# Patient Record
Sex: Female | Born: 1950 | Race: White | Hispanic: No | Marital: Married | Smoking: Former smoker
Health system: Southern US, Community
[De-identification: ages and names within clinical notes are randomized; demographics above are authoritative.]

## PROBLEM LIST (undated history)

## (undated) DIAGNOSIS — I493 Ventricular premature depolarization: Secondary | ICD-10-CM

## (undated) DIAGNOSIS — H1851 Endothelial corneal dystrophy: Secondary | ICD-10-CM

## (undated) DIAGNOSIS — H269 Unspecified cataract: Secondary | ICD-10-CM

## (undated) DIAGNOSIS — H18519 Endothelial corneal dystrophy, unspecified eye: Secondary | ICD-10-CM

## (undated) DIAGNOSIS — E785 Hyperlipidemia, unspecified: Secondary | ICD-10-CM

## (undated) DIAGNOSIS — R011 Cardiac murmur, unspecified: Secondary | ICD-10-CM

## (undated) DIAGNOSIS — T7840XA Allergy, unspecified, initial encounter: Secondary | ICD-10-CM

## (undated) DIAGNOSIS — C801 Malignant (primary) neoplasm, unspecified: Secondary | ICD-10-CM

## (undated) DIAGNOSIS — I4729 Other ventricular tachycardia: Secondary | ICD-10-CM

## (undated) DIAGNOSIS — K219 Gastro-esophageal reflux disease without esophagitis: Secondary | ICD-10-CM

## (undated) DIAGNOSIS — I472 Ventricular tachycardia: Secondary | ICD-10-CM

## (undated) DIAGNOSIS — B009 Herpesviral infection, unspecified: Secondary | ICD-10-CM

## (undated) DIAGNOSIS — I351 Nonrheumatic aortic (valve) insufficiency: Secondary | ICD-10-CM

## (undated) DIAGNOSIS — I341 Nonrheumatic mitral (valve) prolapse: Secondary | ICD-10-CM

## (undated) DIAGNOSIS — Z8659 Personal history of other mental and behavioral disorders: Secondary | ICD-10-CM

## (undated) DIAGNOSIS — K635 Polyp of colon: Secondary | ICD-10-CM

## (undated) DIAGNOSIS — M1990 Unspecified osteoarthritis, unspecified site: Secondary | ICD-10-CM

## (undated) DIAGNOSIS — K5790 Diverticulosis of intestine, part unspecified, without perforation or abscess without bleeding: Secondary | ICD-10-CM

## (undated) DIAGNOSIS — F419 Anxiety disorder, unspecified: Secondary | ICD-10-CM

## (undated) DIAGNOSIS — O24419 Gestational diabetes mellitus in pregnancy, unspecified control: Secondary | ICD-10-CM

## (undated) HISTORY — DX: Nonrheumatic mitral (valve) prolapse: I34.1

## (undated) HISTORY — DX: Other ventricular tachycardia: I47.29

## (undated) HISTORY — PX: POLYPECTOMY: SHX149

## (undated) HISTORY — PX: COLONOSCOPY: SHX174

## (undated) HISTORY — DX: Hyperlipidemia, unspecified: E78.5

## (undated) HISTORY — DX: Unspecified cataract: H26.9

## (undated) HISTORY — PX: WISDOM TOOTH EXTRACTION: SHX21

## (undated) HISTORY — DX: Polyp of colon: K63.5

## (undated) HISTORY — DX: Unspecified osteoarthritis, unspecified site: M19.90

## (undated) HISTORY — DX: Herpesviral infection, unspecified: B00.9

## (undated) HISTORY — DX: Anxiety disorder, unspecified: F41.9

## (undated) HISTORY — DX: Endothelial corneal dystrophy: H18.51

## (undated) HISTORY — DX: Endothelial corneal dystrophy, unspecified eye: H18.519

## (undated) HISTORY — DX: Cardiac murmur, unspecified: R01.1

## (undated) HISTORY — DX: Ventricular premature depolarization: I49.3

## (undated) HISTORY — DX: Malignant (primary) neoplasm, unspecified: C80.1

## (undated) HISTORY — DX: Allergy, unspecified, initial encounter: T78.40XA

## (undated) HISTORY — DX: Gastro-esophageal reflux disease without esophagitis: K21.9

## (undated) HISTORY — DX: Personal history of other mental and behavioral disorders: Z86.59

## (undated) HISTORY — DX: Nonrheumatic aortic (valve) insufficiency: I35.1

## (undated) HISTORY — PX: MANDIBLE SURGERY: SHX707

## (undated) HISTORY — PX: KNEE SURGERY: SHX244

## (undated) HISTORY — DX: Ventricular tachycardia: I47.2

## (undated) HISTORY — DX: Diverticulosis of intestine, part unspecified, without perforation or abscess without bleeding: K57.90

## (undated) HISTORY — DX: Gestational diabetes mellitus in pregnancy, unspecified control: O24.419

---

## 1997-10-28 ENCOUNTER — Other Ambulatory Visit: Admission: RE | Admit: 1997-10-28 | Discharge: 1997-10-28 | Payer: Self-pay | Admitting: Obstetrics and Gynecology

## 2001-02-14 ENCOUNTER — Other Ambulatory Visit: Admission: RE | Admit: 2001-02-14 | Discharge: 2001-02-14 | Payer: Self-pay | Admitting: *Deleted

## 2001-10-30 ENCOUNTER — Encounter: Payer: Self-pay | Admitting: Internal Medicine

## 2002-05-07 ENCOUNTER — Other Ambulatory Visit: Admission: RE | Admit: 2002-05-07 | Discharge: 2002-05-07 | Payer: Self-pay | Admitting: Internal Medicine

## 2004-05-21 ENCOUNTER — Other Ambulatory Visit: Admission: RE | Admit: 2004-05-21 | Discharge: 2004-05-21 | Payer: Self-pay | Admitting: Internal Medicine

## 2006-07-25 ENCOUNTER — Other Ambulatory Visit: Admission: RE | Admit: 2006-07-25 | Discharge: 2006-07-25 | Payer: Self-pay | Admitting: Internal Medicine

## 2007-03-06 ENCOUNTER — Ambulatory Visit: Payer: Self-pay | Admitting: Internal Medicine

## 2007-11-20 ENCOUNTER — Ambulatory Visit (HOSPITAL_COMMUNITY): Admission: RE | Admit: 2007-11-20 | Discharge: 2007-11-20 | Payer: Self-pay | Admitting: Internal Medicine

## 2008-02-28 ENCOUNTER — Ambulatory Visit: Payer: Self-pay | Admitting: Internal Medicine

## 2008-03-13 ENCOUNTER — Encounter: Payer: Self-pay | Admitting: Internal Medicine

## 2008-03-13 ENCOUNTER — Ambulatory Visit: Payer: Self-pay | Admitting: Internal Medicine

## 2008-03-14 ENCOUNTER — Encounter: Payer: Self-pay | Admitting: Internal Medicine

## 2008-08-22 ENCOUNTER — Other Ambulatory Visit: Admission: RE | Admit: 2008-08-22 | Discharge: 2008-08-22 | Payer: Self-pay | Admitting: Internal Medicine

## 2009-09-30 ENCOUNTER — Encounter: Payer: Self-pay | Admitting: Internal Medicine

## 2009-10-01 ENCOUNTER — Ambulatory Visit (HOSPITAL_COMMUNITY): Admission: RE | Admit: 2009-10-01 | Discharge: 2009-10-01 | Payer: Self-pay | Admitting: Internal Medicine

## 2009-11-14 DIAGNOSIS — Z860100 Personal history of colon polyps, unspecified: Secondary | ICD-10-CM | POA: Insufficient documentation

## 2009-11-14 DIAGNOSIS — R1011 Right upper quadrant pain: Secondary | ICD-10-CM | POA: Insufficient documentation

## 2009-11-14 DIAGNOSIS — K5730 Diverticulosis of large intestine without perforation or abscess without bleeding: Secondary | ICD-10-CM | POA: Insufficient documentation

## 2009-11-19 ENCOUNTER — Ambulatory Visit: Payer: Self-pay | Admitting: Internal Medicine

## 2009-11-21 ENCOUNTER — Ambulatory Visit (HOSPITAL_COMMUNITY): Admission: RE | Admit: 2009-11-21 | Discharge: 2009-11-21 | Payer: Self-pay | Admitting: Internal Medicine

## 2009-11-26 ENCOUNTER — Telehealth: Payer: Self-pay | Admitting: Internal Medicine

## 2010-01-04 ENCOUNTER — Emergency Department (HOSPITAL_COMMUNITY): Admission: EM | Admit: 2010-01-04 | Discharge: 2010-01-04 | Payer: Self-pay | Admitting: Emergency Medicine

## 2010-02-06 ENCOUNTER — Ambulatory Visit: Payer: Self-pay | Admitting: Cardiovascular Disease

## 2010-04-02 ENCOUNTER — Ambulatory Visit: Payer: Self-pay | Admitting: Cardiovascular Disease

## 2010-07-16 NOTE — Progress Notes (Signed)
Summary: Triage-abd.pain   Phone Note Call from Patient Call back at Home Phone [phone]   Caller: Patient Call For: Dr. Juanda Chance Reason for Call: Talk to Nurse Summary of Call: pt. said over the weekend she was constipated. now it is diarrhea with abd. pain Initial call taken by: Karna Christmas,  November 26, 2009 10:48 AM  Follow-up for Phone Call        Last OV 11-19-09.  Pt. states she began with abd. pain on Sunday. Pain is "Almost to my hip bone on the left side."  Pt. took an ASA, was concerned the pain was connected to her heart. When she pushes the area the pain is worse. This morning the pain was worse and she had urge to have a BM, the stool was watery X3. "The pain feels like it is moving down my intestine track."  Denies fever, blood, black stools.   Stools "Are not a normal color for me, they are sorta yellow."  Pt. also feels she is having heart palpitations, she has been instructed to call her PCP/Cardiologist ASAP about heart concerns.  DR.Exander Shaul PLEASE ADVISE   Follow-up by: Laureen Ochs LPN,  November 26, 2009 11:39 AM  Additional Follow-up for Phone Call Additional follow up Details #1::        Please set her up for CT scan of the abd/pelvis with oral and IV contrast attn: RLQ abd.pain. Additional Follow-up by: Hart Carwin MD,  November 26, 2009 1:13 PM    Additional Follow-up for Phone Call Additional follow up Details #2::    Above MD orders reviewed with patient. Pt. declines the CT, states, "I have diarrhea now, I think I just have a virus, just see what Dr.Alexus Galka thinks."   I have instructed pt. to stay on full liquids for 24 hours and then slowly advance her diet and use Immodium as needed for diarrhea. I will call pt., if new orders, after MD reviews.  DR.Zen Felling PLEASE ADVISE  Follow-up by: Laureen Ochs LPN,  November 26, 2009 3:04 PM  Additional Follow-up for Phone Call Additional follow up Details #3:: Details for Additional Follow-up Action Taken: It is OK for  her to hold off on CT scan if she is better. Call us as needed. Additional Follow-up by: Hart Carwin MD,  November 27, 2009 8:16 AM   Above MD orders reviewed with patient. Pt. states she began with respiratory symptoms last night, she spoke with her PCP and she will call us back as needed. Laureen Ochs LPN  November 27, 2009 8:29 AM

## 2010-07-16 NOTE — Assessment & Plan Note (Signed)
Summary: Gastroenterology  Birdie Hopes MR#:  [id number] Page #  [facility]   GASTROENTEROLOGY CONSULTATION  NAME:  Jean Smith, Jean Smith  OFFICE NO:  [id number]  DATE:  10/30/01   REQUESTING PHYSICIAN:  Dr. Marisue Brooklyn  PROBLEM:   1.   Family history of colon cancer. 2.   Acute diarrheal illness.  HISTORY OF PRESENT ILLNESS:  The patient is a pleasant, generally healthy white female who initially was referred for screening due to family history of colon cancer in her mother.  The patient says that her mother was diagnosed in her early 62s.  The patient had not been having any changes in her bowel habits, problems with diarrhea or constipation, melena or hematochezia.  Over the past 12 days the patient has been acutely ill with diarrhea and lower abdominal cramping.  She says at her worst, she was having up to 12 bowel movements per day and is currently having 2 to 3 bowel movements per day.  She has not noticed any melena or hematochezia.  No fever or chills and again, no nausea or vomiting.  She says she is worrying about some sort of organism, as two of her children have also been sick and they had all eaten a chicken salad from a restaurant on a couple of occasions.  She says her son is currently home from school today, sick with diarrhea and severe cramping.  She has been taking Imodium on a p.r.n. basis and feels that she has actually been better over the past few days.  She is keeping down solid food.  She has not had any significant weight loss with this illness and says that her appetite is okay.  She has city water at home.  No foreign travel and had last taken an antibiotic in March, a Z-Pak for bronchitis.  CURRENT MEDICATIONS:  Wellbutrin 150 q.d. and topical Flagyl for rosacea.  PAST MEDICAL HISTORY:  C-section x 2, right knee surgery and a right jaw repair for TMJ.    SOCIAL HISTORY:  The patient is a Psychologist, sport and exercise.  She is married and has three children.  She is a nonsmoker.   Drinks alcohol rarely.  FAMILY HISTORY:  As outlined above.  Pertinent for colon cancer in the patient's mother, diagnosed in her early 40s.  Maternal grandmother also died from cancer with liver mets, they are not sure that she may have had colon cancer as her primary.  Father with history of heart disease.  REVIEW OF SYSTEMS:  Reviewed and negative other than described above.  PHYSICAL EXAMINATION:  GENERAL:  A well-developed, healthy-appearing white female in no acute distress.  VITAL SIGNS:  Blood pressure 110/80.  Weight is 138 pounds.  CARDIOVASCULAR:  Regular rate and rhythm with S1 and S2.  No murmur, gallop or rub.  PULMONARY:  Clear to A and P.  ABDOMEN:  Soft.  Bowel sounds are active.  She has mild, rather diffuse tenderness.  There is no mass or hepatosplenomegaly.  RECTAL:  Exam is not done today.    IMPRESSION:   73.   60 year old white female with a family history of colon cancer. 2.   Acute diarrheal illness x 12 days.  Suspect infectious, viral versus bacterial.  Appears to be resolving.  PLAN:   1.   Schedule colonoscopy in 6 to 8 weeks at the patient's convenience, once she is over her acute illness. 2.   Check stool for C & S, O & P, Giardia antigen and C.  difficile. 3.   Low-residue diet in the short term. 4.   Imodium p.r.n. 5.   Cipro 250 b.i.d. and Flagyl 250 t.i.d. empirically for 10 days.     Sammuel Cooper, P.A.-C. Hedwig Morton. Juanda Chance, M.D.   [id number] cc:  Dr. Marisue Brooklyn D:  10/30/01; T:  ; Job [phone]

## 2010-07-16 NOTE — Assessment & Plan Note (Signed)
Summary: RUQ Pain--ch.    History of Present Illness Visit Type: consult  Primary GI MD: Lina Sar MD Primary Provider: Marisue Brooklyn, DO  Requesting Provider: Marisue Brooklyn, DO  Chief Complaint: RLQ pain. Pt states that the pain has gone away and denies any GI complaints   History of Present Illness:   This is 60 year old white female with intermittent right lower quadrant abdominal discomfort. Her last episode lasted about 2 weeks and was worse when she was walking and was relieved by laying down. She denies any bulging or swelling in the right lower quadrant. There was no radiation of the pain to the leg. The pain started with back pain. She denies any change in her bowel habits. There is a family history of colon cancer in her mother and grandmother. Her last colonoscopy in September 2009 showed a hyperplastic polyp. She is due for a recall colonoscopy in September 2014.   GI Review of Systems    Reports abdominal pain.     Location of  Abdominal pain: RLQ.    Denies acid reflux, belching, bloating, chest pain, dysphagia with liquids, dysphagia with solids, heartburn, loss of appetite, nausea, vomiting, vomiting blood, weight loss, and  weight gain.        Denies anal fissure, black tarry stools, change in bowel habit, constipation, diarrhea, diverticulosis, fecal incontinence, heme positive stool, hemorrhoids, irritable bowel syndrome, jaundice, light color stool, liver problems, rectal bleeding, and  rectal pain.    Current Medications (verified): 1)  Krill Oil 1000 Mg Caps (Krill Oil) .... One Capsule By Mouth Once Daily 2)  Vitamin E 600 Unit  Caps (Vitamin E) .... One Tablet By Mouth Once Daily 3)  Calcium-Magnesium 500-250 Mg Tabs (Calcium-Magnesium) .... One Tablet By Mouth Once Daily 4)  Vitamin C 500 Mg  Tabs (Ascorbic Acid) .... One Tablet By Mouth Once Daily 5)  Flax Seed Oil 1000 Mg Caps (Flaxseed (Linseed)) .... One Capsule Once Daily  Allergies (verified): No Known  Drug Allergies  Past History:  Past Medical History: Reviewed history from 11/14/2009 and no changes required. Current Problems:  ABDOMINAL PAIN RIGHT UPPER QUADRANT (ICD-789.01) COLONIC POLYPS, HYPERPLASTIC, HX OF (ICD-V12.72) DIVERTICULOSIS, COLON (ICD-562.10) Family Hx of COLON CANCER (ICD-153.9)    Past Surgical History: Reviewed history from 11/14/2009 and no changes required. Right Jaw Surgery Right Knee Surgery C-Section x 3  Family History: Reviewed history from 11/14/2009 and no changes required. Family History of Colon Cancer: Mother, Grandmother Family History of Colon Polyps: Sister Family History of Heart Disease: Father  Social History: Unemployed Married Alcohol Use - yes-rare Illicit Drug Use - no Patient is a former smoker: quit 30 yrs ago  Daily Caffeine Use: Black Tea and Herbal Tea Daily  Smoking Status:  quit  Review of Systems       The patient complains of allergy/sinus.  The patient denies anemia, anxiety-new, arthritis/joint pain, back pain, blood in urine, breast changes/lumps, change in vision, confusion, cough, coughing up blood, depression-new, fainting, fatigue, fever, headaches-new, hearing problems, heart murmur, heart rhythm changes, itching, menstrual pain, muscle pains/cramps, night sweats, nosebleeds, pregnancy symptoms, shortness of breath, skin rash, sleeping problems, sore throat, swelling of feet/legs, swollen lymph glands, thirst - excessive , urination - excessive , urination changes/pain, urine leakage, vision changes, and voice change.         Pertinent positive and negative review of systems were noted in the above HPI. All other ROS was otherwise negative.   Vital Signs:  Patient profile:  60 year old female Height:      64 inches Weight:      154 pounds BMI:     26.53 BSA:     1.75 Pulse rate:   68 / minute Pulse rhythm:   regular BP sitting:   120 / 74  (left arm) Cuff size:   regular  Vitals Entered By: Ok Anis  CMA (November 19, 2009 10:17 AM)  Physical Exam  General:  Well developed, well nourished, no acute distress. Eyes:  PERRLA, no icterus. Mouth:  No deformity or lesions, dentition normal. Neck:  Supple; no masses or thyromegaly. Lungs:  Clear throughout to auscultation. Heart:  Regular rate and rhythm; no murmurs, rubs,  or bruits. Abdomen:  soft abdomen without mild tenderness at the right pelvic area and right lower quadrant. On standing, there was no evidence of a hernia. There was no inguinal adenopathy. There was no palpable mass or fullness in right lower quadrant. Straight leg raising was negative. Extremities:  No clubbing, cyanosis, edema or deformities noted. Skin:  Intact without significant lesions or rashes. Psych:  Alert and cooperative. Normal mood and affect.   Impression & Recommendations:  Problem # 1:  ABDOMINAL PAIN RIGHT UPPER QUADRANT (ICD-789.01) actually, her pain was in  right lower quadrant and might have been related to either functional problems or due to musculoskeletal origin.. It was relieved by laying down which would raise the question of a hernia; either femoral or inguinal hernia. Another possibility, would be of kidney stones but her urinalysis was normal. The pain has now gone away. Gynecologic problems would be a possibility as well. She is postmenopausal. We will proceed with a pelvic ultrasound with vaginal probe, paying close attention to the right adnexa. She is up-to-date on her colonoscopy and she is Hemoccult negative. Another possibility would be to obtain a CT scan of the pelvis with attention to the right lower quadrant to look for any anatomic abnormalities.if her pain recurrs. Orders: GI Misc Procedure/ Radiology Order (GI Misc )  Problem # 2:  COLONIC POLYPS, HYPERPLASTIC, HX OF (ICD-V12.72) Patient is due for a recall colonoscopy in September 2014.  Patient Instructions: 1)  pelvic and intravaginal ultrasound with attention to right lower  quadrant. 2)  Continue probiotics and high-fiber diet. 3)  Call us when the pain recurs and we will proceed with CT scan of the pelvis to look for hernia. 4)  Recall colonoscopy September 2014. 5)  Copy sent to : Dr Marisue Brooklyn 6)  The medication list was reviewed and reconciled.  All changed / newly prescribed medications were explained.  A complete medication list was provided to the patient / caregiver.

## 2010-08-19 ENCOUNTER — Ambulatory Visit (INDEPENDENT_AMBULATORY_CARE_PROVIDER_SITE_OTHER): Payer: BC Managed Care – PPO | Admitting: Nurse Practitioner

## 2010-08-19 DIAGNOSIS — R002 Palpitations: Secondary | ICD-10-CM

## 2010-08-19 DIAGNOSIS — I4949 Other premature depolarization: Secondary | ICD-10-CM

## 2010-08-29 LAB — CBC
Hemoglobin: 13.4 g/dL (ref 12.0–15.0)
MCHC: 34.3 g/dL (ref 30.0–36.0)
MCV: 88.2 fL (ref 78.0–100.0)
RBC: 4.44 MIL/uL (ref 3.87–5.11)
RDW: 14.4 % (ref 11.5–15.5)
WBC: 7.2 10*3/uL (ref 4.0–10.5)

## 2010-08-29 LAB — DIFFERENTIAL
Basophils Absolute: 0 10*3/uL (ref 0.0–0.1)
Eosinophils Absolute: 0.1 10*3/uL (ref 0.0–0.7)
Eosinophils Relative: 1 % (ref 0–5)
Lymphocytes Relative: 29 % (ref 12–46)
Neutro Abs: 4.6 10*3/uL (ref 1.7–7.7)

## 2010-08-29 LAB — BASIC METABOLIC PANEL
Calcium: 9.7 mg/dL (ref 8.4–10.5)
GFR calc Af Amer: >60 mL/min (ref >60)
GFR calc non Af Amer: >60 mL/min (ref >60)
Glucose, Bld: 84 mg/dL (ref 70–99)

## 2010-08-29 LAB — CK TOTAL AND CKMB (NOT AT ARMC)
CK, MB: 2.7 ng/mL (ref 0.3–4.0)
Relative Index: 1.9 (ref 0.0–2.5)

## 2010-08-29 LAB — TROPONIN I: Troponin I: 0.03 ng/mL (ref 0.00–0.06)

## 2010-09-14 ENCOUNTER — Encounter: Payer: Self-pay | Admitting: Cardiovascular Disease

## 2010-09-14 ENCOUNTER — Ambulatory Visit (INDEPENDENT_AMBULATORY_CARE_PROVIDER_SITE_OTHER): Payer: BC Managed Care – PPO | Admitting: Cardiovascular Disease

## 2010-09-14 VITALS — BP 120/80 | HR 88 | Wt 148.0 lb

## 2010-09-14 DIAGNOSIS — R002 Palpitations: Secondary | ICD-10-CM | POA: Insufficient documentation

## 2010-09-14 NOTE — Assessment & Plan Note (Signed)
Jean Smith seems to be doing fairly well. Her palpitations are most likely due to stress. She also has mitral valve prolapse. I've encouraged her to continue with her same medications. I'll see her again in 6 months.

## 2010-09-14 NOTE — Progress Notes (Signed)
History of Present Illness:  Jean Smith is a middle-age female with a history of mitral valve prolapse and palpitations. She has done fairly well. She has been under lots of stress recently. She has An autistic son who Is causing lots of this stress. He recently bought a house and paid cash.  She's been worried about all the aspects that come with Home ownership  Current Outpatient Prescriptions  Medication Sig Dispense Refill  . ALPRAZolam (XANAX) 1 MG tablet Take 1 mg by mouth 3 (three) times daily as needed.        . calcium carbonate 200 MG capsule Take 250 mg by mouth 2 (two) times daily with a meal.        . KRILL OIL PO Take by mouth.        . loratadine (CLARITIN) 10 MG tablet Take 10 mg by mouth as needed.        . Multiple Vitamin (MULTIVITAMIN) capsule Take 1 capsule by mouth daily.        . Cholecalciferol ([employer]S VIT D 5000 HIGH-POTENCY PO) Take by mouth 2 (two) times daily.        . magnesium 30 MG tablet Take 30 mg by mouth as needed.        . propranolol (INDERAL) 10 MG tablet Take 10 mg by mouth 3 (three) times daily.          Allergies  Allergen Reactions  . Crestor (Rosuvastatin Calcium)     Muscle ache  . Lipitor (Atorvastatin Calcium)     Muscle ache    Past Medical History  Diagnosis Date  . Chest pain   . Anxiety   . Mitral valve prolapse   . Hyperlipidemia   . PVC's (premature ventricular contractions)     History reviewed. No pertinent past surgical history.  History  Smoking status  . Former Smoker  . Quit date: 06/15/1983  Smokeless tobacco  . Not on file    History  Alcohol Use  . Yes    History reviewed. No pertinent family history.  Reviw of Systems:   The patient denies any heat or cold intolerance.  No weight gain or weight loss.  The patient denies headaches or blurry vision.  There is no cough or sputum production.  The patient denies dizziness.  There is no hematuria or hematochezia.  The patient denies any muscle aches or arthritis.  The  patient denies any rash.  The patient denies frequent falling or instability.  There is no history of depression or anxiety.  All other systems were reviewed and are negative.  Physical Exam: BP 120/80  Pulse 88  Wt 148 lb (67.132 kg) The patient is alert and oriented x 3.  The mood and affect are normal.  The skin is warm and dry.  Color is normal.  The HEENT exam reveals that the sclera are nonicteric.  The mucous membranes are moist.  The carotids are 2+ without bruits.  There is no thyromegaly.  There is no JVD.  The lungs are clear.  The chest wall is non tender.  The heart exam reveals a regular rate with a normal S1 and S2.  She has a soft midsystolic click.  The PMI is not displaced.   Abdominal exam reveals good bowel sounds.  There is no guarding or rebound.  There is no hepatosplenomegaly or tenderness.  There are no masses.  Exam of the legs reveal no clubbing, cyanosis, or edema.  The legs are without rashes.  The distal pulses are intact.  Cranial nerves II - XII are intact.  Motor and sensory functions are intact.  The gait is normal.  Assessment / Plan:

## 2011-04-12 ENCOUNTER — Encounter: Payer: Self-pay | Admitting: Cardiovascular Disease

## 2011-07-21 LAB — HM MAMMOGRAPHY: HM Mammogram: NEGATIVE

## 2012-02-18 LAB — BASIC METABOLIC PANEL
BUN: 12 mg/dL (ref 4–21)
Creatinine: 0.8 mg/dL (ref 0.5–1.1)
Glucose: 98 mg/dL
Potassium: 4.9 mmol/L (ref 3.4–5.3)
Sodium: 143 mmol/L (ref 137–147)

## 2012-02-18 LAB — LIPID PANEL: LDL Cholesterol: 189 mg/dL

## 2012-02-18 LAB — HEPATIC FUNCTION PANEL
ALT: 12 U/L (ref 7–35)
AST: 16 U/L (ref 13–35)
Bilirubin, Total: 0.5 mg/dL

## 2012-02-21 ENCOUNTER — Encounter: Payer: Self-pay | Admitting: Cardiovascular Disease

## 2012-03-20 ENCOUNTER — Telehealth: Payer: Self-pay | Admitting: Cardiovascular Disease

## 2012-03-20 ENCOUNTER — Encounter: Payer: Self-pay | Admitting: Cardiovascular Disease

## 2012-03-20 NOTE — Telephone Encounter (Signed)
Pt c/o Sunday having dizziness after a 4 mile walk, she felt she was probably dehydrated. Today she has chest heaviness " like just before a cold" she has some phlegm she is bringing up too. She is concerned because of the dizziness but states she was to drink V-8 daily with a banana daily  But said she has not been keeping up with that. Made app tomorrow and advised she get a bp/p, go to urgent care if worsens, pt agreed to plan.

## 2012-03-20 NOTE — Telephone Encounter (Signed)
New Problem:    Patient called in because she feels like her heart rate has sped up and she feels dizzy.  Patient walked on Sunday and felt sick because she didn't have enough water.  Please call back.

## 2012-03-21 ENCOUNTER — Ambulatory Visit: Payer: BC Managed Care – PPO | Admitting: Cardiovascular Disease

## 2012-03-30 ENCOUNTER — Encounter: Payer: Self-pay | Admitting: Cardiovascular Disease

## 2012-04-25 ENCOUNTER — Ambulatory Visit: Payer: BC Managed Care – PPO | Admitting: Cardiovascular Disease

## 2012-05-17 ENCOUNTER — Ambulatory Visit: Payer: BC Managed Care – PPO | Admitting: Cardiovascular Disease

## 2012-07-05 ENCOUNTER — Ambulatory Visit: Payer: BC Managed Care – PPO | Admitting: Cardiovascular Disease

## 2012-08-07 ENCOUNTER — Ambulatory Visit: Payer: BC Managed Care – PPO | Admitting: Cardiovascular Disease

## 2012-08-15 ENCOUNTER — Encounter: Payer: Self-pay | Admitting: Cardiovascular Disease

## 2012-10-16 LAB — TSH: TSH: 2.3 u[IU]/mL (ref 0.41–5.90)

## 2012-11-22 LAB — TSH: TSH: 2.07 u[IU]/mL (ref 0.41–5.90)

## 2012-11-23 ENCOUNTER — Ambulatory Visit (INDEPENDENT_AMBULATORY_CARE_PROVIDER_SITE_OTHER): Payer: BC Managed Care – PPO | Admitting: Emergency Medicine

## 2012-11-23 VITALS — BP 149/90 | HR 85 | Temp 97.9°F | Resp 16 | Ht 63.5 in | Wt 151.0 lb

## 2012-11-23 DIAGNOSIS — R350 Frequency of micturition: Secondary | ICD-10-CM

## 2012-11-23 DIAGNOSIS — R42 Dizziness and giddiness: Secondary | ICD-10-CM

## 2012-11-23 DIAGNOSIS — IMO0001 Reserved for inherently not codable concepts without codable children: Secondary | ICD-10-CM

## 2012-11-23 DIAGNOSIS — I4949 Other premature depolarization: Secondary | ICD-10-CM

## 2012-11-23 DIAGNOSIS — I493 Ventricular premature depolarization: Secondary | ICD-10-CM

## 2012-11-23 LAB — POCT URINALYSIS DIPSTICK
Bilirubin, UA: NEGATIVE
Glucose, UA: NEGATIVE
Protein, UA: NEGATIVE
Spec Grav, UA: <=1.005
pH, UA: 6

## 2012-11-23 LAB — POCT UA - MICROSCOPIC ONLY
Casts, Ur, LPF, POC: NEGATIVE
Crystals, Ur, HPF, POC: NEGATIVE
Yeast, UA: NEGATIVE

## 2012-11-23 MED ORDER — METOPROLOL SUCCINATE ER 50 MG PO TB24: 50.0000 mg | ORAL_TABLET | Freq: Every day | ORAL | Status: DC

## 2012-11-23 NOTE — Progress Notes (Signed)
[facility] [address] [place] [address] [phone]  Date:  11/23/2012   Name:  Jean Smith   DOB:  [birth year]   MRN:  [id number]  PCP:  No PCP Per Patient    Chief Complaint: Dizziness, Urinary Frequency and Generalized Body Aches   History of Present Illness:  Jean Smith is a 62 y.o. very pleasant female patient who presents with the following:  Has been evaluated for irregular heart beat by FMD and cardiologist with nuclear medicine stress tests twice.  She has a several week history of intermittent sensation of irregular heart beats and dizziness. She has been given inderal 10 to take prn. Arrhythmia.  She had a physical a month ago and was concerned that she may have diabetes as she has frequency and urgency.  She saw her FMD yesterday and a urine was done but she won't have a result until Monday.  She has no chest pain, nausea, vomiting, no cough or coryza.  No fever or chills.  No improvement with over the counter medications or other home remedies. Denies other complaint or health concern today.    Patient Active Problem List   Diagnosis Date Noted  . Palpitations 09/14/2010  . DIVERTICULOSIS, COLON 11/14/2009  . ABDOMINAL PAIN RIGHT UPPER QUADRANT 11/14/2009  . COLONIC POLYPS, HYPERPLASTIC, HX OF 11/14/2009    Past Medical History  Diagnosis Date  . Chest pain   . Anxiety   . Mitral valve prolapse   . Hyperlipidemia   . PVC's (premature ventricular contractions)     History reviewed. No pertinent past surgical history.  History  Substance Use Topics  . Smoking status: Former Smoker    Quit date: 06/15/1983  . Smokeless tobacco: Not on file  . Alcohol Use: Yes    History reviewed. No pertinent family history.  Allergies  Allergen Reactions  . Crestor (Rosuvastatin Calcium)     Muscle ache  . Lipitor (Atorvastatin Calcium)     Muscle ache    Medication list has been reviewed and updated.  Current Outpatient  Prescriptions on File Prior to Visit  Medication Sig Dispense Refill  . ALPRAZolam (XANAX) 1 MG tablet Take 1 mg by mouth 3 (three) times daily as needed.        . Cholecalciferol (CVS VIT D 5000 HIGH-POTENCY PO) Take by mouth 2 (two) times daily.        Marland Kitchen KRILL OIL PO Take by mouth.        . loratadine (CLARITIN) 10 MG tablet Take 10 mg by mouth as needed.        . Multiple Vitamin (MULTIVITAMIN) capsule Take 1 capsule by mouth daily.        . propranolol (INDERAL) 10 MG tablet Take 10 mg by mouth 3 (three) times daily.        . calcium carbonate 200 MG capsule Take 250 mg by mouth 2 (two) times daily with a meal.        . magnesium 30 MG tablet Take 30 mg by mouth as needed.         No current facility-administered medications on file prior to visit.    Review of Systems:  As per HPI, otherwise negative.    Physical Examination: Filed Vitals:   11/23/12 1844  BP: 149/90  Pulse: 85  Temp: 97.9 F (36.6 C)  Resp: 16   Filed Vitals:   11/23/12 1844  Height: 5' 3.5" (1.613 m)  Weight:  151 lb (68.493 kg)   Body mass index is 26.33 kg/(m^2). Ideal Body Weight: Weight in (lb) to have BMI = 25: 143.1  GEN: WDWN, NAD, Non-toxic, A & O x 3 HEENT: Atraumatic, Normocephalic. Neck supple. No masses, No LAD. Ears and Nose: No external deformity. CV: regular rhythm with intermittent palpitations, No M/G/R. No JVD. No thrill. No extra heart sounds. PULM: CTA B, no wheezes, crackles, rhonchi. No retractions. No resp. distress. No accessory muscle use. ABD: S, NT, ND, +BS. No rebound. No HSM. EXTR: No c/c/e NEURO Normal gait.  PSYCH: Normally interactive. Conversant. Not depressed or anxious appearing.  Calm demeanor.    Assessment and Plan: PVC's  Anxiety toprol xl 50   Signed,  Phillips Odor, MD   Results for orders placed in visit on 11/23/12  POCT URINALYSIS DIPSTICK      Result Value Range   Color, UA yellow     Clarity, UA clear     Glucose, UA neg      Bilirubin, UA neg     Ketones, UA neg     Spec Grav, UA <=1.005     Blood, UA neg     pH, UA 6.0     Protein, UA neg     Urobilinogen, UA 0.2     Nitrite, UA neg     Leukocytes, UA Trace    POCT UA - MICROSCOPIC ONLY      Result Value Range   WBC, Ur, HPF, POC 2-3     RBC, urine, microscopic 0-1     Bacteria, U Microscopic trace     Mucus, UA neg     Epithelial cells, urine per micros 1-2     Crystals, Ur, HPF, POC neg     Casts, Ur, LPF, POC neg     Yeast, UA neg

## 2012-11-23 NOTE — Patient Instructions (Addendum)
Premature Ventricular Contraction Premature ventricular contraction (PVC) is an irregularity of the heart rhythm involving extra or skipped heartbeats. In some cases, they may occur without obvious cause or heart disease. Other times, they can be caused by an electrolyte change in the blood. These need to be corrected. They can also be seen when there is not enough oxygen going to the heart. A common cause of this is plaque or cholesterol buildup. This buildup decreases the blood supply to the heart. In addition, extra beats may be caused or aggravated by:  Excessive smoking.  Alcohol consumption.  Caffeine.  Certain medications  Some street drugs. SYMPTOMS   The sensation of feeling your heart skipping a beat (palpitations).  In many cases, the person may have no symptoms. SIGNS AND TESTS   A physical examination may show an occasional irregularity, but if the PVC beats do not happen often, they may not be found on physical exam.  Blood pressure is usually normal.  Other tests that may find extra beats of the heart are:  An EKG (electrocardiogram)  A Holter monitor which can monitor your heart over longer periods of time  An Angiogram (study of the heart arteries). TREATMENT  Usually extra heartbeats do not need treatment. The condition is treated only if symptoms are severe or if extra beats are very frequent or are causing problems. An underlying cause, if discovered, may also require treatment.  Treatment may also be needed if there may be a risk for other more serious cardiac arrhythmias.  PREVENTION   Moderation in caffeine, alcohol, and tobacco use may reduce the risk of ectopic heartbeats in some people.  Exercise often helps people who lead a sedentary (inactive) lifestyle. PROGNOSIS  PVC heartbeats are generally harmless and do not need treatment.  RISKS AND COMPLICATIONS   Ventricular tachycardia (occasionally).  There usually are no complications.  Other  arrhythmias (occasionally). SEEK IMMEDIATE MEDICAL CARE IF:   You feel palpitations that are frequent or continual.  You develop chest pain or other problems such as shortness of breath, sweating, or nausea and vomiting.  You become light-headed or faint (pass out).  You get worse or do not improve with treatment. Document Released: 01/16/2004 Document Revised: 08/23/2011 Document Reviewed: 07/28/2007 [employer] Patient Information 2014 [employer].  

## 2012-11-24 ENCOUNTER — Telehealth: Payer: Self-pay | Admitting: Cardiovascular Disease

## 2012-11-24 NOTE — Telephone Encounter (Signed)
Left message to call back  

## 2012-11-24 NOTE — Telephone Encounter (Signed)
Spoke with patient she stated she thought the dizziness and PVC's she is having was coming from the Fluconazole that was given to her. Patient went to Urgent Care yesterday and per patient was told this should not be causing s/s.  Was given Rx for Metoprolol but has not started. Did advise patient should start as advised by MD. Patient has appointment scheduled with Dr Elease Hashimoto for 6/30, offered appointment for Monday am with PA Tereso Newcomer, unable to make that appointment. Next available appointment was given to patient. She wanted to know if Dr Elease Hashimoto had ever heard of this being a side effect or this medication possibly causing these symptoms. Will forward to [facility] and Dr Elease Hashimoto

## 2012-11-24 NOTE — Telephone Encounter (Signed)
New problem    Pt thinks she's having a reaction to fluconazole and wants to know if she should continue taking it

## 2012-11-25 LAB — URINE CULTURE

## 2012-11-27 ENCOUNTER — Telehealth: Payer: Self-pay

## 2012-11-27 NOTE — Telephone Encounter (Signed)
msg left, diflucan side effect include HA, dizziness, heart burn and abd pain as most common SE. Told her to call with further questions.

## 2012-11-27 NOTE — Telephone Encounter (Signed)
PT STATES SHE HAD A CULTURE DONE AND WOULD LIKE TO KNOW THE RESULTS. PLEASE CALL [phone]

## 2012-11-28 NOTE — Telephone Encounter (Signed)
Please review

## 2012-11-28 NOTE — Telephone Encounter (Signed)
Urine culture did not reveal infection

## 2012-11-28 NOTE — Telephone Encounter (Signed)
Pt notified. Still feels bad but has an appt with cardio tomorrow. Will f/u with [place] if needed.

## 2012-11-29 ENCOUNTER — Encounter: Payer: Self-pay | Admitting: Nurse Practitioner

## 2012-11-29 ENCOUNTER — Encounter: Payer: Self-pay | Admitting: *Deleted

## 2012-11-29 ENCOUNTER — Encounter (INDEPENDENT_AMBULATORY_CARE_PROVIDER_SITE_OTHER): Payer: BC Managed Care – PPO

## 2012-11-29 ENCOUNTER — Ambulatory Visit (INDEPENDENT_AMBULATORY_CARE_PROVIDER_SITE_OTHER): Payer: BC Managed Care – PPO | Admitting: Nurse Practitioner

## 2012-11-29 VITALS — BP 122/85 | HR 76 | Ht 64.0 in | Wt 150.0 lb

## 2012-11-29 DIAGNOSIS — R079 Chest pain, unspecified: Secondary | ICD-10-CM

## 2012-11-29 DIAGNOSIS — I493 Ventricular premature depolarization: Secondary | ICD-10-CM

## 2012-11-29 DIAGNOSIS — I4949 Other premature depolarization: Secondary | ICD-10-CM

## 2012-11-29 DIAGNOSIS — I341 Nonrheumatic mitral (valve) prolapse: Secondary | ICD-10-CM

## 2012-11-29 DIAGNOSIS — R42 Dizziness and giddiness: Secondary | ICD-10-CM

## 2012-11-29 DIAGNOSIS — I059 Rheumatic mitral valve disease, unspecified: Secondary | ICD-10-CM

## 2012-11-29 NOTE — Patient Instructions (Addendum)
We are going to place a 24 hour Holter monitor to watch your rhythm  We will set up a stress echo.   For now, stay on your current medicines and use the Inderal as needed.  Call the [facility] office at [phone] if you have any questions, problems or concerns.

## 2012-11-29 NOTE — Progress Notes (Signed)
Patient ID: Jean Smith, female   DOB: 15-Feb-1951, 62 y.o.   MRN: [id number] [facility] 24 Hour Holter monitor applied to patient.

## 2012-11-29 NOTE — Progress Notes (Signed)
Jean Smith Date of Birth: [birth year] Medical Record #[id number]  History of Present Illness: Jean Smith is seen back today for a work in visit. Seen for Dr. Elease Hashimoto. Not been seen since April of 2012. She has MVP and palpitations. Lots of reported stress from a son who is autistic. Normal stress echo from 2009 with normal LV function and MVP noted.   Called last week with dizziness and PVC's. Had been given Diflucan. Thought this was the cause of her symptoms. PVC's on EKG noted. Given metoprolol as well.   Diflucan can cause nausea, HA, abdominal pain, diarrhea, dyspepsia, dizziness and serious reactions include QT prolongation and torsades.   Comes in today. She is here alone. Says she has been "sick" and lightheaded for the last month or so. Had a normal physical in May. Normal labs. Had a skin problem and given prescription for Diflucan that she did not take. Was walking last week and felt "so bad" but not able to really define. Some intermittent chest pressure. Decreased exercise tolerance reported. Felt the PVC's more. Was thinking she had diabetes or a UTI. Went back to her primary provider - saw the NP. She tells me that the NP was upset with her for not taking the diflucan even though her skin problem had improved. She then took one dose. The following day was at the movies and had worsening PVC's. Had to leave. Went to Urgent Care. Negative evaluation. No UTI. Not diabetic. Tries to restrict her caffeine but likes chocolate and does drink tea. Had alcohol last night and this also makes it worse. She admits to lots of stress. Did not take the Metoprolol. She is worried about her copper and Mg levels.   Current Outpatient Prescriptions on File Prior to Visit  Medication Sig Dispense Refill  . ALPRAZolam (XANAX) 1 MG tablet Take 1 mg by mouth 3 (three) times daily as needed.        . calcium carbonate 200 MG capsule Take 250 mg by mouth 2 (two) times daily with a meal.        . KRILL  OIL PO Take by mouth.        . loratadine (CLARITIN) 10 MG tablet Take 10 mg by mouth as needed.        . magnesium 30 MG tablet Take 30 mg by mouth as needed.        . Multiple Vitamin (MULTIVITAMIN) capsule Take 1 capsule by mouth daily.        . propranolol (INDERAL) 10 MG tablet Take 10 mg by mouth as needed.        No current facility-administered medications on file prior to visit.    Allergies  Allergen Reactions  . Crestor (Rosuvastatin Calcium)     Muscle ache  . Lipitor (Atorvastatin Calcium)     Muscle ache    Past Medical History  Diagnosis Date  . Chest pain   . Anxiety   . Mitral valve prolapse   . Hyperlipidemia   . PVC's (premature ventricular contractions)     No past surgical history on file.  History  Smoking status  . Former Smoker  . Quit date: 06/15/1983  Smokeless tobacco  . Not on file    History  Alcohol Use  . Yes    No family history on file.  Review of Systems: The review of systems is per the HPI.  All other systems were reviewed and are negative.  Physical Exam: BP 122/85  Pulse 76  Ht 5\' 4"  (1.626 m)  Wt 150 lb (68.04 kg)  BMI 25.73 kg/m2 Patient is very pleasant and in no acute distress. Seems a little anxious to me. Skin is warm and dry. Color is normal.  HEENT is unremarkable. Normocephalic/atraumatic. PERRL. Sclera are nonicteric. Neck is supple. No masses. No JVD. Lungs are clear. Cardiac exam shows a regular rate and rhythm.No real murmur noted.  Abdomen is soft. Extremities are without edema. Gait and ROM are intact. No gross neurologic deficits noted.  LABORATORY DATA: EKG reviewed from Urgent Care and show sinus rhythm with PVCs. QT is normal.   Lab Results  Component Value Date   WBC 7.2 01/04/2010   HGB 13.4 01/04/2010   HCT 39.2 01/04/2010   PLT 194 01/04/2010   GLUCOSE 84 01/04/2010   NA 138 01/04/2010   K 3.7 01/04/2010   CL 105 01/04/2010   CREATININE 0.68 01/04/2010   BUN 9 01/04/2010   CO2 25 01/04/2010      Assessment / Plan: 1. Dizziness/PVC's/chest pressure - will arrange for 24 hour Holter. Her spells are happening daily. Will arrange for stress echo as well.   2. MVP - normal stress echo from 2009 - now with more symptoms - will update. Further disposition to follow. For now, I have left her on her current regimen with no changes. She is not wanting to take the Metoprolol.   Patient is agreeable to this plan and will call if any problems develop in the interim.   Rosalio Macadamia, RN, ANP-C [facility] [address] [place], [place]  [address]

## 2012-12-01 ENCOUNTER — Encounter (HOSPITAL_COMMUNITY): Payer: Self-pay

## 2012-12-01 ENCOUNTER — Emergency Department (HOSPITAL_COMMUNITY): Payer: BC Managed Care – PPO

## 2012-12-01 ENCOUNTER — Emergency Department (HOSPITAL_COMMUNITY)
Admission: EM | Admit: 2012-12-01 | Discharge: 2012-12-02 | Disposition: A | Discharge status: Home / Self Care | Payer: BC Managed Care – PPO | Attending: Emergency Medicine | Admitting: Emergency Medicine

## 2012-12-01 ENCOUNTER — Telehealth: Payer: Self-pay | Admitting: Cardiovascular Disease

## 2012-12-01 DIAGNOSIS — R079 Chest pain, unspecified: Secondary | ICD-10-CM

## 2012-12-01 DIAGNOSIS — Z87891 Personal history of nicotine dependence: Secondary | ICD-10-CM | POA: Insufficient documentation

## 2012-12-01 DIAGNOSIS — R55 Syncope and collapse: Secondary | ICD-10-CM | POA: Insufficient documentation

## 2012-12-01 DIAGNOSIS — I4949 Other premature depolarization: Secondary | ICD-10-CM | POA: Insufficient documentation

## 2012-12-01 DIAGNOSIS — I059 Rheumatic mitral valve disease, unspecified: Secondary | ICD-10-CM | POA: Insufficient documentation

## 2012-12-01 DIAGNOSIS — Z79899 Other long term (current) drug therapy: Secondary | ICD-10-CM | POA: Insufficient documentation

## 2012-12-01 DIAGNOSIS — I493 Ventricular premature depolarization: Secondary | ICD-10-CM

## 2012-12-01 DIAGNOSIS — F411 Generalized anxiety disorder: Secondary | ICD-10-CM | POA: Insufficient documentation

## 2012-12-01 DIAGNOSIS — E785 Hyperlipidemia, unspecified: Secondary | ICD-10-CM | POA: Insufficient documentation

## 2012-12-01 LAB — BASIC METABOLIC PANEL
Calcium: 9.7 mg/dL (ref 8.4–10.5)
GFR calc Af Amer: >90 mL/min (ref >90)
GFR calc non Af Amer: 89 mL/min — ABNORMAL LOW (ref >90)
Sodium: 140 mEq/L (ref 135–145)

## 2012-12-01 LAB — CBC WITH DIFFERENTIAL/PLATELET
Basophils Absolute: 0 10*3/uL (ref 0.0–0.1)
Basophils Relative: 0 % (ref 0–1)
Eosinophils Absolute: 0.1 10*3/uL (ref 0.0–0.7)
Eosinophils Relative: 2 % (ref 0–5)
MCH: 29.9 pg (ref 26.0–34.0)
MCV: 85.6 fL (ref 78.0–100.0)
Platelets: 222 10*3/uL (ref 150–400)
RDW: 13.5 % (ref 11.5–15.5)

## 2012-12-01 LAB — POCT I-STAT TROPONIN I: Troponin i, poc: 0.01 ng/mL (ref 0.00–0.08)

## 2012-12-01 NOTE — Telephone Encounter (Signed)
Will see her at next ov.  Increase intake of fluids

## 2012-12-01 NOTE — ED Provider Notes (Signed)
History     CSN: [id number]  Arrival date & time 12/01/12  2143   First MD Initiated Contact with Patient 12/01/12 2154      Chief Complaint  Patient presents with  . Chest Pain    (Consider location/radiation/quality/duration/timing/severity/associated sxs/prior treatment) Patient is a 62 y.o. female presenting with palpitations. The history is provided by the patient.  Palpitations Palpitations quality: states she feels extra beats with pauses.  Onset quality:  Gradual Timing:  Intermittent Progression:  Worsening Chronicity:  Recurrent Relieved by:  Nothing Worsened by:  Nothing tried Ineffective treatments:  None tried Associated symptoms: chest pressure (vaguley described chest pressure worse with eating. ) and nausea   Associated symptoms: no chest pain, no cough, no dizziness, no lower extremity edema, no numbness, no shortness of breath and no vomiting   Associated symptoms comment:  Feels faint but denies dizziness.    Past Medical History  Diagnosis Date  . Chest pain   . Anxiety   . Mitral valve prolapse   . Hyperlipidemia   . PVC's (premature ventricular contractions)     History reviewed. No pertinent past surgical history.  No family history on file.  History  Substance Use Topics  . Smoking status: Former Smoker    Quit date: 06/15/1983  . Smokeless tobacco: Not on file  . Alcohol Use: Yes    OB History   Grav Para Term Preterm Abortions TAB SAB Ect Mult Living                  Review of Systems  Constitutional: Negative for fever and chills.  HENT: Negative for congestion and rhinorrhea.   Eyes: Negative for pain, redness and visual disturbance.  Respiratory: Negative for cough, chest tightness and shortness of breath.   Cardiovascular: Positive for palpitations. Negative for chest pain.  Gastrointestinal: Positive for nausea. Negative for vomiting, abdominal pain and diarrhea.  Genitourinary: Negative for dysuria and difficulty  urinating.  Neurological: Positive for light-headedness. Negative for dizziness, weakness and numbness.  All other systems reviewed and are negative.    Allergies  Crestor and Lipitor  Home Medications   Current Outpatient Rx  Name  Route  Sig  Dispense  Refill  . ALPRAZolam (XANAX) 1 MG tablet   Oral   Take 1 mg by mouth 3 (three) times daily as needed.           . calcium carbonate 200 MG capsule   Oral   Take 250 mg by mouth 2 (two) times daily with a meal.           . KRILL OIL PO   Oral   Take by mouth.           . loratadine (CLARITIN) 10 MG tablet   Oral   Take 10 mg by mouth as needed.           . magnesium 30 MG tablet   Oral   Take 30 mg by mouth as needed.           . Multiple Vitamin (MULTIVITAMIN) capsule   Oral   Take 1 capsule by mouth daily.           . propranolol (INDERAL) 10 MG tablet   Oral   Take 10 mg by mouth as needed.            BP 144/79  Pulse 81  Temp(Src) 98.2 F (36.8 C) (Oral)  Resp 16  SpO2 97%  Physical Exam  Nursing note and vitals reviewed. Constitutional: She is oriented to person, place, and time. She appears well-developed and well-nourished. No distress.  HENT:  Head: Normocephalic and atraumatic.  Mouth/Throat: Oropharynx is clear and moist.  Eyes: EOM are normal. Pupils are equal, round, and reactive to light.  Neck: Normal range of motion. Neck supple.  Cardiovascular: Normal rate, regular rhythm and normal heart sounds.  Exam reveals no friction rub.   No murmur heard. Occasional extra beats heard.   Pulmonary/Chest: Effort normal and breath sounds normal. No respiratory distress. She has no wheezes. She has no rales.  Abdominal: Soft. There is no tenderness. There is no rebound and no guarding.  Musculoskeletal: Normal range of motion. She exhibits no edema and no tenderness.  Lymphadenopathy:    She has no cervical adenopathy.  Neurological: She is alert and oriented to person, place, and  time.  Skin: Skin is warm and dry. No rash noted.  Psychiatric: She has a normal mood and affect. Her behavior is normal.    ED Course  Procedures (including critical care time)  Labs Reviewed  BASIC METABOLIC PANEL - Abnormal; Notable for the following:    Glucose, Bld 104 (*)    GFR calc non Af Amer 89 (*)    All other components within normal limits  CBC WITH DIFFERENTIAL  URINALYSIS, ROUTINE W REFLEX MICROSCOPIC  POCT I-STAT TROPONIN I   Dg Chest 2 View  12/01/2012   *RADIOLOGY REPORT*  Clinical Data: Chest pain.  CHEST - 2 VIEW  Comparison: 01/04/2010.  Findings: The cardiac silhouette, mediastinal and hilar contours are normal and stable.  The lungs are clear acute process.  There are chronic bronchitic type interstitial changes.  No pleural effusion.  The bony thorax is intact.  IMPRESSION: No acute cardiopulmonary findings.   Original Report Authenticated By: Rudie Meyer, M.D.    Date: 12/01/2012  Rate: 75  Rhythm: normal sinus rhythm  QRS Axis: normal  Intervals: normal  ST/T Wave abnormalities: normal  Conduction Disutrbances:none  Narrative Interpretation:   Old EKG Reviewed: unchanged    1. Faint   2. Chest pain   3. PVC (premature ventricular contraction)       MDM  15:44 PM  62 year old female with a history of mitral valve prolapse and PVCs presenting with one month of "feeling faint" this gradually worsened. She states over the past 2 days that she is still feeling poorly including feeling more faint that is worse with bending over and intermittent chest pressure with nausea. She was concerned that the nausea might be a heart attack. She has seen her cardiologist 2 days ago. This time she was sent home with a Holter monitor given known history of PVCs. She is scheduled for stress test in 5 days. Intermittent PVCs are noted on the monitor. Vital signs are stable. She appears well. We'll check labs including electrolytes to rule out disturbance, troponin and  chest x-ray. EKG is unremarkable.  11:45 PM labs show no significant abnormality. Overall does not sound to be c/w ACS. She has f/u scheduled in 5 days for stress test and has holter monitor at home. Pt understanding of plan and dc'd home in stable condition.      Caren Hazy, MD 12/01/12 [phone]

## 2012-12-01 NOTE — Telephone Encounter (Signed)
Pt called into report blood pressure of 103/67.  Advised pt blood pressure is ok.

## 2012-12-01 NOTE — Telephone Encounter (Signed)
New problem   Pt has taken her b/p now and it's low-should she take propanolol

## 2012-12-01 NOTE — ED Notes (Signed)
Per EMS, pt. C/o feeling "odd" today. Seen by PCP for PVCs and has a stress test on Wednesday with cardiologist. Pt. Very anxious. Pt. Given 1 nitro and 4mg  zofran.

## 2012-12-01 NOTE — Telephone Encounter (Signed)
New Prob   Pt states she is really light headed and feel her BP may be low. Would like to speak to nurse.

## 2012-12-01 NOTE — ED Provider Notes (Signed)
I saw and evaluated the patient, reviewed the resident's note and I agree with the findings and plan.  Pt with atypical nausea without anginal type sx, workup here neg, pt has cards f/u scheduled  Toy Baker, MD 12/01/12 2340

## 2012-12-01 NOTE — Telephone Encounter (Signed)
Pt called  To report the same symptoms she has been having,  light headed, dizziness . Pt   thinks that her BP is low, pt is having light headedness when leaning over also  bending over when she is brushing her teeth. Pt was seen by Norma Fredrickson PA this past Wednesday 6/18. Pt's BP was 122/85 heart rate 76 beats/minute. Pt does not have BP machine at home. Pt was made aware the NP order the heart  monitor and ultrasound ,because of the symptoms she is having.  Pt is aware that if symptoms get worse during the weekend, she is to go to the ER.

## 2012-12-03 NOTE — ED Provider Notes (Signed)
I saw and evaluated the patient, reviewed the resident's note and I agree with the findings and plan.  Jaelyne Deeg T Artemis Loyal, MD 12/03/12 0632 

## 2012-12-04 ENCOUNTER — Telehealth: Payer: Self-pay | Admitting: *Deleted

## 2012-12-04 ENCOUNTER — Ambulatory Visit: Payer: BC Managed Care – PPO | Admitting: Nurse Practitioner

## 2012-12-04 DIAGNOSIS — I472 Ventricular tachycardia: Secondary | ICD-10-CM

## 2012-12-04 MED ORDER — METOPROLOL SUCCINATE ER 25 MG PO TB24: 25.0000 mg | ORAL_TABLET | Freq: Every day | ORAL | Status: DC

## 2012-12-04 NOTE — Telephone Encounter (Signed)
Pt was seen in ED, pvc's seen. Troponin normal. Pt c/o flu like feelings. ECARDIO results given, NSVT Pt to start toprol xl 25 mg and needs to take it with V-8 juice. Told to hold 1 day for stress echo. Dr Elease Hashimoto wants to add on an echo / pt informed.

## 2012-12-05 ENCOUNTER — Ambulatory Visit (HOSPITAL_COMMUNITY): Payer: BC Managed Care – PPO | Attending: Cardiology | Admitting: Radiology

## 2012-12-05 DIAGNOSIS — R079 Chest pain, unspecified: Secondary | ICD-10-CM | POA: Insufficient documentation

## 2012-12-05 DIAGNOSIS — I059 Rheumatic mitral valve disease, unspecified: Secondary | ICD-10-CM | POA: Insufficient documentation

## 2012-12-05 DIAGNOSIS — E785 Hyperlipidemia, unspecified: Secondary | ICD-10-CM | POA: Insufficient documentation

## 2012-12-05 DIAGNOSIS — I471 Supraventricular tachycardia, unspecified: Secondary | ICD-10-CM | POA: Insufficient documentation

## 2012-12-05 DIAGNOSIS — Z87891 Personal history of nicotine dependence: Secondary | ICD-10-CM | POA: Insufficient documentation

## 2012-12-05 DIAGNOSIS — I472 Ventricular tachycardia: Secondary | ICD-10-CM

## 2012-12-05 DIAGNOSIS — R072 Precordial pain: Secondary | ICD-10-CM

## 2012-12-05 NOTE — Progress Notes (Signed)
Echocardiogram performed.  

## 2012-12-06 ENCOUNTER — Ambulatory Visit (HOSPITAL_COMMUNITY): Payer: BC Managed Care – PPO | Attending: Nurse Practitioner | Admitting: Radiology

## 2012-12-06 ENCOUNTER — Ambulatory Visit (INDEPENDENT_AMBULATORY_CARE_PROVIDER_SITE_OTHER): Payer: BC Managed Care – PPO

## 2012-12-06 DIAGNOSIS — R5381 Other malaise: Secondary | ICD-10-CM | POA: Insufficient documentation

## 2012-12-06 DIAGNOSIS — R110 Nausea: Secondary | ICD-10-CM | POA: Insufficient documentation

## 2012-12-06 DIAGNOSIS — R5383 Other fatigue: Secondary | ICD-10-CM | POA: Insufficient documentation

## 2012-12-06 DIAGNOSIS — R002 Palpitations: Secondary | ICD-10-CM | POA: Insufficient documentation

## 2012-12-06 DIAGNOSIS — Z8673 Personal history of transient ischemic attack (TIA), and cerebral infarction without residual deficits: Secondary | ICD-10-CM | POA: Insufficient documentation

## 2012-12-06 DIAGNOSIS — E785 Hyperlipidemia, unspecified: Secondary | ICD-10-CM | POA: Insufficient documentation

## 2012-12-06 DIAGNOSIS — I493 Ventricular premature depolarization: Secondary | ICD-10-CM

## 2012-12-06 DIAGNOSIS — I341 Nonrheumatic mitral (valve) prolapse: Secondary | ICD-10-CM

## 2012-12-06 DIAGNOSIS — R072 Precordial pain: Secondary | ICD-10-CM

## 2012-12-06 DIAGNOSIS — R079 Chest pain, unspecified: Secondary | ICD-10-CM

## 2012-12-06 DIAGNOSIS — R42 Dizziness and giddiness: Secondary | ICD-10-CM | POA: Insufficient documentation

## 2012-12-06 DIAGNOSIS — R0989 Other specified symptoms and signs involving the circulatory and respiratory systems: Secondary | ICD-10-CM

## 2012-12-06 NOTE — Progress Notes (Signed)
Stress Echocardiogram performed.  

## 2012-12-08 ENCOUNTER — Encounter: Payer: Self-pay | Admitting: Cardiovascular Disease

## 2012-12-11 ENCOUNTER — Ambulatory Visit (INDEPENDENT_AMBULATORY_CARE_PROVIDER_SITE_OTHER): Payer: BC Managed Care – PPO | Admitting: Cardiovascular Disease

## 2012-12-11 ENCOUNTER — Encounter: Payer: Self-pay | Admitting: Cardiovascular Disease

## 2012-12-11 VITALS — BP 128/80 | HR 90 | Ht 64.0 in | Wt 151.4 lb

## 2012-12-11 DIAGNOSIS — R002 Palpitations: Secondary | ICD-10-CM

## 2012-12-11 DIAGNOSIS — R9439 Abnormal result of other cardiovascular function study: Secondary | ICD-10-CM | POA: Insufficient documentation

## 2012-12-11 MED ORDER — PROPRANOLOL HCL 10 MG PO TABS: 10.0000 mg | ORAL_TABLET | Freq: Four times a day (QID) | ORAL | Status: DC | PRN

## 2012-12-11 NOTE — Patient Instructions (Addendum)
Your physician has requested that you have en exercise stress myoview. Please follow instruction sheet, as given.  Your physician recommends that you schedule a follow-up appointment in: 2 months    Your physician has recommended you make the following change in your medication:  Stop toprol xl Take your propranolol 10 mg up to 4 times a day for palpitations

## 2012-12-11 NOTE — Assessment & Plan Note (Signed)
Jean Smith feel ok if /when she takes the Inderal.  She does not have the generalized fatigue with that.  We will have her take the Inderal 10 mg QID .  She needs to sleep better.  i suspect that she has lots of adrenaline surges.    She does not want to / will not take the long acting BB like metoprolol.    Will continue to follow along.

## 2012-12-11 NOTE — Progress Notes (Signed)
Jean Smith Date of Birth: 15-Mar-1951 Medical Record #[id number]  History of Present Illness: Jean Smith is seen back today for a work in visit with Lawson Fiscal several months ago . S . Not been seen since April of 2012. She has MVP and palpitations. Lots of reported stress from a son who is autistic. Normal stress echo from 2009 with normal LV function and MVP noted.   Called last week with dizziness and PVC's. Had been given Diflucan. Thought this was the cause of her symptoms. PVC's on EKG noted. Given metoprolol as well.   Diflucan can cause nausea, HA, abdominal pain, diarrhea, dyspepsia, dizziness and serious reactions include QT prolongation and torsades.   Comes in today. She is here alone. Says she has been "sick" and lightheaded for the last month or so. Had a normal physical in May. Normal labs. Had a skin problem and given prescription for Diflucan that she did not take. Was walking last week and felt "so bad" but not able to really define. Some intermittent chest pressure. Decreased exercise tolerance reported. Felt the PVC's more. Was thinking she had diabetes or a UTI. Went back to her primary provider - saw the NP. She tells me that the NP was upset with her for not taking the diflucan even though her skin problem had improved. She then took one dose. The following day was at the movies and had worsening PVC's. Had to leave. Went to Urgent Care. Negative evaluation. No UTI. Not diabetic. Tries to restrict her caffeine but likes chocolate and does drink tea. Had alcohol last night and this also makes it worse. She admits to lots of stress. Did not take the Metoprolol. She is worried about her copper and Mg levels.   December 11, 2012:   Jean Smith presented with a dizzy episode while walking.  She felt better later that day.  She has continued to have some dizziness since that time - 4 weeks total.    She has been having increasing PVCs and had 1 episode of near syncope while watching a  moview.    She saw Lawson Fiscal and had a 24 hr Holter monitor.  That day was a very stressful day and she had lots of palpitations.  She had several days of feeling poorly.   The 24 HR monitor showed, PVCs and several runs of NSVT and she was started on Metoprolol.  She felt very poorly on the metoprolol.    She had a stress echo after her last visit with Lawson Fiscal - the stress echo was read out as mild - moderate anterior septal wall motion abnormality - concerning for ischemia.  I  think the stress echo looks WNL.    Her BP has been low.    Current Outpatient Prescriptions on File Prior to Visit  Medication Sig Dispense Refill  . albuterol (PROVENTIL HFA;VENTOLIN HFA) 108 (90 BASE) MCG/ACT inhaler Inhale 2 puffs into the lungs every 6 (six) hours as needed for wheezing.      Marland Kitchen ALPRAZolam (XANAX) 1 MG tablet Take 0.5 mg by mouth as needed for anxiety.       Marland Kitchen ibuprofen (ADVIL,MOTRIN) 200 MG tablet Take 200 mg by mouth every 6 (six) hours as needed for pain.      Boris Lown Oil 500 MG CAPS Take 500 mg by mouth daily.      Marland Kitchen loratadine (CLARITIN) 10 MG tablet Take 10 mg by mouth daily as needed for allergies.       Marland Kitchen  Multiple Vitamin (MULTIVITAMIN) capsule Take 1 capsule by mouth daily.        . propranolol (INDERAL) 10 MG tablet Take 10 mg by mouth daily.       . metoprolol succinate (TOPROL XL) 25 MG 24 hr tablet Take 1 tablet (25 mg total) by mouth daily.  30 tablet  5   No current facility-administered medications on file prior to visit.    Allergies  Allergen Reactions  . Crestor (Rosuvastatin Calcium)     Muscle ache  . Latex Swelling  . Lipitor (Atorvastatin Calcium)     Muscle ache    Past Medical History  Diagnosis Date  . Chest pain   . Anxiety   . Mitral valve prolapse   . Hyperlipidemia   . PVC's (premature ventricular contractions)     No past surgical history on file.  History  Smoking status  . Former Smoker  . Quit date: 06/15/1983  Smokeless tobacco  . Not on file     History  Alcohol Use  . Yes    No family history on file.  Review of Systems: The review of systems is per the HPI.  All other systems were reviewed and are negative.  Physical Exam: BP 128/80  Pulse 90  Ht 5\' 4"  (1.626 m)  Wt 151 lb 6.4 oz (68.675 kg)  BMI 25.98 kg/m2  SpO2 97% Patient is very pleasant and in no acute distress. Seems a little anxious to me. Skin is warm and dry. Color is normal.  HEENT is unremarkable. Normocephalic/atraumatic. PERRL. Sclera are nonicteric. Neck is supple. No masses. No JVD. Lungs are clear. Cardiac exam shows a regular rate and rhythm.No real murmur noted.  Abdomen is soft. Extremities are without edema. Gait and ROM are intact. No gross neurologic deficits noted.  LABORATORY DATA: EKG reviewed from Urgent Care and show sinus rhythm with PVCs. QT is normal.   Lab Results  Component Value Date   WBC 7.2 12/01/2012   HGB 13.1 12/01/2012   HCT 37.5 12/01/2012   PLT 222 12/01/2012   GLUCOSE 104* 12/01/2012   NA 140 12/01/2012   K 3.6 12/01/2012   CL 102 12/01/2012   CREATININE 0.79 12/01/2012   BUN 16 12/01/2012   CO2 29 12/01/2012

## 2012-12-11 NOTE — Assessment & Plan Note (Signed)
First echocardiogram was borderline positive. It was read out as having an anterior septal defect. I have reviewed the images and agree that there is perhaps very mild wall motion abnormality but I suspect it could be within normal limits.  She continues to have episodes of chest pressure on occasion. She also has lots of palpitations and has been found to have some episodes of non-sustained ventricular tachycardia.  I think her best option would be to repeat a stress test as a stress Myoview study. She is fairly adamant about not wanting to have a cardiac catheterization at this point.     I think a fair amount of her symptoms are due to stress but I cannot rule out CAD given the above findings.

## 2012-12-14 ENCOUNTER — Telehealth: Payer: Self-pay | Admitting: Cardiovascular Disease

## 2012-12-14 LAB — BASIC METABOLIC PANEL
BUN: 14 mg/dL (ref 4–21)
Creatinine: 0.8 mg/dL (ref 0.5–1.1)
Potassium: 4.4 mmol/L (ref 3.4–5.3)
Sodium: 136 mmol/L — AB (ref 137–147)

## 2012-12-14 LAB — TSH: TSH: 1.97 u[IU]/mL (ref 0.41–5.90)

## 2012-12-14 NOTE — Telephone Encounter (Signed)
Per pt t/c - states she just saw Dr Elease Hashimoto on Monday who ordered a nuc study.  She is scheduled and plans on doing it.  She reports taking her medications as instructed but still feeling bad - that its a "weird feeling" and she is pretty much just "sitting in a chair"  She reports she thinks something is wrong with her "adrenaline glands or endocrine system"  She reports waking this am with her heart pounding at 6 am  For which she took her Inderal and xanax - this has helped.  She denies this being a panic attach "I've had panic attacks before and this doesn't feel like that."  She feels that her feeling bad seems to be related to her meals and that possibly her BS is off.  Advised I will forward information to Dr Elease Hashimoto for his review and any recommendation.  Also advised her to call her PCP for further follow up of her "BS/adrenaline glands or endocrine system" concerns.  Pt is in agreement and thanked me for my time

## 2012-12-14 NOTE — Telephone Encounter (Signed)
Will forward to Dr. Nahser. °

## 2012-12-14 NOTE — Telephone Encounter (Signed)
New Prob    Pt would like to speak to nurse regarding some symptoms she is having. Please call.

## 2012-12-14 NOTE — Telephone Encounter (Signed)
Agree with the note from [place], RN.  She may continue to take Inderal PRN.  Will see what the myoview shows.

## 2012-12-14 NOTE — Telephone Encounter (Signed)
Spoke with pt who states she feels better since she went to see her PCP today.  PCP did some blood work and increased her Vitamin D.  She will call back further problems before her stress testing

## 2012-12-18 ENCOUNTER — Telehealth: Payer: Self-pay | Admitting: Cardiovascular Disease

## 2012-12-18 NOTE — Telephone Encounter (Signed)
New Prob     Pt has some questions regarding PROPRANOLOL. Please call.

## 2012-12-18 NOTE — Telephone Encounter (Signed)
Pt was told to take propranolol every 30 minutes up to 4 doses, pt agreed to plan.

## 2012-12-19 NOTE — Telephone Encounter (Signed)
New Problem  Pt wants to ask a question regarding her test that she is going to have as well as the propranolol.

## 2012-12-19 NOTE — Telephone Encounter (Signed)
Called patient back. She is concerned that if she skips her dose of Inderal she will have a bad episode of tachycardia prior to stress test. Advised her to hold AM dose and if she has a bad spell she can take the inderal but her test might have to be changed to a non walking test if they can't get her heart rate up. Will let Irean Hong RN know in nuc med.

## 2012-12-21 ENCOUNTER — Ambulatory Visit (HOSPITAL_COMMUNITY): Payer: BC Managed Care – PPO | Attending: Cardiovascular Disease | Admitting: Radiology

## 2012-12-21 VITALS — BP 108/73 | HR 73 | Ht 64.0 in | Wt 150.0 lb

## 2012-12-21 DIAGNOSIS — R079 Chest pain, unspecified: Secondary | ICD-10-CM

## 2012-12-21 DIAGNOSIS — R55 Syncope and collapse: Secondary | ICD-10-CM

## 2012-12-21 DIAGNOSIS — R42 Dizziness and giddiness: Secondary | ICD-10-CM | POA: Insufficient documentation

## 2012-12-21 DIAGNOSIS — R0609 Other forms of dyspnea: Secondary | ICD-10-CM | POA: Insufficient documentation

## 2012-12-21 DIAGNOSIS — R9439 Abnormal result of other cardiovascular function study: Secondary | ICD-10-CM

## 2012-12-21 DIAGNOSIS — I4949 Other premature depolarization: Secondary | ICD-10-CM

## 2012-12-21 DIAGNOSIS — R002 Palpitations: Secondary | ICD-10-CM

## 2012-12-21 DIAGNOSIS — R5381 Other malaise: Secondary | ICD-10-CM | POA: Insufficient documentation

## 2012-12-21 DIAGNOSIS — R5383 Other fatigue: Secondary | ICD-10-CM | POA: Insufficient documentation

## 2012-12-21 DIAGNOSIS — R0989 Other specified symptoms and signs involving the circulatory and respiratory systems: Secondary | ICD-10-CM | POA: Insufficient documentation

## 2012-12-21 MED ORDER — TECHNETIUM TC 99M SESTAMIBI GENERIC - CARDIOLITE
33.0000 | Freq: Once | INTRAVENOUS | Status: AC | PRN
  Administered 2012-12-21: 33 via INTRAVENOUS

## 2012-12-21 MED ORDER — TECHNETIUM TC 99M SESTAMIBI GENERIC - CARDIOLITE
11.0000 | Freq: Once | INTRAVENOUS | Status: AC | PRN
  Administered 2012-12-21: 11 via INTRAVENOUS

## 2012-12-21 NOTE — Progress Notes (Signed)
[facility] SITE 3 NUCLEAR MED [address] [place], [place] [address] [phone]    Cardiology Nuclear Med Study  Jean Smith is a 62 y.o. female     MRN : [id number]     DOB: [birth year]  Procedure Date: 12/21/2012  Nuclear Med Background Indication for Stress Test:  Evaluation for Ischemia  And Abnormal Stress Echo History:  '08 QIO:NGEXBM, EF=65%; 12/06/12 Stess Echo:? mild to modrate anterior septal wall motion abnormality, concerning for ischemia; h/o MVP; 6/14 Holter showed PVC's and NSVT Cardiac Risk Factors: History of Smoking and Lipids  Symptoms:  Chest Pressure.  (last episode of chest discomfort was about 2-days ago), Dizziness, DOE, Fatigue, Light-Headedness, Palpitations and Rapid HR   Nuclear Pre-Procedure Caffeine/Decaff Intake:  8:30am NPO After: 8:30am   Lungs:  Clear. O2 Sat: 97% on room air. IV 0.9% NS with Angio Cath:  20g  IV Site: R Antecubital  IV Started by:  Stanton Kidney, EMT-P  Chest Size (in):  34 Cup Size: C  Height: 5\' 4"  (1.626 m)  Weight:  150 lb (68.04 kg)  BMI:  Body mass index is 25.73 kg/(m^2). Tech Comments:  Propranolol held x 24- hours, per patient.    Nuclear Med Study 1 or 2 day study: 1 day  Stress Test Type:  Stress  Reading MD: Kristeen Miss, MD  Order Authorizing Provider:  Kristeen Miss, MD  Resting Radionuclide: Technetium 46m Sestamibi  Resting Radionuclide Dose: 11.0 mCi   Stress Radionuclide:  Technetium 39m Sestamibi  Stress Radionuclide Dose: 33.0 mCi           Stress Protocol Rest HR: 73 Stress HR: 160  Rest BP: 108/73 Stress BP: 181/81  Exercise Time (min): 4:45 METS: 6.7   Predicted Max HR: 160 bpm % Max HR: 100 bpm Rate Pressure Product: 84132   Dose of Adenosine (mg):  n/a Dose of Lexiscan: n/a mg  Dose of Atropine (mg): n/a Dose of Dobutamine: n/a mcg/kg/min (at max HR)  Stress Test Technologist: Smiley Houseman, CMA-N  Nuclear Technologist:  Doyne Keel, CNMT     Rest Procedure:  Myocardial  perfusion imaging was performed at rest 45 minutes following the intravenous administration of Technetium 66m Sestamibi.  Rest ECG: NSR with frequent PVCs  Stress Procedure:  The patient exercised on the treadmill utilizing the Bruce Protocol for 4:45 minutes. The patient stopped due to fatigue and denied any chest pain.  Technetium 46m Sestamibi was injected at peak exercise and myocardial perfusion imaging was performed after a brief delay.  Stress ECG: No significant change from baseline ECG  QPS Raw Data Images:  Normal; no motion artifact; normal heart/lung ratio. Stress Images:  Normal homogeneous uptake in all areas of the myocardium. Rest Images:  Normal homogeneous uptake in all areas of the myocardium. Subtraction (SDS):  No evidence of ischemia. Transient Ischemic Dilatation (Normal <1.22):  n/a Lung/Heart Ratio (Normal <0.45):  0.28  Quantitative Gated Spect Images QGS EDV:  n/a QGS ESV:  n/a  Impression Exercise Capacity:  Fair exercise capacity. BP Response:  Normal blood pressure response. Clinical Symptoms:  mild bilateral arm heaviness ECG Impression:  No significant ST segment change suggestive of ischemia. Comparison with Prior Nuclear Study: No images to compare  Overall Impression:  Normal stress nuclear study.  No evidence of ischemia.   LV Ejection Fraction: Study not gated.  LV Wall Motion:  Study not gated due to frequent PVCs    [name]., MD, [facility] 12/21/2012, 4:16  PM Office - [phone] Pager [phone]

## 2013-01-01 DIAGNOSIS — F329 Major depressive disorder, single episode, unspecified: Secondary | ICD-10-CM | POA: Insufficient documentation

## 2013-01-01 DIAGNOSIS — F32A Depression, unspecified: Secondary | ICD-10-CM | POA: Insufficient documentation

## 2013-01-11 ENCOUNTER — Encounter: Payer: Self-pay | Admitting: Internal Medicine

## 2013-01-12 ENCOUNTER — Telehealth: Payer: Self-pay | Admitting: Cardiovascular Disease

## 2013-01-12 DIAGNOSIS — R002 Palpitations: Secondary | ICD-10-CM

## 2013-01-12 DIAGNOSIS — R42 Dizziness and giddiness: Secondary | ICD-10-CM

## 2013-01-12 DIAGNOSIS — R55 Syncope and collapse: Secondary | ICD-10-CM

## 2013-01-12 NOTE — Telephone Encounter (Signed)
I spoke with the pt and made her aware of recommendations from [facility].  Yesterday the pt took her first Zoloft.  The pt developed a rapid pulse with frequent PVCs and she did take PRN propranolol. The pt feels like her rapid pulse was caused by Zoloft.  The pt has been monitoring her BP and pulse and at 1:20 am 103/65, pulse 80, pt took propranolol.  The pt also complains of dizziness.  This has been ongoing. The pt is also concerned with decreased BP and increased pulse.  The pt thinks she could have POTS.  I spoke with the pt about increasing fluid and salt intake.  The pt said she has been "drinking V8 juice like a fish" and thought this would help her symptoms.  I made the pt aware that she needs to limit her consumption of V8 juice due to it being high in potassium. At this time the pt will continue to use PRN propanolol for palpitations. I will forward this note to Dr Elease Hashimoto to let him know the pt called the office.

## 2013-01-12 NOTE — Telephone Encounter (Signed)
I will forward this message to Kennon Rounds Pharm-D to review the pt's medication question about interaction between zoloft and propranolol.  The pt would also like to know if she can take diflucan with her other medications.  After Kennon Rounds responds to this message the triage nurse will contact the patient.

## 2013-01-12 NOTE — Telephone Encounter (Signed)
Zoloft and propranolol should be fine.  Fluconazole may increase effect of propranolol so would just need to make sure HR does not drop too low.

## 2013-01-12 NOTE — Telephone Encounter (Signed)
New Problem   Patient wants to know if the anti depressant (Zoloft)  is ok to take with the propranolol. Patient states her heart was racing all night.  Wants to know if it is ok to take fluconazole for yeast as well.

## 2013-01-16 ENCOUNTER — Encounter: Payer: Self-pay | Admitting: *Deleted

## 2013-01-16 NOTE — Telephone Encounter (Signed)
PT C/O continued heart pounding and dizziness.  She states she feels she is on a constant adrenalin rush.  When she first wakes she feels fine, once up for 5 minutes she feels bad/ dizziness and heart pounding. She takes the propranolol through the day/ 4 doses, declines metoprolol. bp 106/60, 102/56, 112/60, 114/58 pulses 56-81 Pt expressed concern over magnesium/ calcium level/ POTS/ QT if long or normal/ stress- will be seeing psychologist. Sherron Monday with Lawson Fiscal gerhardt np and advised ref with Dr Graciela Husbands to assess for POTS and speak with Dr Elease Hashimoto on his return next week She is to go to ED or call here with further need.

## 2013-01-16 NOTE — Telephone Encounter (Signed)
New Prob  Pt states she is not feeling well and would like to speak with you. She did not say what was going on with her.

## 2013-01-19 ENCOUNTER — Encounter: Payer: Self-pay | Admitting: *Deleted

## 2013-01-20 NOTE — Telephone Encounter (Signed)
I agree with apt with Dr. Graciela Husbands for possible.  POTS.  No new suggestions.

## 2013-01-22 NOTE — Telephone Encounter (Signed)
New Problem   pt has a very low pulse ( BP is normal)  and wants to make sure that she is fine. Normal routine except for last night she took benadryl. Pt want to know how low is too low

## 2013-01-22 NOTE — Telephone Encounter (Signed)
C/o dizziness off and on, her pulse was 45 x 3 diff times this morning. Current bp 85/60 87/45 and pulse now 79. Echo normal lv function, Per Dr Elease Hashimoto 30 day event monitor/ increase h2o, when bp low eat canned soup, get up slowly, no propranolol unless systolic above 110.Marland Kitchen  Pt verbalized understanding, she wants monitor placed here in office.

## 2013-01-23 LAB — CBC AND DIFFERENTIAL
HCT: 39 % (ref 36–46)
Hemoglobin: 13.6 g/dL (ref 12.0–16.0)
Platelets: 239 10*3/uL (ref 150–399)

## 2013-01-23 LAB — LIPID PANEL
Cholesterol: 257 mg/dL — AB (ref 0–200)
HDL: 69 mg/dL (ref 35–70)
LDL Cholesterol: 157 mg/dL
Triglycerides: 157 mg/dL (ref 40–160)

## 2013-01-23 LAB — BASIC METABOLIC PANEL: BUN: 11 mg/dL (ref 4–21)

## 2013-01-23 LAB — HEPATIC FUNCTION PANEL
Alkaline Phosphatase: 52 U/L (ref 25–125)
Bilirubin, Direct: 0.1 mg/dL (ref 0.01–0.4)

## 2013-01-24 ENCOUNTER — Encounter (INDEPENDENT_AMBULATORY_CARE_PROVIDER_SITE_OTHER): Payer: BC Managed Care – PPO

## 2013-01-24 ENCOUNTER — Encounter: Payer: Self-pay | Admitting: *Deleted

## 2013-01-24 DIAGNOSIS — R002 Palpitations: Secondary | ICD-10-CM

## 2013-01-24 DIAGNOSIS — R42 Dizziness and giddiness: Secondary | ICD-10-CM

## 2013-01-24 DIAGNOSIS — R55 Syncope and collapse: Secondary | ICD-10-CM

## 2013-01-24 NOTE — Progress Notes (Signed)
Patient ID: Jean Smith, female   DOB: 1950/06/19, 62 y.o.   MRN: [id number] [facility] 30 day cardiac event monitor applied to patient.

## 2013-01-25 ENCOUNTER — Telehealth: Payer: Self-pay | Admitting: Cardiology

## 2013-01-25 NOTE — Telephone Encounter (Signed)
Called by e-cardio regarding 30 day event monitor. At 800 pm, had 11 beats wide tachycardia. Faxed results, appears to consistent with ventricular arrhythmia given prolonged slurred upslope (as opposed to aberrancy).  Reviewed chart- noted to have NSVT on holter. had stress echo which was suggestive of potential ischemia, EF normal, LVH present . In preference of noninvasive testing instead of a LHC, an exercise stress MPI was performed which was negative for ischemia, freq PVCs.  Encouraged that she has had a significant work-up thus far which reduces the sense of urgency. Spoke with patient who states she felt the palpitations around 8:00 pm when she was loading the dishwasher. She admits that she is rather an anxious person and my calling seem to provoke some of this anxiety. Discussed with pt emergent indications to come to ER including syncope, pre-syncope, chest pain, or persistent/repetitive palpitations similar to at the time of dishwasher. Pt understands the ER is always an option but given that a significant workup has been done thus far which has been unrevealing, it would be reasonable to follow up in the AM.  Will cc Dr. Elease Hashimoto.

## 2013-01-26 ENCOUNTER — Encounter (HOSPITAL_COMMUNITY): Payer: Self-pay | Admitting: *Deleted

## 2013-01-26 ENCOUNTER — Emergency Department (HOSPITAL_COMMUNITY)
Admission: EM | Admit: 2013-01-26 | Discharge: 2013-01-26 | Disposition: A | Discharge status: Home / Self Care | Payer: BC Managed Care – PPO | Attending: Emergency Medicine | Admitting: Emergency Medicine

## 2013-01-26 DIAGNOSIS — Z8679 Personal history of other diseases of the circulatory system: Secondary | ICD-10-CM | POA: Insufficient documentation

## 2013-01-26 DIAGNOSIS — Z91040 Latex allergy status: Secondary | ICD-10-CM | POA: Insufficient documentation

## 2013-01-26 DIAGNOSIS — R42 Dizziness and giddiness: Secondary | ICD-10-CM

## 2013-01-26 DIAGNOSIS — Z87891 Personal history of nicotine dependence: Secondary | ICD-10-CM | POA: Insufficient documentation

## 2013-01-26 DIAGNOSIS — R5383 Other fatigue: Secondary | ICD-10-CM | POA: Insufficient documentation

## 2013-01-26 DIAGNOSIS — R5381 Other malaise: Secondary | ICD-10-CM | POA: Insufficient documentation

## 2013-01-26 DIAGNOSIS — R002 Palpitations: Secondary | ICD-10-CM

## 2013-01-26 DIAGNOSIS — Z862 Personal history of diseases of the blood and blood-forming organs and certain disorders involving the immune mechanism: Secondary | ICD-10-CM | POA: Insufficient documentation

## 2013-01-26 DIAGNOSIS — Z860100 Personal history of colon polyps, unspecified: Secondary | ICD-10-CM | POA: Insufficient documentation

## 2013-01-26 DIAGNOSIS — R6889 Other general symptoms and signs: Secondary | ICD-10-CM | POA: Insufficient documentation

## 2013-01-26 DIAGNOSIS — Z79899 Other long term (current) drug therapy: Secondary | ICD-10-CM | POA: Insufficient documentation

## 2013-01-26 DIAGNOSIS — R0789 Other chest pain: Secondary | ICD-10-CM | POA: Insufficient documentation

## 2013-01-26 DIAGNOSIS — Z8719 Personal history of other diseases of the digestive system: Secondary | ICD-10-CM | POA: Insufficient documentation

## 2013-01-26 DIAGNOSIS — R41840 Attention and concentration deficit: Secondary | ICD-10-CM | POA: Insufficient documentation

## 2013-01-26 DIAGNOSIS — Z8639 Personal history of other endocrine, nutritional and metabolic disease: Secondary | ICD-10-CM | POA: Insufficient documentation

## 2013-01-26 DIAGNOSIS — F411 Generalized anxiety disorder: Secondary | ICD-10-CM | POA: Insufficient documentation

## 2013-01-26 LAB — CBC
HCT: 38.3 % (ref 36.0–46.0)
MCHC: 33.9 g/dL (ref 30.0–36.0)
RDW: 13.5 % (ref 11.5–15.5)

## 2013-01-26 LAB — BASIC METABOLIC PANEL
BUN: 15 mg/dL (ref 6–23)
GFR calc Af Amer: >90 mL/min (ref >90)
GFR calc non Af Amer: >90 mL/min (ref >90)
Potassium: 4.1 mEq/L (ref 3.5–5.1)
Sodium: 137 mEq/L (ref 135–145)

## 2013-01-26 NOTE — ED Notes (Signed)
The pt is c/o pressure in her chest after she started having a fast heart beat.  She is wearing a holter monitor and she talked to a doctor and he told her to take a lopressor.  She became upset and ecide to come in

## 2013-01-26 NOTE — Telephone Encounter (Signed)
Chart reviewed by Dr Graciela Husbands. He recommended lopressor 25 mg bid/ pt declined. She continued to ask if she should be on Vit D and a multivit, she is apprehensive with her visits from ED, PCP and other recommendations from others. She verbalized she was offered an app with dr Graciela Husbands today but couldn't get here soon enough. She will wait till next ov with Dr Elease Hashimoto and will wait to see if Graciela Husbands can see her another day sooner.

## 2013-01-26 NOTE — ED Provider Notes (Signed)
CSN: [id number]     Arrival date & time 01/26/13  0019 History     First MD Initiated Contact with Patient 01/26/13 [phone]     Chief Complaint  Patient presents with  . Tachycardia   (Consider location/radiation/quality/duration/timing/severity/associated sxs/prior Treatment) HPI 62 yo female presents to the ER with complaint of palpitations, dizziness, chest pressure.  Symptoms have been ongoing for several months.  Pt has had thorough workup through her pcm and cardiologist, currently wearing 30 day holter monitor.  Pt has h/o anxiety.  She reports she was prescribed lexapro but has not filled it yet for concern for interaction with her propranolol.  Pt was contacted by on call cardiologist tonight due to arrhythmia noted on her monitor.  This caused her to "stress out" and her BP to elevate.  She could not sleep, and finally decided to come into the ER after taking xanax and propranolol.  Pt reports she began to feel better while waiting to be seen.  Past Medical History  Diagnosis Date  . Chest pain   . Anxiety   . Mitral valve prolapse   . Hyperlipidemia   . PVC's (premature ventricular contractions)   . Hyperplastic colon polyp   . Diverticulosis    Past Surgical History  Procedure Laterality Date  . Mandible surgery    . Knee surgery Right   . Cesarean section      x 3   Family History  Problem Relation Age of Onset  . Colon cancer Mother   . Colon cancer Maternal Grandmother   . Colon polyps Sister   . Heart disease Father    History  Substance Use Topics  . Smoking status: Former Smoker    Quit date: 06/15/1983  . Smokeless tobacco: Not on file  . Alcohol Use: Yes     Comment: rare   OB History   Grav Para Term Preterm Abortions TAB SAB Ect Mult Living                 Review of Systems  Constitutional: Positive for fatigue.  Cardiovascular: Positive for palpitations.  Endocrine: Positive for cold intolerance.  Neurological: Positive for dizziness and  weakness.  Psychiatric/Behavioral: Positive for confusion and decreased concentration. The patient is nervous/anxious.     Allergies  Crestor; Latex; and Lipitor  Home Medications   Current Outpatient Rx  Name  Route  Sig  Dispense  Refill  . albuterol (PROVENTIL HFA;VENTOLIN HFA) 108 (90 BASE) MCG/ACT inhaler   Inhalation   Inhale 2 puffs into the lungs every 6 (six) hours as needed for wheezing.         Marland Kitchen ALPRAZolam (XANAX) 1 MG tablet   Oral   Take 0.5 mg by mouth as needed for anxiety.          . Calcium Citrate-Vitamin D (CALCIUM CITRATE + PO)   Oral   Take by mouth daily.         Marland Kitchen ibuprofen (ADVIL,MOTRIN) 200 MG tablet   Oral   Take 200 mg by mouth every 6 (six) hours as needed for pain.         Boris Lown Oil 500 MG CAPS   Oral   Take 500 mg by mouth daily.         Marland Kitchen loratadine (CLARITIN) 10 MG tablet   Oral   Take 10 mg by mouth daily as needed for allergies.          . Multiple Vitamin (MULTIVITAMIN) capsule  Oral   Take 1 capsule by mouth daily.           . propranolol (INDERAL) 10 MG tablet   Oral   Take 1 tablet (10 mg total) by mouth 4 (four) times daily as needed.   120 tablet   1    BP 147/82  Pulse 72  Temp(Src) 97.9 F (36.6 C) (Oral)  Resp 14  SpO2 100% Physical Exam  Nursing note and vitals reviewed. Constitutional: She is oriented to person, place, and time. She appears well-developed and well-nourished.  HENT:  Head: Normocephalic and atraumatic.  Nose: Nose normal.  Mouth/Throat: Oropharynx is clear and moist.  Eyes: Conjunctivae and EOM are normal. Pupils are equal, round, and reactive to light.  Neck: Normal range of motion. Neck supple. No JVD present. No tracheal deviation present. No thyromegaly present.  Cardiovascular: Normal rate, regular rhythm, normal heart sounds and intact distal pulses.  Exam reveals no gallop and no friction rub.   No murmur heard. Pulmonary/Chest: Effort normal and breath sounds normal.  No stridor. No respiratory distress. She has no wheezes. She has no rales. She exhibits no tenderness.  Abdominal: Soft. Bowel sounds are normal. She exhibits no distension and no mass. There is no tenderness. There is no rebound and no guarding.  Musculoskeletal: Normal range of motion. She exhibits no edema and no tenderness.  Lymphadenopathy:    She has no cervical adenopathy.  Neurological: She is alert and oriented to person, place, and time. She exhibits normal muscle tone. Coordination normal.  Skin: Skin is warm and dry. No rash noted. No erythema. No pallor.  Psychiatric: She has a normal mood and affect. Her behavior is normal. Judgment and thought content normal.    ED Course   Procedures (including critical care time)  Labs Reviewed  BASIC METABOLIC PANEL - Abnormal; Notable for the following:    Glucose, Bld 120 (*)    All other components within normal limits  CBC  POCT I-STAT TROPONIN I    Date: 01/26/2013  Rate: 85  Rhythm: sinus arrhythmia and premature ventricular contractions (PVC)  QRS Axis: normal  Intervals: normal  ST/T Wave abnormalities: normal  Conduction Disutrbances:none  Narrative Interpretation:   Old EKG Reviewed: changes noted    No results found. 1. Palpitations   2. Dizziness     MDM  62 yo female with palpitations.  She has had eval through cardiology, currently with 30 day monitor.  I have encouraged the patient to continue to f/u with her PCM and cardiology as well as filling her lexapro to help with her coping mechanisms while she is being worked up.  Olivia Mackie, MD 01/26/13 [phone]

## 2013-01-26 NOTE — Telephone Encounter (Signed)
New prob  Pt states she was seen in the ER last night.  Her BP was high and she was having some events happen on her device.

## 2013-01-26 NOTE — ED Notes (Signed)
NURSE FIRST ROUNDS : NURSE EXPLAINED DELAY / WAIT TIME AND PROCESS TO PT. DENIES CHEST PAIN OR PALPITATIONS AT THIS TIME , RESPIRATIONS UNLABORED .

## 2013-01-29 ENCOUNTER — Institutional Professional Consult (permissible substitution): Payer: BC Managed Care – PPO | Admitting: Internal Medicine

## 2013-01-29 NOTE — Telephone Encounter (Signed)
Pt cancelled app with Dr Graciela Husbands but rescheduled. She felt dizzy and didn't want to have to clean up and go for the app. She is having a 24 hr urine through her pcp, checking for a carcinoid tumor which can cause palpitations. She is currently waiting to hear back from that. She will call with further needs.

## 2013-01-29 NOTE — Telephone Encounter (Signed)
Pt cancelled app for today with Dr Graciela Husbands.

## 2013-01-29 NOTE — Telephone Encounter (Signed)
App today with dr Graciela Husbands.

## 2013-01-29 NOTE — Telephone Encounter (Signed)
She has an appointment with Dr. Graciela Husbands at some point.  We have recommended cardiac cath but she did not want to have a cath.

## 2013-01-31 ENCOUNTER — Ambulatory Visit (INDEPENDENT_AMBULATORY_CARE_PROVIDER_SITE_OTHER): Payer: BC Managed Care – PPO | Admitting: Internal Medicine

## 2013-01-31 ENCOUNTER — Encounter: Payer: Self-pay | Admitting: Internal Medicine

## 2013-01-31 VITALS — BP 119/80 | HR 86 | Ht 64.0 in | Wt 153.8 lb

## 2013-01-31 DIAGNOSIS — R002 Palpitations: Secondary | ICD-10-CM

## 2013-01-31 DIAGNOSIS — I472 Ventricular tachycardia: Secondary | ICD-10-CM

## 2013-01-31 DIAGNOSIS — I4949 Other premature depolarization: Secondary | ICD-10-CM

## 2013-01-31 DIAGNOSIS — I493 Ventricular premature depolarization: Secondary | ICD-10-CM

## 2013-01-31 DIAGNOSIS — I499 Cardiac arrhythmia, unspecified: Secondary | ICD-10-CM

## 2013-01-31 NOTE — Progress Notes (Signed)
Little dizzy 

## 2013-01-31 NOTE — Progress Notes (Signed)
Little unsteady.

## 2013-01-31 NOTE — Assessment & Plan Note (Signed)
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 Is not clear to me nor is a clear to the patient that there is a distinct association between her ventricular ectopy and the dizziness and fatigue which has bothered her for the last 2 months. It is however a reasonable hypothesis that this is the case especially given the frequency of ventricular ectopy that is noted on the event recorder. A Holter monitor might be a better way to quantitate her PVC burden.  Telling her that the PVCs and nonsustained ventricular tachycardia occurring in the context of a structurally normal heart was not associated with a poor prognosis was for her very reassuring. At that point she decided that she would just see how she did as opposed to taking more medication.  The Inderal has been complicated by hypotension and would likely not be a good long-term option. In the event that therapy is chosen for ventricular ectopy I would opt for flecainide. She would need post initiation treadmill testing. She is to followup with Dr. Jamse Mead in a couple of weeks and we'll see her again as needed  Notably there is no evidence from her ECG of arrhythmogenic RV cardiomyopathy which is part of the differential diagnosis of ventricular outflow tract tachycardia

## 2013-01-31 NOTE — Patient Instructions (Addendum)
Your physician recommends that you continue on your current medications as directed. Please refer to the Current Medication list given to you today.  Keep appointment with Dr. Elease Hashimoto on 02/15/2013 @ 10:45am.

## 2013-01-31 NOTE — Progress Notes (Signed)
ELECTROPHYSIOLOGY CONSULT NOTE  Patient ID: Jean Smith, MRN: [id number], DOB/AGE: 1951-06-14 62 y.o. Admit date: (Not on file) Date of Consult: 01/31/2013  Primary Physician: No PCP Per Patient Primary Cardiologist:    Chief Complaint: dizziness   HPI Jean Smith is a 62 y.o. female seen for freq PVC  Her history is quite difficult to ascertain. She comes in with 2 months of not feeling well with more dizziness light headiness and weakness. She has a long-standing history of PVCs. It is not clear in her mind that there is a change in the frequency of her PVCs that correlates with the aforementioned symptoms.I filled out and put it in her    She has atypical chest pain with borderline stress echo and underwent myoview which was normal     Past Medical History  Diagnosis Date  . Chest pain   . Anxiety   . Mitral valve prolapse   . Hyperlipidemia   . PVC's (premature ventricular contractions)   . Hyperplastic colon polyp   . Diverticulosis       Surgical History:  Past Surgical History  Procedure Laterality Date  . Mandible surgery    . Knee surgery Right   . Cesarean section      x 3     Home Meds: Prior to Admission medications   Medication Sig Start Date End Date Taking? Authorizing Provider  ALPRAZolam Prudy Feeler) 1 MG tablet Take 1 mg by mouth as needed for anxiety. .25 mg as needed.   Yes Historical Provider, MD  loratadine (CLARITIN) 10 MG tablet Take 10 mg by mouth daily as needed for allergies.    Yes Historical Provider, MD  propranolol (INDERAL) 10 MG tablet Take 1 tablet (10 mg total) by mouth 4 (four) times daily as needed. 12/11/12  Yes Vesta Mixer, MD   Allergies:  Allergies  Allergen Reactions  . Crestor [Rosuvastatin Calcium]     Muscle ache  . Latex Swelling  . Lipitor [Atorvastatin Calcium]     Muscle ache    History   Social History  . Marital Status: Married    Spouse Name: N/A    Number of Children: N/A  . Years of  Education: N/A   Occupational History  . Not on file.   Social History Main Topics  . Smoking status: Former Smoker    Quit date: 06/15/1983  . Smokeless tobacco: Not on file  . Alcohol Use: Yes     Comment: rare  . Drug Use: No  . Sexual Activity: Not on file   Other Topics Concern  . Not on file   Social History Narrative  . No narrative on file     Family History  Problem Relation Age of Onset  . Colon cancer Mother   . Colon cancer Maternal Grandmother   . Colon polyps Sister   . Heart disease Father      ROS:  Please see the history of present illness.     All other systems reviewed and negative.    Physical Exam   Blood pressure 119/80, pulse 86, height 5\' 4"  (1.626 m), weight 153 lb 12.8 oz (69.763 kg). General: Well developed, well nourished female in no acute distress. Head: Normocephalic, atraumatic, sclera non-icteric, no xanthomas, nares are without discharge. EENT: normal Lymph Nodes:  none Back: without scoliosis/kyphosis  no CVA tendersness Neck: Negative for carotid bruits. JVD not elevated. Lungs: Clear bilaterally to auscultation without wheezes, rales, or rhonchi. Breathing is unlabored.  Heart: RRR with S1 S2. No murmur , rubs, or gallops appreciated. Abdomen: Soft, non-tender, non-distended with normoactive bowel sounds. No hepatomegaly. No rebound/guarding. No obvious abdominal masses. Msk:  Strength and tone appear normal for age. Extremities: No clubbing or cyanosis. No  edema.  Distal pedal pulses are 2+ and equal bilaterally. Skin: Warm and Dry Neuro: Alert and oriented X 3. CN III-XII intact Grossly normal sensory and motor function . Psych:  Responds to questions appropriately with a normal affect.      Labs: Cardiac Enzymes No results found for this basename: CKTOTAL, CKMB, TROPONINI,  in the last 72 hours CBC Lab Results  Component Value Date   WBC 6.7 01/26/2013   HGB 13.0 01/26/2013   HCT 38.3 01/26/2013   MCV 86.8 01/26/2013    PLT 215 01/26/2013   PROTIME: No results found for this basename: LABPROT, INR,  in the last 72 hours Chemistry  Recent Labs Lab 01/26/13 0040  NA 137  K 4.1  CL 100  CO2 27  BUN 15  CREATININE 0.66  CALCIUM 9.8  GLUCOSE 120*   Lipids No results found for this basename: CHOL, HDL, LDLCALC, TRIG   BNP No results found for this basename: probnp   Miscellaneous No results found for this basename: DDIMER    Radiology/Studies:  No results found.  EKG:   Sinus rhythm at 72 Intervals 15/08/39 T waves are upright in lead V2 and V3  Event recorder   Freq PVC with LBBB morphology VT NS   Assessment and Plan:   Jean Smith

## 2013-01-31 NOTE — Progress Notes (Signed)
Legs feel heavy

## 2013-02-07 ENCOUNTER — Telehealth: Payer: Self-pay | Admitting: Cardiovascular Disease

## 2013-02-07 NOTE — Telephone Encounter (Signed)
New Problem  Pt states she feels very bad//has been pushing the button on the event monitor to see if that helps// from the chin down feels numb, upset stomach headache.

## 2013-02-07 NOTE — Telephone Encounter (Signed)
**Note De-Identified Jean Smith Obfuscation** Pt is advised that the Event monitor she is wearing is only monitoring her heart and that pushing button for other symptoms will not show on reports. Per pt request Shelly and I looked at her event monitor reports thus far and noted occasional PVC's. Pt is advised to take Propanolol for her PVC's and to contact her PCP for her other symptoms, she verbalized understanding.

## 2013-02-07 NOTE — Telephone Encounter (Signed)
Pt has f/u 02/15/13.

## 2013-02-08 ENCOUNTER — Telehealth: Payer: Self-pay | Admitting: Cardiovascular Disease

## 2013-02-09 ENCOUNTER — Ambulatory Visit (INDEPENDENT_AMBULATORY_CARE_PROVIDER_SITE_OTHER): Payer: BC Managed Care – PPO | Admitting: Internal Medicine

## 2013-02-09 ENCOUNTER — Other Ambulatory Visit (INDEPENDENT_AMBULATORY_CARE_PROVIDER_SITE_OTHER): Payer: BC Managed Care – PPO

## 2013-02-09 ENCOUNTER — Encounter: Payer: Self-pay | Admitting: Internal Medicine

## 2013-02-09 VITALS — BP 130/82 | HR 79 | Temp 97.2°F | Ht 63.5 in | Wt 151.0 lb

## 2013-02-09 DIAGNOSIS — R109 Unspecified abdominal pain: Secondary | ICD-10-CM

## 2013-02-09 DIAGNOSIS — R209 Unspecified disturbances of skin sensation: Secondary | ICD-10-CM

## 2013-02-09 DIAGNOSIS — R5383 Other fatigue: Secondary | ICD-10-CM

## 2013-02-09 DIAGNOSIS — I4949 Other premature depolarization: Secondary | ICD-10-CM

## 2013-02-09 DIAGNOSIS — R200 Anesthesia of skin: Secondary | ICD-10-CM

## 2013-02-09 DIAGNOSIS — I493 Ventricular premature depolarization: Secondary | ICD-10-CM

## 2013-02-09 DIAGNOSIS — F411 Generalized anxiety disorder: Secondary | ICD-10-CM

## 2013-02-09 DIAGNOSIS — R5381 Other malaise: Secondary | ICD-10-CM

## 2013-02-09 LAB — SEDIMENTATION RATE: Sed Rate: 23 mm/hr — ABNORMAL HIGH (ref 0–22)

## 2013-02-09 LAB — HEPATIC FUNCTION PANEL
Bilirubin, Direct: 0 mg/dL (ref 0.0–0.3)
Total Bilirubin: 0.6 mg/dL (ref 0.3–1.2)

## 2013-02-09 LAB — BASIC METABOLIC PANEL
BUN: 14 mg/dL (ref 6–23)
Chloride: 101 mEq/L (ref 96–112)
Glucose, Bld: 128 mg/dL — ABNORMAL HIGH (ref 70–99)
Potassium: 3.9 mEq/L (ref 3.5–5.1)

## 2013-02-09 LAB — CBC WITH DIFFERENTIAL/PLATELET
Eosinophils Relative: 2 % (ref 0.0–5.0)
HCT: 40.5 % (ref 36.0–46.0)
Lymphs Abs: 1.4 10*3/uL (ref 0.7–4.0)
MCV: 86.6 fl (ref 78.0–100.0)
Monocytes Absolute: 0.2 10*3/uL (ref 0.1–1.0)
Platelets: 235 10*3/uL (ref 150.0–400.0)
RDW: 13.7 % (ref 11.5–14.6)
WBC: 5.3 10*3/uL (ref 4.5–10.5)

## 2013-02-09 LAB — VITAMIN B12: Vitamin B-12: 397 pg/mL (ref 211–911)

## 2013-02-09 NOTE — Patient Instructions (Signed)
It was good to see you today. We have reviewed your prior records including labs and tests today we will send to your prior provider(s) for "release of records" as discussed today. Test(s) ordered today. Your results will be released to MyChart (or called to you) after review, usually within 72hours after test completion. If any changes need to be made, you will be notified at that same time. Medications reviewed and updated, no changes recommended at this time. Continue working with your other specialists and counselor Please schedule followup in 6 weeks, call sooner if problems.

## 2013-02-09 NOTE — Progress Notes (Signed)
Subjective:    Patient ID: Jean Smith, female    DOB: 1951-05-13, 62 y.o.   MRN: [id number]  HPI  New patient to me -scheduled to "establish" with me as new PCP in 06/2013 but worked in today as "acute" for overwhelming fatigue, ?viral infection  Patient complains of overwhelming fatigue. Onset symptoms was sudden, approx 3 months ago (early June 2014) - Reports symptoms associated with dizziness, presyncope, numbness of distal extremities and hypersomnia Felts symptoms initially related to Diflucan as rx'd for skin rash 10/2012 by former PCP  Seen in UC for same - dx with PVCs -  follow up with cards and ER - reports Echo and stress tests normal Has been tried on various beta-blocker meds which exacerbate symptoms  Now increasing anxiety because of lack of dx for symptoms    Also reports concern for ?DM - pending appointment with endo 02/2013 for eval of same potential Complicated by stress of caring for adult autistic son and "needy" husband   Past Medical History  Diagnosis Date  . Anxiety   . Mitral valve prolapse   . Hyperlipidemia   . PVC's (premature ventricular contractions)   . Hyperplastic colon polyp   . Diverticulosis    Family History  Problem Relation Age of Onset  . Colon cancer Mother   . Colon cancer Maternal Grandmother   . Colon polyps Sister   . Heart disease Father    History  Substance Use Topics  . Smoking status: Former Smoker    Quit date: 06/15/1983  . Smokeless tobacco: Not on file  . Alcohol Use: Yes     Comment: rare     Review of Systems Constitutional: Negative for fever or weight change.  Respiratory: Negative for cough and shortness of breath.   Cardiovascular: Negative for chest pain or palpitations.  Gastrointestinal: Negative for abdominal pain, no bowel changes.  Musculoskeletal: Negative for gait problem or joint swelling.  Skin: Negative for rash.  Neurological: Negative for dizziness or headache.  No other specific  complaints in a complete review of systems (except as listed in HPI above).     Objective:   Physical Exam BP 130/82  Pulse 79  Temp(Src) 97.2 F (36.2 C) (Oral)  Ht 5' 3.5" (1.613 m)  Wt 151 lb (68.493 kg)  BMI 26.33 kg/m2  SpO2 98% Wt Readings from Last 3 Encounters:  02/09/13 151 lb (68.493 kg)  01/31/13 153 lb 12.8 oz (69.763 kg)  12/21/12 150 lb (68.04 kg)   Constitutional: She appears fatigued, but well-developed and well-nourished. No distress.  HENT: Head: Normocephalic and atraumatic. Ears: B TMs ok, no erythema or effusion; Nose: Nose normal. Mouth/Throat: Oropharynx is clear and moist. No oropharyngeal exudate.  Eyes: Conjunctivae and EOM are normal. Pupils are equal, round, and reactive to light. No scleral icterus.  Neck: Normal range of motion. Neck supple. No JVD or LAD present. No thyromegaly present.  Cardiovascular: Normal rate, irregular rhythm and normal heart sounds.  No murmur heard. No BLE edema. Pulmonary/Chest: Effort normal and breath sounds normal. No respiratory distress. She has no wheezes.  Abdominal: Soft. Bowel sounds are normal. She exhibits no distension. There is no tenderness. no masses Musculoskeletal: Normal range of motion, no joint effusions. No gross deformities Neurological: She is alert and oriented to person, place, and time. No cranial nerve deficit. Coordination, balance, strength, speech and gait are grossly normal.  Skin: Skin is warm and dry. No rash noted. No erythema.  Psychiatric: She has a  flat mood and affect, but also anxious. Her behavior is perseverating, but judgment and thought content appear normal.   Lab Results  Component Value Date   WBC 6.7 01/26/2013   HGB 13.0 01/26/2013   HCT 38.3 01/26/2013   PLT 215 01/26/2013   GLUCOSE 120* 01/26/2013   NA 137 01/26/2013   K 4.1 01/26/2013   CL 100 01/26/2013   CREATININE 0.66 01/26/2013   BUN 15 01/26/2013   CO2 27 01/26/2013       Assessment & Plan:   Fatigue - nonspecific  symptoms/exam - ongoing >32mo associated with dizziness, numbness, occ R flank pain Also PVCs but intolerance of various beta-blocker because of side effects  Clear component of anxiety on exam  Will check screening labs and send for ROI  Also see problem list. Medications and labs reviewed today.  Time spent with pt today 45 minutes, greater than 50% time spent counseling patient on fatigue, dizzy and numbness symptoms, PVCs, anxiety and medication review. Also discussed need for review of prior records with former PCP and specialists

## 2013-02-11 ENCOUNTER — Telehealth: Payer: Self-pay | Admitting: Physician Assistant

## 2013-02-11 NOTE — Telephone Encounter (Signed)
Pt called answer service after hours. She has been wearing heart monitor for PVCs. Had an episode of dizziness today similar to what she's been feeling - see prior phone note. We have not been contacted by [facility] for any acute arrhythmia today. She reports fever, body aches, headache, chest congestion, "like I have pneumonia." No CP, breathing OK. BP is running 80s-90s systolic (most recent 94/53) and pulse in the 60's. She is not sure what she should do. She cannot tell if she is having PVCs right now so asked if she should continue to take propranolol. I advised she hold off on further propranolol given decreased BP. I told her she should consider urgent care if she needs to be evaluated for pneumonia/infection as there is not much I can offer over the phone. It doesn't sound like her main complaints are related to her heart. Recent labwork by PCP looked ok. She plans to call on-call PCP to discuss further eval. Ronie Spies PA-C

## 2013-02-12 ENCOUNTER — Telehealth: Payer: Self-pay | Admitting: Nurse Practitioner

## 2013-02-12 NOTE — Telephone Encounter (Signed)
I received a call from [facility] this afternoon 2/2 nsvt noted on pts monitor.  Pt reported dizziness and flushing surrounding event and afterwards.  F/U ECG per [facility] showed sinus tach with freq pvc's.  I called pt and she is currently feeling well.  She took a propranolol and has since felt better, though she does have a chronic/persistent baseline level of dizziness.  She has been taking propranolol on a prn basis because her BP tends to run under 100 mmHg.  She has f/u with Dr. Elease Hashimoto on 9/4.  We did briefly discuss that Dr. Graciela Husbands wondered if she might be better served with Flecainide if she cont to experience PVC's/NSVT.  She will discuss this on Thursday.

## 2013-02-13 ENCOUNTER — Telehealth: Payer: Self-pay | Admitting: *Deleted

## 2013-02-13 NOTE — Telephone Encounter (Signed)
[facility] Triage Call Report Triage Record Num: [id number] Operator: Lesli Albee Patient Name: Jean Smith Call Date & Time: 02/11/2013 2:07:13PM Patient Phone: [phone] PCP: Patient Gender: Female PCP Fax : Patient DOB: 1951/03/31 Practice Name: [facility] Reason for Call: Caller: Suzzanne/Patient; PCP: Rene Paci (Adults only); CB#: [phone]; Call regarding Body Ache; Pt is calling with body aches with congestion and a h/a since Wed 02/07/2013. She saw Dr. Bayard Hugger on Friday 02/09/2013 for sx and to establish a new pt. Pt has a holter monitor on. She had to call her cardiologist this am for low BP/they advised UC or to call her PCP after discussing her sx with her. Pt states that she has had fever for multple days but her temp is 98.6 (oral) so she technically does not have a fever. Pt is fatigued (this has been ongoing x 3 months) and was evaluated on Friday. She has body aches and pains and feels as if her chest is heavy. RN advised pt is she was worse to go ahead to UC. Pt will comply Protocol(s) Used: Flu-Like Symptoms Recommended Outcome per Protocol: Call Provider within 8 Hours Reason for Outcome: Evaluated by provider AND symptoms worsening when following recommended treatment plan for 48 hours Care Advice: ~ 08/

## 2013-02-15 ENCOUNTER — Ambulatory Visit (INDEPENDENT_AMBULATORY_CARE_PROVIDER_SITE_OTHER): Payer: BC Managed Care – PPO | Admitting: Cardiovascular Disease

## 2013-02-15 ENCOUNTER — Encounter: Payer: Self-pay | Admitting: Cardiovascular Disease

## 2013-02-15 VITALS — BP 114/77 | HR 76 | Ht 63.5 in | Wt 152.0 lb

## 2013-02-15 DIAGNOSIS — R002 Palpitations: Secondary | ICD-10-CM

## 2013-02-15 NOTE — Patient Instructions (Addendum)
Your physician wants you to follow-up in: 6 months  You will receive a reminder letter in the mail two months in advance. If you don't receive a letter, please call our office to schedule the follow-up appointment.  Your physician recommends that you continue on your current medications as directed. Please refer to the Current Medication list given to you today.  

## 2013-02-15 NOTE — Progress Notes (Signed)
Rogelio Seen Date of Birth: 22-Dec-1950 Medical Record #[id number]  History of Present Illness: Ms. Laird is seen back today for a work in visit with Cecille Rubin several months ago . S . Not been seen since April of 2012. She has MVP and palpitations. Lots of reported stress from a son who is autistic. Normal stress echo from 2009 with normal LV function and MVP noted.   Called last week with dizziness and PVC's. Had been given Diflucan. Thought this was the cause of her symptoms. PVC's on EKG noted. Given metoprolol as well.   Diflucan can cause nausea, HA, abdominal pain, diarrhea, dyspepsia, dizziness and serious reactions include QT prolongation and torsades.   Comes in today. She is here alone. Says she has been "sick" and lightheaded for the last month or so. Had a normal physical in May. Normal labs. Had a skin problem and given prescription for Diflucan that she did not take. Was walking last week and felt "so bad" but not able to really define. Some intermittent chest pressure. Decreased exercise tolerance reported. Felt the PVC's more. Was thinking she had diabetes or a UTI. Went back to her primary provider - saw the NP. She tells me that the NP was upset with her for not taking the diflucan even though her skin problem had improved. She then took one dose. The following day was at the movies and had worsening PVC's. Had to leave. Went to Urgent Care. Negative evaluation. No UTI. Not diabetic. Tries to restrict her caffeine but likes chocolate and does drink tea. Had alcohol last night and this also makes it worse. She admits to lots of stress. Did not take the Metoprolol. She is worried about her copper and Mg levels.   December 11, 2012:   Tanaiya presented with a dizzy episode while walking.  She felt better later that day. She has continued to have some dizziness since that time - 4 weeks total.    She has been having increasing PVCs and had 1 episode of near syncope while watching a  moview.    She saw Cecille Rubin and had a 24 hr Holter monitor.  That day was a very stressful day and she had lots of palpitations.  She had several days of feeling poorly.   The 24 HR monitor showed, PVCs and several runs of NSVT and she was started on Metoprolol.  She felt very poorly on the metoprolol.    She had a stress echo after her last visit with Cecille Rubin - the stress echo was read out as mild - moderate anterior septal wall motion abnormality - concerning for ischemia.  I  think the stress echo looks WNL.   Her BP has been low.    Sept. 4, 2014:  Since the last saw Arrie she has continued to have lots of premature ventricular contractions. She's had episodes of nonsustained ventricular tachycardia. A stress echo revealed a question of an anterior wall defect but a subsequent followup stress Myoview study revealed no evidence of ischemia.  She has been seen in consultation by Dr. Caryl Comes.   He reassured her that the presence of PVCs and nonsustained ventricular tachycardia in the setting of a normal left ventricle was a very low risk situation. He can sit or starting flecainide for her premature ventricular contractions.  He ruled out POTS.    For the past week she has had a low grade fever and thought she had a virus.    She has had  a 24 hour urine for catecholamines which was negtive.   She collected over 4 liters of urine during that time and had to use mason jars.  The 24 hour collection was not suitable and will need to be done again.   She has had some facial flushing and red rash at time.   Her grandmother   She has not been out of the house much.  The propranolol makes her feel poorly (heavy chest , constipation).   She related 1 episode when she took a propranolol and then [place] [employer].  She has had fatigue and had to stop and rest frequently.    She has had lots of muscle aches, has taken some motrin.     Current Outpatient Prescriptions on File Prior to Visit  Medication  Sig Dispense Refill  . ALPRAZolam (XANAX) 1 MG tablet Take 1 mg by mouth as needed for anxiety. .25 mg as needed.      . loratadine (CLARITIN) 10 MG tablet Take 10 mg by mouth daily as needed for allergies.       Marland Kitchen propranolol (INDERAL) 10 MG tablet Take 1 tablet (10 mg total) by mouth 4 (four) times daily as needed.  120 tablet  1   No current facility-administered medications on file prior to visit.    Allergies  Allergen Reactions  . Crestor [Rosuvastatin Calcium]     Muscle ache  . Latex Swelling  . Lipitor [Atorvastatin Calcium]     Muscle ache  . Vitamin D Analogs     Skin Rash    Past Medical History  Diagnosis Date  . Anxiety   . Mitral valve prolapse   . Hyperlipidemia   . PVC's (premature ventricular contractions)   . Hyperplastic colon polyp   . Diverticulosis     Past Surgical History  Procedure Laterality Date  . Mandible surgery    . Knee surgery Right   . Cesarean section      x 3    History  Smoking status  . Former Smoker  . Quit date: 06/15/1983  Smokeless tobacco  . Not on file    History  Alcohol Use  . Yes    Comment: rare    Family History  Problem Relation Age of Onset  . Colon cancer Mother   . Colon cancer Maternal Grandmother   . Colon polyps Sister   . Heart disease Father     Review of Systems: The review of systems is per the HPI.  All other systems were reviewed and are negative.  Physical Exam: BP 114/77  Pulse 76  Ht 5' 3.5" (1.613 m)  Wt 152 lb (68.947 kg)  BMI 26.5 kg/m2 Patient is very pleasant and in no acute distress. Seems a little anxious to me. Skin is warm and dry. Color is normal.  HEENT is unremarkable. Normocephalic/atraumatic. PERRL. Sclera are nonicteric. Neck is supple. No masses. No JVD. Lungs are clear. Cardiac exam shows a regular rate and rhythm.No real murmur noted.  Abdomen is soft. Extremities are without edema. Gait and ROM are intact. No gross neurologic deficits noted.  LABORATORY DATA: EKG  reviewed from Urgent Care and show sinus rhythm with PVCs. QT is normal.   Lab Results  Component Value Date   WBC 5.3 02/09/2013   HGB 14.0 02/09/2013   HCT 40.5 02/09/2013   PLT 235.0 02/09/2013   GLUCOSE 128* 02/09/2013   ALT 19 02/09/2013   AST 21 02/09/2013  NA 138 02/09/2013   K 3.9 02/09/2013   CL 101 02/09/2013   CREATININE 0.7 02/09/2013   BUN 14 02/09/2013   CO2 30 02/09/2013   TSH 1.58 02/09/2013

## 2013-02-15 NOTE — Assessment & Plan Note (Addendum)
She continues to have palpitations.  She has been reassured that she has a normal LV and that these PVCs and NSVT are benign.  She has multiple complaints. She has had  increased varicose veins in her legs.  . She has frequent episodes of hypotension. He has frequent fatigue. Her bowel movements are not  regular. She has occasional episodes of fever and flushing.  She's not sure that the propranolol is helping much. I've instructed her to not take the propranolol unless she thinks that it is helping.  Dr. Felicity Coyer has arranged for her to have a 24-hour urine for catecholamines and serotonin. I think that the likelihood of her having pheochromocytoma is very low since her blood pressure is not elevated. I do think that she should be evaluated for the possibility of carcinoid syndrome.   i will see  her again in 6 months for followup office visit.

## 2013-02-19 ENCOUNTER — Telehealth: Payer: Self-pay | Admitting: *Deleted

## 2013-02-19 ENCOUNTER — Institutional Professional Consult (permissible substitution): Payer: BC Managed Care – PPO | Admitting: Internal Medicine

## 2013-02-19 DIAGNOSIS — R232 Flushing: Secondary | ICD-10-CM

## 2013-02-19 DIAGNOSIS — R000 Tachycardia, unspecified: Secondary | ICD-10-CM

## 2013-02-19 NOTE — Telephone Encounter (Signed)
Pt called requesting a 24 hour Carcinoid Test as discussed at last OV. Please advise

## 2013-02-19 NOTE — Telephone Encounter (Signed)
Spoke with pt, advised of MDs message 

## 2013-02-19 NOTE — Telephone Encounter (Signed)
Order entered will need to pick up urinary collection supplies from our lab Please avoid bananas, chocolate and caffeine for 48 hours before test and during the 24 hours of test collection

## 2013-02-23 ENCOUNTER — Other Ambulatory Visit: Payer: BC Managed Care – PPO

## 2013-02-23 DIAGNOSIS — R232 Flushing: Secondary | ICD-10-CM

## 2013-02-23 DIAGNOSIS — R000 Tachycardia, unspecified: Secondary | ICD-10-CM

## 2013-02-27 ENCOUNTER — Encounter: Payer: Self-pay | Admitting: General Practice

## 2013-03-09 ENCOUNTER — Ambulatory Visit (INDEPENDENT_AMBULATORY_CARE_PROVIDER_SITE_OTHER): Payer: BC Managed Care – PPO | Admitting: Internal Medicine

## 2013-03-09 ENCOUNTER — Encounter: Payer: Self-pay | Admitting: Internal Medicine

## 2013-03-09 VITALS — BP 104/78 | HR 81 | Temp 98.1°F | Resp 12 | Ht 64.0 in | Wt 155.0 lb

## 2013-03-09 DIAGNOSIS — R5381 Other malaise: Secondary | ICD-10-CM

## 2013-03-09 NOTE — Patient Instructions (Addendum)
Please return at 8 am on Monday for labs. You do not need to fast. I will send you the results through MyChart. We will schedule a new appointment if labs are abnormal. Please start Magnesium 400 mg daily. Schedule an appt with ObGyn. Keep the appt with Dr Juanda Chance. Check sugars when you feel lightheaded. You might need to have the blood vessels in your neck evaluated.

## 2013-03-09 NOTE — Progress Notes (Signed)
Patient ID: Jean Smith, female   DOB: 1951-02-13, 62 y.o.   MRN: [id number]   HPI  Jean Smith is a 62 y.o.-year-old female, self-referred, for evaluation for dizziness, palpitations, fatigue.  Dr Felicity Coyer = new PCP.   Patient mentions felt well up until Spring of this year except GAD. She c/o getting dizziness that started during her daily walks in 11/2012 >> saw Dr Elease Hashimoto >> NSVT on Holter monitor >> started beta blocker (Propranolol q 6 h) >> nausea, SOB, numbness, dysphagia >> had to go to the ED >> thought to be 2/2 anxiety (is on benzodiazepines) >> event monitor in 01/2013 >> ED again: PVCs (despite Xanax and Propranolol). She decided to see endocrinology to make sure there is no endocrinologic disease that might ulcer symptoms.  Also c/o: - weight gain: Had gained 10 lbs by Spring 2014, then gained more  - flushing >> 5HIAA urinary - normal, cathecolamines reportedly normal - gets red in the face and on neck - ? If they are Hot flushes as she never had them when she actually went through menopause 5 years ago - increased pulse, decreased BP - now only on Propranolol prn - anxiety continues  - Xanax - dizziness continues - especially with raising hands and washing hair, for e.g. Or when exercising with elastic bands at [employer] - continues to have palpitations - gets lightheaded before meals and ~1.5 hours post meals, but mostly after Propranolol - her spider veins on legs have increased - feels bloated and occasionally RLQ pain (no ObGyn now; will have appt with Dr Juanda Chance in a week - last colonoscopy 1 year ago) - she feels unwell, cannot describe Smith, thinks she may be depressed, too.   I reviewed the patient's hemoglobin A1c: Lab Results  Component Value Date   HGBA1C 5.9 01/23/2013   I reviewed pt's thyroid tests:  Lab Results  Component Value Date   TSH 1.58 02/09/2013   TSH 1.97 12/14/2012   TSH 2.07 11/22/2012   TSH 2.30 10/16/2012   FREET4 0.92 02/09/2013   She has a + FH of thyroid disorders in sister.  Also reviewed this workup for carcinoid: - 5-HIAA in 24-hour urine 2.4 (<=6)  CBC and CMP are normal, too, except a slightly higher sugar. An ESR was very mildly elevated at 23 (upper limit of normal 22).  I reviewed the cardiology notes: the patient has been ruled out for POTS, and she had normal 2-D echo and stress test. She did have a 24 HR monitor that showed, PVCs and several runs of NSVT. She also has a history of MVP. Per patient, the cardiology investigation is finished.  She has a family history of hypertension in father and grandmother and also thyroid problems in sister and mother. Diabetes mellitus was found in her grandmother, uncle, aunt.  ROS: + please see history of present illness and also: Constitutional: fatigue, weight gain, fever/chills, feeling cold, hot flashes, poor sleep, + excessive urination Eyes: + blurry vision, no xerophthalmia ENT: no sore throat, + nodules palpated in throat, + dysphagia/no odynophagia, no hoarseness; + tinnitus Cardiovascular: +CP/+SOB/+ palpitations/leg swelling Respiratory: + cough, shortness of breath, wheezing Gastrointestinal: no N/V/D/C, + heartburn Musculoskeletal: + both muscle/joint aches Skin: + varicose veins, itching, hair growth Neurological: no tremors/numbness/tingling/dizziness Psychiatric: + both depression/anxiety  Past Medical History  Diagnosis Date  . Anxiety   . Mitral valve prolapse   . Hyperlipidemia   . PVC's (premature ventricular contractions)   . Hyperplastic colon  polyp   . Diverticulosis    Past Surgical History  Procedure Laterality Date  . Mandible surgery    . Knee surgery Right   . Cesarean section      x 3   History   Social History  . Marital Status: Married    Spouse Name: N/A    Number of Children: 3   Occupational History  . homemaker   Social History Main Topics  . Smoking status: Former Smoker    Quit date: 06/15/1983  .  Smokeless tobacco: Not on file  . Alcohol Use: Yes     Comment: rare  . Drug Use: No   Social History Narrative   Regular exercise: was in the Spring; unable to right now    Caffeine use: none   Current Outpatient Prescriptions on File Prior to Visit  Medication Sig Dispense Refill  . ALPRAZolam (XANAX) 1 MG tablet Take 1 mg by mouth as needed for anxiety. .25 mg as needed.      . loratadine (CLARITIN) 10 MG tablet Take 10 mg by mouth daily as needed for allergies.       Marland Kitchen propranolol (INDERAL) 10 MG tablet Take 1 tablet (10 mg total) by mouth 4 (four) times daily as needed.  120 tablet  1   No current facility-administered medications on file prior to visit.   Allergies  Allergen Reactions  . Crestor [Rosuvastatin Calcium]     Muscle ache  . Latex Swelling  . Lipitor [Atorvastatin Calcium]     Muscle ache  . Vitamin D Analogs     Skin Rash   Family History  Problem Relation Age of Onset  . Colon cancer Mother   . Colon cancer Maternal Grandmother   . Colon polyps Sister   . Heart disease Father    PE: BP 104/78  Pulse 81  Temp(Src) 98.1 F (36.7 C) (Oral)  Resp 12  Ht 5\' 4"  (1.626 m)  Wt 155 lb (70.308 kg)  BMI 26.59 kg/m2  SpO2 98% Wt Readings from Last 3 Encounters:  03/09/13 155 lb (70.308 kg)  02/15/13 152 lb (68.947 kg)  02/09/13 151 lb (68.493 kg)   Constitutional: overweight, in NAD Eyes: PERRLA, EOMI, no exophthalmos ENT: moist mucous membranes, no thyromegaly, no cervical lymphadenopathy Cardiovascular: irreg. Reg. (PACs) R, No MRG Respiratory: CTA B Gastrointestinal: abdomen soft, NT, ND, BS+ Musculoskeletal: no deformities, strength intact in all 4 Skin: moist, warm, no rashes Neurological: no tremor with outstretched hands, DTR normal in all 4  ASSESSMENT: 1. Dizziness/fatigue/palpitations  PLAN:  1. Patient with dizziness, fatigue and palpitations, and other vague sxs. I have low suspicion for endocrine disorders: - no hyperthyroidism per  review of recent labs - no diabetes, but might have reactive hypoglycemia  - given meter and advised to check sugars when feels light-headed. She has prediabetes, per hemoglobin A1c of 5.9x2. - will check a cortisol level at 8 am, although low suspicion for adrenal insufficiency - sxs not indicative of pheochromocytoma, urinary CA were negative reportedly - need records - no carcinoid - per urinary 5 HIAA - since she has vit D def and dizziness/fatigue, will check a celiac panel to screen for celiac disease - depression is high probability - difficult situation at home: husband alcoholic, son with Asperger, lives at home. She is very stressed by her everyday life and has seen counselors in the past. She tells me that she feels that her chronic stress is finally manifesting itself physically. She is  very affected by the fact that she cannot walk with her friend anymore, as this was her only stress coping mechanism in the past. She is not on an antidepressant, takes Xanax for anxiety. - menopausal sxs unlikely to be so dramatic - but advised her to see ObGyn for an exam and suggestions for or against low dose HRT - would investigate for vertebro-basilar insufficiency as she has dizziness and palpitations especially when raises her hands or bends neck backwards  - advised her to start Magnesium since cardiac investigation is over and continues to have palpitations - patient understands and agrees with the plan - will only schedule a new appt if labs are abnormal  Component     Latest Ref Rng 03/12/2013  Gliadin IgG     <20 U/mL 9.2  Gliadin IgA     <20 U/mL 5.8  Tissue Transglutaminase Ab, IgA     <20 U/mL 2.3  Reticulin Ab, IgA     NEGATIVE NEGATIVE   Component     Latest Ref Rng 03/12/2013  Cortisol, Plasma     (4.3-22.4) 22.0   Labs normal. Will let pt know.

## 2013-03-12 ENCOUNTER — Other Ambulatory Visit (INDEPENDENT_AMBULATORY_CARE_PROVIDER_SITE_OTHER): Payer: BC Managed Care – PPO

## 2013-03-12 DIAGNOSIS — R5381 Other malaise: Secondary | ICD-10-CM

## 2013-03-12 DIAGNOSIS — R5383 Other fatigue: Secondary | ICD-10-CM

## 2013-03-13 ENCOUNTER — Ambulatory Visit (INDEPENDENT_AMBULATORY_CARE_PROVIDER_SITE_OTHER): Payer: BC Managed Care – PPO | Admitting: Internal Medicine

## 2013-03-13 ENCOUNTER — Ambulatory Visit: Payer: BC Managed Care – PPO | Admitting: Internal Medicine

## 2013-03-13 ENCOUNTER — Encounter: Payer: Self-pay | Admitting: Internal Medicine

## 2013-03-13 VITALS — BP 100/80 | HR 72 | Ht 63.5 in | Wt 155.4 lb

## 2013-03-13 DIAGNOSIS — R1031 Right lower quadrant pain: Secondary | ICD-10-CM

## 2013-03-13 LAB — GLIADIN ANTIBODIES, SERUM
Gliadin IgA: 5.8 U/mL (ref <20)
Gliadin IgG: 9.2 U/mL (ref <20)

## 2013-03-13 LAB — TISSUE TRANSGLUTAMINASE, IGA: Tissue Transglutaminase Ab, IgA: 2.3 U/mL (ref <20)

## 2013-03-13 LAB — RETICULIN ANTIBODIES, IGA W TITER

## 2013-03-13 NOTE — Patient Instructions (Addendum)
You have been scheduled for a colonoscopy on 05/01/13 @ 2:00 pm.  You are scheduled for a previsit on 04/12/13 @ 2:00 pm (sorry for the inconvenience! Our printers are currently down. Thank you so much for your understanding!)  CC: Dr Melburn Popper, Dr Elvera Lennox, Dr Felicity Coyer

## 2013-03-13 NOTE — Progress Notes (Signed)
Jean Smith 10/11/1950 MRN [id number]  History of Present Illness:  This is a 62 year old white female who is here to discuss a recall colonoscopy. Her last colonoscopy exam was in September 2009. She has a positive family history of colon cancer in her mother and her maternal grandmother  cancer at age 7. Patient has a history of a hyperplastic polyp on her last colonoscopy. She reports having there worst summer this year , she blames it on the beta blockers which were given for palpitations, and which cause her to have a lot of side effects. She denies any rectal bleeding.  She has had at least 2 episodes of right lower quadrant abdominal discomfort. She is currently on magnesium tablets 200 mg 2 tablets daily. She has been checked for carcinoid and for pheochromocytoma. She also has a celiac panel pending from Dr.Gherghe. She has been cleared by Dr Melburn Popper, her cardiologist.    Past Medical History  Diagnosis Date  . Anxiety   . Mitral valve prolapse   . Hyperlipidemia   . PVC's (premature ventricular contractions)   . Hyperplastic colon polyp   . Diverticulosis    Past Surgical History  Procedure Laterality Date  . Mandible surgery    . Knee surgery Right   . Cesarean section      x 3    reports that she quit smoking about 29 years ago. She has never used smokeless tobacco. She reports that  drinks alcohol. She reports that she does not use illicit drugs. family history includes Colon cancer in her maternal grandmother and mother; Colon polyps in her sister; Heart disease in her father. Allergies  Allergen Reactions  . Crestor [Rosuvastatin Calcium]     Muscle ache  . Latex Swelling  . Lipitor [Atorvastatin Calcium]     Muscle ache  . Vitamin D Analogs     Skin Rash        Review of Systems:Denies heartburn dysphagia nausea vomiting. Positive for weight gain of 13 pounds  The remainder of the 10 point ROS is negative except as outlined in H&P   Physical  Exam: General appearance  Well developed, in no distress. Eyes- non icteric. HEENT nontraumatic, normocephalic. Mouth no lesions, tongue papillated, no cheilosis. Neck supple without adenopathy, thyroid not enlarged, no carotid bruits, no JVD. Lungs Clear to auscultation bilaterally. Cor normal S1, normal S2, regular rhythm, no murmur,  quiet precordium. Abdomen: Protuberant soft abdomen with decreased muscle tone. Normal active bowel sounds. Mild tenderness in right and left lower quadrant. No CVA tenderness.  Rectal:Normal perianal area. No external hemorrhoids. Normal rectal sphincter tone. Small amount of Hemoccult negative stool.  Extremities no pedal edema. Skin no lesions. Neurological alert and oriented x 3. Psychological normal mood and affect.  Assessment and Plan:  Problem #3 62 year old white female with a positive family history of colon cancer in her mother and grandmother. She is due for a recall colonoscopy, having a history of a hyperplastic polyp in 2009. She is otherwise asymptomatic. We will proceed with a colonoscopy with propofol sedation using moviprep. We have discussed the prep and the sedation. She will schedule the exam.   03/13/2013 Lina Sar

## 2013-03-14 ENCOUNTER — Ambulatory Visit: Payer: BC Managed Care – PPO | Admitting: Internal Medicine

## 2013-03-15 ENCOUNTER — Encounter: Payer: Self-pay | Admitting: Internal Medicine

## 2013-03-23 ENCOUNTER — Encounter: Payer: Self-pay | Admitting: Internal Medicine

## 2013-03-23 ENCOUNTER — Ambulatory Visit (INDEPENDENT_AMBULATORY_CARE_PROVIDER_SITE_OTHER): Payer: BC Managed Care – PPO | Admitting: Internal Medicine

## 2013-03-23 VITALS — BP 120/78 | HR 96 | Temp 98.8°F | Wt 154.0 lb

## 2013-03-23 DIAGNOSIS — R42 Dizziness and giddiness: Secondary | ICD-10-CM

## 2013-03-23 DIAGNOSIS — Z124 Encounter for screening for malignant neoplasm of cervix: Secondary | ICD-10-CM

## 2013-03-23 DIAGNOSIS — F411 Generalized anxiety disorder: Secondary | ICD-10-CM

## 2013-03-23 MED ORDER — ESCITALOPRAM OXALATE 5 MG PO TABS: 5.0000 mg | ORAL_TABLET | Freq: Every day | ORAL | Status: DC

## 2013-03-23 NOTE — Assessment & Plan Note (Signed)
Chronic symptoms, in counseling for same with Jean Smith Uses small doses of Xanax as needed for panic attack symptoms Has at home prescriptions for sertraline and Lexapro but has never began either Long discussion of manifestations of symptoms of anxiety and panic attacks and encouraged patient to begin Lexapro as previously recommended Patient agrees and will begin low-dose Lexapro at this time Followup in 6-12 weeks on symptoms, patient call sooner if worse or problems with medications

## 2013-03-23 NOTE — Patient Instructions (Signed)
It was good to see you today. We have reviewed your prior records including labs and tests today we'll make referral for Carotid artery evaluation. Our office will contact you regarding appointment(s) once made. Medications reviewed and updated Start lexapro daily a discussed for anxiety symptoms -  Please schedule followup in 3 months, call sooner if problems.

## 2013-03-23 NOTE — Progress Notes (Signed)
  Subjective:    Patient ID: Jean Smith, female    DOB: Nov 02, 1950, 62 y.o.   MRN: [id number]  HPI  Patient is here for followup on her multiple symptoms: fatigue, dizziness, lightheadedness, numbness of hands and feet Onset sudden, early June 2014 - Patient has been evaluated by cardiology, endocrinology and GI in the interval Reports symptoms have improved with discontinuation of beta blocker and initiation of magnesium supplement symptoms complicated by stress of caring for adult autistic son and "needy" husband   Past Medical History  Diagnosis Date  . Anxiety   . Mitral valve prolapse   . Hyperlipidemia   . PVC's (premature ventricular contractions)   . Hyperplastic colon polyp   . Diverticulosis      Review of Systems  Constitutional: Positive for fatigue and unexpected weight change. Negative for fever.  Respiratory: Negative for cough and shortness of breath.   Cardiovascular: Positive for palpitations (improved with Magnesium supplements). Negative for chest pain and leg swelling.  Neurological: Positive for dizziness and light-headedness. Negative for headaches.  Psychiatric/Behavioral: Positive for sleep disturbance and dysphoric mood. The patient is nervous/anxious.         Objective:   Physical Exam BP 120/78  Pulse 96  Temp(Src) 98.8 F (37.1 C) (Oral)  Wt 154 lb (69.854 kg)  BMI 26.85 kg/m2  SpO2 96% Wt Readings from Last 3 Encounters:  03/23/13 154 lb (69.854 kg)  03/13/13 155 lb 6.4 oz (70.489 kg)  03/09/13 155 lb (70.308 kg)   Constitutional: She appears fatigued, but well-developed and well-nourished. No distress.  Neck: Normal range of motion. Neck supple. No JVD or LAD present. No thyromegaly present.  Cardiovascular: Normal rate, irregular rhythm and normal heart sounds.  No murmur heard. No BLE edema. Pulmonary/Chest: Effort normal and breath sounds normal. No respiratory distress. She has no wheezes.  Psychiatric: She has a flat mood and  affect, but also anxious. Her behavior is perseverating, but judgment and thought content appear normal.   Lab Results  Component Value Date   WBC 5.3 02/09/2013   HGB 14.0 02/09/2013   HCT 40.5 02/09/2013   PLT 235.0 02/09/2013   GLUCOSE 128* 02/09/2013   CHOL 257* 01/23/2013   TRIG 157 01/23/2013   HDL 69 01/23/2013   LDLCALC 157 01/23/2013   ALT 19 02/09/2013   AST 21 02/09/2013   NA 138 02/09/2013   K 3.9 02/09/2013   CL 101 02/09/2013   CREATININE 0.7 02/09/2013   BUN 14 02/09/2013   CO2 30 02/09/2013   TSH 1.58 02/09/2013   HGBA1C 5.9 01/23/2013       Assessment & Plan:   see problem list. Medications and labs reviewed today.

## 2013-03-23 NOTE — Assessment & Plan Note (Signed)
Multifactorial, improved with discontinuation of beta blocker No evidence for hyperthyroidism, cortisol disorder, carcinoid or cardiac abnormality Suspect component of anxiety contributing to symptoms, see next Also check carotid Dopplers to evaluate for possible subclavian steal or VBI given exacerbation of symptoms with overhead hands/arm activity

## 2013-03-26 ENCOUNTER — Encounter: Payer: Self-pay | Admitting: Internal Medicine

## 2013-04-02 ENCOUNTER — Encounter (HOSPITAL_COMMUNITY): Payer: BC Managed Care – PPO

## 2013-04-02 ENCOUNTER — Ambulatory Visit (HOSPITAL_COMMUNITY): Payer: BC Managed Care – PPO | Attending: Cardiology

## 2013-04-02 DIAGNOSIS — E785 Hyperlipidemia, unspecified: Secondary | ICD-10-CM | POA: Insufficient documentation

## 2013-04-02 DIAGNOSIS — R42 Dizziness and giddiness: Secondary | ICD-10-CM | POA: Insufficient documentation

## 2013-04-04 ENCOUNTER — Encounter: Payer: Self-pay | Admitting: Internal Medicine

## 2013-04-04 ENCOUNTER — Other Ambulatory Visit: Payer: Self-pay | Admitting: *Deleted

## 2013-04-04 MED ORDER — ESCITALOPRAM OXALATE 5 MG PO TABS: 5.0000 mg | ORAL_TABLET | Freq: Every day | ORAL | Status: DC

## 2013-04-04 NOTE — Telephone Encounter (Signed)
Sent email stating pharmacy states they didn't received lexapro. Resending to [facility]../lmb

## 2013-04-11 ENCOUNTER — Ambulatory Visit (AMBULATORY_SURGERY_CENTER): Payer: Self-pay

## 2013-04-11 ENCOUNTER — Encounter: Payer: Self-pay | Admitting: Internal Medicine

## 2013-04-11 VITALS — Ht 64.0 in | Wt 156.0 lb

## 2013-04-11 DIAGNOSIS — Z860100 Personal history of colon polyps, unspecified: Secondary | ICD-10-CM

## 2013-04-11 MED ORDER — MOVIPREP 100 G PO SOLR: 1.0000 | Freq: Once | ORAL | Status: DC

## 2013-04-13 ENCOUNTER — Telehealth: Payer: Self-pay | Admitting: Internal Medicine

## 2013-04-13 DIAGNOSIS — Z124 Encounter for screening for malignant neoplasm of cervix: Secondary | ICD-10-CM

## 2013-04-13 NOTE — Telephone Encounter (Signed)
Called pt no answer LMOM with md response.../lmb 

## 2013-04-13 NOTE — Telephone Encounter (Signed)
Pt request referral for OBGYN. Pt request female OBGYN doctor please.

## 2013-04-13 NOTE — Telephone Encounter (Signed)
Ok done

## 2013-04-19 ENCOUNTER — Other Ambulatory Visit: Payer: Self-pay

## 2013-04-30 LAB — HM PAP SMEAR

## 2013-05-01 ENCOUNTER — Encounter: Payer: BC Managed Care – PPO | Admitting: Internal Medicine

## 2013-05-28 ENCOUNTER — Other Ambulatory Visit: Payer: Self-pay | Admitting: Emergency Medicine

## 2013-05-31 ENCOUNTER — Other Ambulatory Visit: Payer: Self-pay | Admitting: Internal Medicine

## 2013-06-01 NOTE — Telephone Encounter (Signed)
Faxed script [facility] to [facility].../lmb 

## 2013-06-18 ENCOUNTER — Telehealth: Payer: Self-pay | Admitting: Internal Medicine

## 2013-06-18 NOTE — Telephone Encounter (Signed)
Patient states that her heart has been abnormal.  She states that she feels the skipping of her heart.  She states that it has happened on and off for two years.   She did not take her beta blocker  nor magnesium.   She did take an xanax, and her heart is irregular.  No SOB, but chest pressure is pressure since July.   Stress test was okay per her cardiologist.   Patient wants to know if she can take her beta blocker with the prep.  Patient has had two other colonoscopies before the heart issues.  She's ready to take her moviprep.  Patient states that if she gets really stressed that her heart does this.  I spoke with Dr. Olevia Perches, and she said to tell the patient to take her beta blocker as ordered.   She also stated that the moviprep was fine to take as ordered.   Patient informed.   Also patient was told to call us if anything unusual happened.  She agreed that she would call us with any problems, and would take her beta blocker.

## 2013-06-18 NOTE — Telephone Encounter (Signed)
Returned patient's call and there was no answer.  I left message on her voicemail for her to call me at [phone].

## 2013-06-19 ENCOUNTER — Ambulatory Visit (AMBULATORY_SURGERY_CENTER): Payer: BC Managed Care – PPO | Admitting: Internal Medicine

## 2013-06-19 ENCOUNTER — Encounter: Payer: Self-pay | Admitting: Internal Medicine

## 2013-06-19 VITALS — BP 124/78 | HR 81 | Temp 97.3°F | Resp 24 | Ht 64.0 in | Wt 156.0 lb

## 2013-06-19 DIAGNOSIS — Z86010 Personal history of colonic polyps: Secondary | ICD-10-CM

## 2013-06-19 DIAGNOSIS — D126 Benign neoplasm of colon, unspecified: Secondary | ICD-10-CM

## 2013-06-19 DIAGNOSIS — Z800 Family history of malignant neoplasm of digestive organs: Secondary | ICD-10-CM

## 2013-06-19 DIAGNOSIS — Z1211 Encounter for screening for malignant neoplasm of colon: Secondary | ICD-10-CM

## 2013-06-19 MED ORDER — SODIUM CHLORIDE 0.9 % IV SOLN: 500.0000 mL | INTRAVENOUS | Status: DC

## 2013-06-19 NOTE — Progress Notes (Signed)
A/ox3 pleased with MAC, report to Wendy RN 

## 2013-06-19 NOTE — Progress Notes (Signed)
Called to room to assist during endoscopic procedure.  Patient ID and intended procedure confirmed with present staff. Received instructions for my participation in the procedure from the performing physician.  

## 2013-06-19 NOTE — Patient Instructions (Signed)
YOU HAD AN ENDOSCOPIC PROCEDURE TODAY AT THE [facility]: Refer to the procedure report that was given to you for any specific questions about what was found during the examination.  If the procedure report does not answer your questions, please call your gastroenterologist to clarify.  If you requested that your care partner not be given the details of your procedure findings, then the procedure report has been included in a sealed envelope for you to review at your convenience later.  YOU SHOULD EXPECT: Some feelings of bloating in the abdomen. Passage of more gas than usual.  Walking can help get rid of the air that was put into your GI tract during the procedure and reduce the bloating. If you had a lower endoscopy (such as a colonoscopy or flexible sigmoidoscopy) you may notice spotting of blood in your stool or on the toilet paper. If you underwent a bowel prep for your procedure, then you may not have a normal bowel movement for a few days.  DIET: Your first meal following the procedure should be a light meal and then it is ok to progress to your normal diet.  A half-sandwich or bowl of soup is an example of a good first meal.  Heavy or fried foods are harder to digest and may make you feel nauseous or bloated.  Likewise meals heavy in dairy and vegetables can cause extra gas to form and this can also increase the bloating.  Drink plenty of fluids but you should avoid alcoholic beverages for 24 hours.  ACTIVITY: Your care partner should take you home directly after the procedure.  You should plan to take it easy, moving slowly for the rest of the day.  You can resume normal activity the day after the procedure however you should NOT DRIVE or use heavy machinery for 24 hours (because of the sedation medicines used during the test).    SYMPTOMS TO REPORT IMMEDIATELY: A gastroenterologist can be reached at any hour.  During normal business hours, 8:30 AM to 5:00 PM Monday through Friday,  call [phone].  After hours and on weekends, please call the GI answering service at [phone] who will take a message and have the physician on call contact you.   Following lower endoscopy (colonoscopy or flexible sigmoidoscopy):  Excessive amounts of blood in the stool  Significant tenderness or worsening of abdominal pains  Swelling of the abdomen that is new, acute  Fever of 100F or higher  FOLLOW UP: If any biopsies were taken you will be contacted by phone or by letter within the next 1-3 weeks.  Call your gastroenterologist if you have not heard about the biopsies in 3 weeks.  Our staff will call the home number listed on your records the next business day following your procedure to check on you and address any questions or concerns that you may have at that time regarding the information given to you following your procedure. This is a courtesy call and so if there is no answer at the home number and we have not heard from you through the emergency physician on call, we will assume that you have returned to your regular daily activities without incident.  SIGNATURES/CONFIDENTIALITY: You and/or your care partner have signed paperwork which will be entered into your electronic medical record.  These signatures attest to the fact that that the information above on your After Visit Summary has been reviewed and is understood.  Full responsibility of the confidentiality of this   discharge information lies with you and/or your care-partner.  Recommendations Await pathology results High fiber diet Recall colonoscopy pending pathology report  

## 2013-06-19 NOTE — Op Note (Signed)
[facility]  [address] [place], [id number]   COLONOSCOPY PROCEDURE REPORT  PATIENT: Jean Smith, Jean Smith  MR#: [id number] BIRTHDATE: 08-Mar-1951 , 62  yrs. old GENDER: Female ENDOSCOPIST: Lafayette Dragon, MD REFERRED RS:WNIOEVO Leschber, M.D.Dr Cruzita Lederer. Dr Cathie Olden PROCEDURE DATE:  06/19/2013 PROCEDURE:   Colonoscopy with snare polypectomy First Screening Colonoscopy - Avg.  risk and is 50 yrs.  old or older - No.  Prior Negative Screening - Now for repeat screening. N/A  History of Adenoma - Now for follow-up colonoscopy & has been > or = to 3 yrs.  N/A  Polyps Removed Today? Yes. ASA CLASS:   Class II INDICATIONS:Patient's immediate family history of colon cancer and mother and maternal grandmother with colon cancer at 56.  Last colonoscopy September 2009 showed hyperplastic polyp. MEDICATIONS: MAC sedation, administered by CRNA and Propofol (Diprivan) 280 mg IV  DESCRIPTION OF PROCEDURE:   After the risks benefits and alternatives of the procedure were thoroughly explained, informed consent was obtained.  A digital rectal exam revealed no abnormalities of the rectum.   The LB [id number]  endoscope was introduced through the anus and advanced to the cecum, which was identified by both the appendix and ileocecal valve. No adverse events experienced.   The quality of the prep was excellent, using MoviPrep  The instrument was then slowly withdrawn as the colon was fully examined.      COLON FINDINGS: A polypoid shaped sessile polyp measuring 15 mm in size was found at the cecum.  A polypectomy was performed with a cold snare and with cold forceps.  The resection was complete and the polyp tissue was completely retrieved.   Mild diverticulosis was noted in the sigmoid colon.  Retroflexed views revealed no abnormalities. The time to cecum=4 minutes 39 seconds.  Withdrawal time=8 minutes 56 seconds.  The scope was withdrawn and the procedure  completed. COMPLICATIONS: There were no complications.  ENDOSCOPIC IMPRESSION: 1.   Sessile polyp measuring 15 mm in size was found at the cecum; polypectomy was performed with a cold snare and with cold forceps 2.   Mild diverticulosis was noted in the sigmoid colon  RECOMMENDATIONS: 1.  Await pathology results 2.  high-fiber diet Recall colonoscopy pending path report   eSigned:  Lafayette Dragon, MD 06/19/2013 2:57 PM   cc:   PATIENT NAME:  Jean Smith, Jean Smith MR#: [id number]

## 2013-06-20 ENCOUNTER — Telehealth: Payer: Self-pay | Admitting: *Deleted

## 2013-06-20 NOTE — Telephone Encounter (Signed)
  Follow up Call-  Call back number 06/19/2013  Post procedure Call Back phone  # [phone]  Permission to leave phone message Yes     Patient questions:  Do you have a fever, pain , or abdominal swelling? no Pain Score  0 *  Have you tolerated food without any problems? yes  Have you been able to return to your normal activities? yes  Do you have any questions about your discharge instructions: Diet   no Medications  no Follow up visit  no  Do you have questions or concerns about your Care? no  Actions: * If pain score is 4 or above: No action needed, pain <4.

## 2013-06-25 ENCOUNTER — Encounter: Payer: Self-pay | Admitting: Internal Medicine

## 2013-06-25 ENCOUNTER — Telehealth: Payer: Self-pay | Admitting: Internal Medicine

## 2013-06-25 NOTE — Telephone Encounter (Signed)
Spoke with patient and told her Dr. Olevia Perches has not reviewed results yet. She will get a letter with results when reviewed.

## 2013-06-27 ENCOUNTER — Encounter: Payer: Self-pay | Admitting: *Deleted

## 2013-06-29 ENCOUNTER — Ambulatory Visit (INDEPENDENT_AMBULATORY_CARE_PROVIDER_SITE_OTHER): Payer: BC Managed Care – PPO | Admitting: Internal Medicine

## 2013-06-29 ENCOUNTER — Encounter: Payer: Self-pay | Admitting: Internal Medicine

## 2013-06-29 VITALS — BP 112/78 | HR 85 | Temp 97.8°F | Ht 64.0 in | Wt 157.0 lb

## 2013-06-29 DIAGNOSIS — R002 Palpitations: Secondary | ICD-10-CM

## 2013-06-29 DIAGNOSIS — R21 Rash and other nonspecific skin eruption: Secondary | ICD-10-CM

## 2013-06-29 DIAGNOSIS — Z23 Encounter for immunization: Secondary | ICD-10-CM

## 2013-06-29 DIAGNOSIS — F411 Generalized anxiety disorder: Secondary | ICD-10-CM

## 2013-06-29 NOTE — Progress Notes (Signed)
Pre-visit discussion using our clinic review tool. No additional management support is needed unless otherwise documented below in the visit note.  

## 2013-06-29 NOTE — Progress Notes (Signed)
  Subjective:    Patient ID: Jean Smith, female    DOB: August 18, 1950, 63 y.o.   MRN: [id number]  HPI  Patient is here to "establish" with PCP - seen in 2014 as "acute" for various symptoms which have largely improved:  symptoms included fatigue, dizziness, lightheadedness, numbness of hands and feet Onset sudden, early June 2014 - Patient has been evaluated by cardiology, endocrinology, GI, and other PCP for same freq PVCs on event monitor prompting ER and EP eval for NSVT, but no recommended for antiarrhythmic tx given intol of beta-blocker Reports symptoms have improved with discontinuation of beta blocker and initiation of magnesium supplement symptoms complicated by stress of caring for adult autistic son and "needy" husband   Past Medical History  Diagnosis Date  . Anxiety   . Mitral valve prolapse   . Hyperlipidemia   . PVC's (premature ventricular contractions)     intol of BB, sxc palpitations  . Hyperplastic colon polyp   . Diverticulosis   . 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 Abnormal stress echo 12/11/2012  . ABDOMINAL PAIN RIGHT UPPER QUADRANT 11/14/2009    Qualifier: Diagnosis of  By: Nelson-Smith CMA (AAMA), Dottie       Review of Systems  Constitutional: Positive for fatigue and unexpected weight change. Negative for fever.  Respiratory: Negative for cough and shortness of breath.   Cardiovascular: Positive for palpitations (improved with Magnesium supplements). Negative for chest pain and leg swelling.  Neurological: Positive for dizziness and light-headedness. Negative for headaches.  Psychiatric/Behavioral: Positive for sleep disturbance and dysphoric mood. The patient is nervous/anxious.        Objective:   Physical Exam BP 112/78  Pulse 85  Temp(Src) 97.8 F (36.6 C) (Oral)  Ht 5\' 4"  (1.626 m)  Wt 157 lb (71.215 kg)  BMI 26.94 kg/m2  SpO2 98% Wt Readings from Last 3 Encounters:  06/29/13 157 lb (71.215 kg)  06/19/13 156 lb (70.761 kg)   04/11/13 156 lb (70.761 kg)   Constitutional: She appears fatigued, but well-developed and well-nourished. No distress.  Neck: Normal range of motion. Neck supple. No JVD or LAD present. No thyromegaly present.  Cardiovascular: Normal rate, irregular rhythm and normal heart sounds.  No murmur heard. No BLE edema. Pulmonary/Chest: Effort normal and breath sounds normal. No respiratory distress. She has no wheezes.  Psychiatric: She has a flat mood and affect, but also anxious. Her behavior is perseverating, but judgment and thought content appear normal.   Lab Results  Component Value Date   WBC 5.3 02/09/2013   HGB 14.0 02/09/2013   HCT 40.5 02/09/2013   PLT 235.0 02/09/2013   GLUCOSE 128* 02/09/2013   CHOL 257* 01/23/2013   TRIG 157 01/23/2013   HDL 69 01/23/2013   LDLCALC 157 01/23/2013   ALT 19 02/09/2013   AST 21 02/09/2013   NA 138 02/09/2013   K 3.9 02/09/2013   CL 101 02/09/2013   CREATININE 0.7 02/09/2013   BUN 14 02/09/2013   CO2 30 02/09/2013   TSH 1.58 02/09/2013   HGBA1C 5.9 01/23/2013       Assessment & Plan:   see problem list. Medications and labs reviewed today.  Time spent with pt today 25 minutes, greater than 50% time spent counseling patient on palpitations, anxiety and medication review. Also review of prior records

## 2013-06-29 NOTE — Assessment & Plan Note (Signed)
symptomatic 11/2012, extensive cardiology eval for same summer 2014 reviewed including EP input freq PVCs, but intol of beta-blocker due to side effects ?need for further testing or consideration of flecainide for NSVT as per EP 01/2013 - advised pt to follow up with cards/EP as recommended, though symptoms have largely improved

## 2013-06-29 NOTE — Patient Instructions (Signed)
It was good to see you today.  We have reviewed your prior records including labs and tests today  Medications reviewed and updated -no changes recommended   we'll make referral to dermatology. Our office will contact you regarding appointment(s) once made.  Please schedule followup in 6 months, call sooner if problems.

## 2013-06-29 NOTE — Assessment & Plan Note (Signed)
Chronic symptoms, in counseling for same with Jean Smith Uses small doses of Xanax as needed for panic attack symptoms Has at home prescriptions for sertraline and Lexapro but has never began either Long discussion of manifestations of symptoms of anxiety and panic attacks and encouraged follow up with cards as needed

## 2013-07-04 LAB — HM MAMMOGRAPHY

## 2013-07-09 ENCOUNTER — Encounter: Payer: Self-pay | Admitting: Internal Medicine

## 2013-08-08 ENCOUNTER — Other Ambulatory Visit: Payer: Self-pay | Admitting: Dermatology

## 2013-08-27 ENCOUNTER — Encounter: Payer: Self-pay | Admitting: Physician Assistant

## 2013-08-27 ENCOUNTER — Ambulatory Visit (INDEPENDENT_AMBULATORY_CARE_PROVIDER_SITE_OTHER): Payer: BC Managed Care – PPO | Admitting: Physician Assistant

## 2013-08-27 VITALS — BP 112/72 | HR 91 | Temp 98.3°F

## 2013-08-27 DIAGNOSIS — A499 Bacterial infection, unspecified: Secondary | ICD-10-CM

## 2013-08-27 DIAGNOSIS — J329 Chronic sinusitis, unspecified: Secondary | ICD-10-CM

## 2013-08-27 DIAGNOSIS — B9689 Other specified bacterial agents as the cause of diseases classified elsewhere: Secondary | ICD-10-CM

## 2013-08-27 MED ORDER — AMOXICILLIN 500 MG PO CAPS: 500.0000 mg | ORAL_CAPSULE | Freq: Three times a day (TID) | ORAL | Status: DC

## 2013-08-27 NOTE — Progress Notes (Signed)
    Patient ID: Jean Smith is a 63 y.o. female DOB: April 02, 1951 MRN: [id number]  Subjective:    HPI  complains of head cold symptoms, ?sinusitus Onset >1 week ago, initially improved then relapsing and worse symptoms  First associated with rhinorrhea, sneezing, sore throat, mild headache and low grade fever Now sinus pressure and mild-mod nasal congestion, yellow-green discharge, PND No relief with OTC meds has used Benadryl, sudafed and ibuprofen. Concerned will be traveling for the next five days to remote area for Yoga and meditation and does not want to be disruptive. Precipitated by sick contacts and weather change  Past Medical History  Diagnosis Date  . Anxiety   . Mitral valve prolapse   . Hyperlipidemia   . PVC's (premature ventricular contractions)     intol of BB, sxc palpitations  . Hyperplastic colon polyp   . Diverticulosis   . RVOT ventricular tachycardia/PVCs     EP eval 01/2013 for freq PVCs  . Fuchs' endothelial dystrophy     follows with optho regularly     Review of Systems Constitutional: No change in appetite, no unexpected weight change Pulmonary: No wheezing or hemoptysis Cardiovascular: No chest pain or palpitations Abdomen: No N/V Musculoskeletal: No myalgia    Objective:   Physical Exam BP 112/72  Pulse 91  Temp(Src) 98.3 F (36.8 C) (Oral)  SpO2 96% GEN: mildly ill appearing and audible head/chest congestion HENT: NCAT, mild sinus tenderness bilaterally, nares with thick discharge and turbinate swelling, oropharynx mild erythema and PND, no exudate Eyes: Vision grossly intact, no conjunctivitis Lungs: Clear to auscultation without rhonchi or wheeze, no increased work of breathing Cardiovascular: Regular rate and rhythm, no bilateral edema  Lab Results  Component Value Date   WBC 5.3 02/09/2013   HGB 14.0 02/09/2013   HCT 40.5 02/09/2013   PLT 235.0 02/09/2013   GLUCOSE 128* 02/09/2013   CHOL 257* 01/23/2013   TRIG 157 01/23/2013   HDL 69 01/23/2013   LDLCALC 157 01/23/2013   ALT 19 02/09/2013   AST 21 02/09/2013   NA 138 02/09/2013   K 3.9 02/09/2013   CL 101 02/09/2013   CREATININE 0.7 02/09/2013   BUN 14 02/09/2013   CO2 30 02/09/2013   TSH 1.58 02/09/2013   HGBA1C 5.9 01/23/2013      Assessment & Plan:  Viral URI > progression to acute sinusitis Cough, postnasal drip related to above   Empiric antibiotics prescribed due to symptom duration greater than 7 days and progression despite OTC symptomatic care Intermittent cough - can treat with throat lozenges or hard candy to keep back of throat moist.  Symptomatic care with Tylenol or Advil, hydration and rest -  Saline irrigation and salt gargle advised as needed

## 2013-08-27 NOTE — Patient Instructions (Signed)
It was great to meet you today Jean Smith!   Sinusitis Sinusitis is redness, soreness, and puffiness (inflammation) of the air pockets in the bones of your face (sinuses). The redness, soreness, and puffiness can cause air and mucus to get trapped in your sinuses. This can allow germs to grow and cause an infection.  HOME CARE   Drink enough fluids to keep your pee (urine) clear or pale yellow.  Use a humidifier in your home.  Run a hot shower to create steam in the bathroom. Sit in the bathroom with the door closed. Breathe in the steam 3 4 times a day.  Put a warm, moist washcloth on your face 3 4 times a day, or as told by your doctor.  Use salt water sprays (saline sprays) to wet the thick fluid in your nose. This can help the sinuses drain.  Only take medicine as told by your doctor. GET HELP RIGHT AWAY IF:   Your pain gets worse.  You have very bad headaches.  You are sick to your stomach (nauseous).  You throw up (vomit).  You are very sleepy (drowsy) all the time.  Your face is puffy (swollen).  Your vision changes.  You have a stiff neck.  You have trouble breathing. MAKE SURE YOU:   Understand these instructions.  Will watch your condition.  Will get help right away if you are not doing well or get worse. Document Released: 11/17/2007 Document Revised: 02/23/2012 Document Reviewed: 01/04/2012 [facility] [name].

## 2013-08-27 NOTE — Progress Notes (Signed)
Pre visit review using our clinic review tool, if applicable. No additional management support is needed unless otherwise documented below in the visit note. 

## 2013-09-14 ENCOUNTER — Ambulatory Visit (INDEPENDENT_AMBULATORY_CARE_PROVIDER_SITE_OTHER): Payer: BC Managed Care – PPO | Admitting: Cardiovascular Disease

## 2013-09-14 ENCOUNTER — Encounter: Payer: Self-pay | Admitting: Cardiovascular Disease

## 2013-09-14 VITALS — BP 126/78 | HR 84 | Ht 64.0 in | Wt 156.8 lb

## 2013-09-14 DIAGNOSIS — I472 Ventricular tachycardia: Secondary | ICD-10-CM

## 2013-09-14 DIAGNOSIS — R002 Palpitations: Secondary | ICD-10-CM

## 2013-09-14 DIAGNOSIS — I4729 Other ventricular tachycardia: Secondary | ICD-10-CM

## 2013-09-14 NOTE — Progress Notes (Signed)
Jean Smith Seen Date of Birth: 22-Dec-1950 Medical Record #[id number]  History of Present Illness: Ms. Laird is seen back today for a work in visit with Cecille Rubin several months ago . S . Not been seen since April of 2012. She has MVP and palpitations. Lots of reported stress from a son who is autistic. Normal stress echo from 2009 with normal LV function and MVP noted.   Called last week with dizziness and PVC's. Had been given Diflucan. Thought this was the cause of her symptoms. PVC's on EKG noted. Given metoprolol as well.   Diflucan can cause nausea, HA, abdominal pain, diarrhea, dyspepsia, dizziness and serious reactions include QT prolongation and torsades.   Comes in today. She is here alone. Says she has been "sick" and lightheaded for the last month or so. Had a normal physical in May. Normal labs. Had a skin problem and given prescription for Diflucan that she did not take. Was walking last week and felt "so bad" but not able to really define. Some intermittent chest pressure. Decreased exercise tolerance reported. Felt the PVC's more. Was thinking she had diabetes or a UTI. Went back to her primary provider - saw the NP. She tells me that the NP was upset with her for not taking the diflucan even though her skin problem had improved. She then took one dose. The following day was at the movies and had worsening PVC's. Had to leave. Went to Urgent Care. Negative evaluation. No UTI. Not diabetic. Tries to restrict her caffeine but likes chocolate and does drink tea. Had alcohol last night and this also makes it worse. She admits to lots of stress. Did not take the Metoprolol. She is worried about her copper and Mg levels.   December 11, 2012:   Tanaiya presented with a dizzy episode while walking.  She felt better later that day. She has continued to have some dizziness since that time - 4 weeks total.    She has been having increasing PVCs and had 1 episode of near syncope while watching a  moview.    She saw Cecille Rubin and had a 24 hr Holter monitor.  That day was a very stressful day and she had lots of palpitations.  She had several days of feeling poorly.   The 24 HR monitor showed, PVCs and several runs of NSVT and she was started on Metoprolol.  She felt very poorly on the metoprolol.    She had a stress echo after her last visit with Cecille Rubin - the stress echo was read out as mild - moderate anterior septal wall motion abnormality - concerning for ischemia.  I  think the stress echo looks WNL.   Her BP has been low.    Sept. 4, 2014:  Since the last saw Arrie she has continued to have lots of premature ventricular contractions. She's had episodes of nonsustained ventricular tachycardia. A stress echo revealed a question of an anterior wall defect but a subsequent followup stress Myoview study revealed no evidence of ischemia.  She has been seen in consultation by Dr. Caryl Comes.   He reassured her that the presence of PVCs and nonsustained ventricular tachycardia in the setting of a normal left ventricle was a very low risk situation. He can sit or starting flecainide for her premature ventricular contractions.  He ruled out POTS.    For the past week she has had a low grade fever and thought she had a virus.    She has had  a 24 hour urine for catecholamines which was negtive.   She collected over 4 liters of urine during that time and had to use mason jars.  The 24 hour collection was not suitable and will need to be done again.   She has had some facial flushing and red rash at time.      She has not been out of the house much.  The propranolol makes her feel poorly (heavy chest , constipation).   She related 1 episode when she took a propranolol and then went out the the rock / [employer].  She has had fatigue and had to stop and rest frequently.    She has had lots of muscle aches, has taken some motrin.     September 14, 2013:  Talli is doing well.  She happy that her daughter got into the  graduate program at [employer].   She seems to be stable form a cardiac standpoint.  She notices that she has some palps if she is tired or is hungry   Current Outpatient Prescriptions on File Prior to Visit  Medication Sig Dispense Refill  . loratadine (CLARITIN) 10 MG tablet Take 10 mg by mouth daily as needed for allergies.        No current facility-administered medications on file prior to visit.    Allergies  Allergen Reactions  . Crestor [Rosuvastatin Calcium]     Muscle ache  . Latex Swelling  . Lipitor [Atorvastatin Calcium]     Muscle ache  . Other     Grass, local trees, cats and dogs  . Pollen Extract   . Vitamin D Analogs     Skin Rash    Past Medical History  Diagnosis Date  . Anxiety   . Mitral valve prolapse   . Hyperlipidemia   . PVC's (premature ventricular contractions)     intol of BB, sxc palpitations  . Hyperplastic colon polyp   . Diverticulosis   . RVOT ventricular tachycardia/PVCs     EP eval 01/2013 for freq PVCs  . Fuchs' endothelial dystrophy     follows with optho regularly     Past Surgical History  Procedure Laterality Date  . Mandible surgery    . Knee surgery Right   . Cesarean section      x 3    History  Smoking status  . Former Smoker  . Quit date: 06/15/1983  Smokeless tobacco  . Never Used    History  Alcohol Use  . Yes    Comment: rare    Family History  Problem Relation Age of Onset  . Colon cancer Mother   . Colon cancer Maternal Grandmother   . Colon polyps Sister   . Heart disease Father     Review of Systems: The review of systems is per the HPI.  All other systems were reviewed and are negative.  Physical Exam: BP 126/78  Pulse 84  Ht 5\' 4"  (1.626 m)  Wt 156 lb 12.8 oz (71.124 kg)  BMI 26.90 kg/m2 Patient is very pleasant and in no acute distress.   Skin is warm and dry. Color is normal.  HEENT is unremarkable. Normocephalic/atraumatic. PERRL. Sclera are nonicteric. Neck is supple. No masses. No JVD.  Lungs are clear. Cardiac exam shows a regular rate and rhythm.No real murmur noted.  Abdomen is soft. Extremities are without edema. Gait and ROM are intact. No gross neurologic deficits noted.  LABORATORY DATA: EKG reviewed from Urgent Care and show  sinus rhythm with PVCs. QT is normal.   ECG: 10/11/2013: Normal sinus rhythm at 84. EKG is normal

## 2013-09-14 NOTE — Assessment & Plan Note (Signed)
Jean Smith seems to be doing well. Will continue with her same medications that she has palpitations when she is anxious for it she is very hungry. I'll see her again in one year for followup visit.  She has history of some nonsustained ventricular tachycardia. She's not had any further palpitations. She had a stress test that had some mild abnormalities and we suggested cardiac catheterization. She declined to have a cardiac catheterization. At this point she remained stable and has not had any cardiac issues so at this point I will not pursue cardiac cath in I don't think that it was needed after all.  I'll see her again in one year.

## 2013-09-14 NOTE — Patient Instructions (Signed)
Your physician wants you to follow-up in: 1 YEAR.  You will receive a reminder letter in the mail two months in advance. If you don't receive a letter, please call our office to schedule the follow-up appointment.  Your physician recommends that you continue on your current medications as directed. Please refer to the Current Medication list given to you today.  

## 2013-10-03 NOTE — Telephone Encounter (Signed)
error 

## 2013-10-30 ENCOUNTER — Ambulatory Visit: Payer: BC Managed Care – PPO | Admitting: Physician Assistant

## 2013-10-30 ENCOUNTER — Other Ambulatory Visit (INDEPENDENT_AMBULATORY_CARE_PROVIDER_SITE_OTHER): Payer: BC Managed Care – PPO

## 2013-10-30 ENCOUNTER — Ambulatory Visit (INDEPENDENT_AMBULATORY_CARE_PROVIDER_SITE_OTHER)
Admission: RE | Admit: 2013-10-30 | Discharge: 2013-10-30 | Disposition: A | Discharge status: Home / Self Care | Payer: BC Managed Care – PPO | Source: Ambulatory Visit | Attending: Family Medicine | Admitting: Family Medicine

## 2013-10-30 ENCOUNTER — Ambulatory Visit (INDEPENDENT_AMBULATORY_CARE_PROVIDER_SITE_OTHER): Payer: BC Managed Care – PPO | Admitting: Family Medicine

## 2013-10-30 ENCOUNTER — Encounter: Payer: Self-pay | Admitting: Family Medicine

## 2013-10-30 VITALS — BP 112/82 | HR 96 | Ht 64.0 in | Wt 158.0 lb

## 2013-10-30 DIAGNOSIS — M25561 Pain in right knee: Secondary | ICD-10-CM

## 2013-10-30 DIAGNOSIS — M1710 Unilateral primary osteoarthritis, unspecified knee: Secondary | ICD-10-CM | POA: Insufficient documentation

## 2013-10-30 DIAGNOSIS — S76119A Strain of unspecified quadriceps muscle, fascia and tendon, initial encounter: Secondary | ICD-10-CM

## 2013-10-30 DIAGNOSIS — S838X9A Sprain of other specified parts of unspecified knee, initial encounter: Secondary | ICD-10-CM

## 2013-10-30 DIAGNOSIS — M25569 Pain in unspecified knee: Secondary | ICD-10-CM

## 2013-10-30 DIAGNOSIS — S86819A Strain of other muscle(s) and tendon(s) at lower leg level, unspecified leg, initial encounter: Secondary | ICD-10-CM

## 2013-10-30 NOTE — Assessment & Plan Note (Signed)
X-rays are ordered today and reviewed by me. X-ray show that patient does have mild osteoarthritic changes that could be contributing to patient's pain. We'll see if we can resolve patient's quadricep injury and then see a she responds to conservative therapy. This will followup in 3 weeks for further evaluation.

## 2013-10-30 NOTE — Progress Notes (Signed)
[name] [place] [place], [place] [address] Phone: [phone] Subjective:    I'm seeing this patient by the request  of:  Gwendolyn Grant, MD   CC: Right knee pain  [id number] Jean Smith is a 63 y.o. female coming in with complaint of right knee pain. Patient states that it is better in 2 weeks and she visited her grandchildren and was carrying her grandchild on a regular basis. Patient states that is circular more discomfort which is going up or down stairs. Patient states that any time she tries to bend her knee she starts having some difficulty. Patient states is from time to time it feels unstable as well. Patient has had knee pain previously intermittently but now it seems to be significantly worsen describes it as more of a dull aching sensation. Patient states initially it did have some swelling and states that he does feel that there is still some left. Patient denies any radiation or any numbness and is still able to do activities of daily living. Denies any nighttime awakening.     Past medical history, social, surgical and family history all reviewed in electronic medical record.   Review of Systems: No headache, visual changes, nausea, vomiting, diarrhea, constipation, dizziness, abdominal pain, skin rash, fevers, chills, night sweats, weight loss, swollen lymph nodes, body aches, joint swelling, muscle aches, chest pain, shortness of breath, mood changes.   Objective Blood pressure 112/82, pulse 96, height 5\' 4"  (1.626 m), weight 158 lb (71.668 kg), SpO2 97.00%.  General: No apparent distress alert and oriented x3 mood and affect normal, dressed appropriately.  HEENT: Pupils equal, extraocular movements intact  Respiratory: Patient's speak in full sentences and does not appear short of breath  Cardiovascular: No lower extremity edema, non tender, no erythema  Skin: Warm dry intact with no signs of infection or rash on extremities  or on axial skeleton.  Abdomen: Soft nontender  Neuro: Cranial nerves II through XII are intact, neurovascularly intact in all extremities with 2+ DTRs and 2+ pulses.  Lymph: No lymphadenopathy of posterior or anterior cervical chain or axillae bilaterally.  Gait normal with good balance and coordination.  MSK:  Non tender with full range of motion and good stability and symmetric strength and tone of shoulders, elbows, wrist, hip, and ankles bilaterally.  Knee: Right Normal to inspection with no erythema or effusion or obvious bony abnormalities. Mild fullness noted of the vastus lateral as compared to the contralateral side Palpation reveals medial and lateral joint line tenderness as well as tenderness over the lateral condyle region. ROM full in flexion and extension and lower leg rotation. Ligaments with solid consistent endpoints including ACL, PCL, LCL, MCL.  Mcmurray's, Apley's, and Thessalonian tests. Non painful patellar compression. Patellar glide moderate crepitus. Patellar and quadriceps teequivicalndons unremarkable. Hamstring and quadriceps strength shows mild weakness compared to contralateral side Contralateral knee does have some mild tenderness to the medial and lateral joint line.  MSK US performed of: Right knee This study was ordered, performed, and interpreted by Charlann Boxer D.O.  Knee: All structures visualized. Anteromedial, anterolateral, posteromedial, and posterolateral menisci unremarkable without tearing, fraying, effusion, or displacement. Patient does have some degenerative changes of the meniscus as well as narrowing of the medial joint lines bilaterally. Patellar Tendon unremarkable on long and transverse views without effusion. No abnormality of prepatellar bursa. LCL and MCL unremarkable on long and transverse views. No abnormality of origin of medial or lateral head of the  gastrocnemius. Patient's vastus lateralis to does have what appears to be a  resolving hematoma and likely tear right at the muscular tendon junction.  IMPRESSION: Mild osteoarthritis with quadricep injury .      Impression and Recommendations:     This case required medical decision making of moderate complexity.

## 2013-10-30 NOTE — Patient Instructions (Signed)
Very nice to meet you xrays downstairs Wear brace with activity  Ice 20 minutes 2 times a day Take tylenol 650 mg three times a day is the best evidence based medicine we have for arthritis.  Vitamin D 2000 IU daily if you can tolerate Fish oil 2 grams daily.  Tumeric 500mg  twice daily.  Capsaicin topically up to four times a day may also help with pain. Cortisone injections are an option if these interventions do not seem to make a difference or need more relief.  If cortisone injections do not help, there are different types of shots that may help but they take longer to take effect.  We can discuss this at follow up.  It's important that you continue to stay active. Controlling your weight is important. Every pound you lose is 4 Pounds to your knee.   Shoe inserts with good arch support may be helpful.  Spenco orthotics at [employer] could help.  Good shoes.  Bronwen Betters, [employer] and new balance.  Water aerobics and cycling with low resistance are the best two types of exercise for arthritis. Come back and see me in 2 weeks.  If in pain will do injection.

## 2013-10-30 NOTE — Assessment & Plan Note (Signed)
Patient does have some mild quadriceps difficulty. I do think that patient has an injury in is likely what giving her most of her pain. Do think underlying osteoarthritis can be giving her trouble as well. Shoes due to brace today to try to give her some support. I also discussed icing and over-the-counter medications he to be beneficial. Patient like to avoid any type of prescription medication. Patient is to try these interventions and come back again in 3 weeks for further evaluation.

## 2013-11-12 ENCOUNTER — Encounter: Payer: Self-pay | Admitting: Family Medicine

## 2013-11-12 ENCOUNTER — Ambulatory Visit (INDEPENDENT_AMBULATORY_CARE_PROVIDER_SITE_OTHER): Payer: BC Managed Care – PPO | Admitting: Family Medicine

## 2013-11-12 ENCOUNTER — Other Ambulatory Visit (INDEPENDENT_AMBULATORY_CARE_PROVIDER_SITE_OTHER): Payer: BC Managed Care – PPO

## 2013-11-12 VITALS — BP 120/72 | HR 90 | Ht 64.0 in | Wt 160.0 lb

## 2013-11-12 DIAGNOSIS — S838X9A Sprain of other specified parts of unspecified knee, initial encounter: Secondary | ICD-10-CM

## 2013-11-12 DIAGNOSIS — M1710 Unilateral primary osteoarthritis, unspecified knee: Secondary | ICD-10-CM

## 2013-11-12 DIAGNOSIS — S76119A Strain of unspecified quadriceps muscle, fascia and tendon, initial encounter: Secondary | ICD-10-CM

## 2013-11-12 DIAGNOSIS — S86819A Strain of other muscle(s) and tendon(s) at lower leg level, unspecified leg, initial encounter: Secondary | ICD-10-CM

## 2013-11-12 NOTE — Progress Notes (Signed)
[facility] [address] [place], [place] [address] Phone: [phone] Subjective:     CC: Right knee pain follow up  [id number] Jean Smith is a 63 y.o. female coming in with complaint of right knee pain. Patient was seen previously and did have a quadricep muscle strain. Patient was given a brace, icing protocol, we discussed anti-inflammatories. Patient states that she is doing much better. Patient still states that she has some mild discomfort on the lateral aspect if she stands for multiple hours on end. Otherwise patient states that she is able to do all her activities of daily living unable to sleep comfortably. Patient is very happy with the results so far. Patient is a states that the swelling that she had previously has gone down tremendously.    Past medical history, social, surgical and family history all reviewed in electronic medical record.   Review of Systems: No headache, visual changes, nausea, vomiting, diarrhea, constipation, dizziness, abdominal pain, skin rash, fevers, chills, night sweats, weight loss, swollen lymph nodes, body aches, joint swelling, muscle aches, chest pain, shortness of breath, mood changes.   Objective Blood pressure 120/72, pulse 90, height 5\' 4"  (1.626 m), weight 160 lb (72.576 kg), SpO2 95.00%.  General: No apparent distress alert and oriented x3 mood and affect normal, dressed appropriately.  HEENT: Pupils equal, extraocular movements intact  Respiratory: Patient's speak in full sentences and does not appear short of breath  Cardiovascular: No lower extremity edema, non tender, no erythema  Skin: Warm dry intact with no signs of infection or rash on extremities or on axial skeleton.  Abdomen: Soft nontender  Neuro: Cranial nerves II through XII are intact, neurovascularly intact in all extremities with 2+ DTRs and 2+ pulses.  Lymph: No lymphadenopathy of posterior or anterior cervical chain or axillae  bilaterally.  Gait normal with good balance and coordination.  MSK:  Non tender with full range of motion and good stability and symmetric strength and tone of shoulders, elbows, wrist, hip, and ankles bilaterally.  Knee: Right Normal to inspection with no erythema or effusion or obvious bony abnormalities. Mild fullness noted of the vastus lateral as compared to the contralateral side Palpation reveals medial and lateral joint line tenderness as well as tenderness over the lateral condyle region. ROM full in flexion and extension and lower leg rotation. Ligaments with solid consistent endpoints including ACL, PCL, LCL, MCL.  Mcmurray's, Apley's, and Thessalonian tests. Non painful patellar compression. Patellar glide mild to moderate crepitus. Patellar and quadriceps teequivicalndons unremarkable. Hamstring and quadriceps strength shows mild weakness compared to contralateral side Contralateral knee does have some mild tenderness to the medial and lateral joint line.  MSK US performed of: Right knee This study was ordered, performed, and interpreted by Charlann Boxer D.O.  Knee: All structures visualized. Anteromedial, anterolateral, posteromedial, and posterolateral menisci unremarkable without tearing, fraying, effusion, or displacement. Patient does have some degenerative changes of the meniscus as well as narrowing of the medial joint lines bilaterally. Patellar Tendon unremarkable on long and transverse views without effusion. No abnormality of prepatellar bursa. LCL and MCL unremarkable on long and transverse views. No abnormality of origin of medial or lateral head of the gastrocnemius. Patient's vastus lateralis to does have what appears to be a resolving hematoma and likely tear right at the muscular tendon junction.  IMPRESSION: Mild osteoarthritis with quadricep injury improving.  .    Impression and Recommendations:     This case required medical decision  making of moderate  complexity.

## 2013-11-12 NOTE — Patient Instructions (Signed)
Good to see you Ice still after activity and before bed Wear brace for your long walk.  Try the tape daily.  Continue the exercises 3 times a week See me if you get worse.

## 2013-11-12 NOTE — Assessment & Plan Note (Signed)
As above.

## 2013-11-12 NOTE — Assessment & Plan Note (Signed)
Patient is improving overall. It does seem to be getting better. 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 We discussed about the possibility of an intra-articular injection but overall she is doing somewhat better. Patient will continue the exercises as well as the bracing. Patient has any worsening pain she will come back and we'll consider an intra-articular injection.  Spent greater than 25 minutes with patient face-to-face and had greater than 50% of counseling including as described above in assessment and plan.

## 2013-11-21 ENCOUNTER — Other Ambulatory Visit: Payer: Self-pay | Admitting: Internal Medicine

## 2013-11-21 NOTE — Telephone Encounter (Signed)
Ok - note need for screening with [facility] prior to picking up/sending refill

## 2013-11-22 NOTE — Telephone Encounter (Signed)
Called pt no answer LMOM before rx can be given pt must sign control substance contract/ UDS. Left contract & rx up front for pick-up...Jean Smith

## 2013-12-10 ENCOUNTER — Ambulatory Visit (INDEPENDENT_AMBULATORY_CARE_PROVIDER_SITE_OTHER): Payer: BC Managed Care – PPO | Admitting: Licensed Clinical Social Worker

## 2013-12-10 DIAGNOSIS — F331 Major depressive disorder, recurrent, moderate: Secondary | ICD-10-CM

## 2013-12-12 ENCOUNTER — Encounter: Payer: Self-pay | Admitting: Internal Medicine

## 2013-12-19 ENCOUNTER — Ambulatory Visit: Payer: BC Managed Care – PPO | Admitting: Licensed Clinical Social Worker

## 2014-05-22 IMAGING — CR DG KNEE STANDING AP BILAT
1 series · 1 of 1 positions shown · non-contrast
Comparison: None.

CLINICAL DATA: Right knee pain and swelling.

EXAM:
BILATERAL KNEES STANDING - 1 VIEW

[view not recorded]
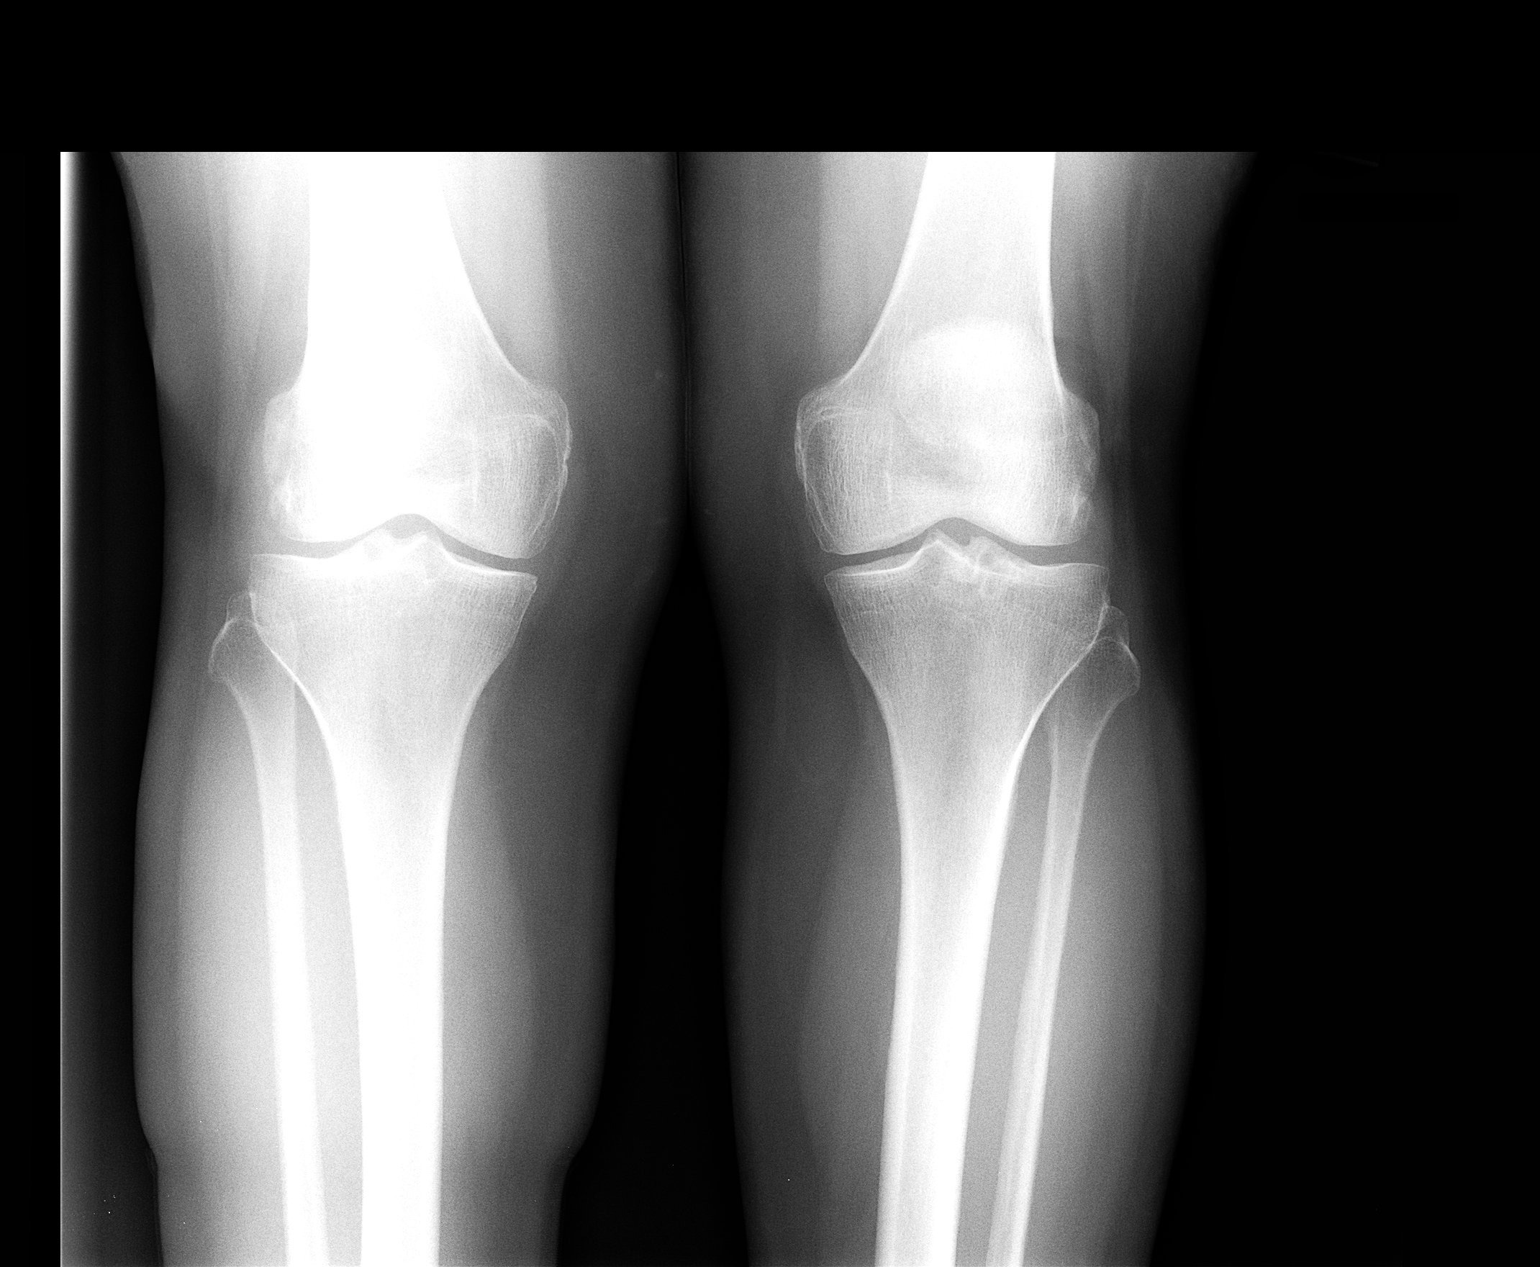

[1 of 1 positions shown; findings below may reference images not displayed]

FINDINGS: There is no fracture or dislocation. There is slight medial joint
space narrowing on the right as compared to the left. No marginal
osteophytes on either side.
IMPRESSION: Slight narrowing of the medial compartment of the right knee
consistent with early degenerative arthritic changes.

## 2014-07-16 ENCOUNTER — Encounter: Payer: Self-pay | Admitting: Internal Medicine

## 2014-07-16 ENCOUNTER — Ambulatory Visit (INDEPENDENT_AMBULATORY_CARE_PROVIDER_SITE_OTHER): Payer: BLUE CROSS/BLUE SHIELD | Admitting: Internal Medicine

## 2014-07-16 VITALS — BP 136/78 | HR 94 | Temp 98.1°F | Resp 12 | Ht 64.0 in | Wt 164.1 lb

## 2014-07-16 DIAGNOSIS — F411 Generalized anxiety disorder: Secondary | ICD-10-CM

## 2014-07-16 NOTE — Progress Notes (Signed)
Pre visit review using our clinic review tool, if applicable. No additional management support is needed unless otherwise documented below in the visit note. 

## 2014-07-16 NOTE — Patient Instructions (Signed)
Autism society of [place]: http://autismsociety-[place].org/index.php/get-help/direct-care/[place]-autism-society[place]of[place]-office  This is a website that lists the resources for autism and asperger's in [facility].  I would be happy to see Shea Stakes if he is willing to come in.   Come back to see Dr. Asa Lente as previously scheduled.   Stress and Stress Management Stress is a normal reaction to life events. It is what you feel when life demands more than you are used to or more than you can handle. Some stress can be useful. For example, the stress reaction can help you catch the last bus of the day, study for a test, or meet a deadline at work. But stress that occurs too often or for too long can cause problems. It can affect your emotional health and interfere with relationships and normal daily activities. Too much stress can weaken your immune system and increase your risk for physical illness. If you already have a medical problem, stress can make it worse. CAUSES  All sorts of life events may cause stress. An event that causes stress for one person may not be stressful for another person. Major life events commonly cause stress. These may be positive or negative. Examples include losing your job, moving into a new home, getting married, having a baby, or losing a loved one. Less obvious life events may also cause stress, especially if they occur day after day or in combination. Examples include working long hours, driving in traffic, caring for children, being in debt, or being in a difficult relationship. SIGNS AND SYMPTOMS Stress may cause emotional symptoms including, the following:  Anxiety. This is feeling worried, afraid, on edge, overwhelmed, or out of control.  Anger. This is feeling irritated or impatient.  Depression. This is feeling sad, down, helpless, or guilty.  Difficulty focusing, remembering, or making decisions. Stress may cause physical symptoms,  including the following:   Aches and pains. These may affect your head, neck, back, stomach, or other areas of your body.  Tight muscles or clenched jaw.  Low energy or trouble sleeping. Stress may cause unhealthy behaviors, including the following:   Eating to feel better (overeating) or skipping meals.  Sleeping too little, too much, or both.  Working too much or putting off tasks (procrastination).  Smoking, drinking alcohol, or using drugs to feel better. DIAGNOSIS  Stress is diagnosed through an assessment by your health care provider. Your health care provider will ask questions about your symptoms and any stressful life events.Your health care provider will also ask about your medical history and may order blood tests or other tests. Certain medical conditions and medicine can cause physical symptoms similar to stress. Mental illness can cause emotional symptoms and unhealthy behaviors similar to stress. Your health care provider may refer you to a mental health professional for further evaluation.  TREATMENT  Stress management is the recommended treatment for stress.The goals of stress management are reducing stressful life events and coping with stress in healthy ways.  Techniques for reducing stressful life events include the following:  Stress identification. Self-monitor for stress and identify what causes stress for you. These skills may help you to avoid some stressful events.  Time management. Set your priorities, keep a calendar of events, and learn to say "no." These tools can help you avoid making too many commitments. Techniques for coping with stress include the following:  Rethinking the problem. Try to think realistically about stressful events rather than ignoring them or overreacting. Try to find the positives in a stressful  situation rather than focusing on the negatives.  Exercise. Physical exercise can release both physical and emotional tension. The key is  to find a form of exercise you enjoy and do it regularly.  Relaxation techniques. These relax the body and mind. Examples include yoga, meditation, tai chi, biofeedback, deep breathing, progressive muscle relaxation, listening to music, being out in nature, journaling, and other hobbies. Again, the key is to find one or more that you enjoy and can do regularly.  Healthy lifestyle. Eat a balanced diet, get plenty of sleep, and do not smoke. Avoid using alcohol or drugs to relax.  Strong support network. Spend time with family, friends, or other people you enjoy being around.Express your feelings and talk things over with someone you trust. Counseling or talktherapy with a mental health professional may be helpful if you are having difficulty managing stress on your own. Medicine is typically not recommended for the treatment of stress.Talk to your health care provider if you think you need medicine for symptoms of stress. HOME CARE INSTRUCTIONS  Keep all follow-up visits as directed by your health care provider.  Take all medicines as directed by your health care provider. SEEK MEDICAL CARE IF:  Your symptoms get worse or you start having new symptoms.  You feel overwhelmed by your problems and can no longer manage them on your own. SEEK IMMEDIATE MEDICAL CARE IF:  You feel like hurting yourself or someone else. Document Released: 11/24/2000 Document Revised: 10/15/2013 Document Reviewed: 01/23/2013 [facility] Patient Information 2015 [place], [place]. This information is not intended to replace advice given to you by your health care provider. Make sure you discuss any questions you have with your health care provider.

## 2014-07-18 NOTE — Assessment & Plan Note (Signed)
Continues to see her therapist but increased stress due to issues with her son and his drinking habits. Counseled the patient about stress reduction and relaxation techniques with the patient.

## 2014-07-18 NOTE — Progress Notes (Signed)
   Subjective:    Patient ID: Jean Smith, female    DOB: 05/07/1951, 64 y.o.   MRN: [id number]  HPI The patient is a 64 YO female who is coming in for anxiety. She has been having a worsening state of anxiety which is severe for the last 3-4 weeks. Her son is the main source of her anxiety as he is high functioning autistic and did not come to a visit with a doctor. She is concerned about his drinking and habits and really wants him to get help. She has been using her xanax prn but this is only partially helpful. She has a therapist. She does have some social support but has a lot of stress right now. Denies SI/HI.   Review of Systems  Constitutional: Negative.   HENT: Negative.   Eyes: Positive for photophobia.  Respiratory: Negative.   Cardiovascular: Negative.   Gastrointestinal: Negative.   Genitourinary: Negative.   Musculoskeletal: Negative.   Skin: Negative.   Neurological: Negative.   Psychiatric/Behavioral: Positive for sleep disturbance and decreased concentration. Negative for suicidal ideas and self-injury. The patient is nervous/anxious. The patient is not hyperactive.       Objective:   Physical Exam  Constitutional: She is oriented to person, place, and time. She appears well-developed and well-nourished.  HENT:  Head: Normocephalic and atraumatic.  Eyes: EOM are normal.  Neck: Normal range of motion.  Cardiovascular: Normal rate and regular rhythm.   Pulmonary/Chest: Effort normal and breath sounds normal.  Abdominal: Soft. Bowel sounds are normal.  Musculoskeletal: Normal range of motion.  Neurological: She is alert and oriented to person, place, and time.  Skin: Skin is warm and dry.  Psychiatric: Her behavior is normal.   Filed Vitals:   07/16/14 1612  BP: 136/78  Pulse: 94  Temp: 98.1 F (36.7 C)  TempSrc: Oral  Resp: 12  Height: 5\' 4"  (1.626 m)  Weight: 164 lb 1.3 oz (74.426 kg)  SpO2: 98%      Assessment & Plan:   Spent 25 minutes on this  encounter, greater than 50% was face to face time with the patient.

## 2014-08-27 ENCOUNTER — Encounter: Payer: Self-pay | Admitting: Internal Medicine

## 2014-08-27 ENCOUNTER — Ambulatory Visit (INDEPENDENT_AMBULATORY_CARE_PROVIDER_SITE_OTHER): Payer: BLUE CROSS/BLUE SHIELD | Admitting: Internal Medicine

## 2014-08-27 ENCOUNTER — Other Ambulatory Visit (INDEPENDENT_AMBULATORY_CARE_PROVIDER_SITE_OTHER): Payer: BLUE CROSS/BLUE SHIELD

## 2014-08-27 VITALS — BP 136/84 | HR 72 | Temp 98.2°F | Resp 16 | Ht 64.0 in | Wt 163.0 lb

## 2014-08-27 DIAGNOSIS — Z0000 Encounter for general adult medical examination without abnormal findings: Secondary | ICD-10-CM

## 2014-08-27 DIAGNOSIS — Z23 Encounter for immunization: Secondary | ICD-10-CM

## 2014-08-27 DIAGNOSIS — I868 Varicose veins of other specified sites: Secondary | ICD-10-CM

## 2014-08-27 DIAGNOSIS — I8390 Asymptomatic varicose veins of unspecified lower extremity: Secondary | ICD-10-CM | POA: Insufficient documentation

## 2014-08-27 LAB — BASIC METABOLIC PANEL
BUN: 14 mg/dL (ref 6–23)
CHLORIDE: 101 meq/L (ref 96–112)
CO2: 33 mEq/L — ABNORMAL HIGH (ref 19–32)
Calcium: 9.7 mg/dL (ref 8.4–10.5)
Creatinine, Ser: 0.81 mg/dL (ref 0.40–1.20)
GFR: 76.19 mL/min (ref >60.00)
Glucose, Bld: 99 mg/dL (ref 70–99)
POTASSIUM: 4 meq/L (ref 3.5–5.1)
SODIUM: 138 meq/L (ref 135–145)

## 2014-08-27 LAB — URINALYSIS, ROUTINE W REFLEX MICROSCOPIC
Bilirubin Urine: NEGATIVE
Hgb urine dipstick: NEGATIVE
Ketones, ur: NEGATIVE
Leukocytes, UA: NEGATIVE
Nitrite: NEGATIVE
RBC / HPF: NONE SEEN (ref 0–?)
Specific Gravity, Urine: 1.01 (ref 1.000–1.030)
TOTAL PROTEIN, URINE-UPE24: NEGATIVE
URINE GLUCOSE: NEGATIVE
Urobilinogen, UA: 0.2 (ref 0.0–1.0)
WBC, UA: NONE SEEN (ref 0–?)
pH: 7.5 (ref 5.0–8.0)

## 2014-08-27 LAB — HEPATIC FUNCTION PANEL
ALT: 12 U/L (ref 0–35)
AST: 17 U/L (ref 0–37)
Albumin: 4.6 g/dL (ref 3.5–5.2)
Alkaline Phosphatase: 57 U/L (ref 39–117)
BILIRUBIN DIRECT: 0.1 mg/dL (ref 0.0–0.3)
Total Bilirubin: 0.5 mg/dL (ref 0.2–1.2)
Total Protein: 7.6 g/dL (ref 6.0–8.3)

## 2014-08-27 LAB — LIPID PANEL
CHOL/HDL RATIO: 4
CHOLESTEROL: 292 mg/dL — AB (ref 0–200)
HDL: 77.9 mg/dL (ref >39.00)
LDL Cholesterol: 201 mg/dL — ABNORMAL HIGH (ref 0–99)
NonHDL: 214.1
Triglycerides: 68 mg/dL (ref 0.0–149.0)
VLDL: 13.6 mg/dL (ref 0.0–40.0)

## 2014-08-27 LAB — CBC WITH DIFFERENTIAL/PLATELET
Basophils Absolute: 0 10*3/uL (ref 0.0–0.1)
Basophils Relative: 0.4 % (ref 0.0–3.0)
EOS PCT: 0.8 % (ref 0.0–5.0)
Eosinophils Absolute: 0 10*3/uL (ref 0.0–0.7)
HCT: 42.5 % (ref 36.0–46.0)
Hemoglobin: 14.4 g/dL (ref 12.0–15.0)
Lymphocytes Relative: 27.4 % (ref 12.0–46.0)
Lymphs Abs: 1.5 10*3/uL (ref 0.7–4.0)
MCHC: 34 g/dL (ref 30.0–36.0)
MCV: 85.3 fl (ref 78.0–100.0)
Monocytes Absolute: 0.4 10*3/uL (ref 0.1–1.0)
Monocytes Relative: 7 % (ref 3.0–12.0)
NEUTROS PCT: 64.4 % (ref 43.0–77.0)
Neutro Abs: 3.4 10*3/uL (ref 1.4–7.7)
PLATELETS: 253 10*3/uL (ref 150.0–400.0)
RBC: 4.99 Mil/uL (ref 3.87–5.11)
RDW: 14.1 % (ref 11.5–15.5)
WBC: 5.3 10*3/uL (ref 4.0–10.5)

## 2014-08-27 LAB — TSH: TSH: 1.83 u[IU]/mL (ref 0.35–4.50)

## 2014-08-27 LAB — VITAMIN D 25 HYDROXY (VIT D DEFICIENCY, FRACTURES): VITD: 24.53 ng/mL — AB (ref 30.00–100.00)

## 2014-08-27 LAB — HEPATITIS C ANTIBODY: HCV AB: NEGATIVE

## 2014-08-27 MED ORDER — ALPRAZOLAM 0.25 MG PO TABS: 0.2500 mg | ORAL_TABLET | Freq: Three times a day (TID) | ORAL | Status: DC

## 2014-08-27 NOTE — Patient Instructions (Signed)
It was good to see you today.  We have reviewed your prior records including labs and tests today  Health Maintenance reviewed - Tdap (tetanus diphtheria and pertussis) immunizations updated today. Check with your insurance about Zostavax (shingles vaccine) coverage All other recommended immunizations and age-appropriate screenings are up-to-date.  Test(s) ordered today. Your results will be released to [place] (or called to you) after review, usually within 72hours after test completion. If any changes need to be made, you will be notified at that same time.  we'll make referral to vascular specialist for evaluation and treatment of your spider veins as requested . Our office will contact you regarding appointment(s) once made.  Medications reviewed and updated, no changes recommended at this time. Refill on medication(s) as discussed today.  Please schedule followup in 12 months for annual exam and labs, call sooner if problems.

## 2014-08-27 NOTE — Progress Notes (Signed)
Pre visit review using our clinic review tool, if applicable. No additional management support is needed unless otherwise documented below in the visit note. 

## 2014-08-27 NOTE — Assessment & Plan Note (Signed)
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

## 2014-08-27 NOTE — Progress Notes (Signed)
Subjective:    Patient ID: Jean Smith, female    DOB: [birth year], 64 y.o.   MRN: [id number]  HPI  patient is here today for annual physical. Patient feels well and has no complaints.  Also reviewed chronic conditions, interval events and current concerns  Past Medical History  Diagnosis Date  . Anxiety   . Mitral valve prolapse   . Hyperlipidemia   . PVC's (premature ventricular contractions)     intol of BB, sxc palpitations  . Hyperplastic colon polyp   . Diverticulosis   . RVOT ventricular tachycardia/PVCs     EP eval 01/2013 for freq PVCs  . Fuchs' endothelial dystrophy     follows with optho regularly    Family History  Problem Relation Age of Onset  . Colon cancer Mother   . Colon cancer Maternal Grandmother   . Colon polyps Sister   . Heart disease Father   . Alcohol abuse     History  Substance Use Topics  . Smoking status: Former Smoker    Quit date: 06/15/1983  . Smokeless tobacco: Never Used  . Alcohol Use: Yes     Comment: rare    Review of Systems  Constitutional: Negative for fatigue and unexpected weight change.  Respiratory: Negative for cough, shortness of breath and wheezing.   Cardiovascular: Negative for chest pain, palpitations and leg swelling.  Gastrointestinal: Positive for abdominal distention (feels swollen since 05/2014). Negative for nausea, abdominal pain and diarrhea.  Neurological: Negative for dizziness, weakness, light-headedness and headaches.  Psychiatric/Behavioral: Positive for sleep disturbance, dysphoric mood and decreased concentration. Negative for suicidal ideas and self-injury. The patient is not nervous/anxious.   All other systems reviewed and are negative.      Objective:    Physical Exam  Constitutional: She is oriented to person, place, and time. She appears well-developed and well-nourished. No distress.  HENT:  Head: Normocephalic and atraumatic.  Right Ear: External ear normal.  Left Ear: External ear  normal.  Nose: Nose normal.  Mouth/Throat: Oropharynx is clear and moist. No oropharyngeal exudate.  Eyes: EOM are normal. Pupils are equal, round, and reactive to light. Right eye exhibits no discharge. Left eye exhibits no discharge. No scleral icterus.  Neck: Normal range of motion. Neck supple. No JVD present. No tracheal deviation present. No thyromegaly present.  Cardiovascular: Normal rate, regular rhythm, normal heart sounds and intact distal pulses.  Exam reveals no friction rub.   No murmur heard. Pulmonary/Chest: Effort normal and breath sounds normal. No respiratory distress. She has no wheezes. She has no rales. She exhibits no tenderness.  Abdominal: Soft. Bowel sounds are normal. She exhibits no distension and no mass. There is no tenderness. There is no rebound and no guarding.  Genitourinary:  Defer to gyn  Musculoskeletal: Normal range of motion.  No gross deformities  Lymphadenopathy:    She has no cervical adenopathy.  Neurological: She is alert and oriented to person, place, and time. She has normal reflexes. No cranial nerve deficit.  Skin: Skin is warm and dry. No rash noted. She is not diaphoretic. No erythema.  Fine spider varicose veins BLE  Psychiatric: She has a normal mood and affect. Her behavior is normal. Judgment and thought content normal.  Nursing note and vitals reviewed.   BP 136/84 mmHg  Pulse 72  Temp(Src) 98.2 F (36.8 C) (Oral)  Resp 16  Ht 5\' 4"  (1.626 m)  Wt 163 lb (73.936 kg)  BMI 27.97 kg/m2  SpO2 98%  Wt Readings from Last 3 Encounters:  08/27/14 163 lb (73.936 kg)  07/16/14 164 lb 1.3 oz (74.426 kg)  11/12/13 160 lb (72.576 kg)     Lab Results  Component Value Date   WBC 5.3 02/09/2013   HGB 14.0 02/09/2013   HCT 40.5 02/09/2013   PLT 235.0 02/09/2013   GLUCOSE 128* 02/09/2013   CHOL 257* 01/23/2013   TRIG 157 01/23/2013   HDL 69 01/23/2013   LDLCALC 157 01/23/2013   ALT 19 02/09/2013   AST 21 02/09/2013   NA 138  02/09/2013   K 3.9 02/09/2013   CL 101 02/09/2013   CREATININE 0.7 02/09/2013   BUN 14 02/09/2013   CO2 30 02/09/2013   TSH 1.58 02/09/2013   HGBA1C 5.9 01/23/2013    Dg Knee Bilateral Standing Ap  10/30/2013   CLINICAL DATA:  Right knee pain and swelling.  EXAM: BILATERAL KNEES STANDING - 1 VIEW  COMPARISON:  None.  FINDINGS: There is no fracture or dislocation. There is slight medial joint space narrowing on the right as compared to the left. No marginal osteophytes on either side.  IMPRESSION: Slight narrowing of the medial compartment of the right knee consistent with early degenerative arthritic changes.   Electronically Signed   By: Rozetta Nunnery M.D.   On: 10/30/2013 15:20   [place] Extrem Low Right Ltd  10/31/2013   MSK US performed of: Right knee  This study was ordered, performed, and interpreted by Charlann Boxer D.O.  Knee:  All structures visualized.  Anteromedial, anterolateral, posteromedial, and posterolateral menisci  unremarkable without tearing, fraying, effusion, or displacement. Patient  does have some degenerative changes of the meniscus as well as narrowing  of the medial joint lines bilaterally.  Patellar Tendon unremarkable on long and transverse views without  effusion.  No abnormality of prepatellar bursa.  LCL and MCL unremarkable on long and transverse views.  No abnormality of origin of medial or lateral head of the gastrocnemius.  Patient's vastus lateralis to does have what appears to be a resolving  hematoma and likely tear right at the muscular tendon junction.   10/31/2013   Mild osteoarthritis with quadricep injury       Assessment & Plan:   CPX/z00.00 - Patient has been counseled on age-appropriate routine health concerns for screening and prevention. These are reviewed and up-to-date. Immunizations are up-to-date or declined. Labs ordered and reviewed.  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

## 2014-08-28 LAB — HIV ANTIBODY (ROUTINE TESTING W REFLEX): HIV: NONREACTIVE

## 2014-09-17 ENCOUNTER — Ambulatory Visit: Payer: BLUE CROSS/BLUE SHIELD | Admitting: *Deleted

## 2014-09-23 ENCOUNTER — Encounter: Payer: Self-pay | Admitting: *Deleted

## 2014-09-24 ENCOUNTER — Ambulatory Visit: Payer: BLUE CROSS/BLUE SHIELD | Admitting: *Deleted

## 2014-10-18 ENCOUNTER — Encounter: Payer: Self-pay | Admitting: Cardiovascular Disease

## 2014-10-18 ENCOUNTER — Ambulatory Visit (INDEPENDENT_AMBULATORY_CARE_PROVIDER_SITE_OTHER): Payer: BLUE CROSS/BLUE SHIELD | Admitting: Cardiovascular Disease

## 2014-10-18 ENCOUNTER — Telehealth: Payer: Self-pay | Admitting: Cardiovascular Disease

## 2014-10-18 VITALS — BP 146/92 | HR 100 | Ht 64.0 in | Wt 159.4 lb

## 2014-10-18 DIAGNOSIS — I493 Ventricular premature depolarization: Secondary | ICD-10-CM | POA: Diagnosis not present

## 2014-10-18 DIAGNOSIS — R002 Palpitations: Secondary | ICD-10-CM

## 2014-10-18 MED ORDER — PROPRANOLOL HCL 10 MG PO TABS: 10.0000 mg | ORAL_TABLET | Freq: Four times a day (QID) | ORAL | Status: DC | PRN

## 2014-10-18 NOTE — Patient Instructions (Signed)
Medication Instructions:  Your physician recommends that you continue on your current medications as directed. Please refer to the Current Medication list given to you today.   Labwork: None  Testing/Procedures: None  Follow-Up: Your physician wants you to follow-up in: 1 year with Dr. Nahser.  You will receive a reminder letter in the mail two months in advance. If you don't receive a letter, please call our office to schedule the follow-up appointment.      

## 2014-10-18 NOTE — Progress Notes (Signed)
Cardiology Office Note   Date:  10/18/2014   ID:  Jean Smith, DOB 06-20-1950, MRN [id number]  PCP:  Olga Millers, MD  Cardiologist:   Thayer Headings, MD   Chief Complaint  Patient presents with  . Palpitations   Jean Smith is seen back today for a work in visit with Cecille Rubin several months ago . S . Not been seen since April of 2012. She has MVP and palpitations. Lots of reported stress from a son who is autistic. Normal stress echo from 2009 with normal LV function and MVP noted.   Called last week with dizziness and PVC's. Had been given Diflucan. Thought this was the cause of her symptoms. PVC's on EKG noted. Given metoprolol as well.   Diflucan can cause nausea, HA, abdominal pain, diarrhea, dyspepsia, dizziness and serious reactions include QT prolongation and torsades.   Comes in today. She is here alone. Says she has been "sick" and lightheaded for the last month or so. Had a normal physical in May. Normal labs. Had a skin problem and given prescription for Diflucan that she did not take. Was walking last week and felt "so bad" but not able to really define. Some intermittent chest pressure. Decreased exercise tolerance reported. Felt the PVC's more. Was thinking she had diabetes or a UTI. Went back to her primary provider - saw the NP. She tells me that the NP was upset with her for not taking the diflucan even though her skin problem had improved. She then took one dose. The following day was at the movies and had worsening PVC's. Had to leave. Went to Urgent Care. Negative evaluation. No UTI. Not diabetic. Tries to restrict her caffeine but likes chocolate and does drink tea. Had alcohol last night and this also makes it worse. She admits to lots of stress. Did not take the Metoprolol. She is worried about her copper and Mg levels.   December 11, 2012:   Jean Smith presented with a dizzy episode while walking. She felt better later that day. She has continued to have some  dizziness since that time - 4 weeks total.   She has been having increasing PVCs and had 1 episode of near syncope while watching a moview. She saw Cecille Rubin and had a 24 hr Holter monitor. That day was a very stressful day and she had lots of palpitations. She had several days of feeling poorly. The 24 HR monitor showed, PVCs and several runs of NSVT and she was started on Metoprolol. She felt very poorly on the metoprolol.   She had a stress echo after her last visit with Cecille Rubin - the stress echo was read out as mild - moderate anterior septal wall motion abnormality - concerning for ischemia. I think the stress echo looks WNL.  Her BP has been low.   Sept. 4, 2014:  Since the last saw Jean Smith she has continued to have lots of premature ventricular contractions. She's had episodes of nonsustained ventricular tachycardia. A stress echo revealed a question of an anterior wall defect but a subsequent followup stress Myoview study revealed no evidence of ischemia.  She has been seen in consultation by Dr. Caryl Comes. He reassured her that the presence of PVCs and nonsustained ventricular tachycardia in the setting of a normal left ventricle was a very low risk situation. He can sit or starting flecainide for her premature ventricular contractions. He ruled out POTS.   For the past week she has had a low grade  fever and thought she had a virus. She has had a 24 hour urine for catecholamines which was negtive. She collected over 4 liters of urine during that time and had to use mason jars. The 24 hour collection was not suitable and will need to be done again. She has had some facial flushing and red rash at time.   She has not been out of the house much. The propranolol makes her feel poorly (heavy chest , constipation). She related 1 episode when she took a propranolol and then went out the the rock / [employer]. She has had fatigue and had to stop and rest frequently. She has had  lots of muscle aches, has taken some motrin.   September 14, 2013:  Jean Smith is doing well. She happy that her daughter got into the graduate program at [employer]. She seems to be stable form a cardiac standpoint. She notices that she has some palps if she is tired or is hungry   Oct 18, 2014:  Jean Smith is a 64 y.o. female who presents for  Palpitations .  She has been sick.  Had some dental work complicated by a headache.   Has had a viral decongestants, benadrly.  Has been drinking sweat tea all afternoon and eats lots of chocolate and chocolate pudding at night. Has had more PVCs.  She occasionally will take the propranolol and states this but this does make her feel better but she does not like to take medicine show she avoids taking it for the most part.   Past Medical History  Diagnosis Date  . Anxiety   . Mitral valve prolapse   . Hyperlipidemia   . PVC's (premature ventricular contractions)     intol of BB, sxc palpitations  . Hyperplastic colon polyp   . Diverticulosis   . RVOT ventricular tachycardia/PVCs     EP eval 01/2013 for freq PVCs  . Fuchs' endothelial dystrophy     follows with optho regularly     Past Surgical History  Procedure Laterality Date  . Mandible surgery    . Knee surgery Right   . Cesarean section      x 3     Current Outpatient Prescriptions  Medication Sig Dispense Refill  . ALPRAZolam (XANAX) 0.25 MG tablet Take 1 tablet (0.25 mg total) by mouth 3 (three) times daily. 90 tablet 5  . loratadine (CLARITIN) 10 MG tablet Take 10 mg by mouth daily as needed for allergies.     . Magnesium Oxide 200 MG TABS Take 1-2 tablets by mouth daily.    . propranolol (INDERAL) 10 MG tablet Take 10 mg by mouth as needed.     No current facility-administered medications for this visit.    Allergies:   Atorvastatin; Crestor; Latex; Lipitor; Other; Pollen extract; and Vitamin d analogs    Social History:  The patient  reports that she quit smoking about 31  years ago. She has never used smokeless tobacco. She reports that she drinks alcohol. She reports that she does not use illicit drugs.   Family History:  The patient's family history includes Alcohol abuse in an other family member; Colon cancer in her maternal grandmother and mother; Colon polyps in her sister; Heart disease in her father.    ROS:  Please see the history of present illness.    Review of Systems: Constitutional:  denies fever, chills, diaphoresis, appetite change and fatigue.  HEENT: denies photophobia, eye pain, redness, hearing loss, ear pain,  congestion, sore throat, rhinorrhea, sneezing, neck pain, neck stiffness and tinnitus.  Respiratory: denies SOB, DOE, cough, chest tightness, and wheezing.  Cardiovascular: denies chest pain, palpitations and leg swelling.  Gastrointestinal: denies nausea, vomiting, abdominal pain, diarrhea, constipation, blood in stool.  Genitourinary: denies dysuria, urgency, frequency, hematuria, flank pain and difficulty urinating.  Musculoskeletal: denies  myalgias, back pain, joint swelling, arthralgias and gait problem.   Skin: denies pallor, rash and wound.  Neurological: denies dizziness, seizures, syncope, weakness, light-headedness, numbness and headaches.   Hematological: denies adenopathy, easy bruising, personal or family bleeding history.  Psychiatric/ Behavioral: denies suicidal ideation, mood changes, confusion, nervousness, sleep disturbance and agitation.       All other systems are reviewed and negative.    PHYSICAL EXAM: VS:  BP 146/92 mmHg  Pulse 100  Ht 5\' 4"  (1.626 m)  Wt 159 lb 6.4 oz (72.303 kg)  BMI 27.35 kg/m2 , BMI Body mass index is 27.35 kg/(m^2). GEN: Well nourished, well developed, in no acute distress HEENT: normal Neck: no JVD, carotid bruits, or masses Cardiac: RRR;  Occasional PVC.  no murmurs, rubs, or gallops,no edema  Respiratory:  clear to auscultation bilaterally, normal work of breathing GI:  soft, nontender, nondistended, + BS MS: no deformity or atrophy Skin: warm and dry, no rash Neuro:  Strength and sensation are intact Psych: normal   EKG:  EKG is ordered today. The ekg ordered today demonstrates  NSR at 100.  Occasional PVCs , NS ST abn.    Recent Labs: 08/27/2014: ALT 12; BUN 14; Creatinine 0.81; Hemoglobin 14.4; Platelets 253.0; Potassium 4.0; Sodium 138; TSH 1.83    Lipid Panel    Component Value Date/Time   CHOL 292* 08/27/2014 1130   TRIG 68.0 08/27/2014 1130   HDL 77.90 08/27/2014 1130   CHOLHDL 4 08/27/2014 1130   VLDL 13.6 08/27/2014 1130   LDLCALC 201* 08/27/2014 1130      Wt Readings from Last 3 Encounters:  10/18/14 159 lb 6.4 oz (72.303 kg)  08/27/14 163 lb (73.936 kg)  07/16/14 164 lb 1.3 oz (74.426 kg)      Other studies Reviewed: Additional studies/ records that were reviewed today include: . Review of the above records demonstrates:    ASSESSMENT AND PLAN:  1.  Premature ventricular contractions: Jean Smith is having lots of premature ventricular contractions. She's drinking caffeinated tea all afternoon. She also eats lots of chocolate and chocolate pudding at night. I suspect that her PVCs are related to her caffeine intake. I've cautioned her about cutting on her caffeine. I've encouraged her to use propranolol as needed. Advised her to exercise on a regular basis.   Current medicines are reviewed at length with the patient today.  The patient does not have concerns regarding medicines.  The following changes have been made:  no change  Labs/ tests ordered today include:  No orders of the defined types were placed in this encounter.     Disposition:   FU with me in 1 year.      Nahser, Wonda Cheng, MD  10/18/2014 4:52 PM    [facility] [place], [place], [place]  [address] Phone: [phone]; Fax: [phone]   [facility]  [address] [place], [place]  [address] [phone]    Fax [phone]

## 2014-10-18 NOTE — Telephone Encounter (Signed)
New Message  Pt called to make appt w/ Nahser or APP; Pt c/o of drastic heart rate changes and possible PVC's. Pt was made appt w/ next available APP- 6/2 @ 11:45 w/ Dayna. Pt requested to speak w/ Rn to see if this appt was okay and what to do going forward. Please call back and discuss.

## 2014-10-18 NOTE — Telephone Encounter (Signed)
Patient reports BP 117/70 today and has been within normal limits recently.

## 2014-10-18 NOTE — Telephone Encounter (Signed)
Spoke with patient who states over the past few weeks she has had very irregular pulse; states BP monitor has been measuring in the 50's bpm and has been showing symbol for irregular.  Patient states she can increase rate to 70's with coughing but it returns to the 50's.  Patient c/o worsening palpitations and chest discomfort.  States she does not take propranolol very frequently; takes 1/2 tab occasionally.  I was able to schedule patient to come in this afternoon to see Dr. Acie Fredrickson.  Patient verbalized understanding and agreement.

## 2014-10-24 ENCOUNTER — Telehealth: Payer: Self-pay | Admitting: Internal Medicine

## 2014-10-24 NOTE — Telephone Encounter (Signed)
Left message for patient to call me

## 2014-10-24 NOTE — Telephone Encounter (Signed)
Patient called stating that her dog was DX with giardia. She is requesting to have lab order put in for screening giardia. Please give the patient a call to advise.

## 2014-10-24 NOTE — Telephone Encounter (Signed)
Is she having any symptoms? If not then no need for testing.

## 2014-10-25 NOTE — Telephone Encounter (Signed)
Would not recommend any testing at this time. Giardia in human causes watery diarrhea so if she is not having that then she does not need giardia testing. If she wants to schedule a visit to discuss that's fine.

## 2014-10-25 NOTE — Telephone Encounter (Signed)
Patient called back. Please call her on her home phone. If she does not answer please call on her cell.

## 2014-10-25 NOTE — Telephone Encounter (Signed)
Patient says she has a lot of gas and when she eats she has to go straight to the bathroom. It is not always diarrhea. She has some cramping and bloating. She said she has to go to the bathroom as soon as she wakes up and throughout the day. She says she has had GI distress for about a month, but its not constant diarrhea. She says her stomach does not feel normal though. The dog was just diagnosed recently, but does not have diarrhea. The patient says she has been having these symptoms for about a month ago. She said she thought it was her beta blockers, so she stopped taking them. Do you want to put in orders for any testing? Please advise, thanks.

## 2014-10-25 NOTE — Telephone Encounter (Signed)
Left message for patient to call me back. 

## 2014-10-25 NOTE — Telephone Encounter (Signed)
Note below. Her cell is [phone]

## 2014-11-14 ENCOUNTER — Ambulatory Visit: Payer: BLUE CROSS/BLUE SHIELD | Admitting: Physician Assistant

## 2015-03-28 ENCOUNTER — Encounter: Payer: Self-pay | Admitting: Internal Medicine

## 2015-04-15 ENCOUNTER — Ambulatory Visit (INDEPENDENT_AMBULATORY_CARE_PROVIDER_SITE_OTHER)
Admission: RE | Admit: 2015-04-15 | Discharge: 2015-04-15 | Disposition: A | Discharge status: Home / Self Care | Payer: BLUE CROSS/BLUE SHIELD | Source: Ambulatory Visit | Attending: Internal Medicine | Admitting: Internal Medicine

## 2015-04-15 ENCOUNTER — Ambulatory Visit (INDEPENDENT_AMBULATORY_CARE_PROVIDER_SITE_OTHER): Payer: BLUE CROSS/BLUE SHIELD | Admitting: Internal Medicine

## 2015-04-15 ENCOUNTER — Encounter: Payer: Self-pay | Admitting: Internal Medicine

## 2015-04-15 ENCOUNTER — Other Ambulatory Visit (INDEPENDENT_AMBULATORY_CARE_PROVIDER_SITE_OTHER): Payer: BLUE CROSS/BLUE SHIELD

## 2015-04-15 VITALS — BP 110/78 | HR 108 | Temp 98.4°F | Resp 16 | Ht 64.0 in | Wt 147.6 lb

## 2015-04-15 DIAGNOSIS — R10814 Left lower quadrant abdominal tenderness: Secondary | ICD-10-CM

## 2015-04-15 DIAGNOSIS — K5732 Diverticulitis of large intestine without perforation or abscess without bleeding: Secondary | ICD-10-CM | POA: Diagnosis not present

## 2015-04-15 DIAGNOSIS — R1032 Left lower quadrant pain: Secondary | ICD-10-CM | POA: Insufficient documentation

## 2015-04-15 LAB — COMPREHENSIVE METABOLIC PANEL
ALBUMIN: 4.2 g/dL (ref 3.5–5.2)
ALT: 12 U/L (ref 0–35)
AST: 16 U/L (ref 0–37)
Alkaline Phosphatase: 55 U/L (ref 39–117)
BUN: 10 mg/dL (ref 6–23)
CHLORIDE: 101 meq/L (ref 96–112)
CO2: 32 mEq/L (ref 19–32)
CREATININE: 0.79 mg/dL (ref 0.40–1.20)
Calcium: 9.8 mg/dL (ref 8.4–10.5)
GFR: 78.26 mL/min (ref >60.00)
GLUCOSE: 105 mg/dL — AB (ref 70–99)
Potassium: 4.7 mEq/L (ref 3.5–5.1)
Sodium: 140 mEq/L (ref 135–145)
TOTAL PROTEIN: 7.7 g/dL (ref 6.0–8.3)
Total Bilirubin: 0.8 mg/dL (ref 0.2–1.2)

## 2015-04-15 LAB — CBC WITH DIFFERENTIAL/PLATELET
BASOS PCT: 0.4 % (ref 0.0–3.0)
Basophils Absolute: 0 10*3/uL (ref 0.0–0.1)
EOS ABS: 0.1 10*3/uL (ref 0.0–0.7)
Eosinophils Relative: 1 % (ref 0.0–5.0)
HCT: 41.8 % (ref 36.0–46.0)
Hemoglobin: 14 g/dL (ref 12.0–15.0)
Lymphocytes Relative: 18.9 % (ref 12.0–46.0)
Lymphs Abs: 1.9 10*3/uL (ref 0.7–4.0)
MCHC: 33.4 g/dL (ref 30.0–36.0)
MCV: 86.4 fl (ref 78.0–100.0)
MONO ABS: 0.6 10*3/uL (ref 0.1–1.0)
Monocytes Relative: 6.6 % (ref 3.0–12.0)
NEUTROS PCT: 73.1 % (ref 43.0–77.0)
Neutro Abs: 7.2 10*3/uL (ref 1.4–7.7)
Platelets: 257 10*3/uL (ref 150.0–400.0)
RBC: 4.84 Mil/uL (ref 3.87–5.11)
RDW: 14.2 % (ref 11.5–15.5)
WBC: 9.8 10*3/uL (ref 4.0–10.5)

## 2015-04-15 LAB — TSH: TSH: 1.05 u[IU]/mL (ref 0.35–4.50)

## 2015-04-15 LAB — LIPASE: LIPASE: 15 U/L (ref 11.0–59.0)

## 2015-04-15 LAB — C-REACTIVE PROTEIN: CRP: 8.7 mg/dL (ref 0.5–20.0)

## 2015-04-15 LAB — AMYLASE: AMYLASE: 34 U/L (ref 27–131)

## 2015-04-15 MED ORDER — IOHEXOL 300 MG/ML  SOLN
100.0000 mL | Freq: Once | INTRAMUSCULAR | Status: AC | PRN
  Administered 2015-04-15: 100 mL via INTRAVENOUS

## 2015-04-15 MED ORDER — AMOXICILLIN-POT CLAVULANATE 875-125 MG PO TABS: 1.0000 | ORAL_TABLET | Freq: Two times a day (BID) | ORAL | Status: DC

## 2015-04-15 NOTE — Progress Notes (Signed)
Subjective:  Patient ID: Jean Smith, female    DOB: Feb 18, 1951  Age: 64 y.o. MRN: [id number]  CC: Abdominal Pain  New to me  HPI Jean Smith presents for left lower quadrant abdominal pain. About 2-1/2 weeks ago she felt like she had a stomach flu with nausea, diarrhea and stomach cramps. Most of those symptoms have resolved but now for the last few days she has had crampy pain in her left lower quadrant of her abdomen. Sometimes the pain radiates to the right side. She also reports low-grade fever and chills. She is not seeing any blood in her stool.  Outpatient Prescriptions Prior to Visit  Medication Sig Dispense Refill  . ALPRAZolam (XANAX) 0.25 MG tablet Take 1 tablet (0.25 mg total) by mouth 3 (three) times daily. 90 tablet 5  . loratadine (CLARITIN) 10 MG tablet Take 10 mg by mouth daily as needed for allergies.     . Magnesium Oxide 200 MG TABS Take 1-2 tablets by mouth daily.    . propranolol (INDERAL) 10 MG tablet Take 1 tablet (10 mg total) by mouth 4 (four) times daily as needed. (Patient not taking: Reported on 04/15/2015) 30 tablet 6   No facility-administered medications prior to visit.    ROS Review of Systems  Constitutional: Positive for fever and chills. Negative for diaphoresis, activity change, appetite change, fatigue and unexpected weight change.  HENT: Negative.   Eyes: Negative.   Respiratory: Negative.  Negative for cough, choking and shortness of breath.   Cardiovascular: Negative.  Negative for palpitations and leg swelling.  Gastrointestinal: Positive for abdominal pain. Negative for nausea, vomiting, diarrhea, constipation and blood in stool.  Endocrine: Negative.   Genitourinary: Negative.  Negative for dysuria, frequency, hematuria, enuresis, difficulty urinating and genital sores.  Musculoskeletal: Negative.  Negative for myalgias, back pain, joint swelling and arthralgias.  Skin: Negative.   Allergic/Immunologic: Negative.   Neurological:  Negative.   Hematological: Negative.  Negative for adenopathy. Does not bruise/bleed easily.  Psychiatric/Behavioral: Negative.     Objective:  BP 110/78 mmHg  Pulse 108  Temp(Src) 98.4 F (36.9 C) (Oral)  Resp 16  Ht 5\' 4"  (1.626 m)  Wt 147 lb 9.6 oz (66.951 kg)  BMI 25.32 kg/m2  SpO2 98%  BP Readings from Last 3 Encounters:  04/15/15 110/78  10/18/14 146/92  08/27/14 136/84    Wt Readings from Last 3 Encounters:  04/15/15 147 lb 9.6 oz (66.951 kg)  10/18/14 159 lb 6.4 oz (72.303 kg)  08/27/14 163 lb (73.936 kg)    Physical Exam  Constitutional: She is oriented to person, place, and time.  Non-toxic appearance. She does not have a sickly appearance. She does not appear ill. No distress.  HENT:  Mouth/Throat: Oropharynx is clear and moist. No oropharyngeal exudate.  Eyes: Conjunctivae are normal. Right eye exhibits no discharge. Left eye exhibits no discharge. No scleral icterus.  Neck: Normal range of motion. Neck supple. No JVD present. No tracheal deviation present. No thyromegaly present.  Cardiovascular: Normal rate, regular rhythm, normal heart sounds and intact distal pulses.  Exam reveals no gallop and no friction rub.   No murmur heard. Pulmonary/Chest: Effort normal and breath sounds normal. No stridor. No respiratory distress. She has no wheezes. She has no rales. She exhibits no tenderness.  Abdominal: Soft. Normal appearance. She exhibits no shifting dullness, no distension, no ascites and no mass. Bowel sounds are increased. There is no hepatosplenomegaly. There is tenderness in the left lower quadrant.  There is guarding. There is no rigidity, no rebound, no CVA tenderness, no tenderness at McBurney's point and negative Murphy's sign.  Musculoskeletal: Normal range of motion. She exhibits no edema or tenderness.  Lymphadenopathy:    She has no cervical adenopathy.  Neurological: She is oriented to person, place, and time.  Skin: Skin is warm and dry. No rash  noted. She is not diaphoretic. No erythema. No pallor.    Lab Results  Component Value Date   WBC 9.8 04/15/2015   HGB 14.0 04/15/2015   HCT 41.8 04/15/2015   PLT 257.0 04/15/2015   GLUCOSE 105* 04/15/2015   CHOL 292* 08/27/2014   TRIG 68.0 08/27/2014   HDL 77.90 08/27/2014   LDLCALC 201* 08/27/2014   ALT 12 04/15/2015   AST 16 04/15/2015   NA 140 04/15/2015   K 4.7 04/15/2015   CL 101 04/15/2015   CREATININE 0.79 04/15/2015   BUN 10 04/15/2015   CO2 32 04/15/2015   TSH 1.05 04/15/2015   HGBA1C 5.9 01/23/2013    Dg Knee Bilateral Standing Ap  10/30/2013  CLINICAL DATA:  Right knee pain and swelling. EXAM: BILATERAL KNEES STANDING - 1 VIEW COMPARISON:  None. FINDINGS: There is no fracture or dislocation. There is slight medial joint space narrowing on the right as compared to the left. No marginal osteophytes on either side. IMPRESSION: Slight narrowing of the medial compartment of the right knee consistent with early degenerative arthritic changes. Electronically Signed   By: Rozetta Nunnery M.D.   On: 10/30/2013 15:20   [place] Extrem Low Right Ltd  10/31/2013  [facility] US performed of: Right knee This study was ordered, performed, and interpreted by Charlann Boxer D.O. Knee: All structures visualized. Anteromedial, anterolateral, posteromedial, and posterolateral menisci unremarkable without tearing, fraying, effusion, or displacement. Patient does have some degenerative changes of the meniscus as well as narrowing of the medial joint lines bilaterally. Patellar Tendon unremarkable on long and transverse views without effusion. No abnormality of prepatellar bursa. LCL and MCL unremarkable on long and transverse views. No abnormality of origin of medial or lateral head of the gastrocnemius. Patient's vastus lateralis to does have what appears to be a resolving hematoma and likely tear right at the muscular tendon junction.   10/31/2013  Mild osteoarthritis with quadricep injury    Assessment &  Plan:   Kristyl was seen today for abdominal pain.  Diagnoses and all orders for this visit:  Abdominal tenderness, LLQ (left lower quadrant)- her labs are unremarkable, the examination is suspicious for diverticulitis so a CT scan was ordered. It is positive for a small area of diverticulitis so will start Augmentin to treat the infection. -     Lipase; Future -     CBC with Differential/Platelet; Future -     Comprehensive metabolic panel; Future -     Amylase; Future -     TSH; Future -     C-reactive protein; Future -     Cancel: CT Abdomen Pelvis Wo Contrast; Future -     CT Abdomen Pelvis W Contrast; Future  Diverticulitis of colon without hemorrhage- will treat the infection with augmentin -     amoxicillin-clavulanate (AUGMENTIN) 875-125 MG tablet; Take 1 tablet by mouth 2 (two) times daily.   I am having Ms. Crigger start on amoxicillin-clavulanate. I am also having her maintain her loratadine, Magnesium Oxide, ALPRAZolam, propranolol, and tretinoin.  Meds ordered this encounter  Medications  . tretinoin (RETIN-A) 0.025 % cream  Sig: APPLY PEA SIZED AMOUNT TO FACE AT BEDTIME AS DIRECTED    Refill:  2  . amoxicillin-clavulanate (AUGMENTIN) 875-125 MG tablet    Sig: Take 1 tablet by mouth 2 (two) times daily.    Dispense:  20 tablet    Refill:  0     Follow-up: Return in about 2 weeks (around 04/29/2015).  Scarlette Calico, MD

## 2015-04-15 NOTE — Progress Notes (Signed)
Pre visit review using our clinic review tool, if applicable. No additional management support is needed unless otherwise documented below in the visit note. 

## 2015-04-15 NOTE — Patient Instructions (Signed)

## 2015-07-18 ENCOUNTER — Other Ambulatory Visit: Payer: Self-pay | Admitting: Internal Medicine

## 2015-07-18 NOTE — Telephone Encounter (Signed)
Faxed to pharmacy

## 2015-09-01 ENCOUNTER — Encounter: Payer: BLUE CROSS/BLUE SHIELD | Admitting: Internal Medicine

## 2015-09-01 ENCOUNTER — Encounter: Payer: Self-pay | Admitting: Internal Medicine

## 2015-09-01 ENCOUNTER — Other Ambulatory Visit (INDEPENDENT_AMBULATORY_CARE_PROVIDER_SITE_OTHER): Payer: BLUE CROSS/BLUE SHIELD

## 2015-09-01 ENCOUNTER — Ambulatory Visit (INDEPENDENT_AMBULATORY_CARE_PROVIDER_SITE_OTHER): Payer: BLUE CROSS/BLUE SHIELD | Admitting: Internal Medicine

## 2015-09-01 VITALS — BP 118/70 | HR 109 | Temp 98.0°F | Ht 64.0 in | Wt 144.5 lb

## 2015-09-01 DIAGNOSIS — Z0000 Encounter for general adult medical examination without abnormal findings: Secondary | ICD-10-CM

## 2015-09-01 DIAGNOSIS — R10814 Left lower quadrant abdominal tenderness: Secondary | ICD-10-CM | POA: Diagnosis not present

## 2015-09-01 DIAGNOSIS — E785 Hyperlipidemia, unspecified: Secondary | ICD-10-CM | POA: Insufficient documentation

## 2015-09-01 DIAGNOSIS — F411 Generalized anxiety disorder: Secondary | ICD-10-CM

## 2015-09-01 DIAGNOSIS — E1169 Type 2 diabetes mellitus with other specified complication: Secondary | ICD-10-CM | POA: Insufficient documentation

## 2015-09-01 LAB — COMPREHENSIVE METABOLIC PANEL
ALBUMIN: 4.5 g/dL (ref 3.5–5.2)
ALT: 14 U/L (ref 0–35)
AST: 18 U/L (ref 0–37)
Alkaline Phosphatase: 49 U/L (ref 39–117)
BUN: 16 mg/dL (ref 6–23)
CALCIUM: 9.7 mg/dL (ref 8.4–10.5)
CHLORIDE: 100 meq/L (ref 96–112)
CO2: 29 meq/L (ref 19–32)
CREATININE: 0.84 mg/dL (ref 0.40–1.20)
GFR: 72.82 mL/min (ref >60.00)
Glucose, Bld: 112 mg/dL — ABNORMAL HIGH (ref 70–99)
POTASSIUM: 4.6 meq/L (ref 3.5–5.1)
Sodium: 137 mEq/L (ref 135–145)
Total Bilirubin: 0.6 mg/dL (ref 0.2–1.2)
Total Protein: 7.5 g/dL (ref 6.0–8.3)

## 2015-09-01 LAB — LIPID PANEL
CHOL/HDL RATIO: 3
CHOLESTEROL: 286 mg/dL — AB (ref 0–200)
HDL: 84 mg/dL (ref >39.00)
LDL CALC: 188 mg/dL — AB (ref 0–99)
NonHDL: 201.72
TRIGLYCERIDES: 67 mg/dL (ref 0.0–149.0)
VLDL: 13.4 mg/dL (ref 0.0–40.0)

## 2015-09-01 LAB — HEMOGLOBIN A1C: HEMOGLOBIN A1C: 6.3 % (ref 4.6–6.5)

## 2015-09-01 NOTE — Assessment & Plan Note (Signed)
Sounds to be some IBS from recent stress, talked to her about adding fiber to her diet and working on exercise to decrease stress.

## 2015-09-01 NOTE — Assessment & Plan Note (Signed)
BP normal, screening up to date with colon and mammogram and pap smear. Declines flu shot today and declines shingles as she is stressed right now. Checking labs today. Adjust as needed. Counseled on health and prevention.

## 2015-09-01 NOTE — Assessment & Plan Note (Signed)
Using xanax prn and not daily.

## 2015-09-01 NOTE — Progress Notes (Signed)
   Subjective:    Patient ID: Jean Smith, female    DOB: [birth year], 65 y.o.   MRN: [id number]  HPI The patient is a 64 YO female coming in for wellness. Lots of extra stress in her life right now with her autistic son (struggling with alcoholism right now and recent relapse). Not exercising and having some irritable bowel right now.   PMH, [facility], social history reviewed and updated.   Review of Systems  Constitutional: Negative.   HENT: Negative.   Eyes: Negative.   Respiratory: Negative for cough, chest tightness, shortness of breath and wheezing.   Cardiovascular: Negative for chest pain, palpitations and leg swelling.  Gastrointestinal: Positive for diarrhea. Negative for nausea, vomiting, abdominal pain, blood in stool, abdominal distention and anal bleeding.  Genitourinary: Negative.   Musculoskeletal: Negative.   Skin: Negative.   Neurological: Negative.   Psychiatric/Behavioral: Positive for sleep disturbance and decreased concentration. Negative for suicidal ideas and self-injury. The patient is nervous/anxious. The patient is not hyperactive.       Objective:   Physical Exam  Constitutional: She is oriented to person, place, and time. She appears well-developed and well-nourished.  Frazzled appearing  HENT:  Head: Normocephalic and atraumatic.  Eyes: EOM are normal.  Neck: Normal range of motion.  Cardiovascular: Regular rhythm.   Slightly fast rate  Pulmonary/Chest: Effort normal and breath sounds normal. No respiratory distress. She has no wheezes. She has no rales.  Abdominal: Soft. Bowel sounds are normal. She exhibits no distension. There is no tenderness. There is no rebound.  Musculoskeletal: Normal range of motion.  Neurological: She is alert and oriented to person, place, and time.  Skin: Skin is warm and dry.  Psychiatric:  Distracted during our visit and talking more about her son than herself   Filed Vitals:   09/01/15 1016  BP: 118/70  Pulse: 109    Temp: 98 F (36.7 C)  TempSrc: Oral  Height: 5\' 4"  (1.626 m)  Weight: 144 lb 8 oz (65.545 kg)  SpO2: 97%      Assessment & Plan:

## 2015-09-01 NOTE — Patient Instructions (Signed)
Start adding more fiber into your diet to help with the stomach. The other thing that should help is to start being more active and exercising 3 times per week if you can.   This helps to burn off stress and regulate your gut.   Health Maintenance, Female Adopting a healthy lifestyle and getting preventive care can go a long way to promote health and wellness. Talk with your health care provider about what schedule of regular examinations is right for you. This is a good chance for you to check in with your provider about disease prevention and staying healthy. In between checkups, there are plenty of things you can do on your own. Experts have done a lot of research about which lifestyle changes and preventive measures are most likely to keep you healthy. Ask your health care provider for more information. WEIGHT AND DIET  Eat a healthy diet  Be sure to include plenty of vegetables, fruits, low-fat dairy products, and lean protein.  Do not eat a lot of foods high in solid fats, added sugars, or salt.  Get regular exercise. This is one of the most important things you can do for your health.  Most adults should exercise for at least 150 minutes each week. The exercise should increase your heart rate and make you sweat (moderate-intensity exercise).  Most adults should also do strengthening exercises at least twice a week. This is in addition to the moderate-intensity exercise.  Maintain a healthy weight  Body mass index (BMI) is a measurement that can be used to identify possible weight problems. It estimates body fat based on height and weight. Your health care provider can help determine your BMI and help you achieve or maintain a healthy weight.  For females 30 years of age and older:   A BMI below 18.5 is considered underweight.  A BMI of 18.5 to 24.9 is normal.  A BMI of 25 to 29.9 is considered overweight.  A BMI of 30 and above is considered obese.  Watch levels of  cholesterol and blood lipids  You should start having your blood tested for lipids and cholesterol at 63 years of age, then have this test every 5 years.  You may need to have your cholesterol levels checked more often if:  Your lipid or cholesterol levels are high.  You are older than 63 years of age.  You are at high risk for heart disease.  CANCER SCREENING   Lung Cancer  Lung cancer screening is recommended for adults 29-25 years old who are at high risk for lung cancer because of a history of smoking.  A yearly low-dose CT scan of the lungs is recommended for people who:  Currently smoke.  Have quit within the past 15 years.  Have at least a 30-pack-year history of smoking. A pack year is smoking an average of one pack of cigarettes a day for 1 year.  Yearly screening should continue until it has been 15 years since you quit.  Yearly screening should stop if you develop a health problem that would prevent you from having lung cancer treatment.  Breast Cancer  Practice breast self-awareness. This means understanding how your breasts normally appear and feel.  It also means doing regular breast self-exams. Let your health care provider know about any changes, no matter how small.  If you are in your 20s or 30s, you should have a clinical breast exam (CBE) by a health care provider every 1-3 years as part of  a regular health exam.  If you are 4 or older, have a CBE every year. Also consider having a breast X-ray (mammogram) every year.  If you have a family history of breast cancer, talk to your health care provider about genetic screening.  If you are at high risk for breast cancer, talk to your health care provider about having an MRI and a mammogram every year.  Breast cancer gene (BRCA) assessment is recommended for women who have family members with BRCA-related cancers. BRCA-related cancers include:  Breast.  Ovarian.  Tubal.  Peritoneal  cancers.  Results of the assessment will determine the need for genetic counseling and BRCA1 and BRCA2 testing. Cervical Cancer Your health care provider may recommend that you be screened regularly for cancer of the pelvic organs (ovaries, uterus, and vagina). This screening involves a pelvic examination, including checking for microscopic changes to the surface of your cervix (Pap test). You may be encouraged to have this screening done every 3 years, beginning at age 13.  For women ages 51-65, health care providers may recommend pelvic exams and Pap testing every 3 years, or they may recommend the Pap and pelvic exam, combined with testing for human papilloma virus (HPV), every 5 years. Some types of HPV increase your risk of cervical cancer. Testing for HPV may also be done on women of any age with unclear Pap test results.  Other health care providers may not recommend any screening for nonpregnant women who are considered low risk for pelvic cancer and who do not have symptoms. Ask your health care provider if a screening pelvic exam is right for you.  If you have had past treatment for cervical cancer or a condition that could lead to cancer, you need Pap tests and screening for cancer for at least 20 years after your treatment. If Pap tests have been discontinued, your risk factors (such as having a new sexual partner) need to be reassessed to determine if screening should resume. Some women have medical problems that increase the chance of getting cervical cancer. In these cases, your health care provider may recommend more frequent screening and Pap tests. Colorectal Cancer  This type of cancer can be detected and often prevented.  Routine colorectal cancer screening usually begins at 63 years of age and continues through 63 years of age.  Your health care provider may recommend screening at an earlier age if you have risk factors for colon cancer.  Your health care provider may also  recommend using home test kits to check for hidden blood in the stool.  A small camera at the end of a tube can be used to examine your colon directly (sigmoidoscopy or colonoscopy). This is done to check for the earliest forms of colorectal cancer.  Routine screening usually begins at age 61.  Direct examination of the colon should be repeated every 5-10 years through 63 years of age. However, you may need to be screened more often if early forms of precancerous polyps or small growths are found. Skin Cancer  Check your skin from head to toe regularly.  Tell your health care provider about any new moles or changes in moles, especially if there is a change in a mole's shape or color.  Also tell your health care provider if you have a mole that is larger than the size of a pencil eraser.  Always use sunscreen. Apply sunscreen liberally and repeatedly throughout the day.  Protect yourself by wearing long sleeves, pants, a wide-brimmed  hat, and sunglasses whenever you are outside. HEART DISEASE, DIABETES, AND HIGH BLOOD PRESSURE   High blood pressure causes heart disease and increases the risk of stroke. High blood pressure is more likely to develop in:  People who have blood pressure in the high end of the normal range (130-139/85-89 mm Hg).  People who are overweight or obese.  People who are African American.  If you are 42-48 years of age, have your blood pressure checked every 3-5 years. If you are 103 years of age or older, have your blood pressure checked every year. You should have your blood pressure measured twice--once when you are at a hospital or clinic, and once when you are not at a hospital or clinic. Record the average of the two measurements. To check your blood pressure when you are not at a hospital or clinic, you can use:  An automated blood pressure machine at a pharmacy.  A home blood pressure monitor.  If you are between 39 years and 54 years old, ask your health  care provider if you should take aspirin to prevent strokes.  Have regular diabetes screenings. This involves taking a blood sample to check your fasting blood sugar level.  If you are at a normal weight and have a low risk for diabetes, have this test once every three years after 63 years of age.  If you are overweight and have a high risk for diabetes, consider being tested at a younger age or more often. PREVENTING INFECTION  Hepatitis B  If you have a higher risk for hepatitis B, you should be screened for this virus. You are considered at high risk for hepatitis B if:  You were born in a country where hepatitis B is common. Ask your health care provider which countries are considered high risk.  Your parents were born in a high-risk country, and you have not been immunized against hepatitis B (hepatitis B vaccine).  You have HIV or AIDS.  You use needles to inject street drugs.  You live with someone who has hepatitis B.  You have had sex with someone who has hepatitis B.  You get hemodialysis treatment.  You take certain medicines for conditions, including cancer, organ transplantation, and autoimmune conditions. Hepatitis C  Blood testing is recommended for:  Everyone born from [birth year] through 1963.  Anyone with known risk factors for hepatitis C. Sexually transmitted infections (STIs)  You should be screened for sexually transmitted infections (STIs) including gonorrhea and chlamydia if:  You are sexually active and are younger than 63 years of age.  You are older than 64 years of age and your health care provider tells you that you are at risk for this type of infection.  Your sexual activity has changed since you were last screened and you are at an increased risk for chlamydia or gonorrhea. Ask your health care provider if you are at risk.  If you do not have HIV, but are at risk, it may be recommended that you take a prescription medicine daily to prevent HIV  infection. This is called pre-exposure prophylaxis (PrEP). You are considered at risk if:  You are sexually active and do not regularly use condoms or know the HIV status of your partner(s).  You take drugs by injection.  You are sexually active with a partner who has HIV. Talk with your health care provider about whether you are at high risk of being infected with HIV. If you choose to begin PrEP, you  should first be tested for HIV. You should then be tested every 3 months for as long as you are taking PrEP.  PREGNANCY   If you are premenopausal and you may become pregnant, ask your health care provider about preconception counseling.  If you may become pregnant, take 400 to 800 micrograms (mcg) of folic acid every day.  If you want to prevent pregnancy, talk to your health care provider about birth control (contraception). OSTEOPOROSIS AND MENOPAUSE   Osteoporosis is a disease in which the bones lose minerals and strength with aging. This can result in serious bone fractures. Your risk for osteoporosis can be identified using a bone density scan.  If you are 51 years of age or older, or if you are at risk for osteoporosis and fractures, ask your health care provider if you should be screened.  Ask your health care provider whether you should take a calcium or vitamin D supplement to lower your risk for osteoporosis.  Menopause may have certain physical symptoms and risks.  Hormone replacement therapy may reduce some of these symptoms and risks. Talk to your health care provider about whether hormone replacement therapy is right for you.  HOME CARE INSTRUCTIONS   Schedule regular health, dental, and eye exams.  Stay current with your immunizations.   Do not use any tobacco products including cigarettes, chewing tobacco, or electronic cigarettes.  If you are pregnant, do not drink alcohol.  If you are breastfeeding, limit how much and how often you drink alcohol.  Limit  alcohol intake to no more than 1 drink per day for nonpregnant women. One drink equals 12 ounces of beer, 5 ounces of wine, or 1 ounces of hard liquor.  Do not use street drugs.  Do not share needles.  Ask your health care provider for help if you need support or information about quitting drugs.  Tell your health care provider if you often feel depressed.  Tell your health care provider if you have ever been abused or do not feel safe at home.   This information is not intended to replace advice given to you by your health care provider. Make sure you discuss any questions you have with your health care provider.   Document Released: 12/14/2010 Document Revised: 06/21/2014 Document Reviewed: 05/02/2013 Elsevier Interactive Patient Education [employer].

## 2015-09-01 NOTE — Assessment & Plan Note (Signed)
No diet changes or exercise since last visit. Previous LDL high and not on medication right now. Discussed with her and she would like to recheck and work on exercise if needed.

## 2015-09-18 DIAGNOSIS — F411 Generalized anxiety disorder: Secondary | ICD-10-CM | POA: Diagnosis not present

## 2015-09-30 DIAGNOSIS — F411 Generalized anxiety disorder: Secondary | ICD-10-CM | POA: Diagnosis not present

## 2015-10-01 DIAGNOSIS — F411 Generalized anxiety disorder: Secondary | ICD-10-CM | POA: Diagnosis not present

## 2015-10-08 DIAGNOSIS — F411 Generalized anxiety disorder: Secondary | ICD-10-CM | POA: Diagnosis not present

## 2015-10-14 DIAGNOSIS — F411 Generalized anxiety disorder: Secondary | ICD-10-CM | POA: Diagnosis not present

## 2015-10-17 DIAGNOSIS — F411 Generalized anxiety disorder: Secondary | ICD-10-CM | POA: Diagnosis not present

## 2015-10-20 DIAGNOSIS — F411 Generalized anxiety disorder: Secondary | ICD-10-CM | POA: Diagnosis not present

## 2015-10-24 DIAGNOSIS — F411 Generalized anxiety disorder: Secondary | ICD-10-CM | POA: Diagnosis not present

## 2015-10-28 DIAGNOSIS — F411 Generalized anxiety disorder: Secondary | ICD-10-CM | POA: Diagnosis not present

## 2015-11-13 DIAGNOSIS — F411 Generalized anxiety disorder: Secondary | ICD-10-CM | POA: Diagnosis not present

## 2015-11-17 DIAGNOSIS — F411 Generalized anxiety disorder: Secondary | ICD-10-CM | POA: Diagnosis not present

## 2015-11-25 ENCOUNTER — Other Ambulatory Visit: Payer: Self-pay | Admitting: Internal Medicine

## 2015-11-25 NOTE — Telephone Encounter (Signed)
Faxed to pharmacy

## 2015-12-09 DIAGNOSIS — F411 Generalized anxiety disorder: Secondary | ICD-10-CM | POA: Diagnosis not present

## 2015-12-12 DIAGNOSIS — F411 Generalized anxiety disorder: Secondary | ICD-10-CM | POA: Diagnosis not present

## 2015-12-19 DIAGNOSIS — F411 Generalized anxiety disorder: Secondary | ICD-10-CM | POA: Diagnosis not present

## 2015-12-22 DIAGNOSIS — F411 Generalized anxiety disorder: Secondary | ICD-10-CM | POA: Diagnosis not present

## 2015-12-30 DIAGNOSIS — F411 Generalized anxiety disorder: Secondary | ICD-10-CM | POA: Diagnosis not present

## 2016-01-02 DIAGNOSIS — F411 Generalized anxiety disorder: Secondary | ICD-10-CM | POA: Diagnosis not present

## 2016-01-09 DIAGNOSIS — F411 Generalized anxiety disorder: Secondary | ICD-10-CM | POA: Diagnosis not present

## 2016-01-23 DIAGNOSIS — F411 Generalized anxiety disorder: Secondary | ICD-10-CM | POA: Diagnosis not present

## 2016-01-29 DIAGNOSIS — F411 Generalized anxiety disorder: Secondary | ICD-10-CM | POA: Diagnosis not present

## 2016-02-05 DIAGNOSIS — F411 Generalized anxiety disorder: Secondary | ICD-10-CM | POA: Diagnosis not present

## 2016-02-20 DIAGNOSIS — F411 Generalized anxiety disorder: Secondary | ICD-10-CM | POA: Diagnosis not present

## 2016-02-23 DIAGNOSIS — F411 Generalized anxiety disorder: Secondary | ICD-10-CM | POA: Diagnosis not present

## 2016-03-01 DIAGNOSIS — F411 Generalized anxiety disorder: Secondary | ICD-10-CM | POA: Diagnosis not present

## 2016-03-02 DIAGNOSIS — F411 Generalized anxiety disorder: Secondary | ICD-10-CM | POA: Diagnosis not present

## 2016-03-04 ENCOUNTER — Ambulatory Visit (INDEPENDENT_AMBULATORY_CARE_PROVIDER_SITE_OTHER)
Admission: RE | Admit: 2016-03-04 | Discharge: 2016-03-04 | Disposition: A | Discharge status: Home / Self Care | Payer: BLUE CROSS/BLUE SHIELD | Source: Ambulatory Visit | Attending: Internal Medicine | Admitting: Internal Medicine

## 2016-03-04 ENCOUNTER — Ambulatory Visit (INDEPENDENT_AMBULATORY_CARE_PROVIDER_SITE_OTHER): Payer: BLUE CROSS/BLUE SHIELD | Admitting: Internal Medicine

## 2016-03-04 VITALS — BP 142/80 | HR 111 | Temp 98.3°F | Resp 16 | Wt 145.0 lb

## 2016-03-04 DIAGNOSIS — R05 Cough: Secondary | ICD-10-CM | POA: Diagnosis not present

## 2016-03-04 DIAGNOSIS — R0989 Other specified symptoms and signs involving the circulatory and respiratory systems: Secondary | ICD-10-CM | POA: Diagnosis not present

## 2016-03-04 DIAGNOSIS — R059 Cough, unspecified: Secondary | ICD-10-CM

## 2016-03-04 NOTE — Progress Notes (Signed)
   Subjective:    Patient ID: Jean Smith, female    DOB: 09-30-1950, 65 y.o.   MRN: [id number]  HPI The patient is a 65 YO female coming in for cough for about 5-6 weeks. She is having more stress in her life right now. Denies nasal congestion or drainage. No SOB or limitation to activity although the stress has caused her to stop some activities. She denies overt GERD. Still not smoking (since 1985).   Review of Systems  Constitutional: Negative.   HENT: Negative.   Eyes: Negative.   Respiratory: Positive for cough. Negative for chest tightness, shortness of breath and wheezing.   Cardiovascular: Negative for chest pain, palpitations and leg swelling.  Gastrointestinal: Negative for abdominal distention, abdominal pain, anal bleeding, blood in stool, diarrhea, nausea and vomiting.  Genitourinary: Negative.   Musculoskeletal: Negative.   Skin: Negative.   Neurological: Negative.       Objective:   Physical Exam  Constitutional: She is oriented to person, place, and time. She appears well-developed and well-nourished.  HENT:  Head: Normocephalic and atraumatic.  Eyes: EOM are normal.  Neck: Normal range of motion.  Cardiovascular: Regular rhythm.   Slightly fast rate  Pulmonary/Chest: Effort normal and breath sounds normal. No respiratory distress. She has no wheezes. She has no rales. She exhibits no tenderness.  Abdominal: Soft. Bowel sounds are normal. She exhibits no distension. There is no tenderness. There is no rebound.  Neurological: She is alert and oriented to person, place, and time.  Skin: Skin is warm and dry.   Vitals:   03/04/16 1618  BP: (!) 142/80  Pulse: (!) 111  Resp: 16  Temp: 98.3 F (36.8 C)  TempSrc: Oral  SpO2: 97%  Weight: 145 lb (65.8 kg)      Assessment & Plan:

## 2016-03-04 NOTE — Patient Instructions (Signed)
We are checking the chest x-ray today and will call you back with the results.   If we do not find anything it could be coming from heart burn. We would like you to try taking prilosec over the counter for 2-3 weeks to see if this helps the cough.

## 2016-03-04 NOTE — Progress Notes (Signed)
Pre visit review using our clinic review tool, if applicable. No additional management support is needed unless otherwise documented below in the visit note. 

## 2016-03-05 ENCOUNTER — Telehealth: Payer: Self-pay | Admitting: Internal Medicine

## 2016-03-05 DIAGNOSIS — L570 Actinic keratosis: Secondary | ICD-10-CM | POA: Diagnosis not present

## 2016-03-05 DIAGNOSIS — L738 Other specified follicular disorders: Secondary | ICD-10-CM | POA: Diagnosis not present

## 2016-03-05 DIAGNOSIS — R059 Cough, unspecified: Secondary | ICD-10-CM | POA: Insufficient documentation

## 2016-03-05 DIAGNOSIS — R05 Cough: Secondary | ICD-10-CM | POA: Insufficient documentation

## 2016-03-05 DIAGNOSIS — L720 Epidermal cyst: Secondary | ICD-10-CM | POA: Diagnosis not present

## 2016-03-05 DIAGNOSIS — D2372 Other benign neoplasm of skin of left lower limb, including hip: Secondary | ICD-10-CM | POA: Diagnosis not present

## 2016-03-05 DIAGNOSIS — L821 Other seborrheic keratosis: Secondary | ICD-10-CM | POA: Diagnosis not present

## 2016-03-05 NOTE — Assessment & Plan Note (Signed)
Checking CXR, treat as appropriate. If no findings will treat for GERD as possible cause.

## 2016-03-05 NOTE — Telephone Encounter (Signed)
Pt has some questions about the response that was sent to her on my chart about the results of the xray.  She stated that she smoked 35 years ago and for a very short time   Best number [phone]

## 2016-03-05 NOTE — Telephone Encounter (Signed)
Called and spoke to patient and cleared up her concerns about her cxr.

## 2016-03-16 DIAGNOSIS — F411 Generalized anxiety disorder: Secondary | ICD-10-CM | POA: Diagnosis not present

## 2016-04-15 DIAGNOSIS — F4323 Adjustment disorder with mixed anxiety and depressed mood: Secondary | ICD-10-CM | POA: Diagnosis not present

## 2016-04-27 DIAGNOSIS — F4323 Adjustment disorder with mixed anxiety and depressed mood: Secondary | ICD-10-CM | POA: Diagnosis not present

## 2016-04-29 DIAGNOSIS — F4323 Adjustment disorder with mixed anxiety and depressed mood: Secondary | ICD-10-CM | POA: Diagnosis not present

## 2016-05-03 DIAGNOSIS — F4323 Adjustment disorder with mixed anxiety and depressed mood: Secondary | ICD-10-CM | POA: Diagnosis not present

## 2016-05-13 ENCOUNTER — Encounter: Payer: Self-pay | Admitting: Gastroenterology

## 2016-05-13 DIAGNOSIS — F4323 Adjustment disorder with mixed anxiety and depressed mood: Secondary | ICD-10-CM | POA: Diagnosis not present

## 2016-05-17 DIAGNOSIS — F4323 Adjustment disorder with mixed anxiety and depressed mood: Secondary | ICD-10-CM | POA: Diagnosis not present

## 2016-05-24 DIAGNOSIS — F4323 Adjustment disorder with mixed anxiety and depressed mood: Secondary | ICD-10-CM | POA: Diagnosis not present

## 2016-05-27 DIAGNOSIS — F4323 Adjustment disorder with mixed anxiety and depressed mood: Secondary | ICD-10-CM | POA: Diagnosis not present

## 2016-05-31 DIAGNOSIS — F4323 Adjustment disorder with mixed anxiety and depressed mood: Secondary | ICD-10-CM | POA: Diagnosis not present

## 2016-06-03 DIAGNOSIS — F4323 Adjustment disorder with mixed anxiety and depressed mood: Secondary | ICD-10-CM | POA: Diagnosis not present

## 2016-06-11 DIAGNOSIS — F4323 Adjustment disorder with mixed anxiety and depressed mood: Secondary | ICD-10-CM | POA: Diagnosis not present

## 2016-06-15 DIAGNOSIS — F4323 Adjustment disorder with mixed anxiety and depressed mood: Secondary | ICD-10-CM | POA: Diagnosis not present

## 2016-06-22 DIAGNOSIS — F4323 Adjustment disorder with mixed anxiety and depressed mood: Secondary | ICD-10-CM | POA: Diagnosis not present

## 2016-07-02 DIAGNOSIS — F4323 Adjustment disorder with mixed anxiety and depressed mood: Secondary | ICD-10-CM | POA: Diagnosis not present

## 2016-07-08 DIAGNOSIS — F4323 Adjustment disorder with mixed anxiety and depressed mood: Secondary | ICD-10-CM | POA: Diagnosis not present

## 2016-07-19 DIAGNOSIS — F4323 Adjustment disorder with mixed anxiety and depressed mood: Secondary | ICD-10-CM | POA: Diagnosis not present

## 2016-07-26 DIAGNOSIS — F4323 Adjustment disorder with mixed anxiety and depressed mood: Secondary | ICD-10-CM | POA: Diagnosis not present

## 2016-08-02 DIAGNOSIS — F4323 Adjustment disorder with mixed anxiety and depressed mood: Secondary | ICD-10-CM | POA: Diagnosis not present

## 2016-08-26 DIAGNOSIS — F4323 Adjustment disorder with mixed anxiety and depressed mood: Secondary | ICD-10-CM | POA: Diagnosis not present

## 2016-09-02 DIAGNOSIS — F4323 Adjustment disorder with mixed anxiety and depressed mood: Secondary | ICD-10-CM | POA: Diagnosis not present

## 2016-09-08 ENCOUNTER — Other Ambulatory Visit: Payer: Self-pay | Admitting: Internal Medicine

## 2016-09-09 DIAGNOSIS — F4323 Adjustment disorder with mixed anxiety and depressed mood: Secondary | ICD-10-CM | POA: Diagnosis not present

## 2016-09-21 DIAGNOSIS — F4323 Adjustment disorder with mixed anxiety and depressed mood: Secondary | ICD-10-CM | POA: Diagnosis not present

## 2016-09-23 DIAGNOSIS — F4323 Adjustment disorder with mixed anxiety and depressed mood: Secondary | ICD-10-CM | POA: Diagnosis not present

## 2016-09-24 IMAGING — DX DG CHEST 2V
2 series · 2 of 2 positions shown · non-contrast
Comparison: 12/01/2012.

CLINICAL DATA: Chest congestion.

EXAM:
CHEST  2 VIEW

[chest pa]
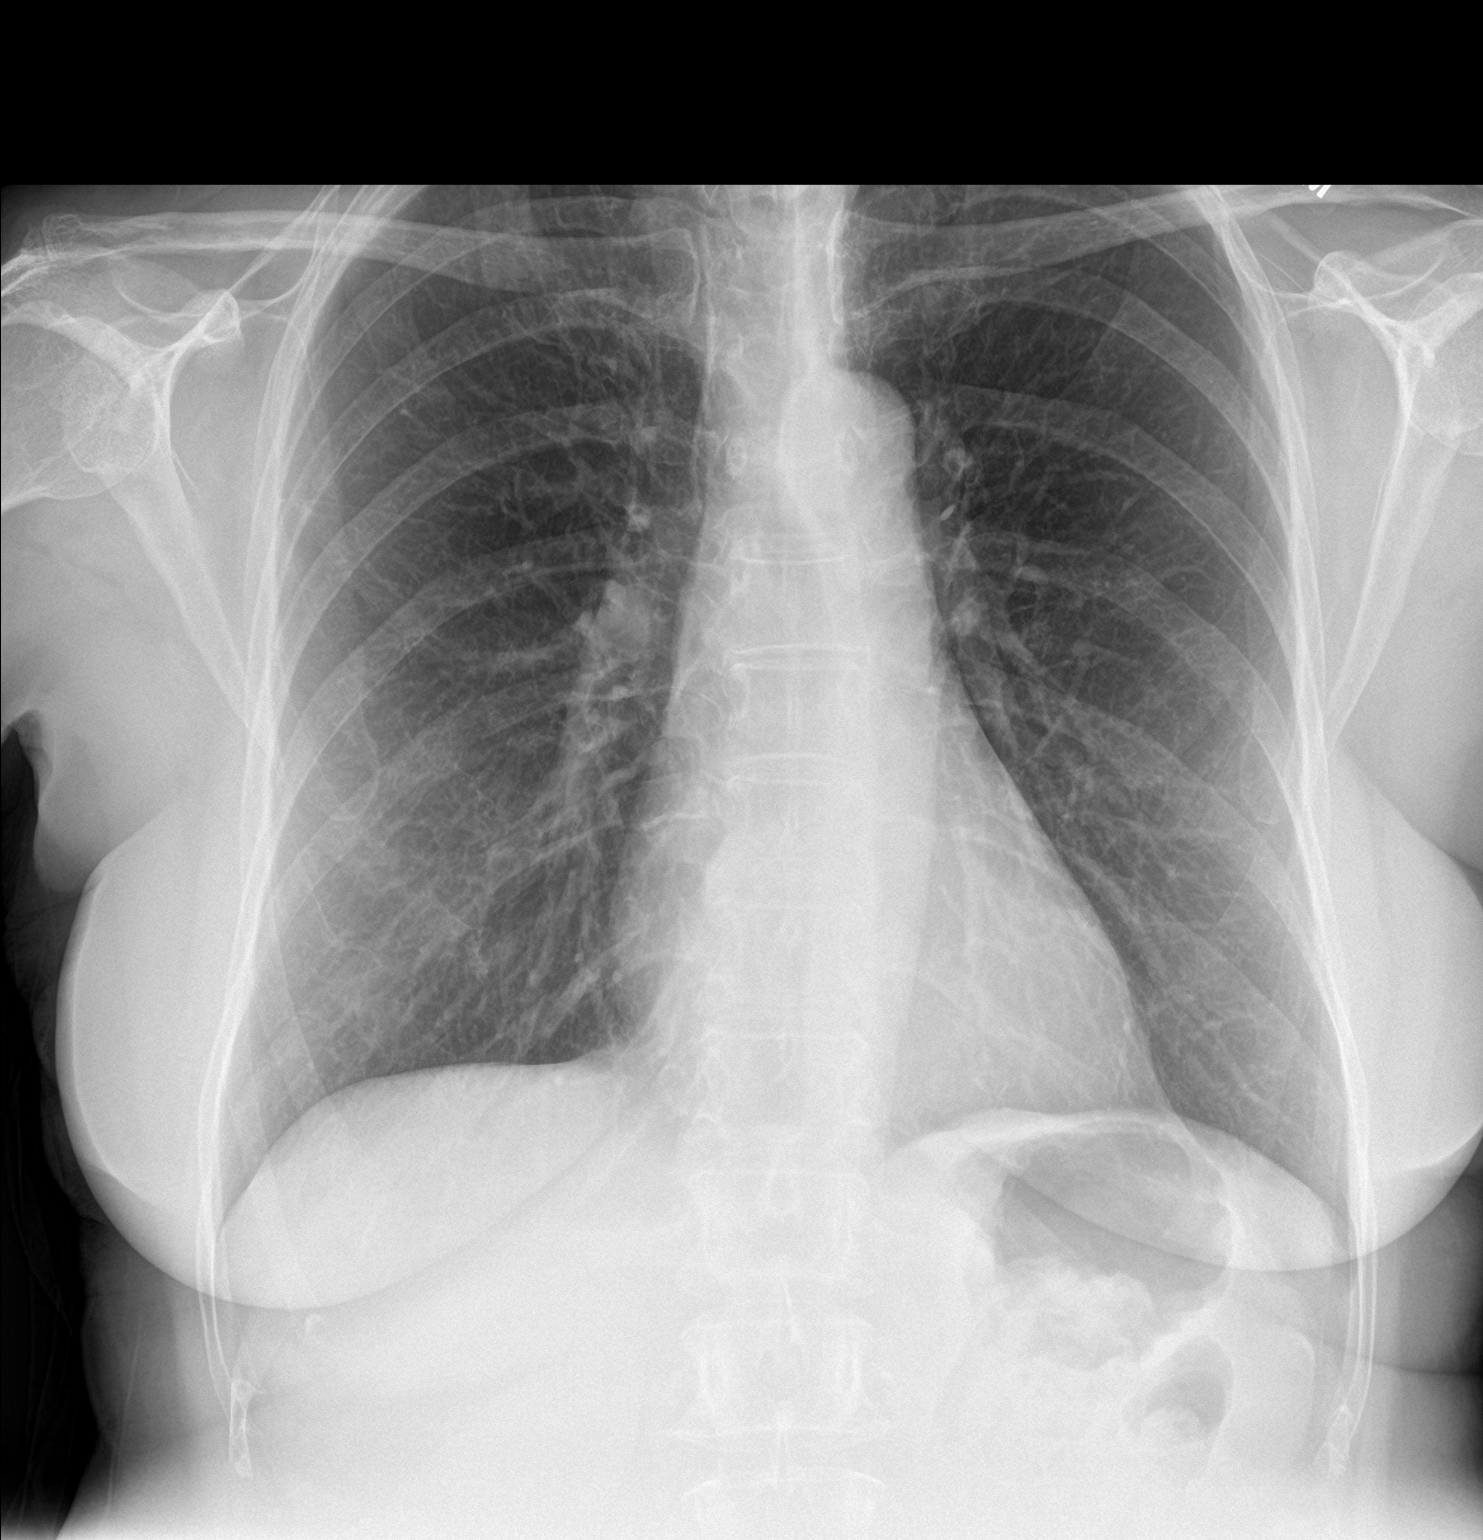

[chest lat]
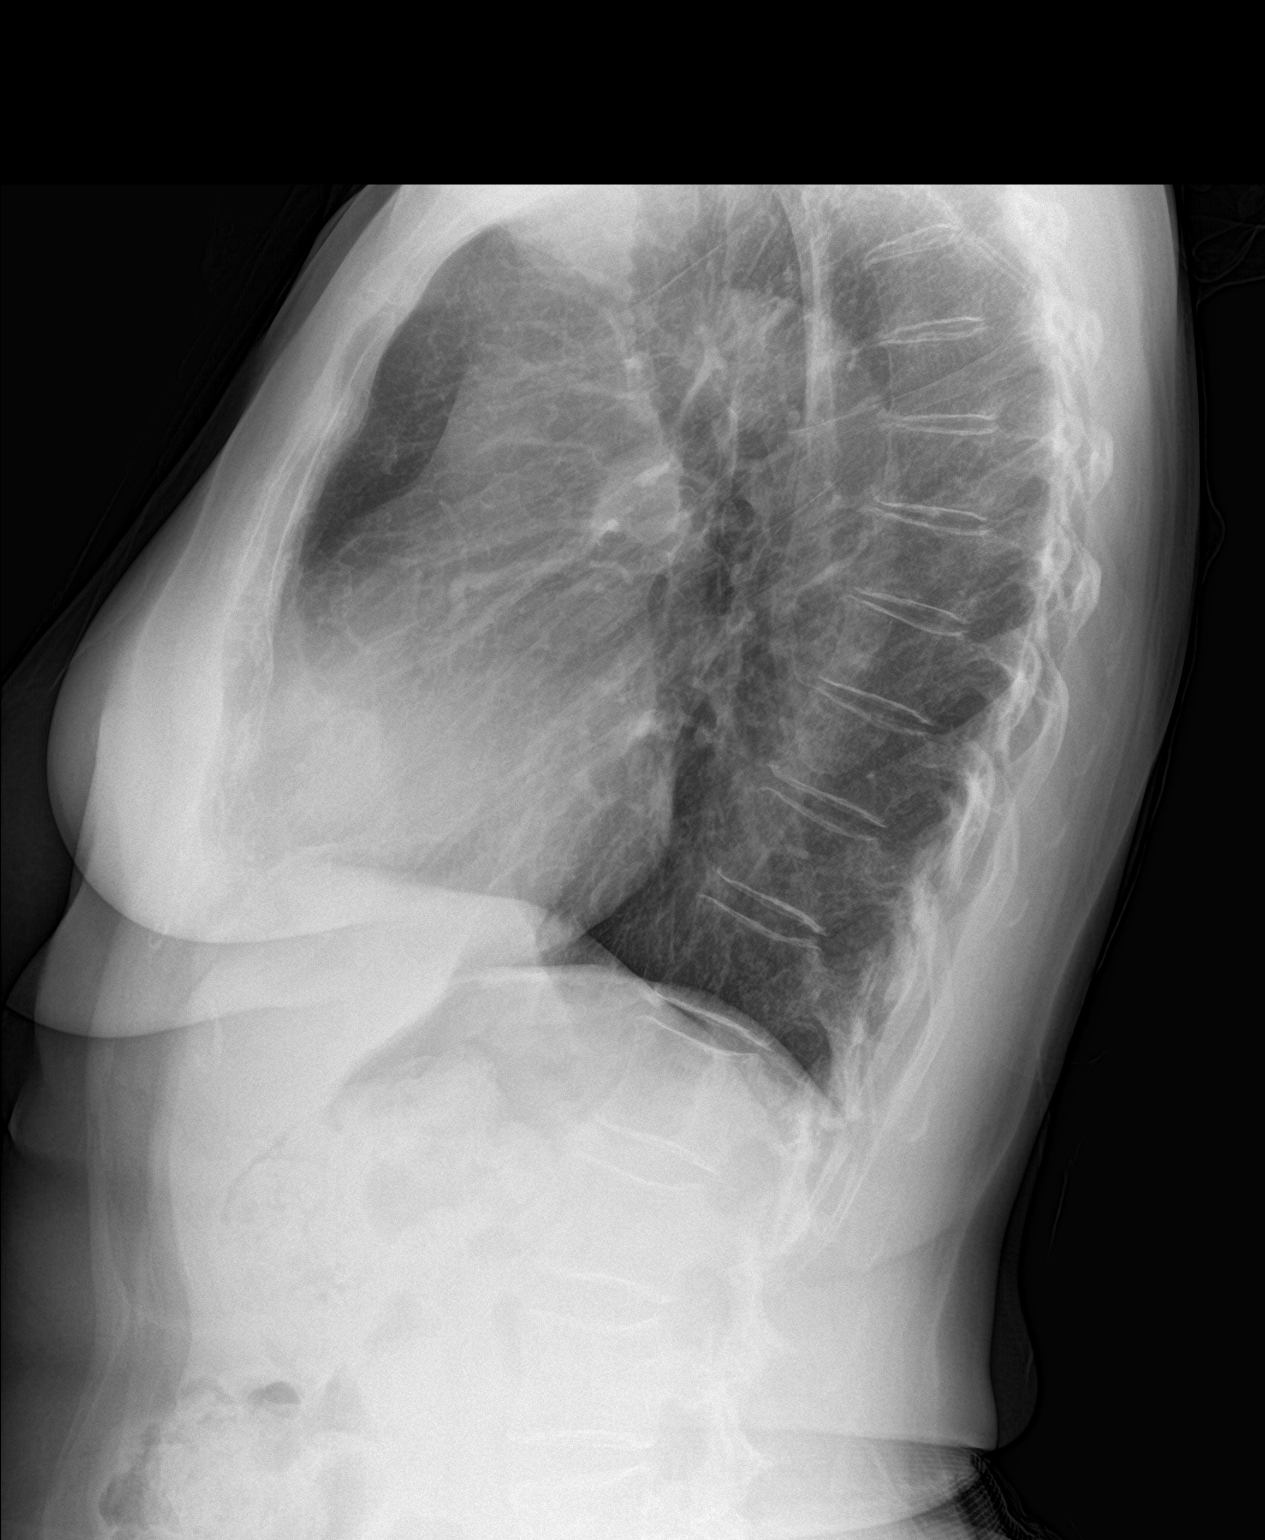

[2 of 2 positions shown; findings below may reference images not displayed]

FINDINGS: Mediastinum and hilar structures normal. Cardiomegaly with normal
pulmonary vascularity. Biapical pleural-parenchymal thickening noted
consistent with scarring.Lungs are otherwise clear. No pleural
effusion or pneumothorax.
IMPRESSION: Cardiomegaly. No pulmonary venous congestion. No focal pulmonary
infiltrate .

## 2016-09-27 DIAGNOSIS — F4323 Adjustment disorder with mixed anxiety and depressed mood: Secondary | ICD-10-CM | POA: Diagnosis not present

## 2016-09-30 DIAGNOSIS — F4323 Adjustment disorder with mixed anxiety and depressed mood: Secondary | ICD-10-CM | POA: Diagnosis not present

## 2016-10-04 DIAGNOSIS — F4323 Adjustment disorder with mixed anxiety and depressed mood: Secondary | ICD-10-CM | POA: Diagnosis not present

## 2016-10-07 DIAGNOSIS — F4323 Adjustment disorder with mixed anxiety and depressed mood: Secondary | ICD-10-CM | POA: Diagnosis not present

## 2016-10-11 DIAGNOSIS — F4323 Adjustment disorder with mixed anxiety and depressed mood: Secondary | ICD-10-CM | POA: Diagnosis not present

## 2016-10-14 DIAGNOSIS — F4323 Adjustment disorder with mixed anxiety and depressed mood: Secondary | ICD-10-CM | POA: Diagnosis not present

## 2016-10-18 DIAGNOSIS — F4323 Adjustment disorder with mixed anxiety and depressed mood: Secondary | ICD-10-CM | POA: Diagnosis not present

## 2016-10-21 DIAGNOSIS — F4323 Adjustment disorder with mixed anxiety and depressed mood: Secondary | ICD-10-CM | POA: Diagnosis not present

## 2016-10-28 DIAGNOSIS — F4323 Adjustment disorder with mixed anxiety and depressed mood: Secondary | ICD-10-CM | POA: Diagnosis not present

## 2016-11-18 DIAGNOSIS — F4323 Adjustment disorder with mixed anxiety and depressed mood: Secondary | ICD-10-CM | POA: Diagnosis not present

## 2016-11-23 DIAGNOSIS — F4323 Adjustment disorder with mixed anxiety and depressed mood: Secondary | ICD-10-CM | POA: Diagnosis not present

## 2016-11-26 DIAGNOSIS — F4323 Adjustment disorder with mixed anxiety and depressed mood: Secondary | ICD-10-CM | POA: Diagnosis not present

## 2016-12-02 DIAGNOSIS — F4323 Adjustment disorder with mixed anxiety and depressed mood: Secondary | ICD-10-CM | POA: Diagnosis not present

## 2016-12-06 DIAGNOSIS — F4323 Adjustment disorder with mixed anxiety and depressed mood: Secondary | ICD-10-CM | POA: Diagnosis not present

## 2016-12-10 DIAGNOSIS — F4323 Adjustment disorder with mixed anxiety and depressed mood: Secondary | ICD-10-CM | POA: Diagnosis not present

## 2016-12-20 DIAGNOSIS — F4323 Adjustment disorder with mixed anxiety and depressed mood: Secondary | ICD-10-CM | POA: Diagnosis not present

## 2016-12-28 DIAGNOSIS — F4323 Adjustment disorder with mixed anxiety and depressed mood: Secondary | ICD-10-CM | POA: Diagnosis not present

## 2017-01-03 DIAGNOSIS — F4323 Adjustment disorder with mixed anxiety and depressed mood: Secondary | ICD-10-CM | POA: Diagnosis not present

## 2017-01-07 DIAGNOSIS — F4323 Adjustment disorder with mixed anxiety and depressed mood: Secondary | ICD-10-CM | POA: Diagnosis not present

## 2017-01-26 DIAGNOSIS — F4323 Adjustment disorder with mixed anxiety and depressed mood: Secondary | ICD-10-CM | POA: Diagnosis not present

## 2017-01-31 DIAGNOSIS — F423 Hoarding disorder: Secondary | ICD-10-CM | POA: Diagnosis not present

## 2017-02-01 DIAGNOSIS — Z1231 Encounter for screening mammogram for malignant neoplasm of breast: Secondary | ICD-10-CM | POA: Diagnosis not present

## 2017-02-01 LAB — HM MAMMOGRAPHY

## 2017-02-02 ENCOUNTER — Encounter: Payer: Self-pay | Admitting: Internal Medicine

## 2017-02-02 NOTE — Progress Notes (Signed)
Abstracted and sent to scan  

## 2017-02-03 DIAGNOSIS — F4323 Adjustment disorder with mixed anxiety and depressed mood: Secondary | ICD-10-CM | POA: Diagnosis not present

## 2017-02-07 DIAGNOSIS — F4323 Adjustment disorder with mixed anxiety and depressed mood: Secondary | ICD-10-CM | POA: Diagnosis not present

## 2017-02-10 ENCOUNTER — Ambulatory Visit (INDEPENDENT_AMBULATORY_CARE_PROVIDER_SITE_OTHER): Payer: BLUE CROSS/BLUE SHIELD | Admitting: Nurse Practitioner

## 2017-02-10 ENCOUNTER — Encounter: Payer: Self-pay | Admitting: Nurse Practitioner

## 2017-02-10 ENCOUNTER — Other Ambulatory Visit (INDEPENDENT_AMBULATORY_CARE_PROVIDER_SITE_OTHER): Payer: BLUE CROSS/BLUE SHIELD

## 2017-02-10 VITALS — BP 128/82 | HR 85 | Temp 98.8°F | Ht 64.0 in | Wt 161.0 lb

## 2017-02-10 DIAGNOSIS — R21 Rash and other nonspecific skin eruption: Secondary | ICD-10-CM | POA: Diagnosis not present

## 2017-02-10 DIAGNOSIS — R002 Palpitations: Secondary | ICD-10-CM

## 2017-02-10 DIAGNOSIS — R0789 Other chest pain: Secondary | ICD-10-CM

## 2017-02-10 LAB — CBC
HEMATOCRIT: 43.2 % (ref 36.0–46.0)
HEMOGLOBIN: 14.4 g/dL (ref 12.0–15.0)
MCHC: 33.4 g/dL (ref 30.0–36.0)
MCV: 88.1 fl (ref 78.0–100.0)
Platelets: 218 10*3/uL (ref 150.0–400.0)
RBC: 4.9 Mil/uL (ref 3.87–5.11)
RDW: 13.7 % (ref 11.5–15.5)
WBC: 4.5 10*3/uL (ref 4.0–10.5)

## 2017-02-10 LAB — BASIC METABOLIC PANEL
BUN: 14 mg/dL (ref 6–23)
CHLORIDE: 103 meq/L (ref 96–112)
CO2: 31 mEq/L (ref 19–32)
Calcium: 9.9 mg/dL (ref 8.4–10.5)
Creatinine, Ser: 0.84 mg/dL (ref 0.40–1.20)
GFR: 72.48 mL/min (ref >60.00)
GLUCOSE: 106 mg/dL — AB (ref 70–99)
POTASSIUM: 4.2 meq/L (ref 3.5–5.1)
Sodium: 140 mEq/L (ref 135–145)

## 2017-02-10 LAB — TSH: TSH: 1.45 u[IU]/mL (ref 0.35–4.50)

## 2017-02-10 LAB — MAGNESIUM: MAGNESIUM: 2.2 mg/dL (ref 1.5–2.5)

## 2017-02-10 MED ORDER — HYDROCORTISONE 0.5 % EX OINT: 1.0000 "application " | TOPICAL_OINTMENT | Freq: Two times a day (BID) | CUTANEOUS | 0 refills | Status: DC

## 2017-02-10 NOTE — Progress Notes (Signed)
Subjective:  Patient ID: Jean Smith, female    DOB: 07-14-1950  Age: 66 y.o. MRN: [id number]  CC: Rash (legs) and Nasal Congestion   Rash  This is a new problem. The current episode started in the past 7 days. The problem is unchanged. The affected locations include the left lower leg, left upper leg, right lower leg and right upper leg. The rash is characterized by itchiness and redness. It is unknown if there was an exposure to a precipitant. Pertinent negatives include no congestion, fatigue, fever or shortness of breath. Past treatments include nothing. Her past medical history is significant for allergies. There is no history of asthma, eczema or varicella.   Chest tightness and palpitations: Chronic, waxing and waning, Intermittent  Had similar experience 2015, 2016, and 2017. Evaluated by cardiology, no abnormal finding.  occassional Cough with some clear sputum. No fever, no weight loss.  Outpatient Medications Prior to Visit  Medication Sig Dispense Refill  . ALPRAZolam (XANAX) 0.25 MG tablet TAKE 1 TABLET BY MOUTH 3 TIMES A DAY 90 tablet 2  . Calcium Aspartate 75 MG TABS Take by mouth.    . Cholecalciferol (VITAMIN D) 2000 units CAPS Take by mouth.    . loratadine (CLARITIN) 10 MG tablet Take 10 mg by mouth daily as needed for allergies.     . Magnesium Bisglycinate Dihyd POWD by Does not apply route.     No facility-administered medications prior to visit.     ROS See HPI  Objective:  BP 128/82 (BP Location: Left Arm, Patient Position: Sitting, Cuff Size: Normal)   Pulse 85   Temp 98.8 F (37.1 C) (Oral)   Ht 5\' 4"  (1.626 m)   Wt 161 lb (73 kg)   SpO2 98%   BMI 27.64 kg/m   BP Readings from Last 3 Encounters:  02/10/17 128/82  03/04/16 (!) 142/80  09/01/15 118/70    Wt Readings from Last 3 Encounters:  02/10/17 161 lb (73 kg)  03/04/16 145 lb (65.8 kg)  09/01/15 144 lb 8 oz (65.5 kg)    Physical Exam  Constitutional: She is oriented to person,  place, and time. No distress.  Neck: Normal range of motion. Neck supple. No JVD present.  Cardiovascular: Normal rate, regular rhythm and normal heart sounds.   Pulmonary/Chest: Effort normal and breath sounds normal. She has no wheezes. She has no rales.  Musculoskeletal: She exhibits no edema.  Neurological: She is alert and oriented to person, place, and time.  Skin: Rash noted.  Vitals reviewed.   Lab Results  Component Value Date   WBC 4.5 02/10/2017   HGB 14.4 02/10/2017   HCT 43.2 02/10/2017   PLT 218.0 02/10/2017   GLUCOSE 106 (H) 02/10/2017   CHOL 286 (H) 09/01/2015   TRIG 67.0 09/01/2015   HDL 84.00 09/01/2015   LDLCALC 188 (H) 09/01/2015   ALT 14 09/01/2015   AST 18 09/01/2015   NA 140 02/10/2017   K 4.2 02/10/2017   CL 103 02/10/2017   CREATININE 0.84 02/10/2017   BUN 14 02/10/2017   CO2 31 02/10/2017   TSH 1.45 02/10/2017   HGBA1C 6.3 09/01/2015    Dg Chest 2 View  Result Date: 03/05/2016 CLINICAL DATA:  Chest congestion. EXAM: CHEST  2 VIEW COMPARISON:  12/01/2012. FINDINGS: Mediastinum and hilar structures normal. Cardiomegaly with normal pulmonary vascularity. Biapical pleural-parenchymal thickening noted consistent with scarring.Lungs are otherwise clear. No pleural effusion or pneumothorax. IMPRESSION: Cardiomegaly. No pulmonary venous congestion. No focal  pulmonary infiltrate . Electronically Signed   By: Marcello Moores  Register   On: 03/05/2016 07:25    Assessment & Plan:   Jerney was seen today for rash and nasal congestion.  Diagnoses and all orders for this visit:  Diffuse papular rash -     hydrocortisone ointment 0.5 %; Apply 1 application topically 2 (two) times daily.  Chest tightness or pressure -     CBC; Future -     TSH; Future -     Basic metabolic panel; Future -     Magnesium; Future  Intermittent palpitations -     CBC; Future -     TSH; Future -     Basic metabolic panel; Future -     Magnesium; Future   I am having Jean Smith  start on hydrocortisone ointment. I am also having her maintain her loratadine, Magnesium Bisglycinate Dihyd, Vitamin D, Calcium Aspartate, and ALPRAZolam.  Meds ordered this encounter  Medications  . hydrocortisone ointment 0.5 %    Sig: Apply 1 application topically 2 (two) times daily.    Dispense:  30 g    Refill:  0    Order Specific Question:   Supervising Provider    Answer:   Cassandria Anger [[id number]]    Follow-up: Return if symptoms worsen or fail to improve.  Wilfred Lacy, NP

## 2017-02-10 NOTE — Patient Instructions (Addendum)
Contact cardiology for appt ASAP.  Stable labs results. F/up with cardiology as discussed.  Rash is related to contact dermatitis. Use cortisone cream as prescribed.

## 2017-02-23 ENCOUNTER — Encounter: Payer: BLUE CROSS/BLUE SHIELD | Admitting: Internal Medicine

## 2017-02-25 ENCOUNTER — Ambulatory Visit (INDEPENDENT_AMBULATORY_CARE_PROVIDER_SITE_OTHER): Payer: BLUE CROSS/BLUE SHIELD | Admitting: Internal Medicine

## 2017-02-25 ENCOUNTER — Encounter: Payer: Self-pay | Admitting: Internal Medicine

## 2017-02-25 VITALS — BP 130/80 | HR 98 | Temp 97.7°F | Ht 64.0 in | Wt 164.0 lb

## 2017-02-25 DIAGNOSIS — F411 Generalized anxiety disorder: Secondary | ICD-10-CM | POA: Diagnosis not present

## 2017-02-25 DIAGNOSIS — Z23 Encounter for immunization: Secondary | ICD-10-CM | POA: Diagnosis not present

## 2017-02-25 DIAGNOSIS — Z0000 Encounter for general adult medical examination without abnormal findings: Secondary | ICD-10-CM

## 2017-02-25 DIAGNOSIS — E785 Hyperlipidemia, unspecified: Secondary | ICD-10-CM

## 2017-02-25 NOTE — Assessment & Plan Note (Signed)
Labs reviewed with patient from prior visit. She declines labs today although lipid and HgA1c needs to be done. Colonoscopy not done and with stress she will try to schedule. Also mammogram due and she will try to get that done. Counseled on sun safety and mole surveillance. Given screening recommendations.

## 2017-02-25 NOTE — Progress Notes (Signed)
   Subjective:    Patient ID: Jean Smith, female    DOB: 07/05/1950, 66 y.o.   MRN: [id number]  HPI The patient is a 66 YO female coming in for wellness. No new concerns. Still worried about her son (alcoholic and autistic causing her a lot of stress).   PMH, [facility], social history reviewed and updated.   Review of Systems  Constitutional: Positive for unexpected weight change. Negative for activity change, appetite change, diaphoresis and fatigue.  HENT: Negative.   Eyes: Negative.   Respiratory: Negative for cough, chest tightness and shortness of breath.   Cardiovascular: Negative for chest pain, palpitations and leg swelling.  Gastrointestinal: Negative for abdominal distention, abdominal pain, constipation, diarrhea, nausea and vomiting.  Musculoskeletal: Negative.   Skin: Negative.   Neurological: Negative.   Psychiatric/Behavioral: Positive for decreased concentration, dysphoric mood and sleep disturbance. Negative for self-injury and suicidal ideas. The patient is nervous/anxious.       Objective:   Physical Exam  Constitutional: She is oriented to person, place, and time. She appears well-developed and well-nourished.  Overweight  HENT:  Head: Normocephalic and atraumatic.  Eyes: EOM are normal.  Neck: Normal range of motion.  Cardiovascular: Normal rate and regular rhythm.   Pulmonary/Chest: Effort normal and breath sounds normal. No respiratory distress. She has no wheezes. She has no rales.  Abdominal: Soft. Bowel sounds are normal. She exhibits no distension. There is no tenderness. There is no rebound.  Musculoskeletal: She exhibits no edema.  Neurological: She is alert and oriented to person, place, and time. Coordination normal.  Skin: Skin is warm and dry.  Psychiatric: She has a normal mood and affect.   Vitals:   02/25/17 0910  BP: 130/80  Pulse: 98  Temp: 97.7 F (36.5 C)  TempSrc: Oral  SpO2: 99%  Weight: 164 lb (74.4 kg)  Height: 5\' 4"  (1.626 m)        Assessment & Plan:  Flu shot given at visit

## 2017-02-25 NOTE — Patient Instructions (Signed)
We have given you the flu shot today, work on losing about 10 pounds and starting back to exercising.   Health Maintenance, Female Adopting a healthy lifestyle and getting preventive care can go a long way to promote health and wellness. Talk with your health care provider about what schedule of regular examinations is right for you. This is a good chance for you to check in with your provider about disease prevention and staying healthy. In between checkups, there are plenty of things you can do on your own. Experts have done a lot of research about which lifestyle changes and preventive measures are most likely to keep you healthy. Ask your health care provider for more information. Weight and diet Eat a healthy diet  Be sure to include plenty of vegetables, fruits, low-fat dairy products, and lean protein.  Do not eat a lot of foods high in solid fats, added sugars, or salt.  Get regular exercise. This is one of the most important things you can do for your health. ? Most adults should exercise for at least 150 minutes each week. The exercise should increase your heart rate and make you sweat (moderate-intensity exercise). ? Most adults should also do strengthening exercises at least twice a week. This is in addition to the moderate-intensity exercise.  Maintain a healthy weight  Body mass index (BMI) is a measurement that can be used to identify possible weight problems. It estimates body fat based on height and weight. Your health care provider can help determine your BMI and help you achieve or maintain a healthy weight.  For females 67 years of age and older: ? A BMI below 18.5 is considered underweight. ? A BMI of 18.5 to 24.9 is normal. ? A BMI of 25 to 29.9 is considered overweight. ? A BMI of 30 and above is considered obese.  Watch levels of cholesterol and blood lipids  You should start having your blood tested for lipids and cholesterol at 64 years of age, then have this  test every 5 years.  You may need to have your cholesterol levels checked more often if: ? Your lipid or cholesterol levels are high. ? You are older than 64 years of age. ? You are at high risk for heart disease.  Cancer screening Lung Cancer  Lung cancer screening is recommended for adults 33-22 years old who are at high risk for lung cancer because of a history of smoking.  A yearly low-dose CT scan of the lungs is recommended for people who: ? Currently smoke. ? Have quit within the past 15 years. ? Have at least a 30-pack-year history of smoking. A pack year is smoking an average of one pack of cigarettes a day for 1 year.  Yearly screening should continue until it has been 15 years since you quit.  Yearly screening should stop if you develop a health problem that would prevent you from having lung cancer treatment.  Breast Cancer  Practice breast self-awareness. This means understanding how your breasts normally appear and feel.  It also means doing regular breast self-exams. Let your health care provider know about any changes, no matter how small.  If you are in your 20s or 30s, you should have a clinical breast exam (CBE) by a health care provider every 1-3 years as part of a regular health exam.  If you are 64 or older, have a CBE every year. Also consider having a breast X-ray (mammogram) every year.  If you have a  family history of breast cancer, talk to your health care provider about genetic screening.  If you are at high risk for breast cancer, talk to your health care provider about having an MRI and a mammogram every year.  Breast cancer gene (BRCA) assessment is recommended for women who have family members with BRCA-related cancers. BRCA-related cancers include: ? Breast. ? Ovarian. ? Tubal. ? Peritoneal cancers.  Results of the assessment will determine the need for genetic counseling and BRCA1 and BRCA2 testing.  Cervical Cancer Your health care  provider may recommend that you be screened regularly for cancer of the pelvic organs (ovaries, uterus, and vagina). This screening involves a pelvic examination, including checking for microscopic changes to the surface of your cervix (Pap test). You may be encouraged to have this screening done every 3 years, beginning at age 36.  For women ages 27-65, health care providers may recommend pelvic exams and Pap testing every 3 years, or they may recommend the Pap and pelvic exam, combined with testing for human papilloma virus (HPV), every 5 years. Some types of HPV increase your risk of cervical cancer. Testing for HPV may also be done on women of any age with unclear Pap test results.  Other health care providers may not recommend any screening for nonpregnant women who are considered low risk for pelvic cancer and who do not have symptoms. Ask your health care provider if a screening pelvic exam is right for you.  If you have had past treatment for cervical cancer or a condition that could lead to cancer, you need Pap tests and screening for cancer for at least 20 years after your treatment. If Pap tests have been discontinued, your risk factors (such as having a new sexual partner) need to be reassessed to determine if screening should resume. Some women have medical problems that increase the chance of getting cervical cancer. In these cases, your health care provider may recommend more frequent screening and Pap tests.  Colorectal Cancer  This type of cancer can be detected and often prevented.  Routine colorectal cancer screening usually begins at 64 years of age and continues through 64 years of age.  Your health care provider may recommend screening at an earlier age if you have risk factors for colon cancer.  Your health care provider may also recommend using home test kits to check for hidden blood in the stool.  A small camera at the end of a tube can be used to examine your colon  directly (sigmoidoscopy or colonoscopy). This is done to check for the earliest forms of colorectal cancer.  Routine screening usually begins at age 47.  Direct examination of the colon should be repeated every 5-10 years through 64 years of age. However, you may need to be screened more often if early forms of precancerous polyps or small growths are found.  Skin Cancer  Check your skin from head to toe regularly.  Tell your health care provider about any new moles or changes in moles, especially if there is a change in a mole's shape or color.  Also tell your health care provider if you have a mole that is larger than the size of a pencil eraser.  Always use sunscreen. Apply sunscreen liberally and repeatedly throughout the day.  Protect yourself by wearing long sleeves, pants, a wide-brimmed hat, and sunglasses whenever you are outside.  Heart disease, diabetes, and high blood pressure  High blood pressure causes heart disease and increases the risk of  stroke. High blood pressure is more likely to develop in: ? People who have blood pressure in the high end of the normal range (130-139/85-89 mm Hg). ? People who are overweight or obese. ? People who are African American.  If you are 34-76 years of age, have your blood pressure checked every 3-5 years. If you are 28 years of age or older, have your blood pressure checked every year. You should have your blood pressure measured twice-once when you are at a hospital or clinic, and once when you are not at a hospital or clinic. Record the average of the two measurements. To check your blood pressure when you are not at a hospital or clinic, you can use: ? An automated blood pressure machine at a pharmacy. ? A home blood pressure monitor.  If you are between 33 years and 77 years old, ask your health care provider if you should take aspirin to prevent strokes.  Have regular diabetes screenings. This involves taking a blood sample to  check your fasting blood sugar level. ? If you are at a normal weight and have a low risk for diabetes, have this test once every three years after 64 years of age. ? If you are overweight and have a high risk for diabetes, consider being tested at a younger age or more often. Preventing infection Hepatitis B  If you have a higher risk for hepatitis B, you should be screened for this virus. You are considered at high risk for hepatitis B if: ? You were born in a country where hepatitis B is common. Ask your health care provider which countries are considered high risk. ? Your parents were born in a high-risk country, and you have not been immunized against hepatitis B (hepatitis B vaccine). ? You have HIV or AIDS. ? You use needles to inject street drugs. ? You live with someone who has hepatitis B. ? You have had sex with someone who has hepatitis B. ? You get hemodialysis treatment. ? You take certain medicines for conditions, including cancer, organ transplantation, and autoimmune conditions.  Hepatitis C  Blood testing is recommended for: ? Everyone born from [birth year] through 1963. ? Anyone with known risk factors for hepatitis C.  Sexually transmitted infections (STIs)  You should be screened for sexually transmitted infections (STIs) including gonorrhea and chlamydia if: ? You are sexually active and are younger than 64 years of age. ? You are older than 64 years of age and your health care provider tells you that you are at risk for this type of infection. ? Your sexual activity has changed since you were last screened and you are at an increased risk for chlamydia or gonorrhea. Ask your health care provider if you are at risk.  If you do not have HIV, but are at risk, it may be recommended that you take a prescription medicine daily to prevent HIV infection. This is called pre-exposure prophylaxis (PrEP). You are considered at risk if: ? You are sexually active and do not regularly  use condoms or know the HIV status of your partner(s). ? You take drugs by injection. ? You are sexually active with a partner who has HIV.  Talk with your health care provider about whether you are at high risk of being infected with HIV. If you choose to begin PrEP, you should first be tested for HIV. You should then be tested every 3 months for as long as you are taking PrEP. Pregnancy  If  you are premenopausal and you may become pregnant, ask your health care provider about preconception counseling.  If you may become pregnant, take 400 to 800 micrograms (mcg) of folic acid every day.  If you want to prevent pregnancy, talk to your health care provider about birth control (contraception). Osteoporosis and menopause  Osteoporosis is a disease in which the bones lose minerals and strength with aging. This can result in serious bone fractures. Your risk for osteoporosis can be identified using a bone density scan.  If you are 10 years of age or older, or if you are at risk for osteoporosis and fractures, ask your health care provider if you should be screened.  Ask your health care provider whether you should take a calcium or vitamin D supplement to lower your risk for osteoporosis.  Menopause may have certain physical symptoms and risks.  Hormone replacement therapy may reduce some of these symptoms and risks. Talk to your health care provider about whether hormone replacement therapy is right for you. Follow these instructions at home:  Schedule regular health, dental, and eye exams.  Stay current with your immunizations.  Do not use any tobacco products including cigarettes, chewing tobacco, or electronic cigarettes.  If you are pregnant, do not drink alcohol.  If you are breastfeeding, limit how much and how often you drink alcohol.  Limit alcohol intake to no more than 1 drink per day for nonpregnant women. One drink equals 12 ounces of beer, 5 ounces of wine, or 1 ounces  of hard liquor.  Do not use street drugs.  Do not share needles.  Ask your health care provider for help if you need support or information about quitting drugs.  Tell your health care provider if you often feel depressed.  Tell your health care provider if you have ever been abused or do not feel safe at home. This information is not intended to replace advice given to you by your health care provider. Make sure you discuss any questions you have with your health care provider. Document Released: 12/14/2010 Document Revised: 11/06/2015 Document Reviewed: 03/04/2015 Elsevier Interactive Patient Education  [employer].

## 2017-02-25 NOTE — Assessment & Plan Note (Signed)
Declines statins and does not want to check today due to weight gain she fears it is up.

## 2017-02-25 NOTE — Addendum Note (Signed)
Addended by: Raford Pitcher R on: 02/25/2017 11:43 AM   Modules accepted: Orders

## 2017-02-25 NOTE — Assessment & Plan Note (Signed)
Undergoing counseling and using xanax up to TID prn. She is not coping well and is struggling a lot with family situations.

## 2017-03-01 DIAGNOSIS — F4323 Adjustment disorder with mixed anxiety and depressed mood: Secondary | ICD-10-CM | POA: Diagnosis not present

## 2017-03-04 DIAGNOSIS — F4323 Adjustment disorder with mixed anxiety and depressed mood: Secondary | ICD-10-CM | POA: Diagnosis not present

## 2017-03-07 DIAGNOSIS — F4323 Adjustment disorder with mixed anxiety and depressed mood: Secondary | ICD-10-CM | POA: Diagnosis not present

## 2017-03-09 ENCOUNTER — Encounter: Payer: Self-pay | Admitting: Cardiology

## 2017-03-09 ENCOUNTER — Ambulatory Visit (INDEPENDENT_AMBULATORY_CARE_PROVIDER_SITE_OTHER): Payer: BLUE CROSS/BLUE SHIELD | Admitting: Cardiology

## 2017-03-09 VITALS — BP 122/64 | HR 72 | Ht 64.0 in | Wt 162.2 lb

## 2017-03-09 DIAGNOSIS — R002 Palpitations: Secondary | ICD-10-CM

## 2017-03-09 DIAGNOSIS — I493 Ventricular premature depolarization: Secondary | ICD-10-CM | POA: Diagnosis not present

## 2017-03-09 DIAGNOSIS — E785 Hyperlipidemia, unspecified: Secondary | ICD-10-CM

## 2017-03-09 MED ORDER — PROPRANOLOL HCL 10 MG PO TABS: 10.0000 mg | ORAL_TABLET | Freq: Every day | ORAL | 3 refills | Status: DC | PRN

## 2017-03-09 NOTE — Progress Notes (Signed)
Cardiology Office Note:    Date:  03/09/2017   ID:  Jean Smith, DOB 09-13-1950, MRN [id number]  PCP:  Hoyt Koch, MD  Cardiologist:  Dr. Acie Fredrickson  Referring MD: Hoyt Koch, *   Chief Complaint  Patient presents with  . Follow-up    PVC's    History of Present Illness:    Jean Smith is a 66 y.o. female with a past medical history significant for Mitral valve prolapse and palpitations, normal stress echo in 2009 with normal LV function, normal stress Myoview in 2014.  The patient has had several years of complaints of PVCs and symptoms of dizziness and occasionally near syncope. She was found to have mitral valve prolapse and also has been noted to have a fairly high caffeine intake. She had normal carotid artery ultrasound in 2014. She has been advised to decrease her caffeine intake that is mostly tea and chocolate. She has been prescribed metoprolol which she was not taking. Most recently in 2016 she was prescribed propranolol 10 mg as needed.  She was last seen by Dr. Acie Fredrickson 10/18/2014.  She is here today alone. She was told by her PCP that seh had an irregular heart beat and should follow up with cardiology. EKG and auscultation reveals PVC's which have been occurring for years. She says that she has had a bad year. She has a lot of stress related to caring for her 8 year old autistic son who is also an alcoholic, has lost a fiend to suicide and is worried about her 38 yr old mother. She has reportedly gained about 20 pounds. She has palpitations occasionally, not more than she has had in the past. She admits to mild shortness ogf breath with climbing stairs, but relates this to her wt gain. She says that she always has more trouble breathing when she gains wt and it gets better when she loses it. No chest pain, orthopnea, PND, edema, syncope or near syncope. She has rare lightheadedness  Upon standing, usually when she is very stressed. She takes Xanax  occasionally for stress and sees a therapist regularly. She is still drinking tea all day long and eating Nutella (chocolate) before bed every night. She takes claritin on occasion but denies any other stimulant type medications.   She has been told for years that she has high cholesterol and took a statin once that she stopped due to leg pain. Her PCP is working with her on increasing her activity and losing wt. She has joined a fitness center and is staring to walk for exercise.   Past Medical History:  Diagnosis Date  . Anxiety   . Diverticulosis   . Fuchs' endothelial dystrophy    follows with optho regularly   . Hyperlipidemia   . Hyperplastic colon polyp   . Mitral valve prolapse   . PVC's (premature ventricular contractions)    intol of BB, sxc palpitations  . RVOT ventricular tachycardia/PVCs    EP eval 01/2013 for freq PVCs    Past Surgical History:  Procedure Laterality Date  . CESAREAN SECTION     x 3  . KNEE SURGERY Right   . MANDIBLE SURGERY      Current Medications: Current Meds  Medication Sig  . ALPRAZolam (XANAX) 0.25 MG tablet Take 0.25 mg by mouth 3 (three) times daily as needed for anxiety.  . hydrocortisone ointment 0.5 % Apply 1 application topically 2 (two) times daily.  Marland Kitchen loratadine (CLARITIN) 10 MG tablet  Take 10 mg by mouth daily as needed for allergies.   . Magnesium Bisglycinate Dihyd POWD Take 1 tablet by mouth 2 (two) times daily.      Allergies:   Atorvastatin; Crestor [rosuvastatin calcium]; Latex; Lipitor [atorvastatin calcium]; Other; Pollen extract; and Vitamin d analogs   Social History   Social History  . Marital status: Married    Spouse name: N/A  . Number of children: 3  . Years of education: N/A   Social History Main Topics  . Smoking status: Former Smoker    Quit date: 06/15/1983  . Smokeless tobacco: Never Used  . Alcohol use Yes     Comment: rare  . Drug use: No  . Sexual activity: Not Asked   Other Topics Concern  .  None   Social History Narrative   Regular exercise: yes   Caffeine use: none     Family History: The patient's family history includes Alcohol abuse in her unknown relative; Colon cancer in her maternal grandmother and mother; Colon polyps in her sister; Heart disease in her father. ROS:   Please see the history of present illness.     All other systems reviewed and are negative.  EKGs/Labs/Other Studies Reviewed:    The following studies were reviewed today:  Myocardial perfusion imaging 12/22/2012: Normal per Dr. Acie Fredrickson  Stress echocardiogram 12/06/12 Study Conclusions  - Stress ECG conclusions: The stress ECG was negative for ischemia. - Impressions: ECG with LVH and PVC;s no ischemia With exercise basal function improves but septum and apex appear hypokinetic Consider futhrer testing Impressions: - ECG with LVH and PVC;s no ischemia With exercise basal function improves but septum and apex appear hypokinetic Consider futhrer testing  Echocardiogram 12/05/2012 Study Conclusions - Left ventricle: The cavity size was normal. Wall thickness was increased in a pattern of mild LVH. Systolic function was normal. The estimated ejection fraction was in the range of 55% to 60%. Wall motion was normal; there were no regional wall motion abnormalities. Doppler parameters are consistent with abnormal left ventricular relaxation (grade 1 diastolic dysfunction). - Aortic valve: Mild regurgitation. - Mitral valve: Moderate prolapse, involving the posterior leaflet. Mild to moderate regurgitation. - Left atrium: The atrium was mildly dilated.  EKG:  EKG is ordered today.  The ekg ordered today demonstrates Sinus rhythm at 78 bpm with PVC  Recent Labs: 02/10/2017: BUN 14; Creatinine, Ser 0.84; Hemoglobin 14.4; Magnesium 2.2; Platelets 218.0; Potassium 4.2; Sodium 140; TSH 1.45   Recent Lipid Panel    Component Value Date/Time   CHOL 286 (H) 09/01/2015  1103   TRIG 67.0 09/01/2015 1103   HDL 84.00 09/01/2015 1103   CHOLHDL 3 09/01/2015 1103   VLDL 13.4 09/01/2015 1103   LDLCALC 188 (H) 09/01/2015 1103    Physical Exam:    VS:  BP 122/64   Pulse 72   Ht 5\' 4"  (1.626 m)   Wt 162 lb 3.2 oz (73.6 kg)   BMI 27.84 kg/m     Wt Readings from Last 3 Encounters:  03/09/17 162 lb 3.2 oz (73.6 kg)  02/25/17 164 lb (74.4 kg)  02/10/17 161 lb (73 kg)     Physical Exam  Constitutional: She is oriented to person, place, and time. She appears well-developed and well-nourished.  HENT:  Head: Normocephalic and atraumatic.  Neck: Normal range of motion. Neck supple. No JVD present.  Cardiovascular: Normal rate and normal heart sounds.  Exam reveals no gallop and no friction rub.   No murmur heard.  Irregular beat avery 8 to 12 beats  Pulmonary/Chest: Effort normal and breath sounds normal. No respiratory distress.  Abdominal: Soft. Bowel sounds are normal. She exhibits no distension. There is no tenderness.  Musculoskeletal: Normal range of motion. She exhibits no edema or deformity.  Neurological: She is alert and oriented to person, place, and time.  Skin: Skin is warm and dry.  Psychiatric: She has a normal mood and affect. Her behavior is normal. Thought content normal.     ASSESSMENT:    No diagnosis found. PLAN:    In order of problems listed above:  Premature ventricular contractions: Patient has a history of PVCs and mitral valve prolapse per echo, normal LV function and stress test in 2014. No chest pain. No murmur on exam. She continues to have PVC's, no worse than previously. She seems to be tolerating them well. She is still drinking tea all day long and eating chocolate before bed. Advised to reduce her tea intake and/or switch to decaf and limit her chocolate intake especially before bed. Advised to avoid decongestants and other cardiac stimulants. Advised to continue to work on stress reduction and continue with her plan to  get into an exercise routine. She can follow up in a year and she knows to call us with any chest pain or change in symptoms.  Hyperlipidemia: LDL 188. Pt has no previous hx of CVD. Has been on statin in the past and stopped due to leg pain. She is working with her PCP to try weight loss. Advise to consider low dose statin if continues hyperlipidemia after wt loss and exercise.   Medication Adjustments/Labs and Tests Ordered: Current medicines are reviewed at length with the patient today.  Concerns regarding medicines are outlined above. Labs and tests ordered and medication changes are outlined in the patient instructions below:  There are no Patient Instructions on file for this visit.   Signed, Daune Perch, NP  03/09/2017 11:46 AM    [place]

## 2017-03-09 NOTE — Patient Instructions (Signed)
Medication Instructions:  1. Start Propranolol 10 mg 1 tablet by mouth daily as needed for palpitations  Labwork: None ordered today  Testing/Procedures: None ordered today  Follow-Up: Your physician wants you to follow-up in 1 year with Dr. Acie Fredrickson. You will receive a reminder letter in the mail two months in advance. If you don't receive a letter, please call our office to schedule the follow-up appointment.   Any Other Special Instructions Will Be Listed Below (If Applicable).     If you need a refill on your cardiac medications before your next appointment, please call your pharmacy.

## 2017-03-21 DIAGNOSIS — F4323 Adjustment disorder with mixed anxiety and depressed mood: Secondary | ICD-10-CM | POA: Diagnosis not present

## 2017-03-24 DIAGNOSIS — F4323 Adjustment disorder with mixed anxiety and depressed mood: Secondary | ICD-10-CM | POA: Diagnosis not present

## 2017-03-28 DIAGNOSIS — F4323 Adjustment disorder with mixed anxiety and depressed mood: Secondary | ICD-10-CM | POA: Diagnosis not present

## 2017-04-04 DIAGNOSIS — F4323 Adjustment disorder with mixed anxiety and depressed mood: Secondary | ICD-10-CM | POA: Diagnosis not present

## 2017-04-07 DIAGNOSIS — F4323 Adjustment disorder with mixed anxiety and depressed mood: Secondary | ICD-10-CM | POA: Diagnosis not present

## 2017-04-11 DIAGNOSIS — F4323 Adjustment disorder with mixed anxiety and depressed mood: Secondary | ICD-10-CM | POA: Diagnosis not present

## 2017-04-18 ENCOUNTER — Other Ambulatory Visit: Payer: Self-pay | Admitting: Internal Medicine

## 2017-04-19 NOTE — Telephone Encounter (Signed)
Faxed to [facility] on [address]

## 2017-04-21 DIAGNOSIS — F4323 Adjustment disorder with mixed anxiety and depressed mood: Secondary | ICD-10-CM | POA: Diagnosis not present

## 2017-04-25 DIAGNOSIS — F4323 Adjustment disorder with mixed anxiety and depressed mood: Secondary | ICD-10-CM | POA: Diagnosis not present

## 2017-04-28 DIAGNOSIS — F4323 Adjustment disorder with mixed anxiety and depressed mood: Secondary | ICD-10-CM | POA: Diagnosis not present

## 2017-04-29 DIAGNOSIS — F4320 Adjustment disorder, unspecified: Secondary | ICD-10-CM | POA: Diagnosis not present

## 2017-05-27 DIAGNOSIS — L218 Other seborrheic dermatitis: Secondary | ICD-10-CM | POA: Diagnosis not present

## 2017-05-27 DIAGNOSIS — D2262 Melanocytic nevi of left upper limb, including shoulder: Secondary | ICD-10-CM | POA: Diagnosis not present

## 2017-05-27 DIAGNOSIS — D225 Melanocytic nevi of trunk: Secondary | ICD-10-CM | POA: Diagnosis not present

## 2017-05-27 DIAGNOSIS — L738 Other specified follicular disorders: Secondary | ICD-10-CM | POA: Diagnosis not present

## 2017-07-06 DIAGNOSIS — F4323 Adjustment disorder with mixed anxiety and depressed mood: Secondary | ICD-10-CM | POA: Diagnosis not present

## 2017-07-07 DIAGNOSIS — H43393 Other vitreous opacities, bilateral: Secondary | ICD-10-CM | POA: Diagnosis not present

## 2017-07-07 DIAGNOSIS — H3562 Retinal hemorrhage, left eye: Secondary | ICD-10-CM | POA: Diagnosis not present

## 2017-07-11 DIAGNOSIS — F4323 Adjustment disorder with mixed anxiety and depressed mood: Secondary | ICD-10-CM | POA: Diagnosis not present

## 2017-07-18 DIAGNOSIS — F4323 Adjustment disorder with mixed anxiety and depressed mood: Secondary | ICD-10-CM | POA: Diagnosis not present

## 2017-07-20 ENCOUNTER — Encounter: Payer: Self-pay | Admitting: Gastroenterology

## 2017-07-25 DIAGNOSIS — F4323 Adjustment disorder with mixed anxiety and depressed mood: Secondary | ICD-10-CM | POA: Diagnosis not present

## 2017-08-08 DIAGNOSIS — F4323 Adjustment disorder with mixed anxiety and depressed mood: Secondary | ICD-10-CM | POA: Diagnosis not present

## 2017-08-11 DIAGNOSIS — H43393 Other vitreous opacities, bilateral: Secondary | ICD-10-CM | POA: Diagnosis not present

## 2017-08-11 DIAGNOSIS — H1851 Endothelial corneal dystrophy: Secondary | ICD-10-CM | POA: Diagnosis not present

## 2017-08-18 ENCOUNTER — Encounter: Payer: Self-pay | Admitting: *Deleted

## 2017-08-22 ENCOUNTER — Ambulatory Visit: Payer: BLUE CROSS/BLUE SHIELD | Admitting: Family Medicine

## 2017-08-22 ENCOUNTER — Other Ambulatory Visit (INDEPENDENT_AMBULATORY_CARE_PROVIDER_SITE_OTHER): Payer: BLUE CROSS/BLUE SHIELD

## 2017-08-22 VITALS — BP 120/86 | HR 99 | Temp 98.5°F | Wt 165.8 lb

## 2017-08-22 DIAGNOSIS — R1032 Left lower quadrant pain: Secondary | ICD-10-CM

## 2017-08-22 LAB — CBC
HCT: 40.4 % (ref 36.0–46.0)
HEMOGLOBIN: 13.7 g/dL (ref 12.0–15.0)
MCHC: 33.9 g/dL (ref 30.0–36.0)
MCV: 87 fl (ref 78.0–100.0)
Platelets: 241 10*3/uL (ref 150.0–400.0)
RBC: 4.64 Mil/uL (ref 3.87–5.11)
RDW: 13.4 % (ref 11.5–15.5)
WBC: 9.3 10*3/uL (ref 4.0–10.5)

## 2017-08-22 LAB — C-REACTIVE PROTEIN: CRP: 3 mg/dL (ref 0.5–20.0)

## 2017-08-22 NOTE — Progress Notes (Signed)
Jean Smith - 67 y.o. female MRN [id number]  Date of birth: Nov 27, 1950  SUBJECTIVE:  Including CC & ROS.  Chief Complaint  Patient presents with  . Abdominal Pain    lower abdominal pain    Jean Smith is a 67 y.o. female that is  Presenting with left lower quadrant abdominal pain. This is been ongoing for a few days. The pain has improved recently.The pain is localized to this area. She denies any fevers or chills. She has had normal bowel movements. She states the pain feels similar to her history of previous episode of diverticulitis. She has been moving her mother and doing more extensive physical labor than she normally does. Has not had any significant changes in her eating patterns. Shea or vomiting and is moderate to seven nature. Pain is sharp and stabbing..  Review of CT abdomen pelvis from 2016 shows focal diverticulitis.   Review of Systems  Constitutional: Negative for fever.  Respiratory: Negative for cough.   Gastrointestinal: Positive for abdominal pain. Negative for constipation, diarrhea and vomiting.  Musculoskeletal: Negative for gait problem.    HISTORY: Past Medical, Surgical, Social, and Family History Reviewed & Updated per EMR.   Pertinent Historical Findings include:  Past Medical History:  Diagnosis Date  . Anxiety   . Diverticulosis   . Fuchs' endothelial dystrophy    follows with optho regularly   . Hyperlipidemia   . Hyperplastic colon polyp   . Mitral valve prolapse   . PVC's (premature ventricular contractions)    intol of BB, sxc palpitations  . RVOT ventricular tachycardia/PVCs    EP eval 01/2013 for freq PVCs    Past Surgical History:  Procedure Laterality Date  . CESAREAN SECTION     x 3  . KNEE SURGERY Right   . MANDIBLE SURGERY      Allergies  Allergen Reactions  . Atorvastatin Other (See Comments)    Sensitive   . Crestor [Rosuvastatin Calcium]     Muscle ache  . Latex Swelling  . Lipitor [Atorvastatin Calcium]    Muscle ache  . Other     Grass, local trees, cats and dogs  . Pollen Extract   . Vitamin D Analogs     Skin Rash    Family History  Problem Relation Age of Onset  . Colon cancer Mother   . Heart disease Father   . Colon cancer Maternal Grandmother   . Colon polyps Sister   . Alcohol abuse Unknown      Social History   Socioeconomic History  . Marital status: Married    Spouse name: Not on file  . Number of children: 3  . Years of education: Not on file  . Highest education level: Not on file  Social Needs  . Financial resource strain: Not on file  . Food insecurity - worry: Not on file  . Food insecurity - inability: Not on file  . Transportation needs - medical: Not on file  . Transportation needs - non-medical: Not on file  Occupational History  . Not on file  Tobacco Use  . Smoking status: Former Smoker    Last attempt to quit: 06/15/1983    Years since quitting: 34.2  . Smokeless tobacco: Never Used  Substance and Sexual Activity  . Alcohol use: Yes    Comment: rare  . Drug use: No  . Sexual activity: Not on file  Other Topics Concern  . Not on file  Social History Narrative  Regular exercise: yes   Caffeine use: none     PHYSICAL EXAM:  VS: BP 120/86 (BP Location: Left Arm, Patient Position: Sitting, Cuff Size: Normal)   Pulse 99   Temp 98.5 F (36.9 C) (Oral)   Wt 165 lb 12.8 oz (75.2 kg)   BMI 28.46 kg/m  Physical Exam Gen: NAD, alert, cooperative with exam, well-appearing ENT: normal lips, normal nasal mucosa,  Eye: normal EOM, normal conjunctiva and lids CV:  no edema, +2 pedal pulses   Resp: no accessory muscle use, non-labored,  GI: no masses mild tenderness in the left lower quadrant,soft,hypoactive bowel sounds, no rigidity , no hernia  Skin: no rashes, no areas of induration  Neuro: normal tone, normal sensation to touch Psych:  normal insight, alert and oriented MSK: normal gait, normal strength      ASSESSMENT & PLAN:   Left  lower quadrant pain Possibly related to her history of diverticulitis. Denies any dysuria. Does have a history of constipation from time to time. - CRP and CBC today.  - will see what lab work shows and consider sending in [place].

## 2017-08-22 NOTE — Patient Instructions (Signed)
We will call you with the results from today.  

## 2017-08-22 NOTE — Assessment & Plan Note (Addendum)
Possibly related to her history of diverticulitis. Denies any dysuria. Does have a history of constipation from time to time. - CRP and CBC today.  - will see what lab work shows and consider sending in [place].

## 2017-08-23 ENCOUNTER — Telehealth: Payer: Self-pay | Admitting: Family Medicine

## 2017-08-23 MED ORDER — CIPROFLOXACIN HCL 500 MG PO TABS: 500.0000 mg | ORAL_TABLET | Freq: Two times a day (BID) | ORAL | 0 refills | Status: DC

## 2017-08-23 MED ORDER — METRONIDAZOLE 500 MG PO TABS: 500.0000 mg | ORAL_TABLET | Freq: Four times a day (QID) | ORAL | 0 refills | Status: AC

## 2017-08-23 NOTE — Telephone Encounter (Signed)
Spoke with patient about her labs. She had a fever last night but pain is less today. Will send in ABX that she can have on hand if her symptoms do not improve.   Rosemarie Ax, MD [facility] 08/23/2017, 12:36 PM

## 2017-09-05 ENCOUNTER — Ambulatory Visit (AMBULATORY_SURGERY_CENTER): Payer: Self-pay | Admitting: *Deleted

## 2017-09-05 ENCOUNTER — Other Ambulatory Visit: Payer: Self-pay

## 2017-09-05 VITALS — Ht 63.0 in | Wt 166.8 lb

## 2017-09-05 DIAGNOSIS — Z800 Family history of malignant neoplasm of digestive organs: Secondary | ICD-10-CM

## 2017-09-05 NOTE — Progress Notes (Signed)
Patient denies any allergies to egg or soy products. Patient states nausea with general anesthesia.  No anesthesia problems with colonoscopy procedures. Patient denies oxygen use at home and denies diet medications. Patient denies information on Colonoscopy procedure.Jean Smith

## 2017-09-06 ENCOUNTER — Encounter: Payer: Self-pay | Admitting: Gastroenterology

## 2017-09-19 ENCOUNTER — Other Ambulatory Visit: Payer: Self-pay

## 2017-09-19 ENCOUNTER — Ambulatory Visit (AMBULATORY_SURGERY_CENTER): Payer: BLUE CROSS/BLUE SHIELD | Admitting: Gastroenterology

## 2017-09-19 ENCOUNTER — Encounter: Payer: Self-pay | Admitting: Gastroenterology

## 2017-09-19 VITALS — BP 134/65 | HR 75 | Temp 99.3°F | Resp 15 | Ht 63.0 in | Wt 166.0 lb

## 2017-09-19 DIAGNOSIS — Z86010 Personal history of colonic polyps: Secondary | ICD-10-CM | POA: Diagnosis not present

## 2017-09-19 DIAGNOSIS — Z800 Family history of malignant neoplasm of digestive organs: Secondary | ICD-10-CM | POA: Diagnosis not present

## 2017-09-19 DIAGNOSIS — D123 Benign neoplasm of transverse colon: Secondary | ICD-10-CM | POA: Diagnosis not present

## 2017-09-19 DIAGNOSIS — K635 Polyp of colon: Secondary | ICD-10-CM | POA: Diagnosis not present

## 2017-09-19 MED ORDER — SODIUM CHLORIDE 0.9 % IV SOLN: 500.0000 mL | Freq: Once | INTRAVENOUS | Status: DC

## 2017-09-19 NOTE — Op Note (Signed)
[place] Patient Name: Jean Smith Procedure Date: 09/19/2017 8:47 AM MRN: [id number] Endoscopist: Mauri Pole , MD Age: 67 Referring MD:  Date of Birth: 1951-02-24 Gender: Female Account #: [id number] Procedure:                Colonoscopy Indications:              Colon cancer screening in patient at increased                            risk: Colorectal cancer in mother, High risk colon                            cancer surveillance: Personal history of adenoma                            (10 mm or greater in size), Last colonoscopy: 2015 Medicines:                Monitored Anesthesia Care Procedure:                Pre-Anesthesia Assessment:                           - Prior to the procedure, a History and Physical                            was performed, and patient medications and                            allergies were reviewed. The patient's tolerance of                            previous anesthesia was also reviewed. The risks                            and benefits of the procedure and the sedation                            options and risks were discussed with the patient.                            All questions were answered, and informed consent                            was obtained. Prior Anticoagulants: The patient has                            taken no previous anticoagulant or antiplatelet                            agents. ASA Grade Assessment: II - A patient with                            mild systemic disease. After reviewing the risks  and benefits, the patient was deemed in                            satisfactory condition to undergo the procedure.                           After obtaining informed consent, the colonoscope                            was passed under direct vision. Throughout the                            procedure, the patient's blood pressure, pulse, and                            oxygen  saturations were monitored continuously. The                            Model PCF-H190DL [id number]) scope was introduced                            through the anus and advanced to the the cecum,                            identified by appendiceal orifice and ileocecal                            valve. The colonoscopy was performed without                            difficulty. The patient tolerated the procedure                            well. The quality of the bowel preparation was                            adequate. The ileocecal valve, appendiceal orifice,                            and rectum were photographed. Scope In: 8:51:45 AM Scope Out: 9:20:06 AM Scope Withdrawal Time: 0 hours 22 minutes 24 seconds  Total Procedure Duration: 0 hours 28 minutes 21 seconds  Findings:                 The perianal and digital rectal examinations were                            normal.                           A 12 mm polyp was found in the transverse colon.                            The polyp was flat. Preparations were made for  mucosal resection. Saline 1cc was injected to raise                            the lesion. Snare mucosal resection with piecemeal                            polypectomy was performed. Resection and retrieval                            were complete.                           Multiple small and large-mouthed diverticula were                            found in the sigmoid colon, descending colon,                            transverse colon and ascending colon. There was                            narrowing of the colon in association with the                            diverticular opening. There was evidence of                            diverticular spasm. Peri-diverticular erythema was                            seen. There was evidence of an impacted                            diverticulum. Petechia were visualized in                             association with the diverticular opening.                           Non-bleeding internal hemorrhoids were found during                            retroflexion. The hemorrhoids were small. Complications:            No immediate complications. Estimated Blood Loss:     Estimated blood loss was minimal. Impression:               - One 12 mm polyp in the transverse colon, removed                            with mucosal resection. Resected and retrieved.                           - Severe diverticulosis in the sigmoid colon, in  the descending colon, in the transverse colon and                            in the ascending colon. There was narrowing of the                            colon in association with the diverticular opening.                            There was evidence of diverticular spasm.                            Peri-diverticular erythema was seen. There was                            evidence of an impacted diverticulum. Petechia were                            visualized in association with the diverticular                            opening.                           - Non-bleeding internal hemorrhoids.                           - Mucosal resection was performed. Resection and                            retrieval were complete. Recommendation:           - Patient has a contact number available for                            emergencies. The signs and symptoms of potential                            delayed complications were discussed with the                            patient. Return to normal activities tomorrow.                            Written discharge instructions were provided to the                            patient.                           - Resume previous diet.                           - Continue present medications.                           -  Await pathology results.                           - Repeat colonoscopy in 1  year for surveillance                            after piecemeal polypectomy. Mauri Pole, MD 09/19/2017 9:25:05 AM This report has been signed electronically.

## 2017-09-19 NOTE — Progress Notes (Signed)
Called to room to assist during endoscopic procedure.  Patient ID and intended procedure confirmed with present staff. Received instructions for my participation in the procedure from the performing physician.  

## 2017-09-19 NOTE — Progress Notes (Signed)
To recovery, report to RN, VSS. 

## 2017-09-19 NOTE — Patient Instructions (Signed)
INFORMATION ON POLYPS GIVEN.   YOU HAD AN ENDOSCOPIC PROCEDURE TODAY AT THE [facility]:   Refer to the procedure report that was given to you for any specific questions about what was found during the examination.  If the procedure report does not answer your questions, please call your gastroenterologist to clarify.  If you requested that your care partner not be given the details of your procedure findings, then the procedure report has been included in a sealed envelope for you to review at your convenience later.  YOU SHOULD EXPECT: Some feelings of bloating in the abdomen. Passage of more gas than usual.  Walking can help get rid of the air that was put into your GI tract during the procedure and reduce the bloating. If you had a lower endoscopy (such as a colonoscopy or flexible sigmoidoscopy) you may notice spotting of blood in your stool or on the toilet paper. If you underwent a bowel prep for your procedure, you may not have a normal bowel movement for a few days.  Please Note:  You might notice some irritation and congestion in your nose or some drainage.  This is from the oxygen used during your procedure.  There is no need for concern and it should clear up in a day or so.  SYMPTOMS TO REPORT IMMEDIATELY:   Following lower endoscopy (colonoscopy or flexible sigmoidoscopy):  Excessive amounts of blood in the stool  Significant tenderness or worsening of abdominal pains  Swelling of the abdomen that is new, acute  Fever of 100F or higher    For urgent or emergent issues, a gastroenterologist can be reached at any hour by calling [phone].   DIET:  We do recommend a small meal at first, but then you may proceed to your regular diet.  Drink plenty of fluids but you should avoid alcoholic beverages for 24 hours.  ACTIVITY:  You should plan to take it easy for the rest of today and you should NOT DRIVE or use heavy machinery until tomorrow (because of the sedation  medicines used during the test).    FOLLOW UP: Our staff will call the number listed on your records the next business day following your procedure to check on you and address any questions or concerns that you may have regarding the information given to you following your procedure. If we do not reach you, we will leave a message.  However, if you are feeling well and you are not experiencing any problems, there is no need to return our call.  We will assume that you have returned to your regular daily activities without incident.  If any biopsies were taken you will be contacted by phone or by letter within the next 1-3 weeks.  Please call us at [phone] if you have not heard about the biopsies in 3 weeks.    SIGNATURES/CONFIDENTIALITY: You and/or your care partner have signed paperwork which will be entered into your electronic medical record.  These signatures attest to the fact that that the information above on your After Visit Summary has been reviewed and is understood.  Full responsibility of the confidentiality of this discharge information lies with you and/or your care-partner. 

## 2017-09-19 NOTE — Progress Notes (Signed)
Pt. Reports an increase in her palpitations in the past few weeks she attributes to increased stress in her life.  Has started taking the Propranolol now.   (Previously prescribed but not needed).

## 2017-09-20 ENCOUNTER — Telehealth: Payer: Self-pay | Admitting: *Deleted

## 2017-09-20 NOTE — Telephone Encounter (Signed)
  Follow up Call-  Call back number 09/19/2017  Post procedure Call Back phone  # [phone]  Permission to leave phone message Yes  Some recent data might be hidden     Patient questions:  Do you have a fever, pain , or abdominal swelling? No. Pain Score  0 *  Have you tolerated food without any problems? Yes.    Have you been able to return to your normal activities? Yes.    Do you have any questions about your discharge instructions: Diet   No. Medications  No. Follow up visit  No.  Do you have questions or concerns about your Care? Yes.    Actions: * If pain score is 4 or above: No action needed, pain <4.

## 2017-09-29 DIAGNOSIS — F4323 Adjustment disorder with mixed anxiety and depressed mood: Secondary | ICD-10-CM | POA: Diagnosis not present

## 2017-10-04 DIAGNOSIS — F4323 Adjustment disorder with mixed anxiety and depressed mood: Secondary | ICD-10-CM | POA: Diagnosis not present

## 2017-10-10 DIAGNOSIS — F4323 Adjustment disorder with mixed anxiety and depressed mood: Secondary | ICD-10-CM | POA: Diagnosis not present

## 2017-10-18 ENCOUNTER — Other Ambulatory Visit: Payer: Self-pay | Admitting: Internal Medicine

## 2017-10-19 NOTE — Telephone Encounter (Signed)
Control database checked last refill: 09/04/2017 LOV: 02/25/2017

## 2017-11-15 ENCOUNTER — Other Ambulatory Visit: Payer: Self-pay | Admitting: Cardiovascular Disease

## 2017-11-15 MED ORDER — PROPRANOLOL HCL 10 MG PO TABS: 10.0000 mg | ORAL_TABLET | Freq: Every day | ORAL | 2 refills | Status: DC | PRN

## 2017-11-16 DIAGNOSIS — F4323 Adjustment disorder with mixed anxiety and depressed mood: Secondary | ICD-10-CM | POA: Diagnosis not present

## 2017-11-23 DIAGNOSIS — F4323 Adjustment disorder with mixed anxiety and depressed mood: Secondary | ICD-10-CM | POA: Diagnosis not present

## 2018-01-02 ENCOUNTER — Encounter: Payer: Self-pay | Admitting: Internal Medicine

## 2018-01-02 ENCOUNTER — Ambulatory Visit: Payer: BLUE CROSS/BLUE SHIELD | Admitting: Internal Medicine

## 2018-01-02 ENCOUNTER — Other Ambulatory Visit (INDEPENDENT_AMBULATORY_CARE_PROVIDER_SITE_OTHER): Payer: BLUE CROSS/BLUE SHIELD

## 2018-01-02 VITALS — BP 126/82 | HR 91 | Temp 97.9°F | Ht 63.0 in | Wt 162.0 lb

## 2018-01-02 DIAGNOSIS — J3089 Other allergic rhinitis: Secondary | ICD-10-CM | POA: Diagnosis not present

## 2018-01-02 DIAGNOSIS — R5383 Other fatigue: Secondary | ICD-10-CM | POA: Insufficient documentation

## 2018-01-02 DIAGNOSIS — R635 Abnormal weight gain: Secondary | ICD-10-CM | POA: Insufficient documentation

## 2018-01-02 DIAGNOSIS — J309 Allergic rhinitis, unspecified: Secondary | ICD-10-CM | POA: Insufficient documentation

## 2018-01-02 LAB — LIPID PANEL
Cholesterol: 256 mg/dL — ABNORMAL HIGH (ref 0–200)
HDL: 60.4 mg/dL (ref >39.00)
NONHDL: 195.75
TRIGLYCERIDES: 246 mg/dL — AB (ref 0.0–149.0)
Total CHOL/HDL Ratio: 4
VLDL: 49.2 mg/dL — ABNORMAL HIGH (ref 0.0–40.0)

## 2018-01-02 LAB — CBC
HCT: 40.4 % (ref 36.0–46.0)
HEMOGLOBIN: 13.6 g/dL (ref 12.0–15.0)
MCHC: 33.8 g/dL (ref 30.0–36.0)
MCV: 87.7 fl (ref 78.0–100.0)
Platelets: 250 10*3/uL (ref 150.0–400.0)
RBC: 4.6 Mil/uL (ref 3.87–5.11)
RDW: 14.2 % (ref 11.5–15.5)
WBC: 5.6 10*3/uL (ref 4.0–10.5)

## 2018-01-02 LAB — COMPREHENSIVE METABOLIC PANEL
ALT: 13 U/L (ref 0–35)
AST: 15 U/L (ref 0–37)
Albumin: 4.2 g/dL (ref 3.5–5.2)
Alkaline Phosphatase: 57 U/L (ref 39–117)
BILIRUBIN TOTAL: 0.3 mg/dL (ref 0.2–1.2)
BUN: 19 mg/dL (ref 6–23)
CALCIUM: 9.4 mg/dL (ref 8.4–10.5)
CHLORIDE: 102 meq/L (ref 96–112)
CO2: 28 meq/L (ref 19–32)
CREATININE: 0.96 mg/dL (ref 0.40–1.20)
GFR: 61.96 mL/min (ref >60.00)
GLUCOSE: 101 mg/dL — AB (ref 70–99)
Potassium: 4 mEq/L (ref 3.5–5.1)
Sodium: 138 mEq/L (ref 135–145)
Total Protein: 7.3 g/dL (ref 6.0–8.3)

## 2018-01-02 LAB — LDL CHOLESTEROL, DIRECT: Direct LDL: 168 mg/dL

## 2018-01-02 LAB — VITAMIN D 25 HYDROXY (VIT D DEFICIENCY, FRACTURES): VITD: 13.99 ng/mL — ABNORMAL LOW (ref 30.00–100.00)

## 2018-01-02 LAB — BRAIN NATRIURETIC PEPTIDE: PRO B NATRI PEPTIDE: 16 pg/mL (ref 0.0–100.0)

## 2018-01-02 LAB — VITAMIN B12: Vitamin B-12: 470 pg/mL (ref 211–911)

## 2018-01-02 LAB — TSH: TSH: 1.19 u[IU]/mL (ref 0.35–4.50)

## 2018-01-02 LAB — HEMOGLOBIN A1C: HEMOGLOBIN A1C: 6.4 % (ref 4.6–6.5)

## 2018-01-02 MED ORDER — MONTELUKAST SODIUM 10 MG PO TABS: 10.0000 mg | ORAL_TABLET | Freq: Every day | ORAL | 3 refills | Status: DC

## 2018-01-02 NOTE — Assessment & Plan Note (Signed)
Rx for singulair to try for allergy symptoms.

## 2018-01-02 NOTE — Assessment & Plan Note (Signed)
Unclear etiology. She is eating sweets quite often. Checking HgA1c to rule out diabetes. Checking thyroid, CMP, CBC, BNP to rule out other etiology. She does not take many medicines regularly. We talked about exercise as therapeutic for her mental health and helpful for her weight.

## 2018-01-02 NOTE — Progress Notes (Signed)
   Subjective:    Patient ID: Jean Smith, female    DOB: [birth year], 67 y.o.   MRN: [id number]  HPI The patient is a 67 YO female coming in for many concerns including allergies (recent flare with cleaning out her mother's house, some cockroaches and dust etc, she got swelling afterwards and hard time breathing, denies fevers or chills, some drainage and yellow drainage, started about 1 week ago, denies ear pain or drainage) and fatigue (tired all the time, achy all the time, not wanting to do anything, weight is up, has chronic stress and more with her children causing problems with alcohol use and the other one just moved back in with them) and weight gain (up about 20 pounds or so in the last several years, is having a lot of change in appetite, is craving and eating a lot of sweets, is worried about having diabetes)  Review of Systems  Constitutional: Positive for activity change, appetite change and fatigue. Negative for chills, fever and unexpected weight change.  HENT: Positive for postnasal drip and rhinorrhea. Negative for congestion, ear discharge, ear pain, sinus pressure, sinus pain, sneezing, sore throat, tinnitus, trouble swallowing and voice change.   Eyes: Negative.   Respiratory: Negative for cough, chest tightness, shortness of breath and wheezing.   Cardiovascular: Negative.   Gastrointestinal: Negative.   Endocrine: Positive for polyuria.  Musculoskeletal: Positive for arthralgias, joint swelling and myalgias.  Neurological: Negative.   Psychiatric/Behavioral: Positive for decreased concentration and dysphoric mood. The patient is nervous/anxious.       Objective:   Physical Exam  Constitutional: She is oriented to person, place, and time. She appears well-developed and well-nourished.  overweight  HENT:  Head: Normocephalic and atraumatic.  TMs normal, oropharynx with redness and nasal turbinates red and swollen.   Eyes: EOM are normal.  Neck: Normal range of  motion.  Cardiovascular: Normal rate and regular rhythm.  Pulmonary/Chest: Effort normal and breath sounds normal. No respiratory distress. She has no wheezes. She has no rales.  Abdominal: Soft. Bowel sounds are normal. She exhibits no distension. There is no tenderness. There is no rebound.  Musculoskeletal: She exhibits tenderness. She exhibits no edema.  Neurological: She is alert and oriented to person, place, and time. Coordination normal.  Skin: Skin is warm and dry.  Psychiatric:  Some distress during the visit   Vitals:   01/02/18 1422  BP: 126/82  Pulse: 91  Temp: 97.9 F (36.6 C)  TempSrc: Oral  SpO2: 98%  Weight: 162 lb (73.5 kg)  Height: 5\' 3"  (1.6 m)      Assessment & Plan:

## 2018-01-02 NOTE — Patient Instructions (Addendum)
We will check the labs today and call you back about the results.   We have sent in singulair to take 1 pill daily.

## 2018-01-02 NOTE — Assessment & Plan Note (Signed)
Checking thyroid, HgA1c, CBC, CMP, vitamin levels, BNP for any cause of the symptoms. Could be caused by the weight gain.

## 2018-01-03 ENCOUNTER — Other Ambulatory Visit: Payer: Self-pay | Admitting: Internal Medicine

## 2018-01-03 MED ORDER — VITAMIN D (ERGOCALCIFEROL) 1.25 MG (50000 UNIT) PO CAPS: 50000.0000 [IU] | ORAL_CAPSULE | ORAL | 0 refills | Status: DC

## 2018-01-31 DIAGNOSIS — F4323 Adjustment disorder with mixed anxiety and depressed mood: Secondary | ICD-10-CM | POA: Diagnosis not present

## 2018-02-07 DIAGNOSIS — F4323 Adjustment disorder with mixed anxiety and depressed mood: Secondary | ICD-10-CM | POA: Diagnosis not present

## 2018-02-14 DIAGNOSIS — F4323 Adjustment disorder with mixed anxiety and depressed mood: Secondary | ICD-10-CM | POA: Diagnosis not present

## 2018-02-15 ENCOUNTER — Other Ambulatory Visit: Payer: Self-pay | Admitting: Cardiovascular Disease

## 2018-02-20 DIAGNOSIS — F4323 Adjustment disorder with mixed anxiety and depressed mood: Secondary | ICD-10-CM | POA: Diagnosis not present

## 2018-02-27 ENCOUNTER — Encounter: Payer: Self-pay | Admitting: Internal Medicine

## 2018-02-27 ENCOUNTER — Ambulatory Visit (INDEPENDENT_AMBULATORY_CARE_PROVIDER_SITE_OTHER): Payer: BLUE CROSS/BLUE SHIELD | Admitting: Internal Medicine

## 2018-02-27 VITALS — BP 120/80 | HR 80 | Temp 98.4°F | Ht 63.0 in | Wt 165.0 lb

## 2018-02-27 DIAGNOSIS — Z0000 Encounter for general adult medical examination without abnormal findings: Secondary | ICD-10-CM | POA: Diagnosis not present

## 2018-02-27 DIAGNOSIS — E559 Vitamin D deficiency, unspecified: Secondary | ICD-10-CM

## 2018-02-27 DIAGNOSIS — F411 Generalized anxiety disorder: Secondary | ICD-10-CM

## 2018-02-27 DIAGNOSIS — Z23 Encounter for immunization: Secondary | ICD-10-CM | POA: Diagnosis not present

## 2018-02-27 DIAGNOSIS — R5383 Other fatigue: Secondary | ICD-10-CM

## 2018-02-27 NOTE — Patient Instructions (Signed)

## 2018-02-27 NOTE — Progress Notes (Signed)
   Subjective:    Patient ID: Jean Smith, female    DOB: [birth year], 67 y.o.   MRN: [id number]  HPI The patient is a 67 YO female coming in for physical. Does have concerns which are not new. She is having an increasing amount of stress over her alcoholic son who is not ready to get help or treatment. She is not caring for herself and is not eating well and not exercising lately.   PMH, [facility], social history reviewed and updated.   Review of Systems  Constitutional: Positive for activity change, appetite change, fatigue and unexpected weight change.  HENT: Negative.   Eyes: Negative.   Respiratory: Positive for shortness of breath. Negative for cough and chest tightness.   Cardiovascular: Positive for palpitations. Negative for chest pain and leg swelling.  Gastrointestinal: Negative for abdominal distention, abdominal pain, constipation, diarrhea, nausea and vomiting.  Musculoskeletal: Positive for arthralgias and myalgias.  Skin: Negative.   Neurological: Negative.   Psychiatric/Behavioral: Positive for dysphoric mood and sleep disturbance. The patient is nervous/anxious.       Objective:   Physical Exam  Constitutional: She is oriented to person, place, and time. She appears well-developed and well-nourished.  HENT:  Head: Normocephalic and atraumatic.  Eyes: EOM are normal.  Neck: Normal range of motion.  Cardiovascular: Normal rate and regular rhythm.  Pulmonary/Chest: Effort normal and breath sounds normal. No respiratory distress. She has no wheezes. She has no rales.  Abdominal: Soft. Bowel sounds are normal. She exhibits no distension. There is no tenderness. There is no rebound.  Musculoskeletal: She exhibits no edema.  Neurological: She is alert and oriented to person, place, and time. Coordination normal.  Skin: Skin is warm and dry.  Psychiatric:  Spends most of her visit talking about her son and diverting any questions about her health to her family's situation    Vitals:   02/27/18 1025  BP: 120/80  Pulse: 80  Temp: 98.4 F (36.9 C)  TempSrc: Oral  SpO2: 97%  Weight: 165 lb (74.8 kg)  Height: 5\' 3"  (1.6 m)      Assessment & Plan:  Flu and prevnar 13 given at visit

## 2018-02-28 DIAGNOSIS — F4323 Adjustment disorder with mixed anxiety and depressed mood: Secondary | ICD-10-CM | POA: Diagnosis not present

## 2018-02-28 NOTE — Assessment & Plan Note (Signed)
Labs were normal except vitamin D which we will recheck today. Suspect stress and poor sleep are big contributors to feeling poorly.

## 2018-02-28 NOTE — Assessment & Plan Note (Signed)
She does have ongoing stress and seeing therapist. She is not able to set any boundaries with her son at this time. She is using xanax up to TID prn and we talked about long term risks of this medication. She accepts that and wishes to continue at this time.

## 2018-02-28 NOTE — Assessment & Plan Note (Signed)
Flu shot given. Pneumonia prevnar 13 given today. Shingrix counseled. Tetanus up to date. Colonoscopy up to date. Mammogram up to date, pap smear not up to date and dexa up to date. Counseled about sun safety and mole surveillance. Counseled about the dangers of distracted driving. Given 10 year screening recommendations.

## 2018-03-06 DIAGNOSIS — L603 Nail dystrophy: Secondary | ICD-10-CM | POA: Diagnosis not present

## 2018-03-06 DIAGNOSIS — L905 Scar conditions and fibrosis of skin: Secondary | ICD-10-CM | POA: Diagnosis not present

## 2018-03-06 DIAGNOSIS — F4323 Adjustment disorder with mixed anxiety and depressed mood: Secondary | ICD-10-CM | POA: Diagnosis not present

## 2018-03-09 ENCOUNTER — Ambulatory Visit: Payer: BLUE CROSS/BLUE SHIELD | Admitting: Obstetrics & Gynecology

## 2018-03-09 ENCOUNTER — Other Ambulatory Visit (HOSPITAL_COMMUNITY)
Admission: RE | Admit: 2018-03-09 | Discharge: 2018-03-09 | Disposition: A | Discharge status: Home / Self Care | Payer: BLUE CROSS/BLUE SHIELD | Source: Ambulatory Visit | Attending: Obstetrics & Gynecology | Admitting: Obstetrics & Gynecology

## 2018-03-09 ENCOUNTER — Other Ambulatory Visit: Payer: Self-pay

## 2018-03-09 ENCOUNTER — Encounter: Payer: Self-pay | Admitting: Obstetrics & Gynecology

## 2018-03-09 VITALS — BP 122/76 | HR 76 | Resp 18 | Ht 63.5 in | Wt 164.2 lb

## 2018-03-09 DIAGNOSIS — Z205 Contact with and (suspected) exposure to viral hepatitis: Secondary | ICD-10-CM

## 2018-03-09 DIAGNOSIS — Z124 Encounter for screening for malignant neoplasm of cervix: Secondary | ICD-10-CM

## 2018-03-09 DIAGNOSIS — Z01419 Encounter for gynecological examination (general) (routine) without abnormal findings: Secondary | ICD-10-CM

## 2018-03-09 DIAGNOSIS — Z0000 Encounter for general adult medical examination without abnormal findings: Secondary | ICD-10-CM

## 2018-03-09 NOTE — Progress Notes (Signed)
68 y.o. [id number] Married White or Caucasian female here for annual exam.  Has been several years since she's any gyn care.  Referred from Dr. Sharlet Salina.  Has not had vaginal bleeding in about 10 years.  Was never on HRT.    Oldest son is on the autism spectrum and he has a substance abuse problem.    Is not SA and has not been for many years.  Has struggled emotionally with this.  Had a termination in her early 50's.  Was very angry with her husband   She has been seeing [employer].  She had to take a break when [place] had surgery.  This is a good relationship for her.  Has recently put her mother in [place].  This was stressful as she had to force her mother to know.     No LMP recorded. Patient is postmenopausal.          Sexually active: No.  The current method of family planning is post menopausal status.    Exercising: No.   Smoker:  no  Health Maintenance: Pap:  5 years ago  History of abnormal Pap:  no MMG:  02/01/17 BIRADS1:neg  Colonoscopy:  09/19/17 polyps. F/u 1 years  BMD:   02/26/15 Osteopenia  TDaP:  2016 Pneumonia vaccine(s):  2019 Shingrix:   No Hep C testing: 08/27/14 neg  Screening Labs: PCP   reports that she quit smoking about 34 years ago. Her smoking use included cigarettes. She has never used smokeless tobacco. She reports that she drinks alcohol. She reports that she does not use drugs.  Past Medical History:  Diagnosis Date  . Allergy   . Anxiety   . Cataract    bilateral - MD is just watching   . Depression   . Diverticulosis   . Fuchs' endothelial dystrophy    follows with optho regularly   . Gestational diabetes   . HSV-2 infection   . Hyperlipidemia    no meds - diet controlled  . Hyperplastic colon polyp   . Mitral valve prolapse   . PVC's (premature ventricular contractions)    intol of BB, sxc palpitations r/t stress  . RVOT ventricular tachycardia/PVCs    EP eval 01/2013 for freq PVCs    Past Surgical History:  Procedure Laterality  Date  . CESAREAN SECTION     x 3  . KNEE SURGERY Right   . MANDIBLE SURGERY     right side in front of ear  . WISDOM TOOTH EXTRACTION      Current Outpatient Medications  Medication Sig Dispense Refill  . ALPRAZolam (XANAX) 0.25 MG tablet TAKE 1 TABLET BY MOUTH 3 TIMES A DAY 90 tablet 3  . CALCIUM PO Take by mouth daily.    Marland Kitchen loratadine (CLARITIN) 10 MG tablet Take 10 mg by mouth daily as needed for allergies.     . Magnesium Bisglycinate Dihyd POWD Take 1 tablet by mouth 2 (two) times daily.     . Multiple Vitamin (MULTIVITAMIN) tablet Take 1 tablet by mouth daily.    . propranolol (INDERAL) 10 MG tablet TAKE 1 TABLET BY MOUTH EVERY DAY AS NEEDED FOR PALPITATIONS 30 tablet 0  . Vitamin D, Ergocalciferol, (DRISDOL) 50000 units CAPS capsule Take 1 capsule (50,000 Units total) by mouth every 7 (seven) days. 12 capsule 0   No current facility-administered medications for this visit.     Family History  Problem Relation Age of Onset  . Colon cancer Mother   .  Heart disease Father   . Colon cancer Maternal Grandmother   . Colon polyps Sister   . Alcohol abuse Unknown   . Rectal cancer Neg Hx   . Stomach cancer Neg Hx     Review of Systems  All other systems reviewed and are negative.   Exam:   BP 122/76 (BP Location: Right Arm, Patient Position: Sitting, Cuff Size: Large)   Pulse 76   Resp 18   Ht 5' 3.5" (1.613 m)   Wt 164 lb 3.2 oz (74.5 kg)   BMI 28.63 kg/m    Height: 5' 3.5" (161.3 cm)  Ht Readings from Last 3 Encounters:  03/09/18 5' 3.5" (1.613 m)  02/27/18 5\' 3"  (1.6 m)  01/02/18 5\' 3"  (1.6 m)    General appearance: alert, cooperative and appears stated age Head: Normocephalic, without obvious abnormality, atraumatic Neck: no adenopathy, supple, symmetrical, trachea midline and thyroid normal to inspection and palpation Lungs: clear to auscultation bilaterally Breasts: normal appearance, no masses or tenderness Heart: regular rate and rhythm Abdomen: soft,  non-tender; bowel sounds normal; no masses,  no organomegaly Extremities: extremities normal, atraumatic, no cyanosis or edema Skin: Skin color, texture, turgor normal. No rashes or lesions Lymph nodes: Cervical, supraclavicular, and axillary nodes normal. No abnormal inguinal nodes palpated Neurologic: Grossly normal   Pelvic: External genitalia:  no lesions              Urethra:  normal appearing urethra with no masses, tenderness or lesions              Bartholins and Skenes: normal                 Vagina: normal appearing vagina with normal color and discharge, no lesions              Cervix: no lesions              Pap taken: Yes.   Bimanual Exam:  Uterus:  normal size, contour, position, consistency, mobility, non-tender              Adnexa: normal adnexa and no mass, fullness, tenderness               Rectovaginal: Confirms               Anus:  normal sphincter tone, no lesions  Chaperone was present for exam.  A:  Well Woman with normal exam PMP, no HRT H/O depression H/o HSV 2 H/O cesarean section x 3 Possible hep c exposure Elevated lipids  P:   Mammogram guidelines reviewed.  Doin gyearl pap smear and HR HPV obtained today Colonoscopy due in one year Hep C antibody will be obtained.  She declines having HIV obtained. return annually or prn

## 2018-03-10 LAB — HEPATITIS C ANTIBODY: Hep C Virus Ab: <0.1 s/co ratio (ref 0.0–0.9)

## 2018-03-13 LAB — CYTOLOGY - PAP
Diagnosis: NEGATIVE
HPV: NOT DETECTED

## 2018-03-14 DIAGNOSIS — F4323 Adjustment disorder with mixed anxiety and depressed mood: Secondary | ICD-10-CM | POA: Diagnosis not present

## 2018-03-20 ENCOUNTER — Other Ambulatory Visit: Payer: Self-pay | Admitting: Cardiovascular Disease

## 2018-03-22 DIAGNOSIS — F4323 Adjustment disorder with mixed anxiety and depressed mood: Secondary | ICD-10-CM | POA: Diagnosis not present

## 2018-03-22 DIAGNOSIS — H1851 Endothelial corneal dystrophy: Secondary | ICD-10-CM | POA: Diagnosis not present

## 2018-03-22 DIAGNOSIS — H35033 Hypertensive retinopathy, bilateral: Secondary | ICD-10-CM | POA: Diagnosis not present

## 2018-03-22 DIAGNOSIS — H25013 Cortical age-related cataract, bilateral: Secondary | ICD-10-CM | POA: Diagnosis not present

## 2018-03-22 DIAGNOSIS — H2513 Age-related nuclear cataract, bilateral: Secondary | ICD-10-CM | POA: Diagnosis not present

## 2018-03-28 DIAGNOSIS — F4323 Adjustment disorder with mixed anxiety and depressed mood: Secondary | ICD-10-CM | POA: Diagnosis not present

## 2018-03-29 ENCOUNTER — Other Ambulatory Visit: Payer: Self-pay | Admitting: Internal Medicine

## 2018-04-10 DIAGNOSIS — F4323 Adjustment disorder with mixed anxiety and depressed mood: Secondary | ICD-10-CM | POA: Diagnosis not present

## 2018-04-13 ENCOUNTER — Other Ambulatory Visit: Payer: Self-pay | Admitting: Cardiovascular Disease

## 2018-04-17 DIAGNOSIS — F4323 Adjustment disorder with mixed anxiety and depressed mood: Secondary | ICD-10-CM | POA: Diagnosis not present

## 2018-04-25 DIAGNOSIS — F4323 Adjustment disorder with mixed anxiety and depressed mood: Secondary | ICD-10-CM | POA: Diagnosis not present

## 2018-05-02 DIAGNOSIS — F4323 Adjustment disorder with mixed anxiety and depressed mood: Secondary | ICD-10-CM | POA: Diagnosis not present

## 2018-05-04 ENCOUNTER — Other Ambulatory Visit (INDEPENDENT_AMBULATORY_CARE_PROVIDER_SITE_OTHER): Payer: BLUE CROSS/BLUE SHIELD

## 2018-05-04 ENCOUNTER — Encounter: Payer: Self-pay | Admitting: Internal Medicine

## 2018-05-04 ENCOUNTER — Ambulatory Visit: Payer: BLUE CROSS/BLUE SHIELD | Admitting: Internal Medicine

## 2018-05-04 VITALS — BP 130/80 | HR 101 | Temp 97.5°F | Ht 63.5 in | Wt 167.0 lb

## 2018-05-04 DIAGNOSIS — M25511 Pain in right shoulder: Secondary | ICD-10-CM

## 2018-05-04 DIAGNOSIS — G8929 Other chronic pain: Secondary | ICD-10-CM | POA: Diagnosis not present

## 2018-05-04 DIAGNOSIS — E559 Vitamin D deficiency, unspecified: Secondary | ICD-10-CM | POA: Diagnosis not present

## 2018-05-04 DIAGNOSIS — R3915 Urgency of urination: Secondary | ICD-10-CM

## 2018-05-04 LAB — POCT URINALYSIS DIPSTICK
Bilirubin, UA: NEGATIVE
Blood, UA: NEGATIVE
Glucose, UA: NEGATIVE
Ketones, UA: NEGATIVE
LEUKOCYTES UA: NEGATIVE
NITRITE UA: NEGATIVE
PROTEIN UA: NEGATIVE
Spec Grav, UA: <=1.005 — AB (ref 1.010–1.025)
Urobilinogen, UA: 0.2 E.U./dL
pH, UA: 6 (ref 5.0–8.0)

## 2018-05-04 LAB — COMPREHENSIVE METABOLIC PANEL
ALK PHOS: 55 U/L (ref 39–117)
ALT: 14 U/L (ref 0–35)
AST: 17 U/L (ref 0–37)
Albumin: 4.3 g/dL (ref 3.5–5.2)
BUN: 17 mg/dL (ref 6–23)
CO2: 30 meq/L (ref 19–32)
Calcium: 9.5 mg/dL (ref 8.4–10.5)
Chloride: 102 mEq/L (ref 96–112)
Creatinine, Ser: 0.95 mg/dL (ref 0.40–1.20)
GFR: 62.64 mL/min (ref >60.00)
GLUCOSE: 95 mg/dL (ref 70–99)
POTASSIUM: 4.3 meq/L (ref 3.5–5.1)
SODIUM: 140 meq/L (ref 135–145)
TOTAL PROTEIN: 7.2 g/dL (ref 6.0–8.3)
Total Bilirubin: 0.4 mg/dL (ref 0.2–1.2)

## 2018-05-04 LAB — VITAMIN D 25 HYDROXY (VIT D DEFICIENCY, FRACTURES): VITD: 25.59 ng/mL — AB (ref 30.00–100.00)

## 2018-05-04 NOTE — Progress Notes (Signed)
   Subjective:    Patient ID: Jean Smith, female    DOB: 1951-05-04, 67 y.o.   MRN: [id number]  HPI The patient is a 67 YO female coming in for right shoulder pain (going on for months, she thought that it would improve, she does not want surgery on it, taking otc medications and they help some, she has been working with ROM and can do more slowly without pain but still hurting, the left is starting to hurt some due to doing more since right is hurting, denies injury or overuse at onset, did happen when she was helping to move several family members, denies old injury) and urinary frequency (concerned about UTI, denies knowing if she is drinking more fluids, feels off sometimes when she pees, denies overt pain, denies pain in back or side, denies nausea or vomiting, denies fevers or chills) and vitamin D deficiency (just finished high dose replacement, not feeling any different, denies new numbness or weakness).   Review of Systems  Constitutional: Positive for activity change. Negative for appetite change, chills, fatigue, fever and unexpected weight change.  HENT: Negative.   Eyes: Negative.   Respiratory: Negative.   Cardiovascular: Negative.   Gastrointestinal: Negative.  Negative for abdominal distention, constipation, diarrhea, nausea and vomiting.  Genitourinary: Positive for frequency and urgency. Negative for difficulty urinating and dysuria.  Musculoskeletal: Positive for arthralgias and myalgias. Negative for back pain, gait problem, joint swelling, neck pain and neck stiffness.  Skin: Negative.   Neurological: Negative.   Psychiatric/Behavioral: Positive for decreased concentration, dysphoric mood and sleep disturbance. The patient is nervous/anxious.       Objective:   Physical Exam  Constitutional: She is oriented to person, place, and time. She appears well-developed and well-nourished.  HENT:  Head: Normocephalic and atraumatic.  Eyes: EOM are normal.  Neck: Normal  range of motion.  Cardiovascular: Normal rate and regular rhythm.  Pulmonary/Chest: Effort normal and breath sounds normal. No respiratory distress. She has no wheezes. She has no rales.  Abdominal: Soft. Bowel sounds are normal. She exhibits no distension. There is no tenderness. There is no rebound.  Musculoskeletal: She exhibits tenderness. She exhibits no edema.  Pain in the biceps region superiorly with ROM of the shoulder, limited ROM due to pain  Neurological: She is alert and oriented to person, place, and time. Coordination normal.  Skin: Skin is warm and dry.  Psychiatric:  Anxious affect   Vitals:   05/04/18 1358  BP: 130/80  Pulse: (!) 101  Temp: (!) 97.5 F (36.4 C)  TempSrc: Oral  SpO2: 97%  Weight: 167 lb (75.8 kg)  Height: 5' 3.5" (1.613 m)      Assessment & Plan:

## 2018-05-04 NOTE — Patient Instructions (Addendum)
You do not have a urinary infection today.   We will get you into physical therapy for the shoulder.

## 2018-05-05 DIAGNOSIS — G8929 Other chronic pain: Secondary | ICD-10-CM | POA: Insufficient documentation

## 2018-05-05 DIAGNOSIS — E559 Vitamin D deficiency, unspecified: Secondary | ICD-10-CM | POA: Insufficient documentation

## 2018-05-05 DIAGNOSIS — M25511 Pain in right shoulder: Secondary | ICD-10-CM

## 2018-05-05 DIAGNOSIS — R3915 Urgency of urination: Secondary | ICD-10-CM | POA: Insufficient documentation

## 2018-05-05 NOTE — Assessment & Plan Note (Signed)
Recheck vitamin D as she has just finished the high dose replacement. Adjust as needed.

## 2018-05-05 NOTE — Assessment & Plan Note (Signed)
Refer to PT for ROM and if unable to make progress would recommend sports medicine as there could be defect in the biceps tendon given pain there with ROM.

## 2018-05-05 NOTE — Assessment & Plan Note (Signed)
POC U/A in the office without signs of infection so no antibiotic is given. Advised to monitor intake and frequency.

## 2018-05-18 DIAGNOSIS — F4323 Adjustment disorder with mixed anxiety and depressed mood: Secondary | ICD-10-CM | POA: Diagnosis not present

## 2018-05-19 ENCOUNTER — Ambulatory Visit: Payer: BLUE CROSS/BLUE SHIELD | Attending: Internal Medicine | Admitting: Physical Therapy

## 2018-05-19 ENCOUNTER — Encounter: Payer: Self-pay | Admitting: Physical Therapy

## 2018-05-19 ENCOUNTER — Other Ambulatory Visit: Payer: Self-pay

## 2018-05-19 DIAGNOSIS — G8929 Other chronic pain: Secondary | ICD-10-CM | POA: Insufficient documentation

## 2018-05-19 DIAGNOSIS — M25611 Stiffness of right shoulder, not elsewhere classified: Secondary | ICD-10-CM | POA: Insufficient documentation

## 2018-05-19 DIAGNOSIS — M25512 Pain in left shoulder: Secondary | ICD-10-CM | POA: Insufficient documentation

## 2018-05-19 DIAGNOSIS — M6281 Muscle weakness (generalized): Secondary | ICD-10-CM | POA: Diagnosis not present

## 2018-05-19 DIAGNOSIS — M25511 Pain in right shoulder: Secondary | ICD-10-CM | POA: Insufficient documentation

## 2018-05-19 NOTE — Therapy (Signed)
[facility] [address], [place], [place], [address] Phone: [phone]   Fax:  [phone]  Physical Therapy Evaluation  Patient Details  Name: Jean Smith MRN: [id number] Date of Birth: December 14, 1950 Referring Provider (PT): Dr. Pricilla Holm   Encounter Date: 05/19/2018  PT End of Session - 05/19/18 1642    Visit Number  1    Date for PT Re-Evaluation  07/14/18    Authorization Type  [employer] 30 visit limit    PT Start Time  1100    PT Stop Time  1157    PT Time Calculation (min)  57 min    Activity Tolerance  Patient tolerated treatment well       Past Medical History:  Diagnosis Date  . Allergy   . Anxiety   . Cataract    bilateral - MD is just watching   . Diverticulosis   . Fuchs' endothelial dystrophy    follows with optho regularly   . Gestational diabetes   . History of depression   . HSV-2 infection   . Hyperlipidemia    no meds - diet controlled  . Hyperplastic colon polyp   . Mitral valve prolapse   . PVC's (premature ventricular contractions)    intol of BB, sxc palpitations r/t stress  . RVOT ventricular tachycardia/PVCs    EP eval 01/2013 for freq PVCs    Past Surgical History:  Procedure Laterality Date  . CESAREAN SECTION     x 3  . KNEE SURGERY Right   . MANDIBLE SURGERY     right side in front of ear  . WISDOM TOOTH EXTRACTION      There were no vitals filed for this visit.   Subjective Assessment - 05/19/18 1106    Subjective  Unsure of what caused shoulder pain started in June right > left.  Right started first.  Reaching backward and putting on jacket is painful.  Lifting tea kettle is painful.  Pulling up pants.  Moving too quickly.      Pertinent History  "Jean Smith"    Limitations  House hold activities    Diagnostic tests  none    Patient Stated Goals  keep from having rotator cuff surgery like my parents did;  not make it worse     Currently in Pain?  Yes    Pain  Score  3     Pain Location  Shoulder    Pain Orientation  Right;Left    Pain Type  Chronic pain    Pain Radiating Towards  thumbs and shoulder too    Aggravating Factors   out to the side; pullover tops, putting on a jacket, pull up pants; lifting tea kettle; getting on and off high bed; sleeping on right side    Pain Relieving Factors  ibuprofen;  CBD cream          [facility] PT Assessment - 05/19/18 0001      Assessment   Medical Diagnosis  chronic right shoulder pain     Referring Provider (PT)  Dr. Pricilla Holm    Onset Date/Surgical Date  --   June   Hand Dominance  Right    Next MD Visit  as needed    Prior Therapy  no      Precautions   Precautions  None      Restrictions   Weight Bearing Restrictions  No      Balance Screen   Has the patient  fallen in the past 6 months  No    Has the patient had a decrease in activity level because of a fear of falling?   No    Is the patient reluctant to leave their home because of a fear of falling?   No      Home Environment   Living Environment  Private residence    Home Access  Stairs to enter    Home Layout  Two level      Prior Function   Level of Independence  Independent   avoid carrying boxes   Vocation  Retired    Leisure  in past go to movies with friends but I don't do anything now      Observation/Other Assessments   Focus on Therapeutic Outcomes (FOTO)   62% limitation       Posture/Postural Control   Posture/Postural Control  Postural limitations    Postural Limitations  Rounded Shoulders;Forward head      AROM   AROM Assessment Site  --   increased right scapular elevation    Right Shoulder Flexion  145 Degrees   pain   Right Shoulder ABduction  170 Degrees    Right Shoulder Internal Rotation  --   T6 painful   Right Shoulder External Rotation  85 Degrees    Left Shoulder Flexion  156 Degrees    Left Shoulder ABduction  156 Degrees    Left Shoulder Internal Rotation  --   T4   Left Shoulder  External Rotation  85 Degrees      Strength   Right Shoulder Flexion  3+/5    Right Shoulder Extension  4-/5    Right Shoulder ABduction  3+/5    Right Shoulder Internal Rotation  3+/5    Right Shoulder External Rotation  3+/5    Left Shoulder Flexion  4-/5    Left Shoulder Extension  4-/5    Left Shoulder ABduction  3+/5    Left Shoulder Internal Rotation  4-/5    Left Shoulder External Rotation  4-/5      Palpation   Palpation comment  tender subacromial region right       Hawkins-Heitman test   Findings  Positive    Side  Right      Empty Can test   Findings  Positive    Side  Right      Lag time at 0 degrees   Findings  Positive    Side  Right      Drop Arm test   Findings  Positive    Side  Right                Objective measurements completed on examination: See above findings.      [place] [facility] PT Treatment/Exercise - 05/19/18 0001      Self-Care   Other Self-Care Comments   Self care strategies to address stressors in her life which affect pain;  initiation of pain education              PT Education - 05/19/18 1159    Education Details   Access Code: [email or web address] scapular retractions; walking program;  self care through imagery, relaxation, meditation;  avoiding "negative talk" and replacing with  positive statements    Person(s) Educated  Patient    Methods  Explanation;Demonstration;Handout    Comprehension  Returned demonstration;Verbalized understanding       PT Short Term Goals - 05/19/18 1655  PT SHORT TERM GOAL #1   Title  The patient will demonstrate compliance with initial HEP and self care strategies    Time  4    Period  Weeks    Status  New    Target Date  06/16/18      PT SHORT TERM GOAL #2   Title  The patient will have improved shoulder ROM for flexion/elevation to 150 degrees on right and 160 degrees on left needed for putting on a pullover top    Time  4    Period  Weeks    Status  New      PT SHORT TERM GOAL  #3   Title  The patient will report a 30% improvement in bilateral shoulder pain with dressing and household ADLs    Time  4    Period  Weeks    Status  New        PT Long Term Goals - 05/19/18 1659      PT LONG TERM GOAL #1   Title  The patient will be independent with safe self progression of HEP needed for further improvements in pain and function    Time  8    Period  Weeks    Status  New    Target Date  07/14/18      PT LONG TERM GOAL #2   Title  The patient will have improved right shoulder strength to grossly 4-/5 and left shoulder to 4/5 needed for lifting her tea kettle.      Time  8    Period  Weeks    Status  New      PT LONG TERM GOAL #3   Title  The patient will report a 50% improvement in overall function including dressing, household ADLs and driving    Time  8    Period  Weeks    Status  New      PT LONG TERM GOAL #4   Title  FOTO functional outcome score improved from 62% limitation to 38% limitation     Time  8    Period  Weeks    Status  New             Plan - 05/19/18 1159    Clinical Impression Statement  The patient reports bilateral shoulder pain that started in June for no apparent reason.  Pain initially began on the right then started in her left shoulder.  Her pain is aggravated with many dressing tasks, lifting her tea kettle, getting in/out of her high bed and sleeping on her side.  Her ROM is slightly limited bilaterally.  Right shoulder strength is grossly 3+/5 on right and 4-/5 on left.  + rotator cuff tests right more so than left including empty can, Michel Bickers, lag sign and drop arm test.  She has numerous stressors in her life and she reports dealing with family issues this past year.  She states she has gained a lot of weight and has no leisure activities or any type of exercise program.  These psychosocial issues most certainly affect her pain and she would benefit additional pain education.      History and Personal Factors  relevant to plan of care:  psychosocial issues; no home support; history of anxiety    Clinical Presentation  Evolving    Clinical Presentation due to:  worsening over time; multiple body regions and systems affected    Clinical Decision Making  Moderate  Rehab Potential  Good    PT Frequency  2x / week    PT Duration  8 weeks    PT Treatment/Interventions  ADLs/Self Care Home Management;Cryotherapy;Electrical Stimulation;Ultrasound;Moist Heat;Iontophoresis 4mg /ml Dexamethasone;Neuromuscular re-education;Therapeutic activities;Therapeutic exercise;Manual techniques;Taping;Dry needling    PT Next Visit Plan    iontophoresis if cert signed;  kinesiotaping; ROM; glenohumeral and scapular strengthening; pain education     PT Home Exercise Plan   Access Code: [email or web address]     Recommended Other Services  Patient may benefit from a home TENS unit    Consulted and Agree with Plan of Care  Patient       Patient will benefit from skilled therapeutic intervention in order to improve the following deficits and impairments:  Pain, Decreased range of motion, Decreased strength, Decreased activity tolerance, Impaired UE functional use  Visit Diagnosis: Chronic right shoulder pain - Plan: PT plan of care cert/re-cert  Chronic left shoulder pain - Plan: PT plan of care cert/re-cert  Stiffness of right shoulder, not elsewhere classified - Plan: PT plan of care cert/re-cert  Muscle weakness (generalized) - Plan: PT plan of care cert/re-cert     Problem List Patient Active Problem List   Diagnosis Date Noted  . Urinary urgency 05/05/2018  . Chronic right shoulder pain 05/05/2018  . Vitamin D deficiency 05/05/2018  . Other fatigue 01/02/2018  . Allergic rhinitis 01/02/2018  . Weight gain 01/02/2018  . PVC (premature ventricular contraction) 03/09/2017  . Routine general medical examination at a health care facility 09/01/2015  . Hyperlipidemia 09/01/2015  . Varicose vein 08/27/2014  . Primary  localized osteoarthrosis, lower leg 10/30/2013  . Anxiety state 03/23/2013  . 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 Depression 01/01/2013  . Abnormal stress echo 12/11/2012  . Palpitations 09/14/2010   Ruben Im, PT 05/19/18 5:10 PM Phone: [phone] Fax: [phone] Alvera Singh 05/19/2018, 5:10 PM  [facility] [address], [place], [place], [address] Phone: [phone]   Fax:  [phone]  Name: Jean Smith MRN: [id number] Date of Birth: 05/24/1951

## 2018-05-19 NOTE — Patient Instructions (Signed)
Access Code: [email or web address]  URL: [email or web address]  Date: 05/19/2018  Prepared by: Ruben Im   Exercises  Seated Scapular Retraction - 10 reps - 1 sets - 1x daily - 7x weekly      Walking   Address stressors     [facility] [address] [place], [place] [address] Phone # [phone] Fax [phone]

## 2018-05-22 ENCOUNTER — Ambulatory Visit: Payer: BLUE CROSS/BLUE SHIELD | Admitting: Physical Therapy

## 2018-05-22 ENCOUNTER — Encounter: Payer: Self-pay | Admitting: Physical Therapy

## 2018-05-22 DIAGNOSIS — M25611 Stiffness of right shoulder, not elsewhere classified: Secondary | ICD-10-CM | POA: Diagnosis not present

## 2018-05-22 DIAGNOSIS — M25511 Pain in right shoulder: Secondary | ICD-10-CM | POA: Diagnosis not present

## 2018-05-22 DIAGNOSIS — G8929 Other chronic pain: Secondary | ICD-10-CM | POA: Diagnosis not present

## 2018-05-22 DIAGNOSIS — M25512 Pain in left shoulder: Secondary | ICD-10-CM

## 2018-05-22 DIAGNOSIS — M6281 Muscle weakness (generalized): Secondary | ICD-10-CM

## 2018-05-22 NOTE — Therapy (Signed)
[facility] [address] [place], [place], [address] Phone: [phone]   Fax:  [phone]  Physical Therapy Treatment  Patient Details  Name: Jean Smith MRN: [id number] Date of Birth: [birth year] Referring Provider (PT): Dr. Pricilla Holm   Encounter Date: 05/22/2018  PT End of Session - 05/22/18 0934    Visit Number  2    Date for PT Re-Evaluation  07/14/18    Authorization Type  BCBS 30 visit limit    PT Start Time  0934    PT Stop Time  1035    PT Time Calculation (min)  61 min    Activity Tolerance  Patient tolerated treatment well    Behavior During Therapy  [facility] for tasks assessed/performed       Past Medical History:  Diagnosis Date  . Allergy   . Anxiety   . Cataract    bilateral - MD is just watching   . Diverticulosis   . Fuchs' endothelial dystrophy    follows with optho regularly   . Gestational diabetes   . History of depression   . HSV-2 infection   . Hyperlipidemia    no meds - diet controlled  . Hyperplastic colon polyp   . Mitral valve prolapse   . PVC's (premature ventricular contractions)    intol of BB, sxc palpitations r/t stress  . RVOT ventricular tachycardia/PVCs    EP eval 01/2013 for freq PVCs    Past Surgical History:  Procedure Laterality Date  . CESAREAN SECTION     x 3  . KNEE SURGERY Right   . MANDIBLE SURGERY     right side in front of ear  . WISDOM TOOTH EXTRACTION      There were no vitals filed for this visit.  Subjective Assessment - 05/22/18 0936    Currently in Pain?  --   Rt shoulder dull ache 4/10: Lt shoulder 2/10 less achey                      [facility] Adult PT Treatment/Exercise - 05/22/18 0001      Moist Heat Therapy   Number Minutes Moist Heat  15 Minutes    Moist Heat Location  --   Bil shoulders post session     Electrical Stimulation   Electrical Stimulation Location  Bil shoulders    Electrical Stimulation  Action  IFC    Electrical Stimulation Parameters  80-150 Hz     Electrical Stimulation Goals  Pain             PT Education - 05/22/18 0959    Education Details  Shoulder isometrics for HEP, standing rows with yello wband    Person(s) Educated  Patient    Methods  Explanation;Demonstration;Tactile cues;Verbal cues;Handout    Comprehension  Returned demonstration;Verbalized understanding       PT Short Term Goals - 05/19/18 1655      PT SHORT TERM GOAL #1   Title  The patient will demonstrate compliance with initial HEP and self care strategies    Time  4    Period  Weeks    Status  New    Target Date  06/16/18      PT SHORT TERM GOAL #2   Title  The patient will have improved shoulder ROM for flexion/elevation to 150 degrees on right and 160 degrees on left needed for putting on a pullover top    Time  4  Period  Weeks    Status  New      PT SHORT TERM GOAL #3   Title  The patient will report a 30% improvement in bilateral shoulder pain with dressing and household ADLs    Time  4    Period  Weeks    Status  New        PT Long Term Goals - 05/19/18 1659      PT LONG TERM GOAL #1   Title  The patient will be independent with safe self progression of HEP needed for further improvements in pain and function    Time  8    Period  Weeks    Status  New    Target Date  07/14/18      PT LONG TERM GOAL #2   Title  The patient will have improved right shoulder strength to grossly 4-/5 and left shoulder to 4/5 needed for lifting her tea kettle.      Time  8    Period  Weeks    Status  New      PT LONG TERM GOAL #3   Title  The patient will report a 50% improvement in overall function including dressing, household ADLs and driving    Time  8    Period  Weeks    Status  New      PT LONG TERM GOAL #4   Title  FOTO functional outcome score improved from 62% limitation to 38% limitation     Time  8    Period  Weeks    Status  New            Plan -  05/22/18 0950    Clinical Impression Statement  pt compliant with her initial HEP. Added isometrics to bil shoulder HEP today. Pt very much liked the pulleys and was verbally instructed how and where to purchase. Pt received mainly instructional cues today for new exercises to address her weakness. Use of Estim and heat at the end of her session helpe decrease post exercise soreness/anxiety.     Rehab Potential  Good    PT Frequency  2x / week    PT Duration  8 weeks    PT Treatment/Interventions  ADLs/Self Care Home Management;Cryotherapy;Electrical Stimulation;Ultrasound;Moist Heat;Iontophoresis 4mg /ml Dexamethasone;Neuromuscular re-education;Therapeutic activities;Therapeutic exercise;Manual techniques;Taping;Dry needling    PT Home Exercise Plan   Access Code: [email or web address]     Consulted and Agree with Plan of Care  Patient       Patient will benefit from skilled therapeutic intervention in order to improve the following deficits and impairments:  Pain, Decreased range of motion, Decreased strength, Decreased activity tolerance, Impaired UE functional use  Visit Diagnosis: Chronic right shoulder pain  Chronic left shoulder pain  Stiffness of right shoulder, not elsewhere classified  Muscle weakness (generalized)     Problem List Patient Active Problem List   Diagnosis Date Noted  . Urinary urgency 05/05/2018  . Chronic right shoulder pain 05/05/2018  . Vitamin D deficiency 05/05/2018  . Other fatigue 01/02/2018  . Allergic rhinitis 01/02/2018  . Weight gain 01/02/2018  . PVC (premature ventricular contraction) 03/09/2017  . Routine general medical examination at a health care facility 09/01/2015  . Hyperlipidemia 09/01/2015  . Varicose vein 08/27/2014  . Primary localized osteoarthrosis, lower leg 10/30/2013  . Anxiety state 03/23/2013  . 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 Depression 01/01/2013  . Abnormal stress echo 12/11/2012  . Palpitations 09/14/2010  Semaj Kham, PTA 05/22/2018, 11:23 AM  [facility] [address], [place], [place], [address] Phone: [phone]   Fax:  [phone]  Name: Jean Smith MRN: [id number] Date of Birth: [birth year]  Access Code: [email or web address]  URL: [email or web address]  Date: 05/22/2018  Prepared by: Myrene Galas   Exercises  Seated Scapular Retraction - 10 reps - 1 sets - 1x daily - 7x weekly  Seated Isometric Shoulder External Rotation - 10 reps - 1 sets - 5 hold - 1x daily - 7x weekly  Isometric Shoulder Flexion with Ball at [place] - 10 reps - 1 sets - 5 hold - 1x daily - 7x weekly  Seated Isometric Shoulder Internal Rotation with Towel - 10 reps - 1 sets - 5 hold - 1x daily - 7x weekly  Standing Row with Anchored Resistance - 10 reps - 1 sets - 2 hold - 2x daily - 7x weekly

## 2018-05-29 ENCOUNTER — Ambulatory Visit: Payer: BLUE CROSS/BLUE SHIELD | Admitting: Physical Therapy

## 2018-05-29 ENCOUNTER — Encounter: Payer: Self-pay | Admitting: Physical Therapy

## 2018-05-29 DIAGNOSIS — M25611 Stiffness of right shoulder, not elsewhere classified: Secondary | ICD-10-CM

## 2018-05-29 DIAGNOSIS — M25512 Pain in left shoulder: Secondary | ICD-10-CM

## 2018-05-29 DIAGNOSIS — M6281 Muscle weakness (generalized): Secondary | ICD-10-CM

## 2018-05-29 DIAGNOSIS — M25511 Pain in right shoulder: Principal | ICD-10-CM

## 2018-05-29 DIAGNOSIS — G8929 Other chronic pain: Secondary | ICD-10-CM

## 2018-05-29 NOTE — Therapy (Signed)
[facility] [address] [place], [place], [address] Phone: [phone]   Fax:  [phone]  Physical Therapy Treatment  Patient Details  Name: Jean Smith MRN: [id number] Date of Birth: 02/27/1951 Referring Provider (PT): Dr. Pricilla Holm   Encounter Date: 05/29/2018  PT End of Session - 05/29/18 1454    Visit Number  3    Date for PT Re-Evaluation  07/14/18    Authorization Type  BCBS 30 visit limit    PT Start Time  3818    PT Stop Time  1537    PT Time Calculation (min)  43 min    Activity Tolerance  Patient tolerated treatment well    Behavior During Therapy  [facility] for tasks assessed/performed       Past Medical History:  Diagnosis Date  . Allergy   . Anxiety   . Cataract    bilateral - MD is just watching   . Diverticulosis   . Fuchs' endothelial dystrophy    follows with optho regularly   . Gestational diabetes   . History of depression   . HSV-2 infection   . Hyperlipidemia    no meds - diet controlled  . Hyperplastic colon polyp   . Mitral valve prolapse   . PVC's (premature ventricular contractions)    intol of BB, sxc palpitations r/t stress  . RVOT ventricular tachycardia/PVCs    EP eval 01/2013 for freq PVCs    Past Surgical History:  Procedure Laterality Date  . CESAREAN SECTION     x 3  . KNEE SURGERY Right   . MANDIBLE SURGERY     right side in front of ear  . WISDOM TOOTH EXTRACTION      There were no vitals filed for this visit.  Subjective Assessment - 05/29/18 1457    Subjective  I find my neck is hurting more. Maybe I am using bad form.     Currently in Pain?  --   RT 4/10, LT 2/4   Pain Location  Shoulder    Pain Orientation  Right;Left    Aggravating Factors   Overdoing    Pain Relieving Factors  topical rub    Multiple Pain Sites  No                       [facility] Adult PT Treatment/Exercise - 05/29/18 0001      Shoulder Exercises: Supine    External Rotation  Strengthening;Both;5 reps;Theraband   2x5   Theraband Level (Shoulder External Rotation)  Level 1 (Yellow)      Shoulder Exercises: Standing   Row  Strengthening;Both;15 reps;Theraband    Theraband Level (Shoulder Row)  Level 1 (Yellow)      Shoulder Exercises: Pulleys   Flexion  3 minutes   Concurrent review of status.     Shoulder Exercises: Isometric Strengthening   Flexion  3X5"   Bil   External Rotation  3X5"   Bil   Internal Rotation  3X5"   Bil     Iontophoresis   Type of Iontophoresis  Dexamethasone   #1   Location  Bil lateral shoulder    Dose  1 ml    Time  6 hr wear   Skin intact            PT Education - 05/29/18 1521    Education Details  Shoulder ER supine wiht yellow band    Person(s) Educated  Patient  Methods  Explanation;Demonstration;Tactile cues;Verbal cues;Handout    Comprehension  Returned demonstration;Verbalized understanding       PT Short Term Goals - 05/19/18 1655      PT SHORT TERM GOAL #1   Title  The patient will demonstrate compliance with initial HEP and self care strategies    Time  4    Period  Weeks    Status  New    Target Date  06/16/18      PT SHORT TERM GOAL #2   Title  The patient will have improved shoulder ROM for flexion/elevation to 150 degrees on right and 160 degrees on left needed for putting on a pullover top    Time  4    Period  Weeks    Status  New      PT SHORT TERM GOAL #3   Title  The patient will report a 30% improvement in bilateral shoulder pain with dressing and household ADLs    Time  4    Period  Weeks    Status  New        PT Long Term Goals - 05/19/18 1659      PT LONG TERM GOAL #1   Title  The patient will be independent with safe self progression of HEP needed for further improvements in pain and function    Time  8    Period  Weeks    Status  New    Target Date  07/14/18      PT LONG TERM GOAL #2   Title  The patient will have improved right shoulder  strength to grossly 4-/5 and left shoulder to 4/5 needed for lifting her tea kettle.      Time  8    Period  Weeks    Status  New      PT LONG TERM GOAL #3   Title  The patient will report a 50% improvement in overall function including dressing, household ADLs and driving    Time  8    Period  Weeks    Status  New      PT LONG TERM GOAL #4   Title  FOTO functional outcome score improved from 62% limitation to 38% limitation     Time  8    Period  Weeks    Status  New            Plan - 05/29/18 1500    Clinical Impression Statement  Pt concerned she is using her neck too much during her isometrics bc her neck really hurt over the weekned. She did report doing a lot of getting ready for a visit from her daughter  and that may have also contributed to her pain.  Upon waching pt perform her isometrics she indeed stabilizes heavily with her neck. we dicussed strategies to help reduce her neck tension. She has not looked into getting a pulley for home.  Added supine yellow band shoulder ER for HEP today.     Rehab Potential  Good    PT Frequency  2x / week    PT Duration  8 weeks    PT Treatment/Interventions  ADLs/Self Care Home Management;Cryotherapy;Electrical Stimulation;Ultrasound;Moist Heat;Iontophoresis 4mg /ml Dexamethasone;Neuromuscular re-education;Therapeutic activities;Therapeutic exercise;Manual techniques;Taping;Dry needling    PT Home Exercise Plan   Access Code: [email or web address]     Consulted and Agree with Plan of Care  Patient       Patient will benefit from skilled therapeutic intervention in order to improve  the following deficits and impairments:  Pain, Decreased range of motion, Decreased strength, Decreased activity tolerance, Impaired UE functional use  Visit Diagnosis: Chronic right shoulder pain  Chronic left shoulder pain  Stiffness of right shoulder, not elsewhere classified  Muscle weakness (generalized)     Problem List Patient Active Problem List    Diagnosis Date Noted  . Urinary urgency 05/05/2018  . Chronic right shoulder pain 05/05/2018  . Vitamin D deficiency 05/05/2018  . Other fatigue 01/02/2018  . Allergic rhinitis 01/02/2018  . Weight gain 01/02/2018  . PVC (premature ventricular contraction) 03/09/2017  . Routine general medical examination at a health care facility 09/01/2015  . Hyperlipidemia 09/01/2015  . Varicose vein 08/27/2014  . Primary localized osteoarthrosis, lower leg 10/30/2013  . Anxiety state 03/23/2013  . 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 Depression 01/01/2013  . Abnormal stress echo 12/11/2012  . Palpitations 09/14/2010    ,, PTA 05/29/2018, 3:39 PM  [facility] [address] [place], [place], [address] Phone: [phone]   Fax:  [phone]  Name: Jean Smith MRN: [id number] Date of Birth: Oct 09, 1950  Access Code: [email or web address]  URL: [email or web address]  Date: 05/29/2018  Prepared by: Myrene Galas   Exercises  Seated Scapular Retraction - 10 reps - 1 sets - 1x daily - 7x weekly  Seated Isometric Shoulder External Rotation - 10 reps - 1 sets - 5 hold - 1x daily - 7x weekly  Isometric Shoulder Flexion with Ball at [place] - 10 reps - 1 sets - 5 hold - 1x daily - 7x weekly  Seated Isometric Shoulder Internal Rotation with Towel - 10 reps - 1 sets - 5 hold - 1x daily - 7x weekly  Standing Row with Anchored Resistance - 10 reps - 1 sets - 2 hold - 2x daily - 7x weekly  Supine Shoulder External Rotation with Resistance - 5 reps - 2 sets - 2 hold - 1x daily - 7x weekly

## 2018-05-29 NOTE — Patient Instructions (Signed)

## 2018-05-30 DIAGNOSIS — F4323 Adjustment disorder with mixed anxiety and depressed mood: Secondary | ICD-10-CM | POA: Diagnosis not present

## 2018-05-31 ENCOUNTER — Encounter: Payer: Self-pay | Admitting: Physical Therapy

## 2018-05-31 ENCOUNTER — Ambulatory Visit: Payer: BLUE CROSS/BLUE SHIELD | Admitting: Physical Therapy

## 2018-05-31 DIAGNOSIS — M6281 Muscle weakness (generalized): Secondary | ICD-10-CM | POA: Diagnosis not present

## 2018-05-31 DIAGNOSIS — M25512 Pain in left shoulder: Secondary | ICD-10-CM

## 2018-05-31 DIAGNOSIS — G8929 Other chronic pain: Secondary | ICD-10-CM

## 2018-05-31 DIAGNOSIS — M25511 Pain in right shoulder: Principal | ICD-10-CM

## 2018-05-31 DIAGNOSIS — M25611 Stiffness of right shoulder, not elsewhere classified: Secondary | ICD-10-CM | POA: Diagnosis not present

## 2018-05-31 NOTE — Therapy (Signed)
[facility] [address], [place], [place], [address] Phone: [phone]   Fax:  [phone]  Physical Therapy Treatment  Patient Details  Name: Jean Smith MRN: [id number] Date of Birth: 1950-12-27 Referring Provider (PT): Dr. Pricilla Holm   Encounter Date: 05/31/2018  PT End of Session - 05/31/18 1502    Visit Number  4    Date for PT Re-Evaluation  07/14/18    Authorization Type  BCBS 30 visit limit    PT Start Time  1500   15 min late   PT Stop Time  1544    PT Time Calculation (min)  44 min    Activity Tolerance  Patient tolerated treatment well    Behavior During Therapy  [facility] for tasks assessed/performed       Past Medical History:  Diagnosis Date  . Allergy   . Anxiety   . Cataract    bilateral - MD is just watching   . Diverticulosis   . Fuchs' endothelial dystrophy    follows with optho regularly   . Gestational diabetes   . History of depression   . HSV-2 infection   . Hyperlipidemia    no meds - diet controlled  . Hyperplastic colon polyp   . Mitral valve prolapse   . PVC's (premature ventricular contractions)    intol of BB, sxc palpitations r/t stress  . RVOT ventricular tachycardia/PVCs    EP eval 01/2013 for freq PVCs    Past Surgical History:  Procedure Laterality Date  . CESAREAN SECTION     x 3  . KNEE SURGERY Right   . MANDIBLE SURGERY     right side in front of ear  . WISDOM TOOTH EXTRACTION      There were no vitals filed for this visit.  Subjective Assessment - 05/31/18 1503    Subjective  Initially the patch itched a lot but then it went away and I was able to wear it  for the entire length. Skin was intact.     Pertinent History  "Jean Smith"    Currently in Pain?  --   RT shoulder 4/10, Lt shoulder 1/10   Pain Orientation  Right;Left                       [facility] Adult PT Treatment/Exercise - 05/31/18 0001      Shoulder Exercises: Supine   Other Supine Exercises  Closed chain circles bil with 2# wt. 10x    PTA had to give pt the weight after lifting her arm to 90     Shoulder Exercises: Seated   Flexion  --   1# wt to 90 degrees and scaption 6x each bil     Shoulder Exercises: Standing   Extension  Strengthening;Both;10 reps;Theraband    Theraband Level (Shoulder Extension)  Level 2 (Red)    Row  AAROM;Both;20 reps;Theraband   Issued red band for HEP progression   Theraband Level (Shoulder Row)  Level 2 (Red)      Shoulder Exercises: Pulleys   Flexion  3 minutes   Concurrent review of status.     Iontophoresis   Type of Iontophoresis  Dexamethasone   #2 skin intact   Location  Bil lateral shoulder    Dose  1 ml    Time  6 hr wear   Skin intact              PT Short Term Goals -  05/31/18 1547      PT SHORT TERM GOAL #1   Title  The patient will demonstrate compliance with initial HEP and self care strategies    Time  4    Period  Weeks    Status  Achieved        PT Long Term Goals - 05/19/18 1659      PT LONG TERM GOAL #1   Title  The patient will be independent with safe self progression of HEP needed for further improvements in pain and function    Time  8    Period  Weeks    Status  New    Target Date  07/14/18      PT LONG TERM GOAL #2   Title  The patient will have improved right shoulder strength to grossly 4-/5 and left shoulder to 4/5 needed for lifting her tea kettle.      Time  8    Period  Weeks    Status  New      PT LONG TERM GOAL #3   Title  The patient will report a 50% improvement in overall function including dressing, household ADLs and driving    Time  8    Period  Weeks    Status  New      PT LONG TERM GOAL #4   Title  FOTO functional outcome score improved from 62% limitation to 38% limitation     Time  8    Period  Weeks    Status  New            Plan - 05/31/18 1502    Clinical Impression Statement  Pt continues to be compliant with her HEP. Today  she was able to increase resistance with her theraband for home and add additional posterior shoulder strengthening exercises. Much less cuing to relax her neck today than on last visit. Pt tolerated the first ionto patch and the second was applied today. Had to tape down th eback edge of the RT shoulder as it was sticking great.     Rehab Potential  Good    PT Frequency  2x / week    PT Duration  8 weeks    PT Treatment/Interventions  ADLs/Self Care Home Management;Cryotherapy;Electrical Stimulation;Ultrasound;Moist Heat;Iontophoresis 4mg /ml Dexamethasone;Neuromuscular re-education;Therapeutic activities;Therapeutic exercise;Manual techniques;Taping;Dry needling    PT Next Visit Plan  #3 ionto, continue with bil shoulder strength and stabilization exercises.     PT Home Exercise Plan   Access Code: [email or web address]     Consulted and Agree with Plan of Care  Patient       Patient will benefit from skilled therapeutic intervention in order to improve the following deficits and impairments:  Pain, Decreased range of motion, Decreased strength, Decreased activity tolerance, Impaired UE functional use  Visit Diagnosis: Chronic right shoulder pain  Chronic left shoulder pain  Muscle weakness (generalized)  Stiffness of right shoulder, not elsewhere classified     Problem List Patient Active Problem List   Diagnosis Date Noted  . Urinary urgency 05/05/2018  . Chronic right shoulder pain 05/05/2018  . Vitamin D deficiency 05/05/2018  . Other fatigue 01/02/2018  . Allergic rhinitis 01/02/2018  . Weight gain 01/02/2018  . PVC (premature ventricular contraction) 03/09/2017  . Routine general medical examination at a health care facility 09/01/2015  . Hyperlipidemia 09/01/2015  . Varicose vein 08/27/2014  . Primary localized osteoarthrosis, lower leg 10/30/2013  . Anxiety state 03/23/2013  . 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  Depression 01/01/2013  . Abnormal stress echo  12/11/2012  . Palpitations 09/14/2010    Jean Smith, PTA 05/31/2018, 3:48 PM  [facility] [address] [place], [place], [address] Phone: [phone]   Fax:  [phone]  Name: Jean Smith MRN: [id number] Date of Birth: [birth year]  Access Code: [email or web address]  URL: [email or web address]  Date: 05/31/2018  Prepared by: Myrene Galas   Exercises  Seated Scapular Retraction - 10 reps - 1 sets - 1x daily - 7x weekly  Seated Isometric Shoulder External Rotation - 10 reps - 1 sets - 5 hold - 1x daily - 7x weekly  Isometric Shoulder Flexion with Ball at [place] - 10 reps - 1 sets - 5 hold - 1x daily - 7x weekly  Seated Isometric Shoulder Internal Rotation with Towel - 10 reps - 1 sets - 5 hold - 1x daily - 7x weekly  Standing Row with Anchored Resistance - 10 reps - 1 sets - 2 hold - 2x daily - 7x weekly  Supine Shoulder External Rotation with Resistance - 5 reps - 2 sets - 2 hold - 2x daily - 7x weekly  Standing Alternating Shoulder Extension with Resistance - 10 reps - 2 sets - 2x daily - 7x weekly

## 2018-06-02 ENCOUNTER — Encounter

## 2018-06-05 ENCOUNTER — Encounter: Payer: BLUE CROSS/BLUE SHIELD | Admitting: Physical Therapy

## 2018-06-08 ENCOUNTER — Ambulatory Visit: Payer: BLUE CROSS/BLUE SHIELD | Admitting: Physical Therapy

## 2018-06-15 ENCOUNTER — Encounter: Payer: BLUE CROSS/BLUE SHIELD | Admitting: Physical Therapy

## 2018-06-15 DIAGNOSIS — F4323 Adjustment disorder with mixed anxiety and depressed mood: Secondary | ICD-10-CM | POA: Diagnosis not present

## 2018-06-16 ENCOUNTER — Encounter

## 2018-06-19 DIAGNOSIS — F4323 Adjustment disorder with mixed anxiety and depressed mood: Secondary | ICD-10-CM | POA: Diagnosis not present

## 2018-06-20 ENCOUNTER — Encounter: Payer: Self-pay | Admitting: Physical Therapy

## 2018-06-20 ENCOUNTER — Ambulatory Visit: Payer: BLUE CROSS/BLUE SHIELD | Attending: Internal Medicine | Admitting: Physical Therapy

## 2018-06-20 DIAGNOSIS — G8929 Other chronic pain: Secondary | ICD-10-CM | POA: Diagnosis not present

## 2018-06-20 DIAGNOSIS — M6281 Muscle weakness (generalized): Secondary | ICD-10-CM | POA: Diagnosis not present

## 2018-06-20 DIAGNOSIS — M25611 Stiffness of right shoulder, not elsewhere classified: Secondary | ICD-10-CM | POA: Insufficient documentation

## 2018-06-20 DIAGNOSIS — M25512 Pain in left shoulder: Secondary | ICD-10-CM | POA: Diagnosis not present

## 2018-06-20 DIAGNOSIS — M25511 Pain in right shoulder: Secondary | ICD-10-CM | POA: Diagnosis not present

## 2018-06-20 NOTE — Patient Instructions (Signed)
Access Code: [email or web address]  URL: [email or web address]  Date: 06/20/2018  Prepared by: Ruben Im   Exercises  Seated Scapular Retraction - 10 reps - 1 sets - 1x daily - 7x weekly  Seated Isometric Shoulder External Rotation - 10 reps - 1 sets - 5 hold - 1x daily - 7x weekly  Isometric Shoulder Flexion with Ball at [place] - 10 reps - 1 sets - 5 hold - 1x daily - 7x weekly  Seated Isometric Shoulder Internal Rotation with Towel - 10 reps - 1 sets - 5 hold - 1x daily - 7x weekly  Standing Row with Anchored Resistance - 10 reps - 1 sets - 2 hold - 2x daily - 7x weekly  Supine Shoulder External Rotation with Resistance - 5 reps - 2 sets - 2 hold - 2x daily - 7x weekly  Standing Alternating Shoulder Extension with Resistance - 10 reps - 2 sets - 2x daily - 7x weekly  Prone Shoulder Row - 10 reps - 1 sets - 1x daily - 7x weekly  Prone Single Arm Shoulder Extension - 10 reps - 1 sets - 1x daily - 7x weekly

## 2018-06-20 NOTE — Therapy (Signed)
[facility] [address] [place], [place], [address] Phone: [phone]   Fax:  [phone]  Physical Therapy Treatment  Patient Details  Name: Jean Smith MRN: [id number] Date of Birth: 1951-04-25 Referring Provider (PT): Dr. Pricilla Holm   Encounter Date: 06/20/2018  PT End of Session - 06/20/18 1153    Visit Number  5    Date for PT Re-Evaluation  07/14/18    Authorization Type  BC[employer] 30 visit limit    PT Start Time  1107    PT Stop Time  1152    PT Time Calculation (min)  45 min    Activity Tolerance  Patient tolerated treatment well       Past Medical History:  Diagnosis Date  . Allergy   . Anxiety   . Cataract    bilateral - MD is just watching   . Diverticulosis   . Fuchs' endothelial dystrophy    follows with optho regularly   . Gestational diabetes   . History of depression   . HSV-2 infection   . Hyperlipidemia    no meds - diet controlled  . Hyperplastic colon polyp   . Mitral valve prolapse   . PVC's (premature ventricular contractions)    intol of BB, sxc palpitations r/t stress  . RVOT ventricular tachycardia/PVCs    EP eval 01/2013 for freq PVCs    Past Surgical History:  Procedure Laterality Date  . CESAREAN SECTION     x 3  . KNEE SURGERY Right   . MANDIBLE SURGERY     right side in front of ear  . WISDOM TOOTH EXTRACTION      There were no vitals filed for this visit.  Subjective Assessment - 06/20/18 1110    Subjective  I've been sick so I haven't done much of the exercises.  Sometimes it hurts when I lie on it.  I didn't like the ionto patch b/c it itched too much for an hour before it stopped.      Pertinent History  "Kyler"    Currently in Pain?  No/denies    Pain Score  0-No pain    Pain Location  Shoulder    Pain Type  Chronic pain    Aggravating Factors   only with movement especially overhead and out to the side         [facility] PT Assessment - 06/20/18  0001      AROM   Right Shoulder Flexion  161 Degrees   painful 4/10   Right Shoulder ABduction  168 Degrees    Left Shoulder Flexion  163 Degrees    Left Shoulder ABduction  166 Degrees                   [facility] Adult PT Treatment/Exercise - 06/20/18 0001      Shoulder Exercises: Supine   Protraction  Strengthening;Right;Left;10 reps;Weights    Protraction Weight (lbs)  1      Shoulder Exercises: Prone   Retraction  Strengthening;Right;10 reps    Retraction Weight (lbs)  1    Extension  Strengthening;Right;10 reps;Weights      Shoulder Exercises: Sidelying   External Rotation  Strengthening;Right;10 reps;Weights    External Rotation Weight (lbs)  1      Shoulder Exercises: Standing   Extension  Strengthening;Right;Left;10 reps;Theraband    Theraband Level (Shoulder Extension)  Level 2 (Red)    Row  Strengthening;Right;Left;15 reps;Theraband    Theraband Level (  Shoulder Row)  Level 2 (Red)    Other Standing Exercises  wall push ups 15x    Other Standing Exercises  --      Manual Therapy   Kinesiotex  Facilitate Muscle      Kinesiotix   Facilitate Muscle   3 strips right deltoid              PT Education - 06/20/18 1148    Education Details   Access Code: [email or web address]  prone rows and extensions    Person(s) Educated  Patient    Methods  Explanation;Handout;Demonstration    Comprehension  Returned demonstration;Verbalized understanding       PT Short Term Goals - 06/20/18 1210      PT SHORT TERM GOAL #1   Title  The patient will demonstrate compliance with initial HEP and self care strategies    Status  Achieved      PT SHORT TERM GOAL #2   Title  The patient will have improved shoulder ROM for flexion/elevation to 150 degrees on right and 160 degrees on left needed for putting on a pullover top    Status  Achieved      PT SHORT TERM GOAL #3   Title  The patient will report a 30% improvement in bilateral shoulder pain with dressing and household  ADLs    Time  4    Period  Weeks    Status  On-going        PT Long Term Goals - 06/20/18 1210      PT LONG TERM GOAL #1   Title  The patient will be independent with safe self progression of HEP needed for further improvements in pain and function    Time  8    Period  Weeks    Status  On-going      PT LONG TERM GOAL #2   Title  The patient will have improved right shoulder strength to grossly 4-/5 and left shoulder to 4/5 needed for lifting her tea kettle.      Time  8    Period  Weeks    Status  On-going      PT LONG TERM GOAL #3   Title  The patient will report a 50% improvement in overall function including dressing, household ADLs and driving    Time  8    Period  Weeks    Status  On-going      PT LONG TERM GOAL #4   Title  FOTO functional outcome score improved from 62% limitation to 38% limitation     Time  8    Period  Weeks    Status  On-going            Plan - 06/20/18 1142    Clinical Impression Statement  The patient has made good improvements in shoulder ROM bilaterally although still painful with flexion and abduction.  She is somewhat fearful of physical activity although she states she feels "safe" with prone exercises and likes the feel of the supportive tape.  She needs verbal and tactile cues to activate periscapular muscles with ex's.  Will hold ionto secondary to complaints of not liking the tingling associated with the battery of the iontophoresis patch.      Rehab Potential  Good    PT Frequency  2x / week    PT Duration  8 weeks    PT Treatment/Interventions  ADLs/Self Care Home Management;Cryotherapy;Electrical Stimulation;Ultrasound;Moist Heat;Iontophoresis 4mg /ml Dexamethasone;Neuromuscular re-education;Therapeutic activities;Therapeutic  exercise;Manual techniques;Taping;Dry needling    PT Next Visit Plan  hold ionto;  check % improvement for STG;  assess response to KT tape;  scapular strengthening;  try steering wheels and UBE    PT Home  Exercise Plan   Access Code: [email or web address]        Patient will benefit from skilled therapeutic intervention in order to improve the following deficits and impairments:  Pain, Decreased range of motion, Decreased strength, Decreased activity tolerance, Impaired UE functional use  Visit Diagnosis: Chronic right shoulder pain  Chronic left shoulder pain  Muscle weakness (generalized)  Stiffness of right shoulder, not elsewhere classified     Problem List Patient Active Problem List   Diagnosis Date Noted  . Urinary urgency 05/05/2018  . Chronic right shoulder pain 05/05/2018  . Vitamin D deficiency 05/05/2018  . Other fatigue 01/02/2018  . Allergic rhinitis 01/02/2018  . Weight gain 01/02/2018  . PVC (premature ventricular contraction) 03/09/2017  . Routine general medical examination at a health care facility 09/01/2015  . Hyperlipidemia 09/01/2015  . Varicose vein 08/27/2014  . Primary localized osteoarthrosis, lower leg 10/30/2013  . Anxiety state 03/23/2013  . 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 Depression 01/01/2013  . Abnormal stress echo 12/11/2012  . Palpitations 09/14/2010   Ruben Im, PT 06/20/18 12:11 PM Phone: [phone] Fax: [phone]  Alvera Singh 06/20/2018, 12:11 PM  [facility] [address], [place], [place], [address] Phone: [phone]   Fax:  [phone]  Name: TYKIERA RAVEN MRN: [id number] Date of Birth: 03/05/1951

## 2018-06-23 ENCOUNTER — Ambulatory Visit: Payer: BLUE CROSS/BLUE SHIELD | Admitting: Physical Therapy

## 2018-06-23 ENCOUNTER — Encounter: Payer: Self-pay | Admitting: Physical Therapy

## 2018-06-23 DIAGNOSIS — M25512 Pain in left shoulder: Secondary | ICD-10-CM

## 2018-06-23 DIAGNOSIS — G8929 Other chronic pain: Secondary | ICD-10-CM | POA: Diagnosis not present

## 2018-06-23 DIAGNOSIS — M25511 Pain in right shoulder: Principal | ICD-10-CM

## 2018-06-23 DIAGNOSIS — M25611 Stiffness of right shoulder, not elsewhere classified: Secondary | ICD-10-CM | POA: Diagnosis not present

## 2018-06-23 DIAGNOSIS — M6281 Muscle weakness (generalized): Secondary | ICD-10-CM

## 2018-06-23 NOTE — Therapy (Signed)
[facility] [address] [place], [place], [address] Phone: [phone]   Fax:  [phone]  Physical Therapy Treatment  Patient Details  Name: Jean Smith MRN: [id number] Date of Birth: 1951-03-13 Referring Provider (PT): Dr. Pricilla Holm   Encounter Date: 06/23/2018  PT End of Session - 06/23/18 1123    Visit Number  6    Date for PT Re-Evaluation  07/14/18    Authorization Type  [employer]BS 30 visit limit    PT Start Time  1108    PT Stop Time  1146    PT Time Calculation (min)  38 min    Activity Tolerance  Patient tolerated treatment well       Past Medical History:  Diagnosis Date  . Allergy   . Anxiety   . Cataract    bilateral - MD is just watching   . Diverticulosis   . Fuchs' endothelial dystrophy    follows with optho regularly   . Gestational diabetes   . History of depression   . HSV-2 infection   . Hyperlipidemia    no meds - diet controlled  . Hyperplastic colon polyp   . Mitral valve prolapse   . PVC's (premature ventricular contractions)    intol of BB, sxc palpitations r/t stress  . RVOT ventricular tachycardia/PVCs    EP eval 01/2013 for freq PVCs    Past Surgical History:  Procedure Laterality Date  . CESAREAN SECTION     x 3  . KNEE SURGERY Right   . MANDIBLE SURGERY     right side in front of ear  . WISDOM TOOTH EXTRACTION      There were no vitals filed for this visit.  Subjective Assessment - 06/23/18 1110    Subjective  I think the KT tape has helped.  A constant feeling of my shoulder but you couldn't call it much pain.  Left shoulder soreness following last visit.      Pertinent History  "Chaselyn"    Currently in Pain?  Yes    Pain Score  1     Pain Location  Shoulder    Pain Orientation  Right;Left    Pain Type  Chronic pain                       [facility] Adult PT Treatment/Exercise - 06/23/18 0001      Shoulder Exercises: Supine   Protraction   Strengthening;Right;Left;10 reps;Weights    Protraction Weight (lbs)  1    Other Supine Exercises  1# 12/6:00 and 3/9:00 10x each right/left       Shoulder Exercises: Prone   Retraction  Strengthening;Right;Left;10 reps    Retraction Weight (lbs)  1    Extension  Strengthening;Right;Left;10 reps;Weights    Extension Weight (lbs)  1    Horizontal ABduction 1  Strengthening;Right;Left;10 reps;Weights   small arc only   Horizontal ABduction 1 Weight (lbs)  1      Shoulder Exercises: Standing   Extension  Strengthening;Right;Left;10 reps;Theraband    Theraband Level (Shoulder Extension)  Level 2 (Red)    Row  Strengthening;Right;Left;15 reps;Theraband    Theraband Level (Shoulder Row)  Level 2 (Red)    Other Standing Exercises  wall push ups 15x      Shoulder Exercises: Therapy Ball   Other Therapy Ball Exercises  small ball circles 10x each direction right/left       Shoulder Exercises: ROM/Strengthening   UBE (  Upper Arm Bike)  3 min forward/backward L1    Ranger  on wall L5, L10 10x each right;  L10 L15 10x on left                PT Short Term Goals - 06/23/18 1126      PT SHORT TERM GOAL #1   Title  The patient will demonstrate compliance with initial HEP and self care strategies    Status  Achieved      PT SHORT TERM GOAL #2   Title  The patient will have improved shoulder ROM for flexion/elevation to 150 degrees on right and 160 degrees on left needed for putting on a pullover top    Status  Achieved      PT SHORT TERM GOAL #3   Title  The patient will report a 30% improvement in bilateral shoulder pain with dressing and household ADLs    Status  Achieved        PT Long Term Goals - 06/23/18 1127      PT LONG TERM GOAL #1   Title  The patient will be independent with safe self progression of HEP needed for further improvements in pain and function    Time  8    Period  Weeks    Status  On-going      PT LONG TERM GOAL #2   Title  The patient will have  improved right shoulder strength to grossly 4-/5 and left shoulder to 4/5 needed for lifting her tea kettle.      Time  8    Period  Weeks    Status  On-going      PT LONG TERM GOAL #3   Title  The patient will report a 50% improvement in overall function including dressing, household ADLs and driving    Status  Achieved      PT LONG TERM GOAL #4   Title  FOTO functional outcome score improved from 62% limitation to 38% limitation     Time  8    Period  Weeks    Status  On-going            Plan - 06/23/18 1124    Clinical Impression Statement  Overall improvement at 50-75% better.  She is able to perform glenohumeral and scapular strengthening with fewer cues to avoid compensatory shoulder hike.  Therapist closely monitoring technique and pain response for modifications as needed.  She fatigues quickly with sustained shoulder positioning above 90 degrees.  She has met all STGs.   Continue with a progression of ROM and strengthening ex and KT tape for pain control.      Rehab Potential  Good    PT Frequency  2x / week    PT Duration  8 weeks    PT Treatment/Interventions  ADLs/Self Care Home Management;Cryotherapy;Electrical Stimulation;Ultrasound;Moist Heat;Iontophoresis 50m/ml Dexamethasone;Neuromuscular re-education;Therapeutic activities;Therapeutic exercise;Manual techniques;Taping;Dry needling    PT Next Visit Plan  hold ionto;  KT tape;  scapular strengthening;   UBE;  UE Ranger on wall;  recheck ROM and do FOTO next week    PT Home Exercise Plan   Access Code: [email or web address]        Patient will benefit from skilled therapeutic intervention in order to improve the following deficits and impairments:  Pain, Decreased range of motion, Decreased strength, Decreased activity tolerance, Impaired UE functional use  Visit Diagnosis: Chronic right shoulder pain  Chronic left shoulder pain  Muscle weakness (generalized)  Stiffness  of right shoulder, not elsewhere  classified     Problem List Patient Active Problem List   Diagnosis Date Noted  . Urinary urgency 05/05/2018  . Chronic right shoulder pain 05/05/2018  . Vitamin D deficiency 05/05/2018  . Other fatigue 01/02/2018  . Allergic rhinitis 01/02/2018  . Weight gain 01/02/2018  . PVC (premature ventricular contraction) 03/09/2017  . Routine general medical examination at a health care facility 09/01/2015  . Hyperlipidemia 09/01/2015  . Varicose vein 08/27/2014  . Primary localized osteoarthrosis, lower leg 10/30/2013  . Anxiety state 03/23/2013  . 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 Depression 01/01/2013  . Abnormal stress echo 12/11/2012  . Palpitations 09/14/2010   Ruben Im, PT 06/23/18 11:53 AM Phone: [phone] Fax: [phone]  Alvera Singh 06/23/2018, 11:52 AM  [facility] [address] [place], [place], [address] Phone: [phone]   Fax:  [phone]  Name: MALLARY KREGER MRN: [id number] Date of Birth: [birth year]

## 2018-06-26 DIAGNOSIS — F4323 Adjustment disorder with mixed anxiety and depressed mood: Secondary | ICD-10-CM | POA: Diagnosis not present

## 2018-06-27 ENCOUNTER — Ambulatory Visit: Payer: BLUE CROSS/BLUE SHIELD | Admitting: Physical Therapy

## 2018-06-27 ENCOUNTER — Encounter: Payer: Self-pay | Admitting: Physical Therapy

## 2018-06-27 DIAGNOSIS — G8929 Other chronic pain: Secondary | ICD-10-CM | POA: Diagnosis not present

## 2018-06-27 DIAGNOSIS — M25611 Stiffness of right shoulder, not elsewhere classified: Secondary | ICD-10-CM | POA: Diagnosis not present

## 2018-06-27 DIAGNOSIS — M25512 Pain in left shoulder: Secondary | ICD-10-CM

## 2018-06-27 DIAGNOSIS — M25511 Pain in right shoulder: Principal | ICD-10-CM

## 2018-06-27 DIAGNOSIS — M6281 Muscle weakness (generalized): Secondary | ICD-10-CM | POA: Diagnosis not present

## 2018-06-27 NOTE — Therapy (Signed)
[facility] [address], [place], [place], [address] Phone: [phone]   Fax:  [phone]  Physical Therapy Treatment  Patient Details  Name: Jean Smith MRN: [id number] Date of Birth: September 01, 1950 Referring Provider (PT): Dr. Pricilla Holm   Encounter Date: 06/27/2018  PT End of Session - 06/27/18 1927    Visit Number  7    Date for PT Re-Evaluation  07/14/18    Authorization Type  BCBS 30 visit limit    PT Start Time  1110   pt late   PT Stop Time  1148    PT Time Calculation (min)  38 min    Activity Tolerance  Patient tolerated treatment well       Past Medical History:  Diagnosis Date  . Allergy   . Anxiety   . Cataract    bilateral - MD is just watching   . Diverticulosis   . Fuchs' endothelial dystrophy    follows with optho regularly   . Gestational diabetes   . History of depression   . HSV-2 infection   . Hyperlipidemia    no meds - diet controlled  . Hyperplastic colon polyp   . Mitral valve prolapse   . PVC's (premature ventricular contractions)    intol of BB, sxc palpitations r/t stress  . RVOT ventricular tachycardia/PVCs    EP eval 01/2013 for freq PVCs    Past Surgical History:  Procedure Laterality Date  . CESAREAN SECTION     x 3  . KNEE SURGERY Right   . MANDIBLE SURGERY     right side in front of ear  . WISDOM TOOTH EXTRACTION      There were no vitals filed for this visit.  Subjective Assessment - 06/27/18 1110    Subjective  My shoulder is OK.  I felt some stuff on Friday.  I think the KT helps.  Right > left.  Yesterday lifted it out to the side and I felt a sharp pain.  I have to go to wash my mother's hair when I leave here.      Pertinent History  "Jean Smith"    Patient Stated Goals  keep from having rotator cuff surgery like my parents did;  not make it worse     Currently in Pain?  Yes    Pain Score  1     Pain Location  Shoulder    Pain Orientation  Right     Pain Type  Chronic pain    Aggravating Factors   abduction, internal rotation         [facility] PT Assessment - 06/27/18 0001      AROM   Right Shoulder Flexion  169 Degrees    Right Shoulder ABduction  169 Degrees    Right Shoulder External Rotation  80 Degrees    Left Shoulder Flexion  175 Degrees    Left Shoulder ABduction  172 Degrees    Left Shoulder External Rotation  80 Degrees      Strength   Right Shoulder Flexion  4-/5    Right Shoulder Extension  4-/5    Right Shoulder ABduction  4-/5    Right Shoulder Internal Rotation  4-/5    Right Shoulder External Rotation  4-/5               Review of current HEP     [facility] Adult PT Treatment/Exercise - 06/27/18 0001      Shoulder Exercises:  Standing   Shoulder Elevation Limitations  red band diagonal extensions 2x10 right and left D1/D2    Other Standing Exercises  wall push ups 15x with red ball     Other Standing Exercises  yellow  band backward Cs 5x       Shoulder Exercises: ROM/Strengthening   UBE (Upper Arm Bike)  3 min forward/backward L1    Other ROM/Strengthening Exercises  seated bil UE flexion with foam roll on table top 15x      Manual Therapy   Kinesiotex  Facilitate Muscle      Kinesiotix   Facilitate Muscle   3 strips right deltoid                PT Short Term Goals - 06/23/18 1126      PT SHORT TERM GOAL #1   Title  The patient will demonstrate compliance with initial HEP and self care strategies    Status  Achieved      PT SHORT TERM GOAL #2   Title  The patient will have improved shoulder ROM for flexion/elevation to 150 degrees on right and 160 degrees on left needed for putting on a pullover top    Status  Achieved      PT SHORT TERM GOAL #3   Title  The patient will report a 30% improvement in bilateral shoulder pain with dressing and household ADLs    Status  Achieved        PT Long Term Goals - 06/23/18 1127      PT LONG TERM GOAL #1   Title  The patient will be  independent with safe self progression of HEP needed for further improvements in pain and function    Time  8    Period  Weeks    Status  On-going      PT LONG TERM GOAL #2   Title  The patient will have improved right shoulder strength to grossly 4-/5 and left shoulder to 4/5 needed for lifting her tea kettle.      Time  8    Period  Weeks    Status  On-going      PT LONG TERM GOAL #3   Title  The patient will report a 50% improvement in overall function including dressing, household ADLs and driving    Status  Achieved      PT LONG TERM GOAL #4   Title  FOTO functional outcome score improved from 62% limitation to 38% limitation     Time  8    Period  Weeks    Status  On-going            Plan - 06/27/18 1927    Clinical Impression Statement  The patient continues to make steady improvements with ROM and activation of periscapular muscles however these stabilizing muscles do fatigue very quickly.  Functionally she reports continued difficulty lifting groceries and with lifting her mom's walker in/out of the car.  She does report an improvement in symptoms with kinesiotape.  She is fearful of physical activity and would benefit from continued reassurance and pain education.  Therapist providing verbal cues to decrease compensatory shoulder hiking with shoulder elevation.      Rehab Potential  Good    PT Frequency  2x / week    PT Duration  8 weeks    PT Treatment/Interventions  ADLs/Self Care Home Management;Cryotherapy;Electrical Stimulation;Ultrasound;Moist Heat;Iontophoresis 4mg /ml Dexamethasone;Neuromuscular re-education;Therapeutic activities;Therapeutic exercise;Manual techniques;Taping;Dry needling    PT Next  Visit Plan  hold ionto;  KT tape;  scapular strengthening;   UBE;  UE Ranger on wall;  add red band diagonal extensions to HEP       Patient will benefit from skilled therapeutic intervention in order to improve the following deficits and impairments:  Pain,  Decreased range of motion, Decreased strength, Decreased activity tolerance, Impaired UE functional use  Visit Diagnosis: Chronic right shoulder pain  Chronic left shoulder pain  Muscle weakness (generalized)  Stiffness of right shoulder, not elsewhere classified     Problem List Patient Active Problem List   Diagnosis Date Noted  . Urinary urgency 05/05/2018  . Chronic right shoulder pain 05/05/2018  . Vitamin D deficiency 05/05/2018  . Other fatigue 01/02/2018  . Allergic rhinitis 01/02/2018  . Weight gain 01/02/2018  . PVC (premature ventricular contraction) 03/09/2017  . Routine general medical examination at a health care facility 09/01/2015  . Hyperlipidemia 09/01/2015  . Varicose vein 08/27/2014  . Primary localized osteoarthrosis, lower leg 10/30/2013  . Anxiety state 03/23/2013  . 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 Depression 01/01/2013  . Abnormal stress echo 12/11/2012  . Palpitations 09/14/2010   Ruben Im, PT 06/27/18 7:33 PM Phone: [phone] Fax: [phone] Alvera Singh 06/27/2018, 7:32 PM  [facility] [address], [place], [place], [address] Phone: [phone]   Fax:  [phone]  Name: Jean Smith MRN: [id number] Date of Birth: 1951/06/13

## 2018-06-30 ENCOUNTER — Encounter: Payer: Self-pay | Admitting: Physical Therapy

## 2018-06-30 ENCOUNTER — Ambulatory Visit: Payer: BLUE CROSS/BLUE SHIELD | Admitting: Physical Therapy

## 2018-06-30 DIAGNOSIS — M25511 Pain in right shoulder: Principal | ICD-10-CM

## 2018-06-30 DIAGNOSIS — G8929 Other chronic pain: Secondary | ICD-10-CM

## 2018-06-30 DIAGNOSIS — M25512 Pain in left shoulder: Secondary | ICD-10-CM | POA: Diagnosis not present

## 2018-06-30 DIAGNOSIS — M6281 Muscle weakness (generalized): Secondary | ICD-10-CM

## 2018-06-30 DIAGNOSIS — M25611 Stiffness of right shoulder, not elsewhere classified: Secondary | ICD-10-CM | POA: Diagnosis not present

## 2018-06-30 NOTE — Therapy (Signed)
[facility] [address] [place], [place], [address] Phone: [phone]   Fax:  [phone]  Physical Therapy Treatment  Patient Details  Name: Jean Smith MRN: [id number] Date of Birth: 11/24/1950 Referring Provider (PT): Dr. Pricilla Holm   Encounter Date: 06/30/2018  PT End of Session - 06/30/18 1140    Visit Number  8    Date for PT Re-Evaluation  07/14/18    Authorization Type  BCBS 30 visit limit    PT Start Time  1107    PT Stop Time  1145    PT Time Calculation (min)  38 min    Activity Tolerance  Patient tolerated treatment well       Past Medical History:  Diagnosis Date  . Allergy   . Anxiety   . Cataract    bilateral - MD is just watching   . Diverticulosis   . Fuchs' endothelial dystrophy    follows with optho regularly   . Gestational diabetes   . History of depression   . HSV-2 infection   . Hyperlipidemia    no meds - diet controlled  . Hyperplastic colon polyp   . Mitral valve prolapse   . PVC's (premature ventricular contractions)    intol of BB, sxc palpitations r/t stress  . RVOT ventricular tachycardia/PVCs    EP eval 01/2013 for freq PVCs    Past Surgical History:  Procedure Laterality Date  . CESAREAN SECTION     x 3  . KNEE SURGERY Right   . MANDIBLE SURGERY     right side in front of ear  . WISDOM TOOTH EXTRACTION      There were no vitals filed for this visit.  Subjective Assessment - 06/30/18 1110    Subjective  Not too bad.  I can't lie on that side.      Pertinent History  "Jean Smith"    Patient Stated Goals  keep from having rotator cuff surgery like my parents did;  not make it worse     Currently in Pain?  Yes    Pain Score  2     Pain Location  Shoulder    Pain Orientation  Right    Pain Type  Chronic pain                       [facility] Adult PT Treatment/Exercise - 06/30/18 0001      Shoulder Exercises: Supine   Other Supine  Exercises  A to Z 1# right/left       Shoulder Exercises: Seated   Flexion  Strengthening;Right;Left;10 reps;Weights    Flexion Weight (lbs)  1    Other Seated Exercises  scaption 1# 10x bil       Shoulder Exercises: Prone   Retraction  Right;Left;15 reps;Weights   kneeling on mat table    Retraction Weight (lbs)  1    Extension  Strengthening;Right;Left;15 reps;Weights    Extension Weight (lbs)  1    Horizontal ABduction 1  Strengthening;Right;Left;10 reps;Weights   small arc only   Horizontal ABduction 1 Weight (lbs)  1      Shoulder Exercises: Standing   Shoulder Elevation Limitations  red band diagonal extensions 2x10 right and left D1/D2    Other Standing Exercises  wall push ups 15x      Shoulder Exercises: ROM/Strengthening   UBE (Upper Arm Bike)  3 min forward/backward L2  PT Education - 06/30/18 1140    Education Details   Access Code: [email or web address]  red band diagonals extensions    Person(s) Educated  Patient    Methods  Explanation;Handout;Demonstration    Comprehension  Returned demonstration;Verbalized understanding       PT Short Term Goals - 06/23/18 1126      PT SHORT TERM GOAL #1   Title  The patient will demonstrate compliance with initial HEP and self care strategies    Status  Achieved      PT SHORT TERM GOAL #2   Title  The patient will have improved shoulder ROM for flexion/elevation to 150 degrees on right and 160 degrees on left needed for putting on a pullover top    Status  Achieved      PT SHORT TERM GOAL #3   Title  The patient will report a 30% improvement in bilateral shoulder pain with dressing and household ADLs    Status  Achieved        PT Long Term Goals - 06/23/18 1127      PT LONG TERM GOAL #1   Title  The patient will be independent with safe self progression of HEP needed for further improvements in pain and function    Time  8    Period  Weeks    Status  On-going      PT LONG TERM GOAL #2   Title  The  patient will have improved right shoulder strength to grossly 4-/5 and left shoulder to 4/5 needed for lifting her tea kettle.      Time  8    Period  Weeks    Status  On-going      PT LONG TERM GOAL #3   Title  The patient will report a 50% improvement in overall function including dressing, household ADLs and driving    Status  Achieved      PT LONG TERM GOAL #4   Title  FOTO functional outcome score improved from 62% limitation to 38% limitation     Time  8    Period  Weeks    Status  On-going            Plan - 06/30/18 1156    Clinical Impression Statement  The patient is able to progress glenohumeral and scapular strengthening exs with muscular fatigue but no increase in right/left shoulder pain.  Verbal cues to keep thumb up for better glenohumeral mechanics and to avoid compensatory shoulder shrug.  Good response to KT tape.      Rehab Potential  Good    PT Frequency  2x / week    PT Duration  8 weeks    PT Treatment/Interventions  ADLs/Self Care Home Management;Cryotherapy;Electrical Stimulation;Ultrasound;Moist Heat;Iontophoresis 4mg /ml Dexamethasone;Neuromuscular re-education;Therapeutic activities;Therapeutic exercise;Manual techniques;Taping;Dry needling    PT Next Visit Plan  hold ionto;  KT tape;  glenohumeral and scapular strengthening;   UBE;     PT Home Exercise Plan   Access Code: [email or web address]        Patient will benefit from skilled therapeutic intervention in order to improve the following deficits and impairments:  Pain, Decreased range of motion, Decreased strength, Decreased activity tolerance, Impaired UE functional use  Visit Diagnosis: Chronic right shoulder pain  Chronic left shoulder pain  Muscle weakness (generalized)  Stiffness of right shoulder, not elsewhere classified     Problem List Patient Active Problem List   Diagnosis Date Noted  . Urinary urgency 05/05/2018  . Chronic  right shoulder pain 05/05/2018  . Vitamin D deficiency  05/05/2018  . Other fatigue 01/02/2018  . Allergic rhinitis 01/02/2018  . Weight gain 01/02/2018  . PVC (premature ventricular contraction) 03/09/2017  . Routine general medical examination at a health care facility 09/01/2015  . Hyperlipidemia 09/01/2015  . Varicose vein 08/27/2014  . Primary localized osteoarthrosis, lower leg 10/30/2013  . Anxiety state 03/23/2013  . 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 Depression 01/01/2013  . Abnormal stress echo 12/11/2012  . Palpitations 09/14/2010   Ruben Im, PT 06/30/18 12:02 PM Phone: [phone] Fax: [phone] Alvera Singh 06/30/2018, 12:02 PM  [facility] [address] [place], [place], [address] Phone: [phone]   Fax:  [phone]  Name: Jean Smith MRN: [id number] Date of Birth: 1951-01-05

## 2018-06-30 NOTE — Patient Instructions (Signed)
Access Code: [email or web address]  URL: [email or web address]  Date: 06/30/2018  Prepared by: Ruben Im   Exercises  Seated Scapular Retraction - 10 reps - 1 sets - 1x daily - 7x weekly  Seated Isometric Shoulder External Rotation - 10 reps - 1 sets - 5 hold - 1x daily - 7x weekly  Isometric Shoulder Flexion with Ball at [place] - 10 reps - 1 sets - 5 hold - 1x daily - 7x weekly  Seated Isometric Shoulder Internal Rotation with Towel - 10 reps - 1 sets - 5 hold - 1x daily - 7x weekly  Standing Row with Anchored Resistance - 10 reps - 1 sets - 2 hold - 2x daily - 7x weekly  Supine Shoulder External Rotation with Resistance - 5 reps - 2 sets - 2 hold - 2x daily - 7x weekly  Standing Alternating Shoulder Extension with Resistance - 10 reps - 2 sets - 2x daily - 7x weekly  Prone Shoulder Row - 10 reps - 1 sets - 1x daily - 7x weekly  Prone Single Arm Shoulder Extension - 10 reps - 1 sets - 1x daily - 7x weekly  Standing Diagonal Shoulder Extension with Anchored Resistance - 10 reps - 1 sets - 1x daily - 7x weekly

## 2018-07-04 ENCOUNTER — Encounter: Payer: Self-pay | Admitting: Physical Therapy

## 2018-07-04 ENCOUNTER — Ambulatory Visit: Payer: BLUE CROSS/BLUE SHIELD | Admitting: Internal Medicine

## 2018-07-04 ENCOUNTER — Encounter: Payer: Self-pay | Admitting: Internal Medicine

## 2018-07-04 ENCOUNTER — Other Ambulatory Visit (INDEPENDENT_AMBULATORY_CARE_PROVIDER_SITE_OTHER): Payer: BLUE CROSS/BLUE SHIELD

## 2018-07-04 ENCOUNTER — Ambulatory Visit: Payer: BLUE CROSS/BLUE SHIELD | Admitting: Physical Therapy

## 2018-07-04 VITALS — BP 150/90 | HR 91 | Temp 97.7°F | Ht 63.5 in | Wt 165.0 lb

## 2018-07-04 DIAGNOSIS — R1030 Lower abdominal pain, unspecified: Secondary | ICD-10-CM

## 2018-07-04 DIAGNOSIS — M6281 Muscle weakness (generalized): Secondary | ICD-10-CM | POA: Diagnosis not present

## 2018-07-04 DIAGNOSIS — M25611 Stiffness of right shoulder, not elsewhere classified: Secondary | ICD-10-CM | POA: Diagnosis not present

## 2018-07-04 DIAGNOSIS — M25512 Pain in left shoulder: Secondary | ICD-10-CM | POA: Diagnosis not present

## 2018-07-04 DIAGNOSIS — F32A Depression, unspecified: Secondary | ICD-10-CM

## 2018-07-04 DIAGNOSIS — M25511 Pain in right shoulder: Secondary | ICD-10-CM | POA: Diagnosis not present

## 2018-07-04 DIAGNOSIS — R3915 Urgency of urination: Secondary | ICD-10-CM | POA: Diagnosis not present

## 2018-07-04 DIAGNOSIS — F329 Major depressive disorder, single episode, unspecified: Secondary | ICD-10-CM | POA: Diagnosis not present

## 2018-07-04 DIAGNOSIS — G8929 Other chronic pain: Secondary | ICD-10-CM | POA: Diagnosis not present

## 2018-07-04 DIAGNOSIS — R109 Unspecified abdominal pain: Secondary | ICD-10-CM | POA: Insufficient documentation

## 2018-07-04 LAB — URINALYSIS, ROUTINE W REFLEX MICROSCOPIC
Bilirubin Urine: NEGATIVE
HGB URINE DIPSTICK: NEGATIVE
Ketones, ur: NEGATIVE
LEUKOCYTES UA: NEGATIVE
NITRITE: NEGATIVE
RBC / HPF: NONE SEEN (ref 0–?)
Specific Gravity, Urine: <=1.005 — AB (ref 1.000–1.030)
Total Protein, Urine: NEGATIVE
Urine Glucose: NEGATIVE
Urobilinogen, UA: 0.2 (ref 0.0–1.0)
WBC, UA: NONE SEEN (ref 0–?)
pH: 6.5 (ref 5.0–8.0)

## 2018-07-04 MED ORDER — LEVOFLOXACIN 500 MG PO TABS: 500.0000 mg | ORAL_TABLET | Freq: Every day | ORAL | 0 refills | Status: AC

## 2018-07-04 MED ORDER — CIPROFLOXACIN HCL 500 MG PO TABS: 500.0000 mg | ORAL_TABLET | Freq: Two times a day (BID) | ORAL | 0 refills | Status: AC

## 2018-07-04 MED ORDER — METRONIDAZOLE 250 MG PO TABS: 250.0000 mg | ORAL_TABLET | Freq: Three times a day (TID) | ORAL | 0 refills | Status: AC

## 2018-07-04 NOTE — Therapy (Signed)
[facility] [address] [place], [place], [address] Phone: [phone]   Fax:  [phone]  Physical Therapy Treatment  Patient Details  Name: Jean Smith MRN: [id number] Date of Birth: February 19, 1951 Referring Provider (PT): Dr. Pricilla Holm   Encounter Date: 07/04/2018  PT End of Session - 07/04/18 1109    Visit Number  9    Date for PT Re-Evaluation  07/14/18    Authorization Type  BCBS 30 visit limit    PT Start Time  1106   pt late   PT Stop Time  1145    PT Time Calculation (min)  39 min    Activity Tolerance  Patient tolerated treatment well       Past Medical History:  Diagnosis Date  . Allergy   . Anxiety   . Cataract    bilateral - MD is just watching   . Diverticulosis   . Fuchs' endothelial dystrophy    follows with optho regularly   . Gestational diabetes   . History of depression   . HSV-2 infection   . Hyperlipidemia    no meds - diet controlled  . Hyperplastic colon polyp   . Mitral valve prolapse   . PVC's (premature ventricular contractions)    intol of BB, sxc palpitations r/t stress  . RVOT ventricular tachycardia/PVCs    EP eval 01/2013 for freq PVCs    Past Surgical History:  Procedure Laterality Date  . CESAREAN SECTION     x 3  . KNEE SURGERY Right   . MANDIBLE SURGERY     right side in front of ear  . WISDOM TOOTH EXTRACTION      There were no vitals filed for this visit.  Subjective Assessment - 07/04/18 1107    Subjective  I have some low back pain that has moved to my abdomen.  I thought maybe the PT was causing it but now I think it's a UTI.    I'm going to see my PCP this afternoon.  I felt my left shoulder a little this morning.      Pertinent History  "Carilyn"    Currently in Pain?  No/denies    Pain Score  0-No pain    Pain Orientation  Left    Pain Type  Chronic pain                       [facility] Adult PT Treatment/Exercise - 07/04/18  0001      Shoulder Exercises: Supine   Protraction  Right;Left;10 reps;Weights    Protraction Weight (lbs)  2    Other Supine Exercises  Circles 10x clock and counter clockwise right/left       Shoulder Exercises: Standing   External Rotation  Strengthening;Right;Left;15 reps;Theraband    Theraband Level (Shoulder External Rotation)  Level 2 (Red)    Other Standing Exercises  towel slides on wall 10x right/left scaption       Shoulder Exercises: ROM/Strengthening   UBE (Upper Arm Bike)  3 min forward/backward L2    Wall Pushups  15 reps    Wall Pushups Limitations  nose to hand alternating sides      Shoulder Exercises: Power Development worker, community  25 reps    Extension Limitations  15#    Row  20 reps    Row Limitations  20#               PT  Short Term Goals - 06/23/18 1126      PT SHORT TERM GOAL #1   Title  The patient will demonstrate compliance with initial HEP and self care strategies    Status  Achieved      PT SHORT TERM GOAL #2   Title  The patient will have improved shoulder ROM for flexion/elevation to 150 degrees on right and 160 degrees on left needed for putting on a pullover top    Status  Achieved      PT SHORT TERM GOAL #3   Title  The patient will report a 30% improvement in bilateral shoulder pain with dressing and household ADLs    Status  Achieved        PT Long Term Goals - 06/23/18 1127      PT LONG TERM GOAL #1   Title  The patient will be independent with safe self progression of HEP needed for further improvements in pain and function    Time  8    Period  Weeks    Status  On-going      PT LONG TERM GOAL #2   Title  The patient will have improved right shoulder strength to grossly 4-/5 and left shoulder to 4/5 needed for lifting her tea kettle.      Time  8    Period  Weeks    Status  On-going      PT LONG TERM GOAL #3   Title  The patient will report a 50% improvement in overall function including dressing, household ADLs and  driving    Status  Achieved      PT LONG TERM GOAL #4   Title  FOTO functional outcome score improved from 62% limitation to 38% limitation     Time  8    Period  Weeks    Status  On-going            Plan - 07/04/18 1649    Clinical Impression Statement  The patient reports none of the exercises she did today affected her back pain.  She is able to do a moderate level of scapular and glenohumeral strengthening ex's without complaints of shoulder pain but she does report muscular fatigue.  Fewer cues needed to avoid compensatory shoulder shrug.  Therapist closely monitoring response with all treatment interventions.      Rehab Potential  Good    PT Frequency  2x / week    PT Duration  8 weeks    PT Treatment/Interventions  ADLs/Self Care Home Management;Cryotherapy;Electrical Stimulation;Ultrasound;Moist Heat;Iontophoresis 4mg /ml Dexamethasone;Neuromuscular re-education;Therapeutic activities;Therapeutic exercise;Manual techniques;Taping;Dry needling    PT Next Visit Plan  10th visit progress note next visit;  recheck ROM and MMT;  FOTO;  hold ionto;  KT tape;  glenohumeral and scapular strengthening;   UBE    PT Home Exercise Plan   Access Code: [email or web address]        Patient will benefit from skilled therapeutic intervention in order to improve the following deficits and impairments:  Pain, Decreased range of motion, Decreased strength, Decreased activity tolerance, Impaired UE functional use  Visit Diagnosis: Chronic right shoulder pain  Chronic left shoulder pain  Muscle weakness (generalized)  Stiffness of right shoulder, not elsewhere classified     Problem List Patient Active Problem List   Diagnosis Date Noted  . Urinary urgency 05/05/2018  . Chronic right shoulder pain 05/05/2018  . Vitamin D deficiency 05/05/2018  . Other fatigue 01/02/2018  . Allergic rhinitis 01/02/2018  .  Weight gain 01/02/2018  . PVC (premature ventricular contraction) 03/09/2017  . Routine  general medical examination at a health care facility 09/01/2015  . Hyperlipidemia 09/01/2015  . Varicose vein 08/27/2014  . Primary localized osteoarthrosis, lower leg 10/30/2013  . Anxiety state 03/23/2013  . 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 Depression 01/01/2013  . Abnormal stress echo 12/11/2012  . Palpitations 09/14/2010   Ruben Im, PT 07/04/18 4:55 PM Phone: [phone] Fax: [phone]  Alvera Singh 07/04/2018, 4:54 PM  [facility] [address], [place], [place], [address] Phone: [phone]   Fax:  [phone]  Name: Jean Smith MRN: [id number] Date of Birth: March 21, 1951

## 2018-07-04 NOTE — Progress Notes (Signed)
Subjective:    Patient ID: Jean Smith, female    DOB: May 11, 1951, 68 y.o.   MRN: [id number]  HPI  Here to f/u with acute visit, PCP Dr Sharlet Salina, with c/o 2-3 days onset urinary urgency, low abd pain, radiating to the back pain, but Denies urinary symptoms such as dysuria, frequency, flank pain, hematuria or n/v, fever, chills.  Seems somewhat similar to her as a previous episode of acute diverticulitis.   Getting PT for bilateral shoulder  Just getting over an URI illness going around the famiy, no better, had dayquil for 2 wk, wondering if this is causing her elevated blood pressure.  Held off on PT during the URI, but then restarted.  Originally  had acute onset lower back pain thought maybe from the PT excercises, but then seemed to worsen with pain to the lower abd as well, and PT after that did not make the pain worse.  Pt denies chest pain, increased sob or doe, wheezing, orthopnea, PND, increased LE swelling, palpitations, dizziness or syncope.  Denies worsening depressive symptoms, suicidal ideation, or panic; has ongoing anxiety, Past Medical History:  Diagnosis Date  . Allergy   . Anxiety   . Cataract    bilateral - MD is just watching   . Diverticulosis   . Fuchs' endothelial dystrophy    follows with optho regularly   . Gestational diabetes   . History of depression   . HSV-2 infection   . Hyperlipidemia    no meds - diet controlled  . Hyperplastic colon polyp   . Mitral valve prolapse   . PVC's (premature ventricular contractions)    intol of BB, sxc palpitations r/t stress  . RVOT ventricular tachycardia/PVCs    EP eval 01/2013 for freq PVCs   Past Surgical History:  Procedure Laterality Date  . CESAREAN SECTION     x 3  . KNEE SURGERY Right   . MANDIBLE SURGERY     right side in front of ear  . WISDOM TOOTH EXTRACTION      reports that she quit smoking about 35 years ago. Her smoking use included cigarettes. She has never used smokeless tobacco. She reports  current alcohol use. She reports that she does not use drugs. family history includes Alcohol abuse in an other family member; Colon cancer in her maternal grandmother and mother; Colon polyps in her sister; Heart disease in her father. Allergies  Allergen Reactions  . Atorvastatin Other (See Comments)    Sensitive   . Crestor [Rosuvastatin Calcium]     Muscle ache  . Latex Swelling  . Lipitor [Atorvastatin Calcium]     Muscle ache  . Other     Grass, local trees, cats and dogs  . Pollen Extract   . Vitamin D Analogs     Skin Rash   Current Outpatient Medications on File Prior to Visit  Medication Sig Dispense Refill  . ALPRAZolam (XANAX) 0.25 MG tablet TAKE 1 TABLET BY MOUTH 3 TIMES A DAY 90 tablet 3  . ibuprofen (ADVIL,MOTRIN) 200 MG tablet Take 200 mg by mouth 2 (two) times daily as needed.    . loratadine (CLARITIN) 10 MG tablet Take 10 mg by mouth daily as needed for allergies.     . Magnesium Bisglycinate Dihyd POWD Take 1 tablet by mouth 2 (two) times daily.     . Multiple Vitamin (MULTIVITAMIN) tablet Take 1 tablet by mouth daily.    . propranolol (INDERAL) 10 MG tablet Take 1  tablet (10 mg total) by mouth daily. As needed for palpitations. Please make overdue appt with Dr. Acie Fredrickson. 3rd and final attempt 15 tablet 0   No current facility-administered medications on file prior to visit.     Review of Systems  Constitutional: Negative for other unusual diaphoresis or sweats HENT: Negative for ear discharge or swelling Eyes: Negative for other worsening visual disturbances Respiratory: Negative for stridor or other swelling  Gastrointestinal: Negative for worsening distension or other blood Genitourinary: Negative for retention or other urinary change Musculoskeletal: Negative for other MSK pain or swelling Skin: Negative for color change or other new lesions Neurological: Negative for worsening tremors and other numbness  Psychiatric/Behavioral: Negative for worsening  agitation or other fatigue ALl other system neg per pt    Objective:   Physical Exam BP (!) 150/90 (BP Location: Left Arm, Patient Position: Sitting, Cuff Size: Normal)   Pulse 91   Temp 97.7 F (36.5 C) (Oral)   Ht 5' 3.5" (1.613 m)   Wt 165 lb (74.8 kg)   SpO2 98%   BMI 28.77 kg/m  VS noted, mild ill Constitutional: Pt appears in NAD HENT: Head: NCAT.  Right Ear: External ear normal.  Left Ear: External ear normal.  Eyes: . Pupils are equal, round, and reactive to light. Conjunctivae and EOM are normal Nose: without d/c or deformity Neck: Neck supple. Gross normal ROM Cardiovascular: Normal rate and regular rhythm.   Pulmonary/Chest: Effort normal and breath sounds without rales or wheezing.  Abd:  Soft, ND, + BS, no organomegaly, low mid abd tender, without guarding or rebound Neurological: Pt is alert. At baseline orientation, motor grossly intact Skin: Skin is warm. No rashes, other new lesions, no LE edema Psychiatric: Pt behavior is normal without agitation , not depressed affect, mild nervous No other exam findings Lab Results  Component Value Date   WBC 5.6 01/02/2018   HGB 13.6 01/02/2018   HCT 40.4 01/02/2018   PLT 250.0 01/02/2018   GLUCOSE 95 05/04/2018   CHOL 256 (H) 01/02/2018   TRIG 246.0 (H) 01/02/2018   HDL 60.40 01/02/2018   LDLDIRECT 168.0 01/02/2018   LDLCALC 188 (H) 09/01/2015   ALT 14 05/04/2018   AST 17 05/04/2018   NA 140 05/04/2018   K 4.3 05/04/2018   CL 102 05/04/2018   CREATININE 0.95 05/04/2018   BUN 17 05/04/2018   CO2 30 05/04/2018   TSH 1.19 01/02/2018   HGBA1C 6.4 01/02/2018       Assessment & Plan:

## 2018-07-04 NOTE — Patient Instructions (Addendum)
Please take all new medication as prescribed - the levaquin and flagyl  We may be able to stop the flagyl if the urine proves to have infection, instead of diverticulitis  Please continue all other medications as before, and refills have been done if requested.  Please have the pharmacy call with any other refills you may need.  Please continue your efforts at being more active, low cholesterol diet, and weight control.  Please keep your appointments with your specialists as you may have planned  Please go to the LAB in the Basement (turn left off the elevator) for the tests to be done today - just the urine testing today  Left message on MyChart, pt to cont same tx  Please remember to sign up for MyChart if you have not done so, as this will be important to you in the future with finding out test results, communicating by private email, and scheduling acute appointments online when needed.

## 2018-07-04 NOTE — Assessment & Plan Note (Signed)
?   UTI - for urine studies

## 2018-07-04 NOTE — Assessment & Plan Note (Signed)
stable overall by history and exam, recent data reviewed with pt, and pt to continue medical treatment as before,  to f/u any worsening symptoms or concerns  

## 2018-07-04 NOTE — Assessment & Plan Note (Signed)
Also cannot rule out diverticulitis, start empiric levaquin/flagyl but consider stop the flagyl if urine cx abnormal

## 2018-07-05 LAB — URINE CULTURE
MICRO NUMBER:: 83143
SPECIMEN QUALITY:: ADEQUATE

## 2018-07-07 ENCOUNTER — Ambulatory Visit: Payer: BLUE CROSS/BLUE SHIELD | Admitting: Physical Therapy

## 2018-07-10 ENCOUNTER — Ambulatory Visit: Payer: BLUE CROSS/BLUE SHIELD | Admitting: Physical Therapy

## 2018-07-10 ENCOUNTER — Encounter: Payer: Self-pay | Admitting: Physical Therapy

## 2018-07-10 DIAGNOSIS — M25512 Pain in left shoulder: Secondary | ICD-10-CM

## 2018-07-10 DIAGNOSIS — M6281 Muscle weakness (generalized): Secondary | ICD-10-CM

## 2018-07-10 DIAGNOSIS — M25511 Pain in right shoulder: Secondary | ICD-10-CM | POA: Diagnosis not present

## 2018-07-10 DIAGNOSIS — M25611 Stiffness of right shoulder, not elsewhere classified: Secondary | ICD-10-CM | POA: Diagnosis not present

## 2018-07-10 DIAGNOSIS — G8929 Other chronic pain: Secondary | ICD-10-CM

## 2018-07-10 NOTE — Therapy (Signed)
[facility] [address], [place], [place], [address] Phone: [phone]   Fax:  [phone]  Physical Therapy Treatment  Patient Details  Name: Jean Smith MRN: [id number] Date of Birth: 24-Feb-1951 Referring Provider (PT): Dr. Pricilla Holm   Encounter Date: 07/10/2018  PT End of Session - 07/10/18 1019    Visit Number  10    Date for PT Re-Evaluation  07/14/18    Authorization Type  BCBS 30 visit limit    PT Start Time  1019    PT Stop Time  1100    PT Time Calculation (min)  41 min    Activity Tolerance  Patient tolerated treatment well    Behavior During Therapy  [facility] for tasks assessed/performed       Past Medical History:  Diagnosis Date  . Allergy   . Anxiety   . Cataract    bilateral - MD is just watching   . Diverticulosis   . Fuchs' endothelial dystrophy    follows with optho regularly   . Gestational diabetes   . History of depression   . HSV-2 infection   . Hyperlipidemia    no meds - diet controlled  . Hyperplastic colon polyp   . Mitral valve prolapse   . PVC's (premature ventricular contractions)    intol of BB, sxc palpitations r/t stress  . RVOT ventricular tachycardia/PVCs    EP eval 01/2013 for freq PVCs    Past Surgical History:  Procedure Laterality Date  . CESAREAN SECTION     x 3  . KNEE SURGERY Right   . MANDIBLE SURGERY     right side in front of ear  . WISDOM TOOTH EXTRACTION      There were no vitals filed for this visit.  Subjective Assessment - 07/10/18 1021    Subjective  Not sure why but this AM I had some increased pain where I had to take an Advil. Still not as bad as it was. My back pain was coming from my colon. I saw the MD and I have inflammation in my colon.     Pertinent History  "[place]de[place]"    Currently in Pain?  Yes    Pain Score  2     Pain Location  Shoulder    Pain Orientation  Right;Left   Lt shoulder felt sharp this AM    Pain Descriptors /  Indicators  Dull;Aching    Aggravating Factors   washing hair , doing something with her arms away from her can be difficult    Pain Relieving Factors  exercises, arm bike    Multiple Pain Sites  No         [facility] PT Assessment - 07/10/18 0001      Observation/Other Assessments   Focus on Therapeutic Outcomes (FOTO)   43% limited      AROM   Right Shoulder Flexion  170 Degrees    Right Shoulder ABduction  160 Degrees    Right Shoulder External Rotation  85 Degrees    Left Shoulder Flexion  175 Degrees    Left Shoulder ABduction  172 Degrees    Left Shoulder External Rotation  85 Degrees      Strength   Right Shoulder Flexion  4+/5    Right Shoulder Extension  4/5    Right Shoulder ABduction  4/5    Right Shoulder Internal Rotation  4/5    Right Shoulder External Rotation  4/5  Left Shoulder Flexion  4/5    Left Shoulder Extension  4/5    Left Shoulder ABduction  4/5    Left Shoulder Internal Rotation  4/5    Left Shoulder External Rotation  4/5                   [facility] Adult PT Treatment/Exercise - 07/10/18 0001      Shoulder Exercises: Prone   Other Prone Exercises  Standing bent over at counter 1# 10x, extensions 1#10   Bil     Shoulder Exercises: ROM/Strengthening   UBE (Upper Arm Bike)  3 min forward/backward L2               PT Short Term Goals - 06/23/18 1126      PT SHORT TERM GOAL #1   Title  The patient will demonstrate compliance with initial HEP and self care strategies    Status  Achieved      PT SHORT TERM GOAL #2   Title  The patient will have improved shoulder ROM for flexion/elevation to 150 degrees on right and 160 degrees on left needed for putting on a pullover top    Status  Achieved      PT SHORT TERM GOAL #3   Title  The patient will report a 30% improvement in bilateral shoulder pain with dressing and household ADLs    Status  Achieved        PT Long Term Goals - 07/10/18 1049      PT LONG TERM GOAL #1   Title   The patient will be independent with safe self progression of HEP needed for further improvements in pain and function    Time  8    Period  Weeks    Status  On-going      PT LONG TERM GOAL #2   Title  The patient will have improved right shoulder strength to grossly 4-/5 and left shoulder to 4/5 needed for lifting her tea kettle.      Time  8    Period  Weeks    Status  Achieved      PT LONG TERM GOAL #3   Title  The patient will report a 50% improvement in overall function including dressing, household ADLs and driving    Time  8    Period  Weeks    Status  Achieved      PT LONG TERM GOAL #4   Title  FOTO functional outcome score improved from 62% limitation to 38% limitation     Time  8    Period  Weeks    Status  On-going   43% today           Plan - 07/10/18 1025    Clinical Impression Statement  Pt FOTO score about the same as last visit. Pt's MMT for Bil shoulders improved by about half a grade, meeting LTG. RT shoulder AROM also improved. Pt reports her LBP was coming from colon inflamation ( she saw MD for this.) She was not doing her prone exercises as the position was not comfortable for her. PTA showed her how to perform standing bent over the counter. This was better, performing 10x Bil rows & extensions with 1# and no complaints of pain.    Rehab Potential  Good    PT Frequency  2x / week    PT Duration  8 weeks    PT Treatment/Interventions  ADLs/Self Care Home Management;Cryotherapy;Dealer  Stimulation;Ultrasound;Moist Heat;Iontophoresis 4mg /ml Dexamethasone;Neuromuscular re-education;Therapeutic activities;Therapeutic exercise;Manual techniques;Taping;Dry needling    PT Next Visit Plan  Discuss discharge: review HEP    PT Home Exercise Plan   Access Code: [email or web address]     Consulted and Agree with Plan of Care  Patient       Patient will benefit from skilled therapeutic intervention in order to improve the following deficits and impairments:  Pain, Decreased  range of motion, Decreased strength, Decreased activity tolerance, Impaired UE functional use  Visit Diagnosis: Chronic right shoulder pain  Chronic left shoulder pain  Muscle weakness (generalized)  Stiffness of right shoulder, not elsewhere classified     Problem List Patient Active Problem List   Diagnosis Date Noted  . Abdominal pain 07/04/2018  . Urinary urgency 05/05/2018  . Chronic right shoulder pain 05/05/2018  . Vitamin D deficiency 05/05/2018  . Other fatigue 01/02/2018  . Allergic rhinitis 01/02/2018  . Weight gain 01/02/2018  . PVC (premature ventricular contraction) 03/09/2017  . Routine general medical examination at a health care facility 09/01/2015  . Hyperlipidemia 09/01/2015  . Varicose vein 08/27/2014  . Primary localized osteoarthrosis, lower leg 10/30/2013  . Anxiety state 03/23/2013  . 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 Depression 01/01/2013  . Abnormal stress echo 12/11/2012  . Palpitations 09/14/2010    Vaughn Beaumier, PTA 07/10/2018, 11:41 AM  [facility] [address], [place], [place], [address] Phone: [phone]   Fax:  [phone]  Name: NHU GLASBY MRN: [id number] Date of Birth: 03-24-1951

## 2018-07-11 ENCOUNTER — Encounter: Payer: BLUE CROSS/BLUE SHIELD | Admitting: Physical Therapy

## 2018-07-13 DIAGNOSIS — F4323 Adjustment disorder with mixed anxiety and depressed mood: Secondary | ICD-10-CM | POA: Diagnosis not present

## 2018-07-14 ENCOUNTER — Encounter: Payer: Self-pay | Admitting: Physical Therapy

## 2018-07-14 ENCOUNTER — Ambulatory Visit: Payer: BLUE CROSS/BLUE SHIELD | Admitting: Physical Therapy

## 2018-07-14 DIAGNOSIS — M6281 Muscle weakness (generalized): Secondary | ICD-10-CM | POA: Diagnosis not present

## 2018-07-14 DIAGNOSIS — M25512 Pain in left shoulder: Secondary | ICD-10-CM

## 2018-07-14 DIAGNOSIS — M25511 Pain in right shoulder: Secondary | ICD-10-CM | POA: Diagnosis not present

## 2018-07-14 DIAGNOSIS — M25611 Stiffness of right shoulder, not elsewhere classified: Secondary | ICD-10-CM

## 2018-07-14 DIAGNOSIS — G8929 Other chronic pain: Secondary | ICD-10-CM

## 2018-07-14 NOTE — Therapy (Signed)
[facility] [address] [place], [place], [address] Phone: [phone]   Fax:  [phone]  Physical Therapy Treatment  Patient Details  Name: Jean Smith MRN: [id number] Date of Birth: 08/02/1950 Referring Provider (PT): Dr. Pricilla Holm   Encounter Date: 07/14/2018  PT End of Session - 07/14/18 1154    Visit Number  11    Date for PT Re-Evaluation  08/25/18    Authorization Type  BCBS 30 visit limit    PT Start Time  1106    PT Stop Time  1146    PT Time Calculation (min)  40 min    Activity Tolerance  Patient tolerated treatment well       Past Medical History:  Diagnosis Date  . Allergy   . Anxiety   . Cataract    bilateral - MD is just watching   . Diverticulosis   . Fuchs' endothelial dystrophy    follows with optho regularly   . Gestational diabetes   . History of depression   . HSV-2 infection   . Hyperlipidemia    no meds - diet controlled  . Hyperplastic colon polyp   . Mitral valve prolapse   . PVC's (premature ventricular contractions)    intol of BB, sxc palpitations r/t stress  . RVOT ventricular tachycardia/PVCs    EP eval 01/2013 for freq PVCs    Past Surgical History:  Procedure Laterality Date  . CESAREAN SECTION     x 3  . KNEE SURGERY Right   . MANDIBLE SURGERY     right side in front of ear  . WISDOM TOOTH EXTRACTION      There were no vitals filed for this visit.  Subjective Assessment - 07/14/18 1106    Subjective  I woke up at 4 am after last session and I was so sore in my upper arms.  I think I overdid it.      Pertinent History  "Jean Smith"    Currently in Pain?  Yes    Pain Score  1     Pain Location  Shoulder    Pain Orientation  Left;Right    Pain Type  Chronic pain    Aggravating Factors   reaching out to the side;  picking up the dog, reaching into the dog's cage         [facility] PT Assessment - 07/14/18 0001      Assessment   Medical Diagnosis   chronic right shoulder pain     Referring Provider (PT)  Dr. Pricilla Holm      Observation/Other Assessments   Focus on Therapeutic Outcomes (FOTO)   43% limited      AROM   Right Shoulder Flexion  170 Degrees    Right Shoulder ABduction  160 Degrees    Right Shoulder External Rotation  85 Degrees    Left Shoulder Flexion  175 Degrees    Left Shoulder ABduction  172 Degrees    Left Shoulder External Rotation  85 Degrees      Strength   Right Shoulder Flexion  4+/5    Right Shoulder Extension  4/5    Right Shoulder ABduction  4/5    Right Shoulder Internal Rotation  4/5    Right Shoulder External Rotation  4/5    Left Shoulder Flexion  4/5    Left Shoulder Extension  4/5    Left Shoulder ABduction  4/5    Left Shoulder Internal Rotation  4/5    Left Shoulder External Rotation  4/5                   [facility] Adult PT Treatment/Exercise - 07/14/18 0001      Shoulder Exercises: Supine   Protraction  Strengthening;Right;Left    Protraction Weight (lbs)  1    Protraction Limitations  on wedge     Other Supine Exercises  on wedge 1# 12/6:00; and 9:00/6 10x each       Shoulder Exercises: Sidelying   Other Sidelying Exercises  1# 12/6:00; 9:00/3:00 10x each right/left       Shoulder Exercises: Standing   Other Standing Exercises  wall walk with yellow band around wrists 10x      Shoulder Exercises: ROM/Strengthening   UBE (Upper Arm Bike)  3 min forward/backward L2    Wall Pushups  15 reps               PT Short Term Goals - 06/23/18 1126      PT SHORT TERM GOAL #1   Title  The patient will demonstrate compliance with initial HEP and self care strategies    Status  Achieved      PT SHORT TERM GOAL #2   Title  The patient will have improved shoulder ROM for flexion/elevation to 150 degrees on right and 160 degrees on left needed for putting on a pullover top    Status  Achieved      PT SHORT TERM GOAL #3   Title  The patient will report a 30%  improvement in bilateral shoulder pain with dressing and household ADLs    Status  Achieved        PT Long Term Goals - 07/14/18 1131      PT LONG TERM GOAL #1   Title  The patient will be independent with safe self progression of HEP needed for further improvements in pain and function    Time  6    Period  Weeks    Status  On-going    Target Date  08/25/18      PT LONG TERM GOAL #2   Title  The patient will have improved right shoulder strength to grossly 4/5 and left shoulder to 4+/5 needed for lifting household objects    Time  6    Status  Revised      PT LONG TERM GOAL #3   Title  The patient will report a 60% improvement in overall function including dressing, household ADLs and driving    Time  6    Period  Weeks    Status  Revised      PT LONG TERM GOAL #4   Title  FOTO functional outcome score improved from 62% limitation to 38% limitation     Time  6    Period  Weeks    Status  On-going      PT LONG TERM GOAL #5   Title  The patient will be able to lift a 2# object into the cabinets    Time  6    Period  Weeks    Status  New            Plan - 07/14/18 1155    Clinical Impression Statement  The patient has progressed well with ROM and strength continues to improve.   She does complain of continued bil shoulder pain with lifting her older dog and reaching out to the side.  Her  symptoms are easily exacerbated and she is fearful of movement.  She has many psychosocial issues which is known to exacerbate the pain cycle.   She would benefit from continued PT in order to progress at a slower pace and to promote indepdence with a HEP.  Will decrease treatment frequency to 1x/week.      Rehab Potential  Good    PT Frequency  1x / week    PT Duration  6 weeks    PT Treatment/Interventions  ADLs/Self Care Home Management;Cryotherapy;Electrical Stimulation;Ultrasound;Moist Heat;Iontophoresis 4mg /ml Dexamethasone;Neuromuscular re-education;Therapeutic  activities;Therapeutic exercise;Manual techniques;Taping;Dry needling    PT Next Visit Plan  glenohumeral and scapular strengthening low level within pain limits and low resistance    PT Home Exercise Plan   Access Code: [email or web address]        Patient will benefit from skilled therapeutic intervention in order to improve the following deficits and impairments:  Pain, Decreased range of motion, Decreased strength, Decreased activity tolerance, Impaired UE functional use  Visit Diagnosis: Chronic right shoulder pain - Plan: PT plan of care cert/re-cert  Chronic left shoulder pain - Plan: PT plan of care cert/re-cert  Muscle weakness (generalized) - Plan: PT plan of care cert/re-cert  Stiffness of right shoulder, not elsewhere classified - Plan: PT plan of care cert/re-cert     Problem List Patient Active Problem List   Diagnosis Date Noted  . Abdominal pain 07/04/2018  . Urinary urgency 05/05/2018  . Chronic right shoulder pain 05/05/2018  . Vitamin D deficiency 05/05/2018  . Other fatigue 01/02/2018  . Allergic rhinitis 01/02/2018  . Weight gain 01/02/2018  . PVC (premature ventricular contraction) 03/09/2017  . Routine general medical examination at a health care facility 09/01/2015  . Hyperlipidemia 09/01/2015  . Varicose vein 08/27/2014  . Primary localized osteoarthrosis, lower leg 10/30/2013  . Anxiety state 03/23/2013  . 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 Depression 01/01/2013  . Abnormal stress echo 12/11/2012  . Palpitations 09/14/2010   Ruben Im, PT 07/14/18 12:11 PM Phone: [phone] Fax: [phone]  Alvera Singh 07/14/2018, 12:10 PM  [facility] [address] [place], [place], [address] Phone: [phone]   Fax:  [phone]  Name: Jean Smith MRN: [id number] Date of Birth: 27-Nov-1950

## 2018-07-18 DIAGNOSIS — F4323 Adjustment disorder with mixed anxiety and depressed mood: Secondary | ICD-10-CM | POA: Diagnosis not present

## 2018-07-19 ENCOUNTER — Ambulatory Visit: Payer: BLUE CROSS/BLUE SHIELD | Attending: Internal Medicine | Admitting: Physical Therapy

## 2018-07-19 ENCOUNTER — Encounter: Payer: Self-pay | Admitting: Physical Therapy

## 2018-07-19 DIAGNOSIS — G8929 Other chronic pain: Secondary | ICD-10-CM

## 2018-07-19 DIAGNOSIS — M25611 Stiffness of right shoulder, not elsewhere classified: Secondary | ICD-10-CM

## 2018-07-19 DIAGNOSIS — M25511 Pain in right shoulder: Secondary | ICD-10-CM | POA: Diagnosis not present

## 2018-07-19 DIAGNOSIS — M25512 Pain in left shoulder: Secondary | ICD-10-CM | POA: Diagnosis not present

## 2018-07-19 DIAGNOSIS — M6281 Muscle weakness (generalized): Secondary | ICD-10-CM | POA: Insufficient documentation

## 2018-07-19 NOTE — Therapy (Signed)
[facility] [address], [place], [place], [address] Phone: [phone]   Fax:  [phone]  Physical Therapy Treatment  Patient Details  Name: Jean Smith MRN: [id number] Date of Birth: [birth year] Referring Provider (PT): Dr. Pricilla Holm   Encounter Date: 07/19/2018  PT End of Session - 07/19/18 1112    Visit Number  12    Date for PT Re-Evaluation  08/25/18    Authorization Type  BCBS 30 visit limit    PT Start Time  1110   10 min late   PT Stop Time  1148    PT Time Calculation (min)  38 min    Activity Tolerance  Patient tolerated treatment well    Behavior During Therapy  [facility] for tasks assessed/performed       Past Medical History:  Diagnosis Date  . Allergy   . Anxiety   . Cataract    bilateral - MD is just watching   . Diverticulosis   . Fuchs' endothelial dystrophy    follows with optho regularly   . Gestational diabetes   . History of depression   . HSV-2 infection   . Hyperlipidemia    no meds - diet controlled  . Hyperplastic colon polyp   . Mitral valve prolapse   . PVC's (premature ventricular contractions)    intol of BB, sxc palpitations r/t stress  . RVOT ventricular tachycardia/PVCs    EP eval 01/2013 for freq PVCs    Past Surgical History:  Procedure Laterality Date  . CESAREAN SECTION     x 3  . KNEE SURGERY Right   . MANDIBLE SURGERY     right side in front of ear  . WISDOM TOOTH EXTRACTION      There were no vitals filed for this visit.  Subjective Assessment - 07/19/18 1114    Subjective  No new complaints. Pt preoccupied with husband potentially having the flu.    Currently in Pain?  Yes    Pain Score  2     Pain Location  Shoulder    Pain Orientation  Right    Pain Descriptors / Indicators  Dull;Aching    Multiple Pain Sites  No                       [facility] Adult PT Treatment/Exercise - 07/19/18 0001      Shoulder Exercises: Supine   Protraction  Strengthening;Both;10 reps;Weights   and retraction   Protraction Weight (lbs)  1    Other Supine Exercises  closed chain stabilization with 1#: circles 10x each direction, Cross 10x up & down, 10x side to side      Shoulder Exercises: Standing   Other Standing Exercises  bent over row 10x 1# bil    Other Standing Exercises  bent over shoulder extensions bil 1# 10x       Shoulder Exercises: ROM/Strengthening   UBE (Upper Arm Bike)  3 min forward/backward L3   PTA present for concurret review of staus   Wall Pushups  15 reps               PT Short Term Goals - 06/23/18 1126      PT SHORT TERM GOAL #1   Title  The patient will demonstrate compliance with initial HEP and self care strategies    Status  Achieved      PT SHORT TERM GOAL #2   Title  The patient  will have improved shoulder ROM for flexion/elevation to 150 degrees on right and 160 degrees on left needed for putting on a pullover top    Status  Achieved      PT SHORT TERM GOAL #3   Title  The patient will report a 30% improvement in bilateral shoulder pain with dressing and household ADLs    Status  Achieved        PT Long Term Goals - 07/14/18 1131      PT LONG TERM GOAL #1   Title  The patient will be independent with safe self progression of HEP needed for further improvements in pain and function    Time  6    Period  Weeks    Status  On-going    Target Date  08/25/18      PT LONG TERM GOAL #2   Title  The patient will have improved right shoulder strength to grossly 4/5 and left shoulder to 4+/5 needed for lifting household objects    Time  6    Status  Revised      PT LONG TERM GOAL #3   Title  The patient will report a 60% improvement in overall function including dressing, household ADLs and driving    Time  6    Period  Weeks    Status  Revised      PT LONG TERM GOAL #4   Title  FOTO functional outcome score improved from 62% limitation to 38% limitation     Time  6     Period  Weeks    Status  On-going      PT LONG TERM GOAL #5   Title  The patient will be able to lift a 2# object into the cabinets    Time  6    Period  Weeks    Status  New            Plan - 07/19/18 1113    Clinical Impression Statement  Pt arrives to therapy with reports of low compliance with her exercises this past week. Pain not a limiting factor during treatment. Pt distracted initially by husbands current sickness but she eventually got back on task  to complete her exercises. PTA encouraged pt to work on her compliance this week with her current exercises.. she agreed.    Rehab Potential  Good    PT Frequency  1x / week    PT Duration  6 weeks    PT Treatment/Interventions  ADLs/Self Care Home Management;Cryotherapy;Electrical Stimulation;Ultrasound;Moist Heat;Iontophoresis 4mg /ml Dexamethasone;Neuromuscular re-education;Therapeutic activities;Therapeutic exercise;Manual techniques;Taping;Dry needling    PT Next Visit Plan  glenohumeral and scapular strengthening low level within pain limits and low resistance    PT Home Exercise Plan   Access Code: [email or web address]     Consulted and Agree with Plan of Care  Patient       Patient will benefit from skilled therapeutic intervention in order to improve the following deficits and impairments:  Pain, Decreased range of motion, Decreased strength, Decreased activity tolerance, Impaired UE functional use  Visit Diagnosis: Chronic right shoulder pain  Chronic left shoulder pain  Muscle weakness (generalized)  Stiffness of right shoulder, not elsewhere classified     Problem List Patient Active Problem List   Diagnosis Date Noted  . Abdominal pain 07/04/2018  . Urinary urgency 05/05/2018  . Chronic right shoulder pain 05/05/2018  . Vitamin D deficiency 05/05/2018  . Other fatigue 01/02/2018  . Allergic rhinitis 01/02/2018  .  Weight gain 01/02/2018  . PVC (premature ventricular contraction) 03/09/2017  . Routine general  medical examination at a health care facility 09/01/2015  . Hyperlipidemia 09/01/2015  . Varicose vein 08/27/2014  . Primary localized osteoarthrosis, lower leg 10/30/2013  . Anxiety state 03/23/2013  . 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 Depression 01/01/2013  . Abnormal stress echo 12/11/2012  . Palpitations 09/14/2010    Jean Smith, PTA 07/19/2018, 11:52 AM  [facility] [address] [place], [place], [address] Phone: [phone]   Fax:  [phone]  Name: Jean Smith MRN: [id number] Date of Birth: [birth year]

## 2018-07-25 ENCOUNTER — Encounter: Payer: Self-pay | Admitting: Physical Therapy

## 2018-07-25 ENCOUNTER — Ambulatory Visit: Payer: BLUE CROSS/BLUE SHIELD | Admitting: Physical Therapy

## 2018-07-25 DIAGNOSIS — M25512 Pain in left shoulder: Secondary | ICD-10-CM

## 2018-07-25 DIAGNOSIS — G8929 Other chronic pain: Secondary | ICD-10-CM

## 2018-07-25 DIAGNOSIS — M6281 Muscle weakness (generalized): Secondary | ICD-10-CM

## 2018-07-25 DIAGNOSIS — M25511 Pain in right shoulder: Principal | ICD-10-CM

## 2018-07-25 DIAGNOSIS — F4323 Adjustment disorder with mixed anxiety and depressed mood: Secondary | ICD-10-CM | POA: Diagnosis not present

## 2018-07-25 DIAGNOSIS — M25611 Stiffness of right shoulder, not elsewhere classified: Secondary | ICD-10-CM | POA: Diagnosis not present

## 2018-07-25 NOTE — Therapy (Signed)
[facility] [address], [place], [place], [address] Phone: [phone]   Fax:  [phone]  Physical Therapy Treatment  Patient Details  Name: Jean Smith MRN: [id number] Date of Birth: Jun 19, 1950 Referring Provider (PT): Dr. Pricilla Holm   Encounter Date: 07/25/2018  PT End of Session - 07/25/18 1058    Visit Number  13    Date for PT Re-Evaluation  08/25/18    Authorization Type  [employer] 30 visit limit    PT Start Time  1024    PT Stop Time  1102    PT Time Calculation (min)  38 min    Activity Tolerance  Patient tolerated treatment well       Past Medical History:  Diagnosis Date  . Allergy   . Anxiety   . Cataract    bilateral - MD is just watching   . Diverticulosis   . Fuchs' endothelial dystrophy    follows with optho regularly   . Gestational diabetes   . History of depression   . HSV-2 infection   . Hyperlipidemia    no meds - diet controlled  . Hyperplastic colon polyp   . Mitral valve prolapse   . PVC's (premature ventricular contractions)    intol of BB, sxc palpitations r/t stress  . RVOT ventricular tachycardia/PVCs    EP eval 01/2013 for freq PVCs    Past Surgical History:  Procedure Laterality Date  . CESAREAN SECTION     x 3  . KNEE SURGERY Right   . MANDIBLE SURGERY     right side in front of ear  . WISDOM TOOTH EXTRACTION      There were no vitals filed for this visit.  Subjective Assessment - 07/25/18 1025    Subjective  I hurt my shoulder lifting a 12 pack off the higher shelf at the grocery store 2 days ago.      Currently in Pain?  Yes    Pain Score  2     Pain Location  Shoulder    Pain Orientation  Right                       [facility] Adult PT Treatment/Exercise - 07/25/18 0001      Shoulder Exercises: Supine   Protraction  Strengthening;Right;Left;15 reps;Weights    Protraction Weight (lbs)  2    Other Supine Exercises  on wedge 2#  12/6:00; and 9:00/6 10x each       Shoulder Exercises: Standing   Row  Strengthening;Right;Left;10 reps;Theraband    Other Standing Exercises  bent over triceps extension 15x 2#    Other Standing Exercises  standing yellow       Shoulder Exercises: ROM/Strengthening   UBE (Upper Arm Bike)  3 min forward/backward L3   PTA present for concurret review of staus   Wall Pushups  15 reps    Wall Pushups Limitations  forearms to hands on wall     Other ROM/Strengthening Exercises  yellow band around wrists hands on wall walking  5x    Other ROM/Strengthening Exercises  yellow band around wrists side UE stepping on wall 5x                PT Short Term Goals - 06/23/18 1126      PT SHORT TERM GOAL #1   Title  The patient will demonstrate compliance with initial HEP and self care strategies    Status  Achieved      PT SHORT TERM GOAL #2   Title  The patient will have improved shoulder ROM for flexion/elevation to 150 degrees on right and 160 degrees on left needed for putting on a pullover top    Status  Achieved      PT SHORT TERM GOAL #3   Title  The patient will report a 30% improvement in bilateral shoulder pain with dressing and household ADLs    Status  Achieved        PT Long Term Goals - 07/14/18 1131      PT LONG TERM GOAL #1   Title  The patient will be independent with safe self progression of HEP needed for further improvements in pain and function    Time  6    Period  Weeks    Status  On-going    Target Date  08/25/18      PT LONG TERM GOAL #2   Title  The patient will have improved right shoulder strength to grossly 4/5 and left shoulder to 4+/5 needed for lifting household objects    Time  6    Status  Revised      PT LONG TERM GOAL #3   Title  The patient will report a 60% improvement in overall function including dressing, household ADLs and driving    Time  6    Period  Weeks    Status  Revised      PT LONG TERM GOAL #4   Title  FOTO functional  outcome score improved from 62% limitation to 38% limitation     Time  6    Period  Weeks    Status  On-going      PT LONG TERM GOAL #5   Title  The patient will be able to lift a 2# object into the cabinets    Time  6    Period  Weeks    Status  New            Plan - 07/25/18 1059    Clinical Impression Statement  Despite an incident 2 days ago and increase right shoulder pain, she is able to progress scapular strengthening ex's and increase resistance with many exercises.  Therapist closely monitoring response with all exs.  Fewer verbal cues needed to decrease compensatory shoulder hike.      Rehab Potential  Good    PT Frequency  1x / week    PT Duration  6 weeks    PT Treatment/Interventions  ADLs/Self Care Home Management;Cryotherapy;Electrical Stimulation;Ultrasound;Moist Heat;Iontophoresis 4mg /ml Dexamethasone;Neuromuscular re-education;Therapeutic activities;Therapeutic exercise;Manual techniques;Taping;Dry needling    PT Next Visit Plan  glenohumeral and scapular strengthening low level within pain limits and low resistance    PT Home Exercise Plan   Access Code: [email or web address]        Patient will benefit from skilled therapeutic intervention in order to improve the following deficits and impairments:  Pain, Decreased range of motion, Decreased strength, Decreased activity tolerance, Impaired UE functional use  Visit Diagnosis: Chronic right shoulder pain  Chronic left shoulder pain  Muscle weakness (generalized)  Stiffness of right shoulder, not elsewhere classified     Problem List Patient Active Problem List   Diagnosis Date Noted  . Abdominal pain 07/04/2018  . Urinary urgency 05/05/2018  . Chronic right shoulder pain 05/05/2018  . Vitamin D deficiency 05/05/2018  . Other fatigue 01/02/2018  . Allergic rhinitis 01/02/2018  . Weight gain 01/02/2018  . PVC (  premature ventricular contraction) 03/09/2017  . Routine general medical examination at a health care  facility 09/01/2015  . Hyperlipidemia 09/01/2015  . Varicose vein 08/27/2014  . Primary localized osteoarthrosis, lower leg 10/30/2013  . Anxiety state 03/23/2013  . 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 Depression 01/01/2013  . Abnormal stress echo 12/11/2012  . Palpitations 09/14/2010   Ruben Im, PT 07/25/18 7:24 PM Phone: [phone] Fax: [phone] Alvera Singh 07/25/2018, 7:24 PM  [facility] [address] [place], [place], [address] Phone: [phone]   Fax:  [phone]  Name: Jean Smith MRN: [id number] Date of Birth: Oct 13, 1950

## 2018-08-01 ENCOUNTER — Encounter: Payer: Self-pay | Admitting: Physical Therapy

## 2018-08-01 ENCOUNTER — Ambulatory Visit: Payer: BLUE CROSS/BLUE SHIELD | Admitting: Physical Therapy

## 2018-08-01 DIAGNOSIS — M25512 Pain in left shoulder: Secondary | ICD-10-CM | POA: Diagnosis not present

## 2018-08-01 DIAGNOSIS — M6281 Muscle weakness (generalized): Secondary | ICD-10-CM

## 2018-08-01 DIAGNOSIS — M25611 Stiffness of right shoulder, not elsewhere classified: Secondary | ICD-10-CM

## 2018-08-01 DIAGNOSIS — M25511 Pain in right shoulder: Secondary | ICD-10-CM | POA: Diagnosis not present

## 2018-08-01 DIAGNOSIS — G8929 Other chronic pain: Secondary | ICD-10-CM | POA: Diagnosis not present

## 2018-08-01 NOTE — Therapy (Signed)
[facility] [address] [place], [place], [address] Phone: [phone]   Fax:  [phone]  Physical Therapy Treatment  Patient Details  Name: Jean Smith MRN: [id number] Date of Birth: 12-Apr-1951 Referring Provider (PT): Dr. Pricilla Holm   Encounter Date: 08/01/2018  PT End of Session - 08/01/18 1124    Visit Number  14    Number of Visits  30    Date for PT Re-Evaluation  08/25/18    Authorization Type  [employer] 30 visit limit    PT Start Time  1115   pt late   PT Stop Time  1145    PT Time Calculation (min)  30 min    Activity Tolerance  Patient tolerated treatment well       Past Medical History:  Diagnosis Date  . Allergy   . Anxiety   . Cataract    bilateral - MD is just watching   . Diverticulosis   . Fuchs' endothelial dystrophy    follows with optho regularly   . Gestational diabetes   . History of depression   . HSV-2 infection   . Hyperlipidemia    no meds - diet controlled  . Hyperplastic colon polyp   . Mitral valve prolapse   . PVC's (premature ventricular contractions)    intol of BB, sxc palpitations r/t stress  . RVOT ventricular tachycardia/PVCs    EP eval 01/2013 for freq PVCs    Past Surgical History:  Procedure Laterality Date  . CESAREAN SECTION     x 3  . KNEE SURGERY Right   . MANDIBLE SURGERY     right side in front of ear  . WISDOM TOOTH EXTRACTION      There were no vitals filed for this visit.  Subjective Assessment - 08/01/18 1115    Subjective  Patient arrives late and reports she is frazzled.  My right shoulder hurts b/c  sleep on it.  It varies from day to day.  It's not the kind of pain that wakes me up though.      Pertinent History  "Jean Smith"    Currently in Pain?  Yes    Pain Score  2     Pain Location  Shoulder    Pain Orientation  Right    Aggravating Factors   not as bad with picking up the dog;  putting on a coat                        [facility] Adult PT Treatment/Exercise - 08/01/18 0001      Shoulder Exercises: Supine   Protraction  Strengthening;Right;Left;15 reps;Weights    Protraction Weight (lbs)  2    Other Supine Exercises  on wedge 2# circles 10x each way     Other Supine Exercises  on wedge 1# bent arm flexion elevation 15x right/left       Shoulder Exercises: Sidelying   Other Sidelying Exercises  1# horizontal abduction 15x right/left       Shoulder Exercises: Standing   Other Standing Exercises  red band around wrists low oscillate and shoulder level oscillate 5x 5 each     Other Standing Exercises  red band around wrists 3 ways "steps" on wall 10x right/left       Shoulder Exercises: ROM/Strengthening   UBE (Upper Arm Bike)  3 min forward/backward L3   PTA present for concurret review of staus   Wall Pushups  15 reps    Wall Pushups Limitations  angled nose to back of the hand     Other ROM/Strengthening Exercises  foam roll on wall flexion 15x     Other ROM/Strengthening Exercises  red ball on wall circles 10x each way right/left                PT Short Term Goals - 06/23/18 1126      PT SHORT TERM GOAL #1   Title  The patient will demonstrate compliance with initial HEP and self care strategies    Status  Achieved      PT SHORT TERM GOAL #2   Title  The patient will have improved shoulder ROM for flexion/elevation to 150 degrees on right and 160 degrees on left needed for putting on a pullover top    Status  Achieved      PT SHORT TERM GOAL #3   Title  The patient will report a 30% improvement in bilateral shoulder pain with dressing and household ADLs    Status  Achieved        PT Long Term Goals - 07/14/18 1131      PT LONG TERM GOAL #1   Title  The patient will be independent with safe self progression of HEP needed for further improvements in pain and function    Time  6    Period  Weeks    Status  On-going    Target Date  08/25/18       PT LONG TERM GOAL #2   Title  The patient will have improved right shoulder strength to grossly 4/5 and left shoulder to 4+/5 needed for lifting household objects    Time  6    Status  Revised      PT LONG TERM GOAL #3   Title  The patient will report a 60% improvement in overall function including dressing, household ADLs and driving    Time  6    Period  Weeks    Status  Revised      PT LONG TERM GOAL #4   Title  FOTO functional outcome score improved from 62% limitation to 38% limitation     Time  6    Period  Weeks    Status  On-going      PT LONG TERM GOAL #5   Title  The patient will be able to lift a 2# object into the cabinets    Time  6    Period  Weeks    Status  New            Plan - 08/01/18 1201    Clinical Impression Statement  The patient reports her pain is no longer severe and she is sleeping better overall.  She is able to participate in a progression of scapular strengthening and stabilization ex's and although she reports they are very fatiguing, she has less shoulder popping and none of the usual shoulder pain with them.  Therapist closely monitoring response with all treatment interventions with fewer cues needed for compensatory shoulder hike.  Overall she is less fearful of physical activity.      Rehab Potential  Good    PT Frequency  1x / week    PT Treatment/Interventions  ADLs/Self Care Home Management;Cryotherapy;Electrical Stimulation;Ultrasound;Moist Heat;Iontophoresis 4mg /ml Dexamethasone;Neuromuscular re-education;Therapeutic activities;Therapeutic exercise;Manual techniques;Taping;Dry needling    PT Next Visit Plan  glenohumeral and scapular strengthening low to moderate level within pain limits and low resistance  PT Home Exercise Plan   Access Code: [email or web address]        Patient will benefit from skilled therapeutic intervention in order to improve the following deficits and impairments:  Pain, Decreased range of motion, Decreased strength,  Decreased activity tolerance, Impaired UE functional use  Visit Diagnosis: Chronic right shoulder pain  Chronic left shoulder pain  Muscle weakness (generalized)  Stiffness of right shoulder, not elsewhere classified     Problem List Patient Active Problem List   Diagnosis Date Noted  . Abdominal pain 07/04/2018  . Urinary urgency 05/05/2018  . Chronic right shoulder pain 05/05/2018  . Vitamin D deficiency 05/05/2018  . Other fatigue 01/02/2018  . Allergic rhinitis 01/02/2018  . Weight gain 01/02/2018  . PVC (premature ventricular contraction) 03/09/2017  . Routine general medical examination at a health care facility 09/01/2015  . Hyperlipidemia 09/01/2015  . Varicose vein 08/27/2014  . Primary localized osteoarthrosis, lower leg 10/30/2013  . Anxiety state 03/23/2013  . 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 Depression 01/01/2013  . Abnormal stress echo 12/11/2012  . Palpitations 09/14/2010   Ruben Im, PT 08/01/18 12:05 PM Phone: [phone] Fax: [phone] Alvera Singh 08/01/2018, 12:05 PM  [facility] [address], [place], [place], [address] Phone: [phone]   Fax:  [phone]  Name: Jean Smith MRN: [id number] Date of Birth: 05/28/1951

## 2018-08-02 DIAGNOSIS — F4323 Adjustment disorder with mixed anxiety and depressed mood: Secondary | ICD-10-CM | POA: Diagnosis not present

## 2018-08-07 DIAGNOSIS — F4323 Adjustment disorder with mixed anxiety and depressed mood: Secondary | ICD-10-CM | POA: Diagnosis not present

## 2018-08-08 ENCOUNTER — Encounter: Payer: BLUE CROSS/BLUE SHIELD | Admitting: Physical Therapy

## 2018-08-11 ENCOUNTER — Ambulatory Visit: Payer: BLUE CROSS/BLUE SHIELD | Admitting: Physical Therapy

## 2018-08-11 ENCOUNTER — Encounter: Payer: Self-pay | Admitting: Physical Therapy

## 2018-08-11 DIAGNOSIS — G8929 Other chronic pain: Secondary | ICD-10-CM

## 2018-08-11 DIAGNOSIS — M25512 Pain in left shoulder: Secondary | ICD-10-CM | POA: Diagnosis not present

## 2018-08-11 DIAGNOSIS — M6281 Muscle weakness (generalized): Secondary | ICD-10-CM | POA: Diagnosis not present

## 2018-08-11 DIAGNOSIS — M25611 Stiffness of right shoulder, not elsewhere classified: Secondary | ICD-10-CM

## 2018-08-11 DIAGNOSIS — M25511 Pain in right shoulder: Secondary | ICD-10-CM | POA: Diagnosis not present

## 2018-08-11 NOTE — Therapy (Signed)
[facility] [address] [place], [place], [address] Phone: [phone]   Fax:  [phone]  Physical Therapy Treatment  Patient Details  Name: Jean Smith MRN: [id number] Date of Birth: 08/14/1950 Referring Provider (PT): Dr. Pricilla Holm   Encounter Date: 08/11/2018  PT End of Session - 08/11/18 1108    Visit Number  15    Number of Visits  30    Date for PT Re-Evaluation  08/25/18    Authorization Type  BCBS 30 visit limit    PT Start Time  1105    PT Stop Time  1145    PT Time Calculation (min)  40 min    Activity Tolerance  Patient tolerated treatment well       Past Medical History:  Diagnosis Date  . Allergy   . Anxiety   . Cataract    bilateral - MD is just watching   . Diverticulosis   . Fuchs' endothelial dystrophy    follows with optho regularly   . Gestational diabetes   . History of depression   . HSV-2 infection   . Hyperlipidemia    no meds - diet controlled  . Hyperplastic colon polyp   . Mitral valve prolapse   . PVC's (premature ventricular contractions)    intol of BB, sxc palpitations r/t stress  . RVOT ventricular tachycardia/PVCs    EP eval 01/2013 for freq PVCs    Past Surgical History:  Procedure Laterality Date  . CESAREAN SECTION     x 3  . KNEE SURGERY Right   . MANDIBLE SURGERY     right side in front of ear  . WISDOM TOOTH EXTRACTION      There were no vitals filed for this visit.  Subjective Assessment - 08/11/18 1109    Subjective  I had emergency dental stuff and I could only do my ex's a couple of times.  My ROM is better but I have no muscles.      Currently in Pain?  Yes    Pain Score  2     Pain Orientation  Right         [facility] PT Assessment - 08/11/18 0001      AROM   Right Shoulder Flexion  170 Degrees    Right Shoulder ABduction  160 Degrees    Right Shoulder External Rotation  85 Degrees    Left Shoulder Flexion  175 Degrees    Left Shoulder ABduction  172 Degrees    Left Shoulder External Rotation  85 Degrees      Strength   Right Shoulder Flexion  4+/5    Right Shoulder Extension  4+/5    Right Shoulder ABduction  4/5    Right Shoulder Internal Rotation  4/5    Right Shoulder External Rotation  4/5    Left Shoulder Flexion  4+/5    Left Shoulder Extension  4+/5    Left Shoulder ABduction  4/5    Left Shoulder Internal Rotation  4/5    Left Shoulder External Rotation  4+/5                   [facility] Adult PT Treatment/Exercise - 08/11/18 0001      Shoulder Exercises: Supine   Protraction  Strengthening;Right;Left;15 reps;Weights    Protraction Weight (lbs)  2    Other Supine Exercises  2# 4 ways 10x right/left     Other Supine Exercises  1# bent arm flexion elevation 15x right/left       Shoulder Exercises: Sidelying   Other Sidelying Exercises  1# horizontal abduction 15x right/left     Other Sidelying Exercises  2# 4 ways 10x right/let       Shoulder Exercises: Standing   Extension  Strengthening;Right;Left;15 reps;Theraband    Theraband Level (Shoulder Extension)  Level 2 (Red)    Row  Strengthening;Right;Left;10 reps;Theraband    Diagonals  Strengthening;Right;Left;15 reps;Theraband    Theraband Level (Shoulder Diagonals)  Level 2 (Red)    Diagonals Limitations  extensions    Other Standing Exercises  red band triceps 15x       Shoulder Exercises: ROM/Strengthening   UBE (Upper Arm Bike)  3 min forward/backward L3   While discussing status    Wall Pushups  15 reps    Wall Pushups Limitations  red ball on wall       UEs on countertop red band side step 10x right/left         PT Short Term Goals - 06/23/18 1126      PT SHORT TERM GOAL #1   Title  The patient will demonstrate compliance with initial HEP and self care strategies    Status  Achieved      PT SHORT TERM GOAL #2   Title  The patient will have improved shoulder ROM for flexion/elevation to 150 degrees on  right and 160 degrees on left needed for putting on a pullover top    Status  Achieved      PT SHORT TERM GOAL #3   Title  The patient will report a 30% improvement in bilateral shoulder pain with dressing and household ADLs    Status  Achieved        PT Long Term Goals - 07/14/18 1131      PT LONG TERM GOAL #1   Title  The patient will be independent with safe self progression of HEP needed for further improvements in pain and function    Time  6    Period  Weeks    Status  On-going    Target Date  08/25/18      PT LONG TERM GOAL #2   Title  The patient will have improved right shoulder strength to grossly 4/5 and left shoulder to 4+/5 needed for lifting household objects    Time  6    Status  Revised      PT LONG TERM GOAL #3   Title  The patient will report a 60% improvement in overall function including dressing, household ADLs and driving    Time  6    Period  Weeks    Status  Revised      PT LONG TERM GOAL #4   Title  FOTO functional outcome score improved from 62% limitation to 38% limitation     Time  6    Period  Weeks    Status  On-going      PT LONG TERM GOAL #5   Title  The patient will be able to lift a 2# object into the cabinets    Time  6    Period  Weeks    Status  New            Plan - 08/11/18 1135    Clinical Impression Statement  The patient continues to make improvements in pain reduction and strength.  Her shoulder ROM is WFLS in all planes but with pain with  internal rotation.  Therapist modifying positioning with some ex's for other issues (elbow pain and lateral hip pain).  She should meet remaining goals in next 2-3 visits.      Rehab Potential  Good    PT Frequency  1x / week    PT Duration  6 weeks    PT Treatment/Interventions  ADLs/Self Care Home Management;Cryotherapy;Electrical Stimulation;Ultrasound;Moist Heat;Iontophoresis 4mg /ml Dexamethasone;Neuromuscular re-education;Therapeutic activities;Therapeutic exercise;Manual  techniques;Taping;Dry needling    PT Next Visit Plan  glenohumeral and scapular strengthening low to moderate level within pain limits and low resistance    PT Home Exercise Plan   Access Code: [email or web address]        Patient will benefit from skilled therapeutic intervention in order to improve the following deficits and impairments:  Pain, Decreased range of motion, Decreased strength, Decreased activity tolerance, Impaired UE functional use  Visit Diagnosis: Chronic right shoulder pain  Chronic left shoulder pain  Muscle weakness (generalized)  Stiffness of right shoulder, not elsewhere classified     Problem List Patient Active Problem List   Diagnosis Date Noted  . Abdominal pain 07/04/2018  . Urinary urgency 05/05/2018  . Chronic right shoulder pain 05/05/2018  . Vitamin D deficiency 05/05/2018  . Other fatigue 01/02/2018  . Allergic rhinitis 01/02/2018  . Weight gain 01/02/2018  . PVC (premature ventricular contraction) 03/09/2017  . Routine general medical examination at a health care facility 09/01/2015  . Hyperlipidemia 09/01/2015  . Varicose vein 08/27/2014  . Primary localized osteoarthrosis, lower leg 10/30/2013  . Anxiety state 03/23/2013  . 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 Depression 01/01/2013  . Abnormal stress echo 12/11/2012  . Palpitations 09/14/2010   Ruben Im, PT 08/11/18 11:58 AM Phone: [phone] Fax: [phone] Alvera Singh 08/11/2018, 11:58 AM  [facility] [address], [place], [place], [address] Phone: [phone]   Fax:  [phone]  Name: Jean Smith MRN: [id number] Date of Birth: [birth year]

## 2018-08-15 ENCOUNTER — Ambulatory Visit: Payer: BLUE CROSS/BLUE SHIELD | Attending: Internal Medicine | Admitting: Physical Therapy

## 2018-08-15 ENCOUNTER — Encounter: Payer: Self-pay | Admitting: Physical Therapy

## 2018-08-15 DIAGNOSIS — M6281 Muscle weakness (generalized): Secondary | ICD-10-CM | POA: Diagnosis not present

## 2018-08-15 DIAGNOSIS — M25611 Stiffness of right shoulder, not elsewhere classified: Secondary | ICD-10-CM | POA: Insufficient documentation

## 2018-08-15 DIAGNOSIS — M25512 Pain in left shoulder: Secondary | ICD-10-CM | POA: Insufficient documentation

## 2018-08-15 DIAGNOSIS — M25511 Pain in right shoulder: Secondary | ICD-10-CM | POA: Insufficient documentation

## 2018-08-15 DIAGNOSIS — G8929 Other chronic pain: Secondary | ICD-10-CM | POA: Insufficient documentation

## 2018-08-15 NOTE — Therapy (Addendum)
[facility] [address], [place], [place], [address] Phone: [phone]   Fax:  [phone]  Physical Therapy Treatment/Discharge summary   Patient Details  Name: Jean Smith MRN: [id number] Date of Birth: [birth year] Referring Provider (PT): Dr. Pricilla Holm   Encounter Date: 08/15/2018  PT End of Session - 08/15/18 1109    Visit Number  16    Number of Visits  30    Date for PT Re-Evaluation  08/25/18    Authorization Type  [employer] 30 visit limit    PT Start Time  1109    PT Stop Time  1150    PT Time Calculation (min)  41 min    Activity Tolerance  Patient tolerated treatment well       Past Medical History:  Diagnosis Date  . Allergy   . Anxiety   . Cataract    bilateral - MD is just watching   . Diverticulosis   . Fuchs' endothelial dystrophy    follows with optho regularly   . Gestational diabetes   . History of depression   . HSV-2 infection   . Hyperlipidemia    no meds - diet controlled  . Hyperplastic colon polyp   . Mitral valve prolapse   . PVC's (premature ventricular contractions)    intol of BB, sxc palpitations r/t stress  . RVOT ventricular tachycardia/PVCs    EP eval 01/2013 for freq PVCs    Past Surgical History:  Procedure Laterality Date  . CESAREAN SECTION     x 3  . KNEE SURGERY Right   . MANDIBLE SURGERY     right side in front of ear  . WISDOM TOOTH EXTRACTION      There were no vitals filed for this visit.  Subjective Assessment - 08/15/18 1110    Subjective  Patient arrives late.  She is upset regarding issues with her son.      Pertinent History  "Melainie"    Currently in Pain? no   Pain Location  Shoulder    Pain Orientation  Right                       [facility] Adult PT Treatment/Exercise - 08/15/18 0001      Shoulder Exercises: Supine   Protraction  Right;Left;10 reps;Weights    Protraction Weight (lbs)  3    Other Supine Exercises  1#  triceps extensions 20x     Other Supine Exercises  2# V chops 20x       Shoulder Exercises: Standing   Other Standing Exercises  red band side stepping on wall 20x      Shoulder Exercises: ROM/Strengthening   UBE (Upper Arm Bike)  3 min forward/backward L3   PTA present for concurret review of staus   Other ROM/Strengthening Exercises  seated push ups 12x     Other ROM/Strengthening Exercises  3# box formation supine on wedge 10x right/left       Shoulder Exercises: Isometric Strengthening   ADduction Limitations  green band sidestep 10x isometric external rotation and internal rotation 10x each     Other Isometric Exercises  green band resisted walk backwards 10x             PT Education - 08/15/18 1153    Education Details  typed instructions 3# weight punches, squares, red band on wall lateral movements alternating    Person(s) Educated  Patient    Methods  Explanation;Handout    Comprehension  Returned demonstration;Verbalized understanding       PT Short Term Goals - 06/23/18 1126      PT SHORT TERM GOAL #1   Title  The patient will demonstrate compliance with initial HEP and self care strategies    Status  Achieved      PT SHORT TERM GOAL #2   Title  The patient will have improved shoulder ROM for flexion/elevation to 150 degrees on right and 160 degrees on left needed for putting on a pullover top    Status  Achieved      PT SHORT TERM GOAL #3   Title  The patient will report a 30% improvement in bilateral shoulder pain with dressing and household ADLs    Status  Achieved        PT Long Term Goals - 07/14/18 1131      PT LONG TERM GOAL #1   Title  The patient will be independent with safe self progression of HEP needed for further improvements in pain and function    Time  6    Period  Weeks    Status  On-going    Target Date  08/25/18      PT LONG TERM GOAL #2   Title  The patient will have improved right shoulder strength to grossly 4/5 and left  shoulder to 4+/5 needed for lifting household objects    Time  6    Status  Revised      PT LONG TERM GOAL #3   Title  The patient will report a 60% improvement in overall function including dressing, household ADLs and driving    Time  6    Period  Weeks    Status  Revised      PT LONG TERM GOAL #4   Title  FOTO functional outcome score improved from 62% limitation to 38% limitation     Time  6    Period  Weeks    Status  On-going      PT LONG TERM GOAL #5   Title  The patient will be able to lift a 2# object into the cabinets    Time  6    Period  Weeks    Status  New            Plan - 08/15/18 1149    Clinical Impression Statement  The patient is able to progress resistance and intensity of ex's.  Some modifications needed with some ex's secondary to wrist pain and elbow pain.  Patient states, "I really like coming.  I was able to forget about my son for a little while."  She should meet remaining visits in 1-2 visits.   Discussed coming up with a plan for continued ex following completion of PT.     Rehab Potential  Good    PT Frequency  1x / week    PT Duration  6 weeks    PT Treatment/Interventions  ADLs/Self Care Home Management;Cryotherapy;Electrical Stimulation;Ultrasound;Moist Heat;Iontophoresis 73m/ml Dexamethasone;Neuromuscular re-education;Therapeutic activities;Therapeutic exercise;Manual techniques;Taping;Dry needling    PT Next Visit Plan  glenohumeral and scapular strengthening low to moderate level within pain limits    PT Home Exercise Plan   Access Code: [email or web address]        Patient will benefit from skilled therapeutic intervention in order to improve the following deficits and impairments:  Pain, Decreased range of motion, Decreased strength, Decreased activity tolerance, Impaired UE functional use  Visit Diagnosis:  Chronic right shoulder pain  Chronic left shoulder pain  Muscle weakness (generalized)  Stiffness of right shoulder, not elsewhere  classified    PHYSICAL THERAPY DISCHARGE SUMMARY  Visits from Start of Care: 16  Current functional level related to goals / functional outcomes: The patient cancelled her last 2 appts for anticipated formal discharge from PT secondary to illness prior to the [place] pandemic.  Since the pandemic she has not returned.  Will discharge her from PT at this time.  If further needs arise will be happy to resume with a new order.     Remaining deficits: As above   Education / Equipment: comprehensive HEP  Plan:                                                    Patient goals were partially met. Patient is being discharged due to not returning since the last visit.  ?????      Problem List Patient Active Problem List   Diagnosis Date Noted  . Abdominal pain 07/04/2018  . Urinary urgency 05/05/2018  . Chronic right shoulder pain 05/05/2018  . Vitamin D deficiency 05/05/2018  . Other fatigue 01/02/2018  . Allergic rhinitis 01/02/2018  . Weight gain 01/02/2018  . PVC (premature ventricular contraction) 03/09/2017  . Routine general medical examination at a health care facility 09/01/2015  . Hyperlipidemia 09/01/2015  . Varicose vein 08/27/2014  . Primary localized osteoarthrosis, lower leg 10/30/2013  . Anxiety state 03/23/2013  . 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 Depression 01/01/2013  . Abnormal stress echo 12/11/2012  . Palpitations 09/14/2010   Ruben Im, PT 08/15/18 11:59 AM Phone: [phone] Fax: [phone]  Alvera Singh 08/15/2018, 11:58 AM  [facility] [address], [place], [place], [address] Phone: [phone]   Fax:  [phone]  Name: Jean Smith MRN: [id number] Date of Birth: 03/08/1951

## 2018-08-15 NOTE — Patient Instructions (Signed)
Hold band in place by your sides and walk backwards, trying NOT to move your arms.    15x   Also, turn to the side and side step.  Try not to let arm move.  10x   Red band hold on wall and move it to the side, alternating sides 20x     Use 3# punches while lying down and small squares in the air 10x   [facility] [address] [place], [place] [address] Phone # [phone] Fax [phone]

## 2018-08-22 ENCOUNTER — Telehealth: Payer: Self-pay | Admitting: Physical Therapy

## 2018-08-22 ENCOUNTER — Ambulatory Visit: Payer: BLUE CROSS/BLUE SHIELD | Admitting: Physical Therapy

## 2018-08-22 NOTE — Telephone Encounter (Signed)
-----   Message from Wendall Papa, [place] sent at 08/22/2018 11:32 AM EDT ----- Regarding: Patient Info Patient got the stomach/nora virus and overslept this morning. She hopes to be feeling better for her next visit on Tuesday.  Regards, Caryl Pina

## 2018-08-29 ENCOUNTER — Ambulatory Visit: Payer: BLUE CROSS/BLUE SHIELD | Admitting: Physical Therapy

## 2018-09-22 ENCOUNTER — Telehealth: Payer: Self-pay | Admitting: *Deleted

## 2018-09-22 NOTE — Telephone Encounter (Signed)
..       Confirm consent - "In the setting of the current Covid19 crisis, you are scheduled for a video visit with your provider on 10/09/2018 at 10:00 AM.  Just as we do with many in-office visits, in order for you to participate in this visit, we must obtain consent.  If you'd like, I can send this to your mychart (if signed up) for you to review.  Otherwise, I can obtain your verbal consent now.  All virtual visits are billed to your insurance company just like a normal visit would be.  By agreeing to a virtual visit, we'd like you to understand that the technology does not allow for your provider to perform an examination, and thus may limit your provider's ability to fully assess your condition.  Finally, though the technology is pretty good, we cannot assure that it will always work on either your or our end, and in the setting of a video visit, we may have to convert it to a phone-only visit.  In either situation, we cannot ensure that we have a secure connection.    TELEPHONE CALL NOTE  Jean Smith has been deemed a candidate for a follow-up tele-health visit to limit community exposure during the Covid-19 pandemic. I spoke with the patient via phone to ensure availability of phone/video source, confirm preferred phone number, and discuss instructions and expectations. Explained and sent consent along with simple instructions on how to use Doximity to patients active Mychart account. The patient was advised to review the section on consent for treatment as well. She verbalized that she will read this prior to [place] calling her several days before her appointment.  I reminded Jean Smith to be prepared with any vital sign and/or heart rhythm information that could potentially be obtained via home monitoring, at the time of her visit.   Did the patient verbally acknowledge consent to treatment? Yes  Juventino Slovak, CMA 09/22/2018 11:21 AM

## 2018-10-08 NOTE — Progress Notes (Signed)
Virtual Visit via Video Note   This visit type was conducted due to national recommendations for restrictions regarding the COVID-19 Pandemic (e.g. social distancing) in an effort to limit this patient's exposure and mitigate transmission in our community.  Due to her co-morbid illnesses, this patient is at least at moderate risk for complications without adequate follow up.  This format is felt to be most appropriate for this patient at this time.  All issues noted in this document were discussed and addressed.  A limited physical exam was performed with this format.  Please refer to the patient's chart for her consent to telehealth for [facility].   Evaluation Performed:  Follow-up visit  Date:  10/08/2018   ID:  Jean Smith, DOB 1951/04/27, MRN [id number]  Patient Location: Home Provider Location: Home  PCP:  Hoyt Koch, MD  Cardiologist: Dr Acie Fredrickson  Chief Complaint:  palpitations  History of Present Illness:    Jean Smith is a 68 y.o. female with h/o MVP and palpitations. Lots of reported stress from a son who is autistic. Normal stress echo from 2009 with normal LV function and MVP noted. Last seen by Dr Acie Fredrickson in 2016 for palpitations and dizziness related to PVCs.  Believed to be related to high caffeine intake and she was instructed to use propranolol as needed.  10/08/2018 - 4 years follow up, the patient has been under a lot of stress - pandemia and having a son on a spectrum with substance abuse. Also primary care giver of an elderly mother who she is not able to see. She has been experiencing more palpitations - there is no associated CP or SOB, but anxiety especially when they wake her up at night. She has been taking PRN xanax and propranolol.    The patient does not have symptoms concerning for COVID-19 infection (fever, chills, cough, or new shortness of breath).    Past Medical History:  Diagnosis Date  . Allergy   . Anxiety   . Cataract    bilateral - MD is just watching   . Diverticulosis   . Fuchs' endothelial dystrophy    follows with optho regularly   . Gestational diabetes   . History of depression   . HSV-2 infection   . Hyperlipidemia    no meds - diet controlled  . Hyperplastic colon polyp   . Mitral valve prolapse   . PVC's (premature ventricular contractions)    intol of BB, sxc palpitations r/t stress  . RVOT ventricular tachycardia/PVCs    EP eval 01/2013 for freq PVCs   Past Surgical History:  Procedure Laterality Date  . CESAREAN SECTION     x 3  . KNEE SURGERY Right   . MANDIBLE SURGERY     right side in front of ear  . WISDOM TOOTH EXTRACTION       No outpatient medications have been marked as taking for the 10/09/18 encounter (Appointment) with Dorothy Spark, MD.     Allergies:   Atorvastatin; Crestor [rosuvastatin calcium]; Latex; Lipitor [atorvastatin calcium]; Other; Pollen extract; and Vitamin d analogs   Social History   Tobacco Use  . Smoking status: Former Smoker    Types: Cigarettes    Last attempt to quit: 06/15/1983    Years since quitting: 35.3  . Smokeless tobacco: Never Used  Substance Use Topics  . Alcohol use: Yes    Comment: occasional  . Drug use: No     Family Hx: The patient's  family history includes Alcohol abuse in an other family member; Colon cancer in her maternal grandmother and mother; Colon polyps in her sister; Heart disease in her father. There is no history of Rectal cancer or Stomach cancer.  ROS:   Please see the history of present illness.    All other systems reviewed and are negative.   Prior CV studies:   The following studies were reviewed today:   Labs/Other Tests and Data Reviewed:    EKG:  No ECG reviewed.  Recent Labs: 01/02/2018: Hemoglobin 13.6; Platelets 250.0; Pro B Natriuretic peptide (BNP) 16.0; TSH 1.19 05/04/2018: ALT 14; BUN 17; Creatinine, Ser 0.95; Potassium 4.3; Sodium 140   Recent Lipid Panel Lab Results  Component  Value Date/Time   CHOL 256 (H) 01/02/2018 02:59 PM   TRIG 246.0 (H) 01/02/2018 02:59 PM   HDL 60.40 01/02/2018 02:59 PM   CHOLHDL 4 01/02/2018 02:59 PM   LDLCALC 188 (H) 09/01/2015 11:03 AM   LDLDIRECT 168.0 01/02/2018 02:59 PM    Wt Readings from Last 3 Encounters:  07/04/18 165 lb (74.8 kg)  05/04/18 167 lb (75.8 kg)  03/09/18 164 lb 3.2 oz (74.5 kg)     Objective:    Vital Signs:  There were no vitals taken for this visit.   VITAL SIGNS:  reviewed  ASSESSMENT & PLAN:    1.  Palpitations - h/o Premature ventricular contractions:  She's drinking caffeinated tea all day long. She is advised to cut down, increase fluid intake, start daily exercises, we will arrange fo a 14 day event monitor.  2. Hyperlipidemia - severely elevated LDL 188 and TG 240 with FH of early CAD in her father (MI at age 43), she had prior side effects from atorvastatin and rosuvastatin, and is very reluctant to take any statins, we will arrange for a calcium scoring once pandemia is over.  3. H/o MVP - we will evaluate once able to examine - at the next visit  COVID-19 Education: The signs and symptoms of COVID-19 were discussed with the patient and how to seek care for testing (follow up with PCP or arrange E-visit).  The importance of social distancing was discussed today.  Time:   Today, I have spent 30 minutes with the patient with telehealth technology discussing the above problems.     Medication Adjustments/Labs and Tests Ordered: Current medicines are reviewed at length with the patient today.  Concerns regarding medicines are outlined above.   Tests Ordered: No orders of the defined types were placed in this encounter.   Medication Changes: No orders of the defined types were placed in this encounter.   Disposition:  Follow up in 2 month(s)  Signed, Ena Dawley, MD  10/08/2018 12:27 PM    [place]

## 2018-10-09 ENCOUNTER — Encounter: Payer: Self-pay | Admitting: *Deleted

## 2018-10-09 ENCOUNTER — Other Ambulatory Visit: Payer: Self-pay

## 2018-10-09 ENCOUNTER — Telehealth (INDEPENDENT_AMBULATORY_CARE_PROVIDER_SITE_OTHER): Payer: BLUE CROSS/BLUE SHIELD | Admitting: Cardiology

## 2018-10-09 ENCOUNTER — Encounter: Payer: Self-pay | Admitting: Cardiology

## 2018-10-09 ENCOUNTER — Encounter

## 2018-10-09 ENCOUNTER — Telehealth: Payer: Self-pay | Admitting: *Deleted

## 2018-10-09 VITALS — BP 125/74 | HR 94 | Ht 64.0 in | Wt 157.0 lb

## 2018-10-09 DIAGNOSIS — R002 Palpitations: Secondary | ICD-10-CM | POA: Diagnosis not present

## 2018-10-09 DIAGNOSIS — I493 Ventricular premature depolarization: Secondary | ICD-10-CM

## 2018-10-09 DIAGNOSIS — Z8249 Family history of ischemic heart disease and other diseases of the circulatory system: Secondary | ICD-10-CM | POA: Diagnosis not present

## 2018-10-09 DIAGNOSIS — E782 Mixed hyperlipidemia: Secondary | ICD-10-CM | POA: Diagnosis not present

## 2018-10-09 NOTE — Telephone Encounter (Signed)
RE: NEEDS APPT FOR LONG TERM ZIO MONITOR  Received: Today  Message Contents  Seleta Rhymes, LPN        Monitor mailed, estimated end of service report 5/17 to 5/23. Thanks, Darrick Penna   Previous Messages

## 2018-10-09 NOTE — Patient Instructions (Signed)
Medication Instructions:   Your physician recommends that you continue on your current medications as directed. Please refer to the Current Medication list given to you today.  If you need a refill on your cardiac medications before your next appointment, please call your pharmacy.     Testing/Procedures:  3-14 DAY LONG-TERM CONTINUOUS PATCH (ZIO PATCH) FOR PALPITATIONS-OUR MONITOR TECHS AND SCHEDULING WILL BE IN TOUCH WITH YOU SOON ABOUT HOW THIS APPOINTMENT IS ARRANGED, AND HOW THEY WILL MAIL YOU THE MONITOR   Follow-Up:  IN 2 MONTHS AS ANOTHER VIRTUAL VIDEO VISIT WITH DR Meda Coffee ON Monday 12/25/18 AT 11 AM.

## 2018-10-20 ENCOUNTER — Encounter: Payer: Self-pay | Admitting: Gastroenterology

## 2018-10-30 ENCOUNTER — Ambulatory Visit (INDEPENDENT_AMBULATORY_CARE_PROVIDER_SITE_OTHER): Payer: BLUE CROSS/BLUE SHIELD

## 2018-10-30 DIAGNOSIS — R002 Palpitations: Secondary | ICD-10-CM | POA: Diagnosis not present

## 2018-11-17 DIAGNOSIS — R002 Palpitations: Secondary | ICD-10-CM | POA: Diagnosis not present

## 2018-11-20 ENCOUNTER — Other Ambulatory Visit: Payer: Self-pay

## 2018-11-22 ENCOUNTER — Telehealth: Payer: Self-pay

## 2018-11-22 DIAGNOSIS — I493 Ventricular premature depolarization: Secondary | ICD-10-CM

## 2018-11-22 DIAGNOSIS — R002 Palpitations: Secondary | ICD-10-CM

## 2018-11-22 DIAGNOSIS — Z8249 Family history of ischemic heart disease and other diseases of the circulatory system: Secondary | ICD-10-CM

## 2018-11-22 NOTE — Telephone Encounter (Signed)
Pt advised her monitor results and agrees to an Echo... next OV 12/2018.

## 2018-11-22 NOTE — Telephone Encounter (Signed)
-----   Message from Dorothy Spark, MD sent at 11/21/2018  7:58 PM EDT ----- Please repeat an echocardiogram to evaluate LVEF.

## 2018-11-28 ENCOUNTER — Telehealth (HOSPITAL_COMMUNITY): Payer: Self-pay | Admitting: Cardiology

## 2018-11-28 NOTE — Telephone Encounter (Signed)

## 2018-11-29 ENCOUNTER — Ambulatory Visit (HOSPITAL_COMMUNITY): Payer: BC Managed Care – PPO | Attending: Cardiology

## 2018-11-29 ENCOUNTER — Other Ambulatory Visit: Payer: Self-pay

## 2018-11-29 DIAGNOSIS — Z8249 Family history of ischemic heart disease and other diseases of the circulatory system: Secondary | ICD-10-CM | POA: Insufficient documentation

## 2018-11-29 DIAGNOSIS — I493 Ventricular premature depolarization: Secondary | ICD-10-CM | POA: Insufficient documentation

## 2018-11-29 DIAGNOSIS — R002 Palpitations: Secondary | ICD-10-CM | POA: Diagnosis not present

## 2018-12-05 ENCOUNTER — Encounter: Payer: BLUE CROSS/BLUE SHIELD | Admitting: Gastroenterology

## 2018-12-20 ENCOUNTER — Telehealth: Payer: Self-pay | Admitting: *Deleted

## 2018-12-20 NOTE — Telephone Encounter (Signed)
.      COVID-19 Pre-Screening Questions:  . In the past 7 to 10 days have you had a cough,  shortness of breath, headache, congestion, fever (100 or greater) body aches, chills, sore throat, or sudden loss of taste or sense of smell?NO . Have you been around anyone with known Covid 19.NO . Have you been around anyone who is awaiting Covid 19 test results in the past 7 to 10 days?NO . Have you been around anyone who has been exposed to Covid 19, or has mentioned symptoms of Covid 19 within the past 7 to 10 days?NO   Pt covid pre-screen questions were done and answered "no" to all.  Pt is aware of no visitor policy and to wear her face mask at all times to this appt.  Pt aware not to arrive more that 15 minutes prior to appointment time Pt verbalized understanding and agrees with this plan   If you have any concerns/questions about symptoms patients report during screening (either on the phone or at threshold). Contact the provider seeing the patient or DOD for further guidance.  If neither are available contact a member of the leadership team.            

## 2018-12-25 ENCOUNTER — Encounter: Payer: Self-pay | Admitting: Cardiology

## 2018-12-25 ENCOUNTER — Ambulatory Visit (INDEPENDENT_AMBULATORY_CARE_PROVIDER_SITE_OTHER): Payer: BC Managed Care – PPO | Admitting: Cardiology

## 2018-12-25 ENCOUNTER — Other Ambulatory Visit: Payer: Self-pay

## 2018-12-25 VITALS — BP 116/68 | HR 88 | Ht 64.0 in | Wt 157.6 lb

## 2018-12-25 DIAGNOSIS — R002 Palpitations: Secondary | ICD-10-CM

## 2018-12-25 DIAGNOSIS — I341 Nonrheumatic mitral (valve) prolapse: Secondary | ICD-10-CM

## 2018-12-25 DIAGNOSIS — E785 Hyperlipidemia, unspecified: Secondary | ICD-10-CM | POA: Diagnosis not present

## 2018-12-25 DIAGNOSIS — I472 Ventricular tachycardia: Secondary | ICD-10-CM

## 2018-12-25 DIAGNOSIS — E782 Mixed hyperlipidemia: Secondary | ICD-10-CM | POA: Diagnosis not present

## 2018-12-25 DIAGNOSIS — Z8249 Family history of ischemic heart disease and other diseases of the circulatory system: Secondary | ICD-10-CM

## 2018-12-25 DIAGNOSIS — I4729 Other ventricular tachycardia: Secondary | ICD-10-CM

## 2018-12-25 MED ORDER — METOPROLOL TARTRATE 50 MG PO TABS: 50.0000 mg | ORAL_TABLET | Freq: Once | ORAL | 0 refills | Status: DC

## 2018-12-25 NOTE — Progress Notes (Addendum)
Cardiology Office Note:    Date:  12/25/2018   ID:  Jean Smith, DOB 1951-03-29, MRN [id number]  PCP:  Hoyt Koch, MD  Cardiologist:  No primary care provider on file.  Electrophysiologist:  None   Referring MD: Hoyt Koch, *   Chief complain: Palpitations  History of Present Illness:    Jean Smith is a 68 y.o. female with a hx of MVP and palpitations. Lots of reported stress from a son who is autistic. Normal stress echo from 2009 with normal LV function and MVP noted. Last seen by Dr Acie Fredrickson in 2016 for palpitations and dizziness related to PVCs.  Believed to be related to high caffeine intake and she was instructed to use propranolol as needed.  10/08/2018 - 4 years follow up, the patient has been under a lot of stress - pandemia and having a son on a spectrum with substance abuse. Also primary care giver of an elderly mother who she is not able to see. She has been experiencing more palpitations - there is no associated CP or SOB, but anxiety especially when they wake her up at night. She has been taking PRN xanax and propranolol.   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  She continues to have palpitations however they have improved in the last 2 months.  She feels overall out of shape and fatigue chest pain.  No syncope.  She does not have time to exercise.  Past Medical History:  Diagnosis Date  . Allergy   . Anxiety   . Cataract    bilateral - MD is just watching   . Diverticulosis   . Fuchs' endothelial dystrophy    follows with optho regularly   . Gestational diabetes   . History of depression   . HSV-2 infection   . Hyperlipidemia    no meds - diet controlled  . Hyperplastic colon polyp   . Mitral valve prolapse   . PVC's  (premature ventricular contractions)    intol of BB, sxc palpitations r/t stress  . RVOT ventricular tachycardia/PVCs    EP eval 01/2013 for freq PVCs    Past Surgical History:  Procedure Laterality Date  . CESAREAN SECTION     x 3  . KNEE SURGERY Right   . MANDIBLE SURGERY     right side in front of ear  . WISDOM TOOTH EXTRACTION      Current Medications: Current Meds  Medication Sig  . ALPRAZolam (XANAX) 0.25 MG tablet Take 0.25 mg by mouth 3 (three) times daily as needed for anxiety.  Marland Kitchen b complex vitamins tablet Take 1 tablet by mouth daily.  . cholecalciferol (VITAMIN D3) 25 MCG (1000 UT) tablet Take 1,000 Units by mouth daily.  Marland Kitchen ibuprofen (ADVIL,MOTRIN) 200 MG tablet Take 200 mg by mouth 2 (two) times daily as needed.  . loratadine (CLARITIN) 10 MG tablet Take 10 mg by mouth daily as needed for allergies.   . Magnesium Bisglycinate Dihyd POWD Take 1 tablet by mouth 2 (two) times daily.   . propranolol (INDERAL) 10 MG tablet Take 1 tablet (10 mg total) by mouth daily. As needed for palpitations. Please make overdue appt with Dr. Acie Fredrickson. 3rd and final attempt     Allergies:   Atorvastatin, Crestor [employer]  calcium], Latex, Lipitor [atorvastatin calcium], Other, Pollen extract, and Vitamin d analogs   Social History   Socioeconomic History  . Marital status: Married    Spouse name: Not on file  . Number of children: 3  . Years of education: Not on file  . Highest education level: Not on file  Occupational History  . Not on file  Social Needs  . Financial resource strain: Not on file  . Food insecurity    Worry: Not on file    Inability: Not on file  . Transportation needs    Medical: Not on file    Non-medical: Not on file  Tobacco Use  . Smoking status: Former Smoker    Types: Cigarettes    Quit date: 06/15/1983    Years since quitting: 35.5  . Smokeless tobacco: Never Used  Substance and Sexual Activity  . Alcohol use: Yes    Comment: occasional  . Drug  use: No  . Sexual activity: Not Currently    Birth control/protection: Post-menopausal  Lifestyle  . Physical activity    Days per week: Not on file    Minutes per session: Not on file  . Stress: Not on file  Relationships  . Social Herbalist on phone: Not on file    Gets together: Not on file    Attends religious service: Not on file    Active member of club or organization: Not on file    Attends meetings of clubs or organizations: Not on file    Relationship status: Not on file  Other Topics Concern  . Not on file  Social History Narrative   Regular exercise: yes   Caffeine use: none     Family History: The patient's family history includes Alcohol abuse in an other family member; Colon cancer in her maternal grandmother and mother; Colon polyps in her sister; Heart disease in her father. There is no history of Rectal cancer or Stomach cancer.  ROS:   Please see the history of present illness.    All other systems reviewed and are negative.  EKGs/Labs/Other Studies Reviewed:    The following studies were reviewed today:  EKG:  EKG is ordered today.  The ekg ordered today demonstrates sinus rhythm with frequent PVCs, LVH.  This was personally reviewed.  Recent Labs: 01/02/2018: Hemoglobin 13.6; Platelets 250.0; Pro B Natriuretic peptide (BNP) 16.0; TSH 1.19 05/04/2018: ALT 14; BUN 17; Creatinine, Ser 0.95; Potassium 4.3; Sodium 140  Recent Lipid Panel    Component Value Date/Time   CHOL 256 (H) 01/02/2018 1459   TRIG 246.0 (H) 01/02/2018 1459   HDL 60.40 01/02/2018 1459   CHOLHDL 4 01/02/2018 1459   VLDL 49.2 (H) 01/02/2018 1459   LDLCALC 188 (H) 09/01/2015 1103   LDLDIRECT 168.0 01/02/2018 1459    Physical Exam:    VS:  BP 116/68   Pulse 88   Ht _0  (1.626 m)   Wt 157 lb 9.6 oz (71.5 kg)   SpO2 98%   BMI 27.05 kg/m     Wt Readings from Last 3 Encounters:  12/25/18 157 lb 9.6 oz (71.5 kg)  10/09/18 157 lb (71.2 kg)  07/04/18 165 lb (74.8  kg)    GEN:  Well nourished, well developed in no acute distress HEENT: Normal NECK: No JVD; No carotid bruits LYMPHATICS: No lymphadenopathy CARDIAC: RRR, no murmurs, rubs, gallops RESPIRATORY:  Clear to auscultation without rales, wheezing or rhonchi  ABDOMEN: Soft, non-tender, non-distended MUSCULOSKELETAL:  No edema; No deformity  SKIN: Warm and dry NEUROLOGIC:  Alert and oriented x 3 PSYCHIATRIC:  Normal affect   TTE: 11/20/2018   ASSESSMENT:    1. Family history of early CAD   2. Mixed hyperlipidemia   3. Hyperlipidemia, unspecified hyperlipidemia type   4. Palpitations   5. RVOT ventricular tachycardia/PVCs   6. NSVT (nonsustained ventricular tachycardia) (HCC)   7. MVP (mitral valve prolapse)    PLAN:    In order of problems listed above:  1.    Frequent PVCs in a pattern of bigeminy and trigeminy and 25 episodes of nonsustained VT's on 49 mm monitor with the longest lasting 5 beats.  Her LVEF is normal. EKG today shows normal sinus rhythm with PVCs after every 3 beats, PVC pattern suggestive of RVOT origin. She does not have a recent ischemia evaluation, she has minimal symptoms but she is minimally active, she also has untreated hyperlipidemia and family history of early CAD with MI in her father at age of 29.  We will arrange for coronary CTA to evaluate for ischemia and help [place] with future lipid management.  2. Hyperlipidemia - severely elevated LDL 188 and TG 240 with FH of early CAD in her father (MI at age 32), management as above.  3. H/o MVP -recent echo shows stable mild to moderate mitral regurgitation with normal left atrial size left ventricular function.  Medication Adjustments/Labs and Tests Ordered: Current medicines are reviewed at length with the patient today.  Concerns regarding medicines are outlined above.  Orders Placed This Encounter  Procedures  . CT CORONARY MORPH W/CTA COR W/SCORE W/CA W/CM &/OR WO/CM  . CT CORONARY FRACTIONAL FLOW RESERVE  DATA PREP  . CT CORONARY FRACTIONAL FLOW RESERVE FLUID ANALYSIS  . Comp Met (CMET)  . CBC w/Diff  . TSH  . Lipid Profile  . EKG 12-Lead   Meds ordered this encounter  Medications  . metoprolol tartrate (LOPRESSOR) 50 MG tablet    Sig: Take 1 tablet (50 mg total) by mouth once for 1 dose. Take 2 hours prior to your Coronary CT.    Dispense:  1 tablet    Refill:  0    Patient Instructions  Medication Instructions:   Your physician recommends that you continue on your current medications as directed. Please refer to the Current Medication list given to you today.  If you need a refill on your cardiac medications before your next appointment, please call your pharmacy.    Lab work:  TODAY--CMET, CBC W DIFF, TSH, CMET, LIPIDS  If you have labs (blood work) drawn today and your tests are completely normal, you will receive your results only by: Marland Kitchen MyChart Message (if you have MyChart) OR . A paper copy in the mail If you have any lab test that is abnormal or we need to change your treatment, we will call you to review the results.   Testing/Procedures:  CORONARY CT Please arrive at the [facility] main entrance of [facility] at xx:xx AM (30-45 minutes prior to test start time)  [facility] [place], [place] [address] [phone]  Proceed to the [facility] Radiology Department (First Floor).  Please follow these instructions carefully (unless otherwise directed):   On the Night Before the Test: . Be sure to Drink plenty of water. . Do not consume any caffeinated/decaffeinated beverages or chocolate 12 hours prior to your test. . Do not take any antihistamines 12 hours prior  to your test.   On the Day of the Test: . Drink plenty of water. Do not drink any water within one hour of the test. . Do not eat any food 4 hours prior to the test. . You may take your regular medications prior to the test.  . Take metoprolol 50 MG   (Lopressor) two hours prior to test.        After the Test: . Drink plenty of water. . After receiving IV contrast, you may experience a mild flushed feeling. This is normal. . On occasion, you may experience a mild rash up to 24 hours after the test. This is not dangerous. If this occurs, you can take Benadryl 25 mg and increase your fluid intake. . If you experience trouble breathing, this can be serious. If it is severe call 911 IMMEDIATELY. If it is mild, please call our office.     Follow-Up:  4 MONTHS WITH DR Meda Coffee AS A REGULAR OFFICE VISIT      Signed, Ena Dawley, MD  12/25/2018 11:54 AM    [place]

## 2018-12-25 NOTE — Patient Instructions (Signed)
Medication Instructions:   Your physician recommends that you continue on your current medications as directed. Please refer to the Current Medication list given to you today.  If you need a refill on your cardiac medications before your next appointment, please call your pharmacy.    Lab work:  TODAY--CMET, CBC W DIFF, TSH, CMET, LIPIDS  If you have labs (blood work) drawn today and your tests are completely normal, you will receive your results only by: Marland Kitchen MyChart Message (if you have MyChart) OR . A paper copy in the mail If you have any lab test that is abnormal or we need to change your treatment, we will call you to review the results.   Testing/Procedures:  CORONARY CT Please arrive at the [facility] main entrance of [facility] at xx:xx AM (30-45 minutes prior to test start time)  [facility] [place],  [address] [phone]  Proceed to the [facility] Radiology Department (First Floor).  Please follow these instructions carefully (unless otherwise directed):   On the Night Before the Test: . Be sure to Drink plenty of water. . Do not consume any caffeinated/decaffeinated beverages or chocolate 12 hours prior to your test. . Do not take any antihistamines 12 hours prior to your test.   On the Day of the Test: . Drink plenty of water. Do not drink any water within one hour of the test. . Do not eat any food 4 hours prior to the test. . You may take your regular medications prior to the test.  . Take metoprolol 50 MG  (Lopressor) two hours prior to test.        After the Test: . Drink plenty of water. . After receiving IV contrast, you may experience a mild flushed feeling. This is normal. . On occasion, you may experience a mild rash up to 24 hours after the test. This is not dangerous. If this occurs, you can take Benadryl 25 mg and increase your fluid intake. . If you experience trouble breathing, this can be  serious. If it is severe call 911 IMMEDIATELY. If it is mild, please call our office.     Follow-Up:  4 MONTHS WITH DR Meda Coffee AS A REGULAR OFFICE VISIT

## 2018-12-26 LAB — TSH: TSH: 1.77 u[IU]/mL (ref 0.450–4.500)

## 2018-12-26 LAB — COMPREHENSIVE METABOLIC PANEL
ALT: 14 IU/L (ref 0–32)
AST: 14 IU/L (ref 0–40)
Albumin/Globulin Ratio: 1.7 (ref 1.2–2.2)
Albumin: 4.5 g/dL (ref 3.8–4.8)
Alkaline Phosphatase: 52 IU/L (ref 39–117)
BUN/Creatinine Ratio: 22 (ref 12–28)
BUN: 16 mg/dL (ref 8–27)
Bilirubin Total: 0.3 mg/dL (ref 0.0–1.2)
CO2: 26 mmol/L (ref 20–29)
Calcium: 9.8 mg/dL (ref 8.7–10.3)
Chloride: 100 mmol/L (ref 96–106)
Creatinine, Ser: 0.73 mg/dL (ref 0.57–1.00)
GFR calc Af Amer: 99 mL/min/{1.73_m2} (ref >59)
GFR calc non Af Amer: 86 mL/min/{1.73_m2} (ref >59)
Globulin, Total: 2.6 g/dL (ref 1.5–4.5)
Glucose: 103 mg/dL — ABNORMAL HIGH (ref 65–99)
Potassium: 4.5 mmol/L (ref 3.5–5.2)
Sodium: 140 mmol/L (ref 134–144)
Total Protein: 7.1 g/dL (ref 6.0–8.5)

## 2018-12-26 LAB — LIPID PANEL
Chol/HDL Ratio: 4.1 ratio (ref 0.0–4.4)
Cholesterol, Total: 278 mg/dL — ABNORMAL HIGH (ref 100–199)
HDL: 68 mg/dL (ref >39)
LDL Calculated: 183 mg/dL — ABNORMAL HIGH (ref 0–99)
Triglycerides: 136 mg/dL (ref 0–149)
VLDL Cholesterol Cal: 27 mg/dL (ref 5–40)

## 2018-12-26 LAB — CBC WITH DIFFERENTIAL/PLATELET
Basophils Absolute: 0.1 10*3/uL (ref 0.0–0.2)
Basos: 1 %
EOS (ABSOLUTE): 0.1 10*3/uL (ref 0.0–0.4)
Eos: 1 %
Hematocrit: 44.6 % (ref 34.0–46.6)
Hemoglobin: 14.2 g/dL (ref 11.1–15.9)
Immature Grans (Abs): 0 10*3/uL (ref 0.0–0.1)
Immature Granulocytes: 0 %
Lymphocytes Absolute: 1.8 10*3/uL (ref 0.7–3.1)
Lymphs: 31 %
MCH: 28.7 pg (ref 26.6–33.0)
MCHC: 31.8 g/dL (ref 31.5–35.7)
MCV: 90 fL (ref 79–97)
Monocytes Absolute: 0.4 10*3/uL (ref 0.1–0.9)
Monocytes: 7 %
Neutrophils Absolute: 3.4 10*3/uL (ref 1.4–7.0)
Neutrophils: 60 %
Platelets: 257 10*3/uL (ref 150–450)
RBC: 4.95 x10E6/uL (ref 3.77–5.28)
RDW: 13.7 % (ref 11.7–15.4)
WBC: 5.8 10*3/uL (ref 3.4–10.8)

## 2019-01-17 ENCOUNTER — Other Ambulatory Visit (HOSPITAL_COMMUNITY): Payer: BC Managed Care – PPO

## 2019-01-17 ENCOUNTER — Ambulatory Visit (HOSPITAL_COMMUNITY): Payer: BC Managed Care – PPO

## 2019-01-22 ENCOUNTER — Ambulatory Visit (INDEPENDENT_AMBULATORY_CARE_PROVIDER_SITE_OTHER): Payer: BC Managed Care – PPO | Admitting: Internal Medicine

## 2019-01-22 ENCOUNTER — Ambulatory Visit: Payer: Self-pay

## 2019-01-22 ENCOUNTER — Other Ambulatory Visit: Payer: Self-pay

## 2019-01-22 ENCOUNTER — Encounter: Payer: Self-pay | Admitting: Internal Medicine

## 2019-01-22 DIAGNOSIS — Z20822 Contact with and (suspected) exposure to covid-19: Secondary | ICD-10-CM

## 2019-01-22 DIAGNOSIS — R6889 Other general symptoms and signs: Secondary | ICD-10-CM | POA: Diagnosis not present

## 2019-01-22 NOTE — Telephone Encounter (Signed)
Is patient able to do a virtual visit to discuss

## 2019-01-22 NOTE — Progress Notes (Signed)
Virtual Visit via Video Note  I connected with Jean Smith on 01/22/19 at  3:00 PM EDT by a video enabled telemedicine application and verified that I am speaking with the correct person using two identifiers.  The patient and the provider were at separate locations throughout the entire encounter.   I discussed the limitations of evaluation and management by telemedicine and the availability of in person appointments. The patient expressed understanding and agreed to proceed.  History of Present Illness: The patient is a 68 y.o. female with visit for lightheadedness, headaches, palpitations. Started at some point in the last several months. Has had heart monitor, echo with cardiology. She is feeling worse in the last week or so. Has not been eating well. Some fatigue and headaches. Denies fevers or chills and is checking temp. Denies known exposure but husband is still working and she has been to [employer] doctor visit and she lives in nursing home community. Overall it is stable. Has tried xanax and her palpitation medication which did not help at all.   Observations/Objective: Appearance: normal, breathing appears normal, no coughing during visit, casual grooming, abdomen does not appear distended, throat normal, mental status is A and O times 3 95/60 BP HR 111  Assessment and Plan: See problem oriented charting  Follow Up Instructions: covid-19 testing and then further evaluation depending on results  Visit time 25 minutes: greater than 50% of that time was spent in face to face counseling and coordination of care with the patient: counseled about need to quarantine until results return, expected course and timeline for results  I discussed the assessment and treatment plan with the patient. The patient was provided an opportunity to ask questions and all were answered. The patient agreed with the plan and demonstrated an understanding of the instructions.   The patient was advised to call  back or seek an in-person evaluation if the symptoms worsen or if the condition fails to improve as anticipated.  Hoyt Koch, MD

## 2019-01-22 NOTE — Assessment & Plan Note (Signed)
Covid-19 testing and informed to quarantine until results return as well as timeline and expected course.

## 2019-01-22 NOTE — Telephone Encounter (Signed)
Noted  

## 2019-01-22 NOTE — Telephone Encounter (Signed)
She already has a virtual visit set up for today at 3p. It is stated in the top part of the note.

## 2019-01-22 NOTE — Telephone Encounter (Signed)
Incoming call from Patient with complaint  Of chest palpitations   And dont feel well . For two weeks.  Headache , felt light lighted, headaches. Complains of headaches . Notes that her blood pressure has been decreasing a little.  Normal for patient is [phone] /60's.  Reviewed protocol. ,transferred to office.  Scheduled appointment.          Reason for Disposition . [1] Chest pain(s) lasting a few seconds AND [2] persists > 3 days  Answer Assessment - Initial Assessment Questions 1. LOCATION: "Where does it hurt?"       Dull pain thru to back 2. RADIATION: "Does the pain go anywhere else?" (e.g., into neck, jaw, arms, back)    back 3. ONSET: "When did the chest pain begin?" (Minutes, hours or days)      Two weeks ago 4. PATTERN "Does the pain come and go, or has it been constant since it started?"  "Does it get worse with exertion?"      Constant  5. DURATION: "How long does it last" (e.g., seconds, minutes, hours)      6. SEVERITY: "How bad is the pain?"  (e.g., Scale 1-10; mild, moderate, or severe)    - MILD (1-3): doesn't interfere with normal activities     - MODERATE (4-7): interferes with normal activities or awakens from sleep    - SEVERE (8-10): excruciating pain, unable to do any normal activities        mild 7. CARDIAC RISK FACTORS: "Do you have any history of heart problems or risk factors for heart disease?" (e.g., angina, prior heart attack; diabetes, high blood pressure, high cholesterol, smoker, or strong family history of heart disease)     Palpitaions 8. PULMONARY RISK FACTORS: "Do you have any history of lung disease?"  (e.g., blood clots in lung, asthma, emphysema, birth control pills)     Denies feels congested  9. CAUSE: "What do you think is causing the chest pain?"     *No Answer* 10. OTHER SYMPTOMS: "Do you have any other symptoms?" (e.g., dizziness, nausea, vomiting, sweating, fever, difficulty breathing, cough)       difficulty breathing  11. PREGNANCY:  "Is there any chance you are pregnant?" "When was your last menstrual period?"       na  Protocols used: CHEST PAIN-A-AH

## 2019-01-23 LAB — NOVEL CORONAVIRUS, NAA: SARS-CoV-2, NAA: NOT DETECTED

## 2019-03-14 ENCOUNTER — Telehealth: Payer: Self-pay | Admitting: *Deleted

## 2019-03-14 DIAGNOSIS — Z8249 Family history of ischemic heart disease and other diseases of the circulatory system: Secondary | ICD-10-CM

## 2019-03-14 DIAGNOSIS — E785 Hyperlipidemia, unspecified: Secondary | ICD-10-CM

## 2019-03-14 DIAGNOSIS — E782 Mixed hyperlipidemia: Secondary | ICD-10-CM

## 2019-03-14 DIAGNOSIS — R9439 Abnormal result of other cardiovascular function study: Secondary | ICD-10-CM

## 2019-03-14 NOTE — Telephone Encounter (Signed)
BMET order placed for this pt on 10/16, as requested to be placed by Mack Guise CT scheduler.  Pt will come in for BMET on 10/16 and Coronary CT is scheduled for 10/30.

## 2019-03-14 NOTE — Telephone Encounter (Signed)
-----   Message from Armando Gang sent at 03/13/2019 12:51 PM EDT ----- Regarding: RESCHEDULE CT Patient was schedule for 03-16-19 - due to family (mother is in [place]) patient has reschedule for 04-13-19.   She is going to need BMP  - I have schedule her for 10-16  need order.

## 2019-03-16 ENCOUNTER — Ambulatory Visit (HOSPITAL_COMMUNITY): Payer: BC Managed Care – PPO

## 2019-03-20 ENCOUNTER — Other Ambulatory Visit: Payer: Self-pay

## 2019-03-20 DIAGNOSIS — Z20828 Contact with and (suspected) exposure to other viral communicable diseases: Secondary | ICD-10-CM | POA: Diagnosis not present

## 2019-03-20 DIAGNOSIS — Z20822 Contact with and (suspected) exposure to covid-19: Secondary | ICD-10-CM

## 2019-03-22 LAB — NOVEL CORONAVIRUS, NAA: SARS-CoV-2, NAA: NOT DETECTED

## 2019-03-26 ENCOUNTER — Other Ambulatory Visit: Payer: Self-pay

## 2019-03-26 DIAGNOSIS — Z20828 Contact with and (suspected) exposure to other viral communicable diseases: Secondary | ICD-10-CM | POA: Diagnosis not present

## 2019-03-26 DIAGNOSIS — Z20822 Contact with and (suspected) exposure to covid-19: Secondary | ICD-10-CM

## 2019-03-27 LAB — NOVEL CORONAVIRUS, NAA: SARS-CoV-2, NAA: NOT DETECTED

## 2019-03-30 ENCOUNTER — Other Ambulatory Visit: Payer: BC Managed Care – PPO

## 2019-04-02 ENCOUNTER — Other Ambulatory Visit: Payer: Self-pay

## 2019-04-02 DIAGNOSIS — Z20822 Contact with and (suspected) exposure to covid-19: Secondary | ICD-10-CM

## 2019-04-03 ENCOUNTER — Telehealth: Payer: Self-pay

## 2019-04-03 NOTE — Telephone Encounter (Signed)
This message is not complete. I am not sure what it means.

## 2019-04-03 NOTE — Telephone Encounter (Signed)
Copied from [place] [id number]. Topic: General - Inquiry >> Apr 03, 2019  3:46 PM Mathis Bud wrote: Reason for CRM: Patient states she needs cancer screening and patient states she needs to see a referral. Call back [phone]

## 2019-04-03 NOTE — Telephone Encounter (Signed)
Called patient back was wanting a colonoscopy referral. Looks like she sees a doctor upstair told her to give them a call and schedule with them. Patient stated understanding

## 2019-04-04 LAB — NOVEL CORONAVIRUS, NAA: SARS-CoV-2, NAA: NOT DETECTED

## 2019-04-05 ENCOUNTER — Other Ambulatory Visit: Payer: Self-pay

## 2019-04-05 ENCOUNTER — Ambulatory Visit (INDEPENDENT_AMBULATORY_CARE_PROVIDER_SITE_OTHER): Payer: BC Managed Care – PPO

## 2019-04-05 DIAGNOSIS — Z23 Encounter for immunization: Secondary | ICD-10-CM

## 2019-04-09 ENCOUNTER — Other Ambulatory Visit: Payer: Self-pay

## 2019-04-09 DIAGNOSIS — Z20822 Contact with and (suspected) exposure to covid-19: Secondary | ICD-10-CM

## 2019-04-11 LAB — NOVEL CORONAVIRUS, NAA: SARS-CoV-2, NAA: NOT DETECTED

## 2019-04-13 ENCOUNTER — Other Ambulatory Visit (HOSPITAL_COMMUNITY): Payer: BC Managed Care – PPO

## 2019-04-16 ENCOUNTER — Other Ambulatory Visit: Payer: Self-pay

## 2019-04-16 DIAGNOSIS — L738 Other specified follicular disorders: Secondary | ICD-10-CM | POA: Diagnosis not present

## 2019-04-16 DIAGNOSIS — L821 Other seborrheic keratosis: Secondary | ICD-10-CM | POA: Diagnosis not present

## 2019-04-16 DIAGNOSIS — C44519 Basal cell carcinoma of skin of other part of trunk: Secondary | ICD-10-CM | POA: Diagnosis not present

## 2019-04-16 DIAGNOSIS — L700 Acne vulgaris: Secondary | ICD-10-CM | POA: Diagnosis not present

## 2019-04-16 DIAGNOSIS — D2271 Melanocytic nevi of right lower limb, including hip: Secondary | ICD-10-CM | POA: Diagnosis not present

## 2019-04-16 DIAGNOSIS — Z20822 Contact with and (suspected) exposure to covid-19: Secondary | ICD-10-CM

## 2019-04-16 DIAGNOSIS — C44319 Basal cell carcinoma of skin of other parts of face: Secondary | ICD-10-CM | POA: Diagnosis not present

## 2019-04-17 ENCOUNTER — Ambulatory Visit (AMBULATORY_SURGERY_CENTER): Payer: BC Managed Care – PPO | Admitting: *Deleted

## 2019-04-17 ENCOUNTER — Other Ambulatory Visit: Payer: Self-pay

## 2019-04-17 ENCOUNTER — Encounter: Payer: Self-pay | Admitting: Gastroenterology

## 2019-04-17 VITALS — Temp 98.1°F | Ht 63.5 in | Wt 155.0 lb

## 2019-04-17 DIAGNOSIS — Z86010 Personal history of colonic polyps: Secondary | ICD-10-CM

## 2019-04-17 DIAGNOSIS — Z1159 Encounter for screening for other viral diseases: Secondary | ICD-10-CM

## 2019-04-17 MED ORDER — SUPREP BOWEL PREP KIT 17.5-3.13-1.6 GM/177ML PO SOLN: 1.0000 | Freq: Once | ORAL | 0 refills | Status: AC

## 2019-04-17 NOTE — Progress Notes (Signed)
No egg or soy allergy known to patient  No issues with past sedation with any surgeries  or procedures, no intubation problems  No diet pills per patient No home 02 use per patient  No blood thinners per patient  Pt denies issues with constipation  No A fib or A flutter  EMMI video sent to pt's e mail   Due to the COVID-19 pandemic we are asking patients to follow these guidelines. Please only bring one care partner. Please be aware that your care partner may wait in the car in the parking lot or if they feel like they will be too hot to wait in the car, they may wait in the lobby on the 4th floor. All care partners are required to wear a mask the entire time (we do not have any that we can provide them), they need to practice social distancing, and we will do a Covid check for all patient's and care partners when you arrive. Also we will check their temperature and your temperature. If the care partner waits in their car they need to stay in the parking lot the entire time and we will call them on their cell phone when the patient is ready for discharge so they can bring the car to the front of the building. Also all patient's will need to wear a mask into building.  Covid test 11-11 WED at 1125 am- colon 11-16 Monday

## 2019-04-18 ENCOUNTER — Other Ambulatory Visit: Payer: Self-pay | Admitting: Internal Medicine

## 2019-04-18 LAB — NOVEL CORONAVIRUS, NAA: SARS-CoV-2, NAA: NOT DETECTED

## 2019-04-19 NOTE — Telephone Encounter (Signed)
Control database checked last refill: 03/16/2018 90 tabs LOV: 01/22/2019 acute CA:[id number]

## 2019-04-20 ENCOUNTER — Telehealth: Payer: Self-pay | Admitting: *Deleted

## 2019-04-20 ENCOUNTER — Ambulatory Visit: Payer: BC Managed Care – PPO | Admitting: Cardiology

## 2019-04-20 NOTE — Telephone Encounter (Signed)
  CT CORONARY MORPH W/CTA COR W/SCORE W/CA W/CM &/OR WO/CM Received: 1 week ago Message Contents  Jerlyn Ly, LPN        [id number] patient cancel test schedule for 04-13-19 -unable to do at this time taking care of my mother - will discuss with nelson @ next meeting 04-20-19/sent message to nurse to see if nelson want to cancel the test.      Pt cancelled her appt today with Dr. Meda Coffee.  Noted in appt notes, pt stated she had too much going on right now.

## 2019-04-24 ENCOUNTER — Other Ambulatory Visit: Payer: Self-pay

## 2019-04-24 DIAGNOSIS — Z20822 Contact with and (suspected) exposure to covid-19: Secondary | ICD-10-CM

## 2019-04-25 ENCOUNTER — Ambulatory Visit (INDEPENDENT_AMBULATORY_CARE_PROVIDER_SITE_OTHER): Payer: BC Managed Care – PPO

## 2019-04-25 ENCOUNTER — Other Ambulatory Visit: Payer: Self-pay | Admitting: Gastroenterology

## 2019-04-25 DIAGNOSIS — Z1159 Encounter for screening for other viral diseases: Secondary | ICD-10-CM

## 2019-04-26 LAB — SARS CORONAVIRUS 2 (TAT 6-24 HRS): SARS Coronavirus 2: NEGATIVE

## 2019-04-27 LAB — NOVEL CORONAVIRUS, NAA: SARS-CoV-2, NAA: NOT DETECTED

## 2019-04-30 ENCOUNTER — Ambulatory Visit (AMBULATORY_SURGERY_CENTER): Payer: BC Managed Care – PPO | Admitting: Gastroenterology

## 2019-04-30 ENCOUNTER — Other Ambulatory Visit: Payer: Self-pay

## 2019-04-30 ENCOUNTER — Encounter: Payer: Self-pay | Admitting: Gastroenterology

## 2019-04-30 VITALS — BP 117/71 | HR 79 | Temp 97.8°F | Resp 21 | Ht 63.0 in | Wt 155.0 lb

## 2019-04-30 DIAGNOSIS — Z1211 Encounter for screening for malignant neoplasm of colon: Secondary | ICD-10-CM | POA: Diagnosis not present

## 2019-04-30 DIAGNOSIS — Z86010 Personal history of colonic polyps: Secondary | ICD-10-CM | POA: Diagnosis not present

## 2019-04-30 DIAGNOSIS — Z800 Family history of malignant neoplasm of digestive organs: Secondary | ICD-10-CM

## 2019-04-30 DIAGNOSIS — K635 Polyp of colon: Secondary | ICD-10-CM

## 2019-04-30 DIAGNOSIS — D122 Benign neoplasm of ascending colon: Secondary | ICD-10-CM

## 2019-04-30 MED ORDER — SODIUM CHLORIDE 0.9 % IV SOLN: 500.0000 mL | INTRAVENOUS | Status: DC

## 2019-04-30 NOTE — Progress Notes (Signed)
Report given to PACU, vss 

## 2019-04-30 NOTE — Op Note (Signed)
[facility] Patient Name: Jean Smith Procedure Date: 04/30/2019 11:33 AM MRN: [id number] Endoscopist: Mauri Pole , MD Age: 69 Referring MD:  Date of Birth: 04/29/1951 Gender: Female Account #: [id number] Procedure:                Colonoscopy Indications:              Screening in patient at increased risk: Colorectal                            cancer in mother 42 or older, High risk colon                            cancer surveillance: Personal history of adenoma                            (10 mm or greater in size) Medicines:                Monitored Anesthesia Care Procedure:                Pre-Anesthesia Assessment:                           - Prior to the procedure, a History and Physical                            was performed, and patient medications and                            allergies were reviewed. The patient's tolerance of                            previous anesthesia was also reviewed. The risks                            and benefits of the procedure and the sedation                            options and risks were discussed with the patient.                            All questions were answered, and informed consent                            was obtained. Prior Anticoagulants: The patient has                            taken no previous anticoagulant or antiplatelet                            agents. ASA Grade Assessment: II - A patient with                            mild systemic disease. After reviewing the risks  and benefits, the patient was deemed in                            satisfactory condition to undergo the procedure.                           After obtaining informed consent, the colonoscope                            was passed under direct vision. Throughout the                            procedure, the patient's blood pressure, pulse, and                            oxygen saturations were  monitored continuously. The                            Colonoscope was introduced through the anus and                            advanced to the the cecum, identified by                            appendiceal orifice and ileocecal valve. The                            colonoscopy was performed without difficulty. The                            patient tolerated the procedure well. The quality                            of the bowel preparation was good. The ileocecal                            valve, appendiceal orifice, and rectum were                            photographed. Scope In: 11:41:01 AM Scope Out: [id number] AM Scope Withdrawal Time: 0 hours 8 minutes 27 seconds  Total Procedure Duration: 0 hours 15 minutes 43 seconds  Findings:                 The perianal and digital rectal examinations were                            normal.                           A 10 mm polyp was found in the ascending colon. The                            polyp was sessile. The polyp was removed with a  cold snare. Resection and retrieval were complete.                           Scattered small and large-mouthed diverticula were                            found in the sigmoid colon, descending colon,                            transverse colon, ascending colon and cecum.                           Non-bleeding internal hemorrhoids were found during                            retroflexion. The hemorrhoids were small.                           The exam was otherwise without abnormality. Complications:            No immediate complications. Estimated Blood Loss:     Estimated blood loss was minimal. Impression:               - One 10 mm polyp in the ascending colon, removed                            with a cold snare. Resected and retrieved.                           - Severe diverticulosis in the sigmoid colon, in                            the descending colon, in the  transverse colon, in                            the ascending colon and in the cecum.                           - Non-bleeding internal hemorrhoids.                           - The examination was otherwise normal. Recommendation:           - Patient has a contact number available for                            emergencies. The signs and symptoms of potential                            delayed complications were discussed with the                            patient. Return to normal activities tomorrow.  Written discharge instructions were provided to the                            patient.                           - Resume previous diet.                           - Continue present medications.                           - Await pathology results.                           - Repeat colonoscopy in 3 years for surveillance                            based on pathology results. Mauri Pole, MD 04/30/2019 12:02:49 PM This report has been signed electronically.

## 2019-04-30 NOTE — Progress Notes (Signed)
Temp LC V/s CW I have reviewed the patient's medical history in detail and updated the computerized patient record. 

## 2019-04-30 NOTE — Patient Instructions (Signed)
Non-bleeding internal hemorrhoids Severe diverticulosis One polyp removed Repeat colonoscopy in 3 years.   YOU HAD AN ENDOSCOPIC PROCEDURE TODAY AT [facility]:   Refer to the procedure report that was given to you for any specific questions about what was found during the examination.  If the procedure report does not answer your questions, please call your gastroenterologist to clarify.  If you requested that your care partner not be given the details of your procedure findings, then the procedure report has been included in a sealed envelope for you to review at your convenience later.  YOU SHOULD EXPECT: Some feelings of bloating in the abdomen. Passage of more gas than usual.  Walking can help get rid of the air that was put into your GI tract during the procedure and reduce the bloating. If you had a lower endoscopy (such as a colonoscopy or flexible sigmoidoscopy) you may notice spotting of blood in your stool or on the toilet paper. If you underwent a bowel prep for your procedure, you may not have a normal bowel movement for a few days.  Please Note:  You might notice some irritation and congestion in your nose or some drainage.  This is from the oxygen used during your procedure.  There is no need for concern and it should clear up in a day or so.  SYMPTOMS TO REPORT IMMEDIATELY:   Following lower endoscopy (colonoscopy or flexible sigmoidoscopy):  Excessive amounts of blood in the stool  Significant tenderness or worsening of abdominal pains  Swelling of the abdomen that is new, acute  Fever of 100F or higher   Following upper endoscopy (EGD)  Vomiting of blood or coffee ground material  New chest pain or pain under the shoulder blades  Painful or persistently difficult swallowing  New shortness of breath  Fever of 100F or higher  Black, tarry-looking stools  For urgent or emergent issues, a gastroenterologist can be reached at any hour by calling [phone]  [phone].   DIET:  We do recommend a small meal at first, but then you may proceed to your regular diet.  Drink plenty of fluids but you should avoid alcoholic beverages for 24 hours.  ACTIVITY:  You should plan to take it easy for the rest of today and you should NOT DRIVE or use heavy machinery until tomorrow (because of the sedation medicines used during the test).    FOLLOW UP: Our staff will call the number listed on your records 48-72 hours following your procedure to check on you and address any questions or concerns that you may have regarding the information given to you following your procedure. If we do not reach you, we will leave a message.  We will attempt to reach you two times.  During this call, we will ask if you have developed any symptoms of COVID 19. If you develop any symptoms (ie: fever, flu-like symptoms, shortness of breath, cough etc.) before then, please call [phone].  If you test positive for Covid 19 in the 2 weeks post procedure, please call and report this information to [place].    If any biopsies were taken you will be contacted by phone or by letter within the next 1-3 weeks.  Please call us at [phone] if you have not heard about the biopsies in 3 weeks.    SIGNATURES/CONFIDENTIALITY: You and/or your care partner have signed paperwork which will be entered into your electronic medical record.  These signatures attest to the fact  that that the information above on your After Visit Summary has been reviewed and is understood.  Full responsibility of the confidentiality of this discharge information lies with you and/or your care-partner.

## 2019-05-02 ENCOUNTER — Telehealth: Payer: Self-pay

## 2019-05-02 NOTE — Telephone Encounter (Signed)
  Follow up Call-  Call back number 04/30/2019 09/19/2017  Post procedure Call Back phone  # [phone]  Permission to leave phone message Yes Yes  Some recent data might be hidden     Patient questions:  Do you have a fever, pain , or abdominal swelling? No. Pain Score  0 *  Have you tolerated food without any problems? Yes.    Have you been able to return to your normal activities? Yes.    Do you have any questions about your discharge instructions: Diet   No. Medications  No. Follow up visit  No.  Do you have questions or concerns about your Care? No.  Actions: * If pain score is 4 or above: No action needed, pain <4.  1. Have you developed a fever since your procedure? no  2.   Have you had an respiratory symptoms (SOB or cough) since your procedure? no  3.   Have you tested positive for COVID 19 since your procedure? no  4.   Have you had any family members/close contacts diagnosed with the COVID 19 since your procedure?  no   If yes to any of these questions please route to Joylene John, RN and Alphonsa Gin, Therapist, sports.

## 2019-05-04 ENCOUNTER — Other Ambulatory Visit: Payer: Self-pay

## 2019-05-04 DIAGNOSIS — Z20822 Contact with and (suspected) exposure to covid-19: Secondary | ICD-10-CM

## 2019-05-07 LAB — NOVEL CORONAVIRUS, NAA: SARS-CoV-2, NAA: NOT DETECTED

## 2019-05-08 ENCOUNTER — Encounter: Payer: Self-pay | Admitting: Gastroenterology

## 2019-05-08 ENCOUNTER — Other Ambulatory Visit: Payer: Self-pay

## 2019-05-08 DIAGNOSIS — Z20822 Contact with and (suspected) exposure to covid-19: Secondary | ICD-10-CM

## 2019-05-09 LAB — NOVEL CORONAVIRUS, NAA: SARS-CoV-2, NAA: NOT DETECTED

## 2019-05-16 ENCOUNTER — Other Ambulatory Visit: Payer: Self-pay

## 2019-05-16 DIAGNOSIS — Z20822 Contact with and (suspected) exposure to covid-19: Secondary | ICD-10-CM

## 2019-05-19 LAB — NOVEL CORONAVIRUS, NAA: SARS-CoV-2, NAA: NOT DETECTED

## 2019-05-24 ENCOUNTER — Other Ambulatory Visit: Payer: Self-pay

## 2019-05-24 DIAGNOSIS — Z20822 Contact with and (suspected) exposure to covid-19: Secondary | ICD-10-CM

## 2019-05-26 LAB — NOVEL CORONAVIRUS, NAA: SARS-CoV-2, NAA: NOT DETECTED

## 2019-05-29 ENCOUNTER — Ambulatory Visit: Payer: BC Managed Care – PPO | Admitting: Cardiology

## 2019-05-30 ENCOUNTER — Encounter: Payer: Self-pay | Admitting: Physician Assistant

## 2019-05-30 ENCOUNTER — Telehealth (INDEPENDENT_AMBULATORY_CARE_PROVIDER_SITE_OTHER): Payer: BC Managed Care – PPO | Admitting: Physician Assistant

## 2019-05-30 ENCOUNTER — Other Ambulatory Visit: Payer: Self-pay

## 2019-05-30 VITALS — BP 120/71 | HR 82 | Ht 63.0 in | Wt 151.0 lb

## 2019-05-30 DIAGNOSIS — I472 Ventricular tachycardia: Secondary | ICD-10-CM | POA: Diagnosis not present

## 2019-05-30 DIAGNOSIS — I340 Nonrheumatic mitral (valve) insufficiency: Secondary | ICD-10-CM

## 2019-05-30 DIAGNOSIS — Z8249 Family history of ischemic heart disease and other diseases of the circulatory system: Secondary | ICD-10-CM

## 2019-05-30 DIAGNOSIS — I4729 Other ventricular tachycardia: Secondary | ICD-10-CM

## 2019-05-30 DIAGNOSIS — I341 Nonrheumatic mitral (valve) prolapse: Secondary | ICD-10-CM

## 2019-05-30 DIAGNOSIS — E782 Mixed hyperlipidemia: Secondary | ICD-10-CM

## 2019-05-30 NOTE — Progress Notes (Signed)
Virtual Visit via Telephone Note   This visit type was conducted due to national recommendations for restrictions regarding the COVID-19 Pandemic (e.g. social distancing) in an effort to limit this patient's exposure and mitigate transmission in our community.  Due to her co-morbid illnesses, this patient is at least at moderate risk for complications without adequate follow up.  This format is felt to be most appropriate for this patient at this time.  The patient did not have access to video technology/had technical difficulties with video requiring transitioning to audio format only (telephone).  All issues noted in this document were discussed and addressed.  No physical exam could be performed with this format.  Please refer to the patient's chart for her  consent to telehealth for [facility].   Date:  05/30/2019   ID:  Jean Smith, DOB [birth year], MRN [id number]  Patient Location: Home Provider Location: Home  PCP:  Hoyt Koch, MD  Cardiologist:  Ena Dawley, MD  Electrophysiologist:  None   Evaluation Performed:  Follow-Up Visit  Chief Complaint: NSVT  History of Present Illness:    Jean Smith is a 68 y.o. female with  PVCs  NSVT (RVOT VT)  Valvular heart disease  Echocardiogram 11/2018:  MVP w mild to mod MR, mild to mod AI  Hyperlipidemia   FHx CAD  She was previously followed by Dr. Acie Fredrickson.  She established with Dr. Meda Coffee in 09/2018.  She had an event monitor obtained secondary to palpitations.  This demonstrated 25 episodes of nonsustained ventricular tachycardia.  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  She was last seen by Dr. Meda Coffee in July 2020.  She recommended proceeding with coronary CTA to evaluate for ischemia.  However, this test has not yet been done.  Today, she notes continued episodes of palpitations.  She describes this as a fluttering.  Overall, there are no more frequent than they have been in the past.  Stress seems  to bring them on more.  She has not had syncope, chest pain, shortness of breath, orthopnea, leg swelling.  She has significant side effects to beta-blockers.  She only takes propranolol if she absolutely needs it for more significant palpitations.    Past Medical History:  Diagnosis Date  . Allergy   . Anxiety   . Arthritis   . Cataract    bilateral - MD is just watching   . Diverticulosis   . Fuchs' endothelial dystrophy    follows with optho regularly   . Gestational diabetes   . Heart murmur    MVP   . History of depression   . HSV-2 infection   . Hyperlipidemia    no meds - diet controlled  . Hyperplastic colon polyp   . Mitral valve prolapse   . PVC's (premature ventricular contractions)    intol of BB, sxc palpitations r/t stress  . RVOT ventricular tachycardia/PVCs    EP eval 01/2013 for freq PVCs   Past Surgical History:  Procedure Laterality Date  . CESAREAN SECTION     x 3  . COLONOSCOPY    . KNEE SURGERY Right   . MANDIBLE SURGERY     right side in front of ear  . POLYPECTOMY    . WISDOM TOOTH EXTRACTION       Current Meds  Medication Sig  . ALPRAZolam (XANAX) 0.25 MG tablet TAKE 1 TABLET BY MOUTH THREE TIMES A DAY  . b complex vitamins tablet Take 1 tablet by mouth  daily.  . ibuprofen (ADVIL,MOTRIN) 200 MG tablet Take 200 mg by mouth 2 (two) times daily as needed.  . loratadine (CLARITIN) 10 MG tablet Take 10 mg by mouth daily as needed for allergies.   . Magnesium Bisglycinate Dihyd POWD Take 1 tablet by mouth 2 (two) times daily.   . propranolol (INDERAL) 10 MG tablet Take 1 tablet (10 mg total) by mouth daily. As needed for palpitations. Please make overdue appt with Dr. Acie Fredrickson. 3rd and final attempt  . [DISCONTINUED] metoprolol tartrate (LOPRESSOR) 50 MG tablet Take 1 tablet (50 mg total) by mouth once for 1 dose. Take 2 hours prior to your Coronary CT.     Allergies:   Atorvastatin, Crestor [rosuvastatin calcium], Latex, Lipitor [atorvastatin  calcium], Other, Pollen extract, and Vitamin d analogs   Social History   Tobacco Use  . Smoking status: Former Smoker    Types: Cigarettes    Quit date: 06/15/1983    Years since quitting: 35.9  . Smokeless tobacco: Never Used  Substance Use Topics  . Alcohol use: Yes    Comment: occasional  . Drug use: No     Family Hx: The patient's family history includes Alcohol abuse in an other family member; Colon cancer in her maternal grandmother and mother; Colon polyps in her sister and sister; Heart disease in her father. There is no history of Rectal cancer, Stomach cancer, or Esophageal cancer.  ROS:   Please see the history of present illness.     All other systems reviewed and are negative.   Prior CV studies:   The following studies were reviewed today:   Echocardiogram 11/29/2018 EF 60-65, mild LVH, grade 1 diastolic dysfunction, normal wall motion, normal RV SF, myxomatous mitral valve with moderate MVP and mild to moderate MR, mild to moderate TR, mild to moderate AI  3-14 LT Monitor 11/2018  Sinus bradycardia to sinus tachycardia.  Frequent PACs and PVCs in a pattern of bigeminy, trigeminy and short runs of nsVT, the longest lasting 5 beats  No pauses.  Sinus bradycardia to sinus tachycardia. Frequent PACs and PVCs in a pattern of bigeminy, trigeminy and short runs of nsVT, the longest lasting 5 beats. Short runs of SVTs.  Carotid US 04/04/2013 Normal carotid arteries bilaterally  Myoview 12/21/2012 No ischemia  Stress echocardiogram 12/06/2012 - ECG with LVH and PVC;s no ischemia With exercise basal  function improves but septum and apex appear hypokinetic  Consider futhrer testing    Labs/Other Tests and Data Reviewed:    EKG:  No ECG reviewed.  Recent Labs: 12/25/2018: ALT 14; BUN 16; Creatinine, Ser 0.73; Hemoglobin 14.2; Platelets 257; Potassium 4.5; Sodium 140; TSH 1.770   Recent Lipid Panel Lab Results  Component Value Date/Time   CHOL 278 (H)  12/25/2018 12:04 PM   TRIG 136 12/25/2018 12:04 PM   HDL 68 12/25/2018 12:04 PM   CHOLHDL 4.1 12/25/2018 12:04 PM   CHOLHDL 4 01/02/2018 02:59 PM   LDLCALC 183 (H) 12/25/2018 12:04 PM   LDLDIRECT 168.0 01/02/2018 02:59 PM    Wt Readings from Last 3 Encounters:  05/30/19 151 lb (68.5 kg)  04/30/19 155 lb (70.3 kg)  04/17/19 155 lb (70.3 kg)     Objective:    Vital Signs:  BP 120/71   Pulse 82   Ht 5\' 3"  (1.6 m)   Wt 151 lb (68.5 kg)   BMI 26.75 kg/m    VITAL SIGNS:  reviewed GEN:  no acute distress RESPIRATORY:  Normal respiratory  effort NEURO:  Alert and oriented PSYCH:  Normal mood  ASSESSMENT & PLAN:    1. RVOT ventricular tachycardia/PVCs Event monitor in June 2020 demonstrated PVCs in a pattern of bigeminy and trigeminy and 25 episodes of nonsustained ventricular tachycardia.  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  She has not had syncope.  She is intolerant of beta-blockers.  She only takes propranolol if absolutely necessary.  Proceed with work-up as previously planned by Dr. Meda Coffee (see below).  We could consider placing her on verapamil in the future.  2. Family history of early CAD As noted, she has a history of nonsustained ventricular tachycardia.  She has significant family history of CAD and untreated hyperlipidemia.  Previously Dr. Meda Coffee wanted her to undergo coronary CTA to evaluate for ischemia.  This was not scheduled due to restrictions related to COVID-19.  I have recommended the patient go ahead and proceed with this test.  Our office will arrange.  We will also arrange follow-up with Dr. Meda Coffee after her CTA.  3. Mixed hyperlipidemia Management of hyperlipidemia will be based upon results of coronary CTA.  4. MVP (mitral valve prolapse) 5. Mitral valve insufficiency, unspecified etiology Mild to moderate mitral regurgitation by echocardiogram June 2020.     Time:   Today, I have spent 21 minutes with the patient with telehealth technology  discussing the above problems.     Medication Adjustments/Labs and Tests Ordered: Current medicines are reviewed at length with the patient today.  Concerns regarding medicines are outlined above.   Tests Ordered: No orders of the defined types were placed in this encounter.   Medication Changes: No orders of the defined types were placed in this encounter.   Follow Up:  In Person in 3 month(s)  Signed, Richardson Dopp, PA-C  05/30/2019 5:49 PM    [place]

## 2019-05-30 NOTE — Patient Instructions (Signed)
Medication Instructions:   Your physician recommends that you continue on your current medications as directed. Please refer to the Current Medication list given to you today.  *If you need a refill on your cardiac medications before your next appointment, please call your pharmacy*  Lab Work:  None ordered today  Testing/Procedures:  Schedule CT scan that Dr. Meda Coffee ordered in July.  Follow-Up: At [facility], you and your health needs are our priority.  As part of our continuing mission to provide you with exceptional heart care, we have created designated Provider Care Teams.  These Care Teams include your primary Cardiologist (physician) and Advanced Practice Providers (APPs -  Physician Assistants and Nurse Practitioners) who all work together to provide you with the care you need, when you need it.  Your next appointment:   3 month(s)  The format for your next appointment:   In Person  Provider:   Ena Dawley, MD

## 2019-05-31 ENCOUNTER — Ambulatory Visit: Payer: BC Managed Care – PPO | Attending: Internal Medicine

## 2019-05-31 DIAGNOSIS — Z20822 Contact with and (suspected) exposure to covid-19: Secondary | ICD-10-CM

## 2019-05-31 DIAGNOSIS — Z20828 Contact with and (suspected) exposure to other viral communicable diseases: Secondary | ICD-10-CM | POA: Diagnosis not present

## 2019-06-01 LAB — NOVEL CORONAVIRUS, NAA: SARS-CoV-2, NAA: NOT DETECTED

## 2019-06-07 ENCOUNTER — Ambulatory Visit: Payer: BC Managed Care – PPO | Attending: Internal Medicine

## 2019-06-07 DIAGNOSIS — Z20822 Contact with and (suspected) exposure to covid-19: Secondary | ICD-10-CM

## 2019-06-07 DIAGNOSIS — Z20828 Contact with and (suspected) exposure to other viral communicable diseases: Secondary | ICD-10-CM | POA: Diagnosis not present

## 2019-06-08 LAB — NOVEL CORONAVIRUS, NAA: SARS-CoV-2, NAA: NOT DETECTED

## 2019-06-13 ENCOUNTER — Telehealth: Payer: Self-pay | Admitting: *Deleted

## 2019-06-13 NOTE — Telephone Encounter (Signed)
Cardiac ct Received: 2 days ago Message Contents  Cam Hai, LPN; Lorenza Evangelist, RN  Good morning patient is scheduled for Jan 27,2021 1230p , she is already scheduled for her lab work on 07/04/19    Pts Coronary CT is scheduled for 07/11/19 at 12:30 pm.  Pt to come in for lab prior to CT on 07/04/19.  Pt made aware of appt dates and times by CT scheduler Howie Ill.

## 2019-06-14 ENCOUNTER — Ambulatory Visit: Payer: BC Managed Care – PPO | Attending: Internal Medicine

## 2019-06-14 DIAGNOSIS — U071 COVID-19: Secondary | ICD-10-CM | POA: Diagnosis not present

## 2019-06-14 DIAGNOSIS — R238 Other skin changes: Secondary | ICD-10-CM

## 2019-06-15 HISTORY — PX: MOHS SURGERY: SHX181

## 2019-06-16 LAB — NOVEL CORONAVIRUS, NAA: SARS-CoV-2, NAA: NOT DETECTED

## 2019-06-19 DIAGNOSIS — L910 Hypertrophic scar: Secondary | ICD-10-CM | POA: Diagnosis not present

## 2019-06-19 DIAGNOSIS — C4441 Basal cell carcinoma of skin of scalp and neck: Secondary | ICD-10-CM | POA: Diagnosis not present

## 2019-06-19 DIAGNOSIS — Z85828 Personal history of other malignant neoplasm of skin: Secondary | ICD-10-CM | POA: Diagnosis not present

## 2019-06-21 ENCOUNTER — Ambulatory Visit: Payer: BC Managed Care – PPO | Attending: Internal Medicine

## 2019-06-21 DIAGNOSIS — Z20822 Contact with and (suspected) exposure to covid-19: Secondary | ICD-10-CM | POA: Diagnosis not present

## 2019-06-23 LAB — NOVEL CORONAVIRUS, NAA: SARS-CoV-2, NAA: NOT DETECTED

## 2019-06-26 ENCOUNTER — Ambulatory Visit: Payer: Self-pay | Admitting: *Deleted

## 2019-06-26 ENCOUNTER — Ambulatory Visit: Payer: BC Managed Care – PPO | Attending: Internal Medicine

## 2019-06-26 DIAGNOSIS — Z20822 Contact with and (suspected) exposure to covid-19: Secondary | ICD-10-CM

## 2019-06-26 NOTE — Telephone Encounter (Signed)
No appointment has been scheduled that I can see.

## 2019-06-26 NOTE — Telephone Encounter (Signed)
Contacted pt to notify able to overbook appt; pt offered and accepted appt at [facility] site 06/26/19 at 1530; previous appt for 06/27/19 cancelled.

## 2019-06-26 NOTE — Telephone Encounter (Signed)
Per inititial encounter, " Patient called with complaints of headache, body aches and congestion. Patient is requesting call back from RN to discuss."; contacted pt and she states that she has been tested numerous times for COVID but this is the first time she has felt bad; the pt was able to schedule appt 06/27/19 but states she was told that there was a possibility that she could get an overbooked appt today; she is concerned that her mother is in hospice, and she has been around her mother and sisters; the pt is seen by Dr Sharlet Salina, [place].  Reason for Disposition . Question about upcoming scheduled test, no triage required and triager able to answer question  Answer Assessment - Initial Assessment Questions 1. REASON FOR CALL or QUESTION: "What is your reason for calling today?" or "How can I best help you?" or "What question do you have that I can help answer?"     Appointment for COVID testing  Protocols used: [place]

## 2019-06-27 ENCOUNTER — Other Ambulatory Visit: Payer: BC Managed Care – PPO

## 2019-06-27 ENCOUNTER — Telehealth: Payer: Self-pay | Admitting: Family Medicine

## 2019-06-27 LAB — NOVEL CORONAVIRUS, NAA: SARS-CoV-2, NAA: DETECTED — AB

## 2019-06-27 NOTE — Telephone Encounter (Signed)
Patient called about Positive Covid test from date testing event. The patient has mild upper respiratory symptoms and no fever. Symptoms are mild at this time. Patient understands the needs to stay in isolation for a total of 10 days from onset of symptom or 14 days total from date of testing if no symptom. Patient knows the health department may be in touch.  I answered all of patient's questions and all concerns addressed.   Donia Pounds  APRN, MSN, FNP-C Patient [facility] [address] [place], [place] [address] [phone]

## 2019-06-28 ENCOUNTER — Other Ambulatory Visit: Payer: BC Managed Care – PPO

## 2019-07-03 ENCOUNTER — Telehealth: Payer: Self-pay | Admitting: *Deleted

## 2019-07-03 DIAGNOSIS — E785 Hyperlipidemia, unspecified: Secondary | ICD-10-CM

## 2019-07-03 DIAGNOSIS — Z8249 Family history of ischemic heart disease and other diseases of the circulatory system: Secondary | ICD-10-CM

## 2019-07-03 DIAGNOSIS — I472 Ventricular tachycardia: Secondary | ICD-10-CM

## 2019-07-03 DIAGNOSIS — R072 Precordial pain: Secondary | ICD-10-CM

## 2019-07-03 DIAGNOSIS — I341 Nonrheumatic mitral (valve) prolapse: Secondary | ICD-10-CM

## 2019-07-03 DIAGNOSIS — E782 Mixed hyperlipidemia: Secondary | ICD-10-CM

## 2019-07-03 DIAGNOSIS — I4729 Other ventricular tachycardia: Secondary | ICD-10-CM

## 2019-07-03 DIAGNOSIS — R002 Palpitations: Secondary | ICD-10-CM

## 2019-07-03 NOTE — Telephone Encounter (Signed)
-----   Message from Howie Ill sent at 06/29/2019  1:53 PM EST ----- Regarding: Cardiac Ct Good afternoon Karlene Einstein,   This patient had to cancel her cardiac ct for being Covid Positive - we pushed her out to March 1,2021 , the only problem is that the referral has expired , so I will need a new one put in please.  Thanks  Romie Minus   Corporate treasurer) I will need a new authorization for this patient when the new referral gets put in, her old authorization expired on 06/22/2019 ?

## 2019-07-03 NOTE — Telephone Encounter (Signed)
New Coronary CT order placed on this pt, as requested by the CT Scheduler, due to recent being expired.  Coronary CT is scheduled for 08/13/19.  Pt will come in for a BMET on 08/08/19, per CT protocol.

## 2019-07-04 ENCOUNTER — Other Ambulatory Visit: Payer: BC Managed Care – PPO

## 2019-07-05 ENCOUNTER — Ambulatory Visit: Payer: Self-pay

## 2019-07-05 ENCOUNTER — Other Ambulatory Visit: Payer: Self-pay

## 2019-07-05 ENCOUNTER — Emergency Department (HOSPITAL_COMMUNITY)
Admission: EM | Admit: 2019-07-05 | Discharge: 2019-07-05 | Disposition: A | Discharge status: Home / Self Care | Payer: BC Managed Care – PPO | Attending: Emergency Medicine | Admitting: Emergency Medicine

## 2019-07-05 ENCOUNTER — Emergency Department (HOSPITAL_COMMUNITY): Payer: BC Managed Care – PPO

## 2019-07-05 DIAGNOSIS — R0902 Hypoxemia: Secondary | ICD-10-CM | POA: Diagnosis not present

## 2019-07-05 DIAGNOSIS — R197 Diarrhea, unspecified: Secondary | ICD-10-CM | POA: Diagnosis not present

## 2019-07-05 DIAGNOSIS — R071 Chest pain on breathing: Secondary | ICD-10-CM | POA: Diagnosis not present

## 2019-07-05 DIAGNOSIS — R0789 Other chest pain: Secondary | ICD-10-CM | POA: Insufficient documentation

## 2019-07-05 DIAGNOSIS — U071 COVID-19: Secondary | ICD-10-CM | POA: Diagnosis not present

## 2019-07-05 DIAGNOSIS — R0602 Shortness of breath: Secondary | ICD-10-CM | POA: Diagnosis not present

## 2019-07-05 LAB — CBC WITH DIFFERENTIAL/PLATELET
Abs Immature Granulocytes: 0.03 10*3/uL (ref 0.00–0.07)
Basophils Absolute: 0 10*3/uL (ref 0.0–0.1)
Basophils Relative: 0 %
Eosinophils Absolute: 0 10*3/uL (ref 0.0–0.5)
Eosinophils Relative: 0 %
HCT: 41.3 % (ref 36.0–46.0)
Hemoglobin: 13.7 g/dL (ref 12.0–15.0)
Immature Granulocytes: 0 %
Lymphocytes Relative: 11 %
Lymphs Abs: 0.7 10*3/uL (ref 0.7–4.0)
MCH: 29.2 pg (ref 26.0–34.0)
MCHC: 33.2 g/dL (ref 30.0–36.0)
MCV: 88.1 fL (ref 80.0–100.0)
Monocytes Absolute: 0.4 10*3/uL (ref 0.1–1.0)
Monocytes Relative: 7 %
Neutro Abs: 5.5 10*3/uL (ref 1.7–7.7)
Neutrophils Relative %: 82 %
Platelets: 200 10*3/uL (ref 150–400)
RBC: 4.69 MIL/uL (ref 3.87–5.11)
RDW: 13.4 % (ref 11.5–15.5)
WBC: 6.8 10*3/uL (ref 4.0–10.5)
nRBC: 0 % (ref 0.0–0.2)

## 2019-07-05 LAB — COMPREHENSIVE METABOLIC PANEL
ALT: 16 U/L (ref 0–44)
AST: 23 U/L (ref 15–41)
Albumin: 3.8 g/dL (ref 3.5–5.0)
Alkaline Phosphatase: 52 U/L (ref 38–126)
Anion gap: 12 (ref 5–15)
BUN: 9 mg/dL (ref 8–23)
CO2: 24 mmol/L (ref 22–32)
Calcium: 8.8 mg/dL — ABNORMAL LOW (ref 8.9–10.3)
Chloride: 99 mmol/L (ref 98–111)
Creatinine, Ser: 0.67 mg/dL (ref 0.44–1.00)
GFR calc Af Amer: >60 mL/min (ref >60)
GFR calc non Af Amer: >60 mL/min (ref >60)
Glucose, Bld: 110 mg/dL — ABNORMAL HIGH (ref 70–99)
Potassium: 3.8 mmol/L (ref 3.5–5.1)
Sodium: 135 mmol/L (ref 135–145)
Total Bilirubin: 0.6 mg/dL (ref 0.3–1.2)
Total Protein: 7.4 g/dL (ref 6.5–8.1)

## 2019-07-05 LAB — TRIGLYCERIDES: Triglycerides: 73 mg/dL (ref <150)

## 2019-07-05 LAB — FERRITIN: Ferritin: 151 ng/mL (ref 11–307)

## 2019-07-05 LAB — PROCALCITONIN: Procalcitonin: <0.1 ng/mL

## 2019-07-05 LAB — C-REACTIVE PROTEIN: CRP: 6.8 mg/dL — ABNORMAL HIGH (ref <1.0)

## 2019-07-05 LAB — FIBRINOGEN: Fibrinogen: 539 mg/dL — ABNORMAL HIGH (ref 210–475)

## 2019-07-05 LAB — TROPONIN I (HIGH SENSITIVITY): Troponin I (High Sensitivity): 8 ng/L (ref <18)

## 2019-07-05 LAB — D-DIMER, QUANTITATIVE: D-Dimer, Quant: 0.62 ug/mL-FEU — ABNORMAL HIGH (ref 0.00–0.50)

## 2019-07-05 LAB — LACTATE DEHYDROGENASE: LDH: 187 U/L (ref 98–192)

## 2019-07-05 MED ORDER — KETOROLAC TROMETHAMINE 15 MG/ML IJ SOLN
15.0000 mg | Freq: Once | INTRAMUSCULAR | Status: AC
  Administered 2019-07-05: 15 mg via INTRAVENOUS
  Filled 2019-07-05: qty 1

## 2019-07-05 NOTE — Discharge Instructions (Addendum)
Please return to ER if you have any worsening of your breathing, chest pain or other new concerning symptom.  Please call your primary doctor to get a virtual recheck.  Continue isolation precautions as discussed.

## 2019-07-05 NOTE — ED Provider Notes (Signed)
[facility] DEPT Provider Note   CSN: [id number] Arrival date & time: 07/05/19  [id number]     History No chief complaint on file.   Jean Smith is a 69 y.o. female.  Presents to ER with shortness of breath, chest pain in setting of known COVID-19.  Original positive test was January 12.  Had initially relatively mild upper respiratory symptoms.  Over the past couple days so she is noted to increase shortness of breath, as well as chest discomfort.  Worse with cough, worse with deep breath.  Shortness of breath worse with exertion.  She denies known coronary artery disease, denies prior history PE.  Has continued to have low-grade fevers, 99.5 this morning.  Primary doctor sent her to ER to get CXR.  HPI     Past Medical History:  Diagnosis Date  . Allergy   . Anxiety   . Arthritis   . Cataract    bilateral - MD is just watching   . Diverticulosis   . Fuchs' endothelial dystrophy    follows with optho regularly   . Gestational diabetes   . Heart murmur    MVP   . History of depression   . HSV-2 infection   . Hyperlipidemia    no meds - diet controlled  . Hyperplastic colon polyp   . Mitral valve prolapse   . PVC's (premature ventricular contractions)    intol of BB, sxc palpitations r/t stress  . RVOT ventricular tachycardia/PVCs    EP eval 01/2013 for freq PVCs    Patient Active Problem List   Diagnosis Date Noted  . Suspected COVID-19 virus infection 01/22/2019  . Abdominal pain 07/04/2018  . Urinary urgency 05/05/2018  . Chronic right shoulder pain 05/05/2018  . Vitamin D deficiency 05/05/2018  . Other fatigue 01/02/2018  . Allergic rhinitis 01/02/2018  . Weight gain 01/02/2018  . PVC (premature ventricular contraction) 03/09/2017  . Routine general medical examination at a health care facility 09/01/2015  . Hyperlipidemia 09/01/2015  . Varicose vein 08/27/2014  . Primary localized osteoarthrosis, lower leg 10/30/2013  . Anxiety  state 03/23/2013  . 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 Depression 01/01/2013  . Abnormal stress echo 12/11/2012  . Palpitations 09/14/2010    Past Surgical History:  Procedure Laterality Date  . CESAREAN SECTION     x 3  . COLONOSCOPY    . KNEE SURGERY Right   . MANDIBLE SURGERY     right side in front of ear  . POLYPECTOMY    . WISDOM TOOTH EXTRACTION       OB History    Gravida  6   Para  3   Term  3   Preterm      AB  3   Living  3     SAB      TAB  3   Ectopic      Multiple      Live Births  3           Family History  Problem Relation Age of Onset  . Colon cancer Mother        dx'd in her 16's-- stage 2   . Heart disease Father   . Colon cancer Maternal Grandmother   . Colon polyps Sister   . Colon polyps Sister   . Alcohol abuse Other   . Rectal cancer Neg Hx   . Stomach cancer Neg Hx   . Esophageal cancer Neg Hx  Social History   Tobacco Use  . Smoking status: Former Smoker    Types: Cigarettes    Quit date: 06/15/1983    Years since quitting: 36.0  . Smokeless tobacco: Never Used  Substance Use Topics  . Alcohol use: Yes    Comment: occasional  . Drug use: No    Home Medications Prior to Admission medications   Medication Sig Start Date End Date Taking? Authorizing Provider  ALPRAZolam (XANAX) 0.25 MG tablet TAKE 1 TABLET BY MOUTH THREE TIMES A DAY Patient taking differently: Take 0.25 mg by mouth daily as needed for anxiety.  04/19/19  Yes Hoyt Koch, MD  b complex vitamins tablet Take 1 tablet by mouth daily.   Yes [provider]  Cholecalciferol (VITAMIN D3) 50 MCG (2000 UT) TABS Take 2,000 mg by mouth daily.   Yes [provider]  ibuprofen (ADVIL,MOTRIN) 200 MG tablet Take 200 mg by mouth 2 (two) times daily as needed for fever or headache.    Yes [provider]  loratadine (CLARITIN) 10 MG tablet Take 10 mg by mouth daily as needed for allergies.    Yes [provider]  MAGNESIUM GLUCONATE PO Take 400 mg by mouth 2 (two) times daily.   Yes [provider]  propranolol (INDERAL) 10 MG tablet Take 1 tablet (10 mg total) by mouth daily. As needed for palpitations. Please make overdue appt with Dr. Acie Fredrickson. 3rd and final attempt Patient taking differently: Take 10 mg by mouth daily as needed. As needed for palpitations. Please make overdue appt with Dr. Acie Fredrickson. 3rd and final attempt 04/13/18  Yes Nahser, Wonda Cheng, MD  zinc gluconate 50 MG tablet Take 50 mg by mouth daily.   Yes [provider]    Allergies    Atorvastatin, Crestor [rosuvastatin calcium], Latex, Lipitor [atorvastatin calcium], Other, and Pollen extract  Review of Systems   Review of Systems  Constitutional: Positive for chills, fatigue and fever.  HENT: Negative for ear pain and sore throat.   Eyes: Negative for pain and visual disturbance.  Respiratory: Positive for cough and shortness of breath.   Cardiovascular: Negative for chest pain and palpitations.  Gastrointestinal: Negative for abdominal pain and vomiting.  Genitourinary: Negative for dysuria and hematuria.  Musculoskeletal: Negative for arthralgias and back pain.  Skin: Negative for color change and rash.  Neurological: Negative for seizures and syncope.  All other systems reviewed and are negative.   Physical Exam Updated Vital Signs BP (!) 103/91   Pulse (!) 103   Temp 98.2 F (36.8 C) (Oral)   Resp (!) 22   Ht 5\' 4"  (1.626 m)   Wt 69.4 kg   SpO2 92%   BMI 26.26 kg/m   Physical Exam Vitals and nursing note reviewed.  Constitutional:      General: She is not in acute distress.    Appearance: She is well-developed.  HENT:     Head: Normocephalic and atraumatic.  Eyes:     Conjunctiva/sclera: Conjunctivae normal.  Cardiovascular:     Rate and Rhythm: Normal rate and regular rhythm.     Heart sounds: No murmur.  Pulmonary:     Comments: Mild tachypnea, no accessory muscle use,  no respiratory distress, mild rhonchi bilaterally, no wheeze Abdominal:     Palpations: Abdomen is soft.     Tenderness: There is no abdominal tenderness.  Musculoskeletal:        General: No swelling or tenderness.     Cervical back:  Neck supple.  Skin:    General: Skin is warm and dry.     Capillary Refill: Capillary refill takes less than 2 seconds.  Neurological:     General: No focal deficit present.     Mental Status: She is alert and oriented to person, place, and time.  Psychiatric:        Mood and Affect: Mood normal.        Behavior: Behavior normal.    ED Results / Procedures / Treatments   Labs (all labs ordered are listed, but only abnormal results are displayed) Labs Reviewed  COMPREHENSIVE METABOLIC PANEL - Abnormal; Notable for the following components:      Result Value   Glucose, Bld 110 (*)    Calcium 8.8 (*)    All other components within normal limits  D-DIMER, QUANTITATIVE (NOT AT [facility]) - Abnormal; Notable for the following components:   D-Dimer, Quant 0.62 (*)    All other components within normal limits  FIBRINOGEN - Abnormal; Notable for the following components:   Fibrinogen 539 (*)    All other components within normal limits  C-REACTIVE PROTEIN - Abnormal; Notable for the following components:   CRP 6.8 (*)    All other components within normal limits  CBC WITH DIFFERENTIAL/PLATELET  PROCALCITONIN  LACTATE DEHYDROGENASE  FERRITIN  TRIGLYCERIDES  TROPONIN I (HIGH SENSITIVITY)    EKG None  Radiology DG Chest Portable 1 View  Result Date: 07/05/2019 CLINICAL DATA:  Shortness of breath. COVID positive. EXAM: PORTABLE CHEST 1 VIEW COMPARISON:  03/04/2016 FINDINGS: The cardiac silhouette, mediastinal and hilar contours are within normal limits and stable. The lungs are clear of an acute process. No infiltrates or effusions. No worrisome pulmonary lesions. The bony thorax is intact. IMPRESSION: No acute cardiopulmonary findings. Electronically  Signed   By: Marijo Sanes M.D.   On: 07/05/2019 10:49    Procedures Procedures (including critical care time)  Medications Ordered in ED Medications  ketorolac (TORADOL) 15 MG/ML injection 15 mg (15 mg Intravenous Given 07/05/19 1019)    ED Course  I have reviewed the triage vital signs and the nursing notes.  Pertinent labs & imaging results that were available during my care of the patient were reviewed by me and considered in my medical decision making (see chart for details).  Clinical Course as of Jul 04 1200  Thu Jul 05, 2019  1159 Reassessed patient, remains well-appearing, reviewed results, reviewed return precautions in detail, will discharge   [RD]    Clinical Course User Index [RD] Lucrezia Starch, MD   MDM Rules/Calculators/A&P                      69 year old otherwise healthy lady presents to ER with worsening cough, shortness of breath in setting of known COVID-19.  Symptoms for around 9 days in total.  CXR is relatively clear.  Dimer is slightly above normal range, within normal range for age adjusted.  Believe most likely her symptoms are related to COVID-19, doubt ACS or pulmonary embolism.  Her EKG is normal.  There is documented borderline hypoxic pulse oximetry while patient was sleeping, upon waking, pulse ox came up to mid 90s, she is well-appearing without respiratory distress, speaking full sentences.  At this time I believe she is appropriate for outpatient management and discharge.  Recommend close outpatient follow-up, reviewed close return precautions, will discharge home.    After the discussed management above, the patient was determined to be safe  for discharge.  The patient was in agreement with this plan and all questions regarding their care were answered.  ED return precautions were discussed and the patient will return to the ED with any significant worsening of condition.  Jean Smith was evaluated in Emergency Department on 07/05/2019 for  the symptoms described in the history of present illness. She was evaluated in the context of the global COVID-19 pandemic, which necessitated consideration that the patient might be at risk for infection with the SARS-CoV-2 virus that causes COVID-19. Institutional protocols and algorithms that pertain to the evaluation of patients at risk for COVID-19 are in a state of rapid change based on information released by regulatory bodies including the CDC and federal and state organizations. These policies and algorithms were followed during the patient's care in the ED.   Final Clinical Impression(s) / ED Diagnoses Final diagnoses:  [id number]    Rx / DC Orders ED Discharge Orders    None       Lucrezia Starch, MD 07/05/19 1202

## 2019-07-05 NOTE — ED Triage Notes (Signed)
Arrives via EMS from home, tested positive for COVID on the 12th of January, trouble breathing since yesterday, endorses chest tightness, called her primary and they wanted her to get a chest X-ray to R/O PNA. Ambulatory and able to speak full sentences.  BP 130/80 P 106 O2 94 Ra T 98.4 temporal

## 2019-07-05 NOTE — Telephone Encounter (Signed)
Pt. Reports she was diagnosed with COVID 19 06/26/19. Yesterday developed shortness of breath and " can't take a deep breath.I feel like I need oxygen." Has "some burning in the middle of my chest." Low grade fever 99.6. Is currently home alone. Will call 911.  Reason for Disposition . Sounds like a life-threatening emergency to the triager  Answer Assessment - Initial Assessment Questions 1. RESPIRATORY STATUS: "Describe your breathing?" (e.g., wheezing, shortness of breath, unable to speak, severe coughing)      Shortness of breath 2. ONSET: "When did this breathing problem begin?"      Started yesterday 3. PATTERN "Does the difficult breathing come and go, or has it been constant since it started?"      Constant 4. SEVERITY: "How bad is your breathing?" (e.g., mild, moderate, severe)    - MILD: No SOB at rest, mild SOB with walking, speaks normally in sentences, can lay down, no retractions, pulse < 100.    - MODERATE: SOB at rest, SOB with minimal exertion and prefers to sit, cannot lie down flat, speaks in phrases, mild retractions, audible wheezing, pulse 100-120.    - SEVERE: Very SOB at rest, speaks in single words, struggling to breathe, sitting hunched forward, retractions, pulse > 120      Moderate 5. RECURRENT SYMPTOM: "Have you had difficulty breathing before?" If so, ask: "When was the last time?" and "What happened that time?"      No 6. CARDIAC HISTORY: "Do you have any history of heart disease?" (e.g., heart attack, angina, bypass surgery, angioplasty)      Yes - irregular heart beat 7. LUNG HISTORY: "Do you have any history of lung disease?"  (e.g., pulmonary embolus, asthma, emphysema)     No 8. CAUSE: "What do you think is causing the breathing problem?"      Pneumonia  9. OTHER SYMPTOMS: "Do you have any other symptoms? (e.g., dizziness, runny nose, cough, chest pain, fever)     Low grade fever 10. PREGNANCY: "Is there any chance you are pregnant?" "When was your last  menstrual period?"       No 11. TRAVEL: "Have you traveled out of the country in the last month?" (e.g., travel history, exposures)       No  Protocols used: BREATHING DIFFICULTY-A-AH

## 2019-07-05 NOTE — ED Notes (Signed)
Patient ambulated in room on RA. O2 saturation 93% with ambulation. Patient reporting dizziness.

## 2019-07-06 ENCOUNTER — Telehealth: Payer: Self-pay

## 2019-07-06 NOTE — Telephone Encounter (Signed)
Pt stated she is COVID positive and wanted to make sure that the documentation was in her chart from her visit at [facility]. I informed her that it is in her chart.

## 2019-07-06 NOTE — Telephone Encounter (Signed)
Called pt, LVM.    Copied from [place] [phone]. Topic: General - Inquiry >> Jul 04, 2019  4:35 PM Alease Frame wrote: Reason for CRM: Patient is wanting a call back from Dr Charlynne Cousins nurse . Please advise

## 2019-07-10 ENCOUNTER — Telehealth: Payer: Self-pay | Admitting: Cardiology

## 2019-07-10 DIAGNOSIS — U071 COVID-19: Secondary | ICD-10-CM

## 2019-07-10 DIAGNOSIS — R0789 Other chest pain: Secondary | ICD-10-CM

## 2019-07-10 DIAGNOSIS — R072 Precordial pain: Secondary | ICD-10-CM

## 2019-07-10 DIAGNOSIS — R0602 Shortness of breath: Secondary | ICD-10-CM

## 2019-07-10 NOTE — Telephone Encounter (Signed)
Spoke with the pt and informed her that Dr. Meda Coffee recommends that we get an echo on her in 2 weeks, around 2/8 and she will need follow-up that week, in person to see an APP.  Informed the pt that I will place the order for the echo in the system and have our schedulers call her back to arrange this in 2 weeks. Scheduled the pt to come in for follow-up on 07/25/19 at 1145, to see Daune Perch NP.  Advised the pt to arrive 15 mins prior to that appt, and wear her facial mask. Pt verbalized understanding and agrees with this plan.

## 2019-07-10 NOTE — Telephone Encounter (Signed)
Pt c/o BP issue: STAT if pt c/o blurred vision, one-sided weakness or slurred speech  1. What are your last 5 BP readings? Last night - 101/55, 102/48 HR 96, 112/55, This morning 93/52 HR 84 and 105/54 HR 109  2. Are you having any other symptoms (ex. Dizziness, headache, blurred vision, passed out)? "hard to say, I dont have a headache or a fever. I do have discomfort in my core. I do have chest pressure near my breast bone. It is not pain but it does feel like pressure. I'm not sure if its because of a cold"  3. What is your BP issue? HR is low. Patient tested positive on the 11th. She started feeling bad last Thursday. She went to [facility] last Thursday, they did a chest xray and blood work and they sent her home. Said she was just having typical covid symptoms.   Pt c/o of Chest Pain: STAT if CP now or developed within 24 hours  1. Are you having CP right now? Having chest pressure now. Not painful   2. Are you experiencing any other symptoms (ex. SOB, nausea, vomiting, sweating)? SOB, does notice she is SOB when she gets up out of her chair. Feels like she has to stretch to get a good breath.   3. How long have you been experiencing CP? Last Thursday.  4. Is your CP continuous or coming and going? continuous  5. Have you taken Nitroglycerin? no ?

## 2019-07-10 NOTE — Telephone Encounter (Signed)
Patient reports that she started feeling bad on 06/25/19 and tested positive for COVID on 06/26/19. She states that she had a fever, body ache, chills, cough, headache, diarrhea and shortness of breath. She states that she still has a cough, is short of breath and has developed some chest tightness. She reports that the tightness is constant. She went to the ER on 07/05/19 due to the chest tightness. Chest X-ray and lab work were done which came back normal. She was told that she was just having typical symptoms of COVID and was sent home. She has been taking her BP which has been running low and HR which has been running high. Currently BP 107/67, HR 94, temperature 97.54F and O2 of 96%. She states that her O2 has been good, but last week it would drop down to 92-93% but after taking a few deep breaths it would go back up. She was advised in the ER to come back if O2 dropped below 90%. Patient states that she has been trying to stay hydrated with coconut water and Gatorade. She states that she is feeling much better other than the shortness of breath and chest tightness.   Patient states that she is not taking propanolol. She took a baby aspirin this morning. She also states that she has not had any palpitations while she has been sick. Patient is concerned about her chest tightness and does not know if it is related to her heart or due to respiratory illness. I advised patient that I would let Dr. Meda Coffee know about her symptoms but that she should keep monitoring her vitals and chest pain and if anything worsened then she would need to go back to the ER. Patient verbalized understanding.

## 2019-07-10 NOTE — Telephone Encounter (Signed)
Please order an echocardiogram and follow-up visit with [place] in 2 weeks.

## 2019-07-11 ENCOUNTER — Other Ambulatory Visit (HOSPITAL_COMMUNITY): Payer: BC Managed Care – PPO

## 2019-07-11 NOTE — Telephone Encounter (Signed)
Pt is scheduled for her echo on 07/23/19 at 1135.  Pt made aware of appt date and time by [facility] scheduling.

## 2019-07-12 ENCOUNTER — Telehealth: Payer: Self-pay

## 2019-07-12 NOTE — Telephone Encounter (Signed)
Spoke with pt and she stated that she did not go to the ED, she stated that she is feeling a little better and has been "taking it easy". However, she did let me know that she followed up with her cardiologist in regard.

## 2019-07-12 NOTE — Telephone Encounter (Signed)
Patient called and spoke with [facility] on  07/09/2019 6:55:17 PM and states "she has COVID, is feeling light headed (new), coughing, chest tightness, diarrhea, BP 102/48, pulse 96. Normal BP 120s/70s. O2 sat 92 to 96%. Was seen in ED 1/21."   Patient was advised by [facility] to go to the ED at time of call. Upon looking in chart, no encounters visible that patient went to ED.

## 2019-07-18 ENCOUNTER — Other Ambulatory Visit: Payer: Self-pay

## 2019-07-20 ENCOUNTER — Ambulatory Visit: Payer: BC Managed Care – PPO | Admitting: Obstetrics & Gynecology

## 2019-07-20 ENCOUNTER — Other Ambulatory Visit: Payer: Self-pay

## 2019-07-20 ENCOUNTER — Encounter: Payer: Self-pay | Admitting: Obstetrics & Gynecology

## 2019-07-20 VITALS — BP 120/68 | HR 84 | Temp 97.2°F | Resp 10 | Ht 63.25 in | Wt 154.0 lb

## 2019-07-20 DIAGNOSIS — Z0000 Encounter for general adult medical examination without abnormal findings: Secondary | ICD-10-CM | POA: Diagnosis not present

## 2019-07-20 NOTE — Progress Notes (Signed)
69 y.o. [id number] Married White or Caucasian female here for annual exam.  Had some shoulder issues in late 2019 and 2020.  Had PT.  Reports her mother at [place] had Covid.  Pt got Covid from this as well.  Her mother passed four days after being diagnosed with Covid.  One of her sister's got covid and one did not.  Pt did go to the ER 07/05/19 due to SOB.  She lost her sense of taste and smell.  Most symptoms are resolved.  Denies vaginal bleeding.    No LMP recorded. Patient is postmenopausal.          Sexually active: No.  The current method of family planning is post menopausal status.    Exercising: No.  The patient does not participate in regular exercise at present. Smoker:  no  Health Maintenance: Pap:  03/09/18 Neg:Neg HR HPV History of abnormal Pap:  no MMG:  02/01/17 BIRADS1:neg.  Discussed with pt. Colonoscopy:  04/30/19 f/u 3 years.  Dr. Silverio Decamp.   BMD:   02/26/15 Osteopenia  TDaP:  2016 Pneumonia vaccine(s):  Completed Shingrix:   no Hep C testing: 08/27/14 neg  Screening Labs: PCP   reports that she quit smoking about 36 years ago. Her smoking use included cigarettes. She has never used smokeless tobacco. She reports current alcohol use. She reports that she does not use drugs.  Past Medical History:  Diagnosis Date  . Allergy   . Anxiety   . Arthritis   . Cataract    bilateral - MD is just watching   . Diverticulosis   . Fuchs' endothelial dystrophy    follows with optho regularly   . Gestational diabetes   . Heart murmur    MVP   . History of depression   . HSV-2 infection   . Hyperlipidemia    no meds - diet controlled  . Hyperplastic colon polyp   . Mitral valve prolapse   . PVC's (premature ventricular contractions)    intol of BB, sxc palpitations r/t stress  . RVOT ventricular tachycardia/PVCs    EP eval 01/2013 for freq PVCs    Past Surgical History:  Procedure Laterality Date  . CESAREAN SECTION     x 3  . COLONOSCOPY    . KNEE SURGERY Right    . MANDIBLE SURGERY     right side in front of ear  . POLYPECTOMY    . WISDOM TOOTH EXTRACTION      Current Outpatient Medications  Medication Sig Dispense Refill  . ALPRAZolam (XANAX) 0.25 MG tablet TAKE 1 TABLET BY MOUTH THREE TIMES A DAY (Patient taking differently: Take 0.25 mg by mouth daily as needed for anxiety. ) 90 tablet 3  . b complex vitamins tablet Take 1 tablet by mouth daily.    . Cholecalciferol (VITAMIN D3) 50 MCG (2000 UT) TABS Take 2,000 mg by mouth daily.    Marland Kitchen ibuprofen (ADVIL,MOTRIN) 200 MG tablet Take 200 mg by mouth 2 (two) times daily as needed for fever or headache.     . loratadine (CLARITIN) 10 MG tablet Take 10 mg by mouth daily as needed for allergies.     Marland Kitchen MAGNESIUM GLUCONATE PO Take 400 mg by mouth 2 (two) times daily.    . propranolol (INDERAL) 10 MG tablet Take 1 tablet (10 mg total) by mouth daily. As needed for palpitations. Please make overdue appt with Dr. Acie Fredrickson. 3rd and final attempt (Patient taking differently: Take 10 mg by mouth daily  as needed. As needed for palpitations. Please make overdue appt with Dr. Acie Fredrickson. 3rd and final attempt) 15 tablet 0   No current facility-administered medications for this visit.    Family History  Problem Relation Age of Onset  . Colon cancer Mother        dx'd in her 24's-- stage 2   . Heart disease Father   . Colon cancer Maternal Grandmother   . Colon polyps Sister   . Colon polyps Sister   . Alcohol abuse Other   . Rectal cancer Neg Hx   . Stomach cancer Neg Hx   . Esophageal cancer Neg Hx     Review of Systems  All other systems reviewed and are negative.   Exam:   BP 120/68 (BP Location: Right Arm, Patient Position: Sitting, Cuff Size: Normal)   Pulse 84   Temp (!) 97.2 F (36.2 C) (Temporal)   Resp 10   Ht 5' 3.25" (1.607 m)   Wt 154 lb (69.9 kg)   BMI 27.06 kg/m    Height: 5' 3.25" (160.7 cm)  Ht Readings from Last 3 Encounters:  07/20/19 5' 3.25" (1.607 m)  07/05/19 5\' 4"  (1.626 m)   05/30/19 5\' 3"  (1.6 m)    General appearance: alert, cooperative and appears stated age Head: Normocephalic, without obvious abnormality, atraumatic Neck: no adenopathy, supple, symmetrical, trachea midline and thyroid normal to inspection and palpation Lungs: clear to auscultation bilaterally Breasts: normal appearance, no masses or tenderness Heart: regular rate and rhythm Abdomen: soft, non-tender; bowel sounds normal; no masses,  no organomegaly Extremities: extremities normal, atraumatic, no cyanosis or edema Skin: Skin color, texture, turgor normal. No rashes or lesions Lymph nodes: Cervical, supraclavicular, and axillary nodes normal. No abnormal inguinal nodes palpated Neurologic: Grossly normal  Pelvic: External genitalia:  no lesions              Urethra:  normal appearing urethra with no masses, tenderness or lesions              Bartholins and Skenes: normal                 Vagina: normal appearing vagina with normal color and discharge, no lesions              Cervix: no lesions              Pap taken: No. Bimanual Exam:  Uterus:  normal size, contour, position, consistency, mobility, non-tender              Adnexa: normal adnexa and no mass, fullness, tenderness               Rectovaginal: Confirms               Anus:  normal sphincter tone, no lesions  Chaperone, Terence Lux, CMA, was present for exam.  A:  Well Woman with normal exam PMP, no HRT H/o depression H/o HSV 2 H/o C section x 3 Elevated lipids  P:   Mammogram guidelines reviewed.  She knows this is overdue.  States she will call and schedule Colonoscopy is UTD Lab work done with hospitalization pap smear neg with neg HR HPV 2019.  Not indicated today. Plan BMD next year. Return annually or prn

## 2019-07-22 ENCOUNTER — Ambulatory Visit: Payer: BC Managed Care – PPO

## 2019-07-23 ENCOUNTER — Ambulatory Visit (HOSPITAL_COMMUNITY): Payer: BC Managed Care – PPO | Attending: Cardiovascular Disease

## 2019-07-23 ENCOUNTER — Other Ambulatory Visit: Payer: Self-pay

## 2019-07-23 DIAGNOSIS — R0602 Shortness of breath: Secondary | ICD-10-CM | POA: Diagnosis not present

## 2019-07-23 DIAGNOSIS — R0789 Other chest pain: Secondary | ICD-10-CM | POA: Insufficient documentation

## 2019-07-23 DIAGNOSIS — U071 COVID-19: Secondary | ICD-10-CM | POA: Insufficient documentation

## 2019-07-23 DIAGNOSIS — R072 Precordial pain: Secondary | ICD-10-CM | POA: Insufficient documentation

## 2019-07-25 ENCOUNTER — Ambulatory Visit: Payer: BC Managed Care – PPO | Admitting: Cardiology

## 2019-08-04 ENCOUNTER — Ambulatory Visit: Payer: BC Managed Care – PPO | Attending: Internal Medicine

## 2019-08-04 DIAGNOSIS — Z23 Encounter for immunization: Secondary | ICD-10-CM

## 2019-08-04 NOTE — Progress Notes (Signed)
   Covid-19 Vaccination Clinic  Name:  Jean Smith    MRN: [id number] DOB: Jan 26, 1951  08/04/2019  [name] was observed post Covid-19 immunization for 15 minutes without incidence. She was provided with Vaccine Information Sheet and instruction to access the V-Safe system.   [name] was instructed to call 911 with any severe reactions post vaccine: Marland Kitchen Difficulty breathing  . Swelling of your face and throat  . A fast heartbeat  . A bad rash all over your body  . Dizziness and weakness    Immunizations Administered    Name Date Dose VIS Date Route   Pfizer COVID-19 Vaccine 08/04/2019  1:19 PM 0.3 mL 05/25/2019 Intramuscular   Manufacturer: [place]   Lot: [id number]   [place]: [id number]

## 2019-08-08 ENCOUNTER — Other Ambulatory Visit: Payer: BC Managed Care – PPO

## 2019-08-13 ENCOUNTER — Ambulatory Visit (HOSPITAL_COMMUNITY): Payer: BC Managed Care – PPO

## 2019-08-28 ENCOUNTER — Ambulatory Visit: Payer: BC Managed Care – PPO

## 2019-09-08 ENCOUNTER — Ambulatory Visit: Payer: BC Managed Care – PPO

## 2019-09-10 ENCOUNTER — Other Ambulatory Visit: Payer: BC Managed Care – PPO

## 2019-09-10 ENCOUNTER — Telehealth: Payer: Self-pay

## 2019-09-10 NOTE — Telephone Encounter (Signed)
[facility] called the pt and got her Coronary CT rescheduled.  Below are the dates for the CT and lab prior too.  CARDIAC CT Received: Today Message Contents  Cam Hai, LPN; Lorenza Evangelist, RN  Patient scheduled for April 13,2021 1p she is scheduled for her lab for march 31,2021

## 2019-09-10 NOTE — Telephone Encounter (Signed)
New message   Was calling pt to schedule cardiac ct, she said she was having this issue:    Pt c/o of Chest Pain: STAT if CP now or developed within 24 hours  1. Are you having CP right now? Not right now   2. Are you experiencing any other symptoms (ex. SOB, nausea, vomiting, sweating)?  No , but pain in left shoulder  At the time of the chest pain  3. How long have you been experiencing CP? Started this morning   4. Is your CP continuous or coming and going? Happened when she first woke up , she got a funny feeling in her chest and pain in left shoulder, she took her bp it was 164/60 , she chewed up 2 aspirin and has been sitting down, seems to be easing. Took bp now and it 124/60  5. Have you taken Nitroglycerin? No - she took two aspirin  ?

## 2019-09-10 NOTE — Telephone Encounter (Signed)
Pt is calling to make an appt with Dr. Meda Coffee or an Extender this week, for one episode of left shoulder, and left sided chest discomfort.  Pt states this morning she woke up with the top of her left shoulder hurting, and this radiated to her left breast.  Pt states she then got up and took 2 baby ASA, and the complaint stopped.  Pt states she had no sob, N/V, diaphoresis, dizziness, lightheadedness, DOE, pre-syncopal, or syncopal episodes when her episode occurred.  Pt states her BP right now is 124/60.  Pt states she is asymptomatic at this time. Pt is still needing to get her Coronary CT scheduled that she has cancelled multiple times, due to several family members being sick or had passed away.  Scheduled the pt to come in and see Vin Bhagat PA-C for this Wednesday 09/12/19 at 2:15 pm.  Pt is aware to arrive 15 mins prior to this appt.  Informed the pt that Dr. Meda Coffee is out of the office this week. Advised the pt that if her symptoms reoccur again between now and her appt with [place] on 3/31, then she should seek immediate medical attention or call 911.  Pt education provided on what alarming symptoms warrant immediate medical attention.  Also informed the pt that we need to get her Coronary CT rescheduled, for this will be able to definitively tell us if she is having any indication of a blockage in her Coronary arteries.  Informed the pt that I will have Freddie Apley our [facility] scheduler call her right back and go ahead and get this test on the books.  Pt verbalized understanding and agrees with this plan.

## 2019-09-11 NOTE — Progress Notes (Signed)
Cardiology Office Note    Date:  09/12/2019   ID:  Jean Smith, DOB May 22, 1951, MRN [id number]  PCP:  Hoyt Koch, MD  Cardiologist:  Dr. Meda Coffee   Chief Complaint: CP  History of Present Illness:   Jean Smith is a 69 y.o. female with hx of NSVT, valvular heart disease ( mild MR and mild AI), HLD, family hx of CAD and PVCs presents for chest pain.   She was previously followed by Dr. Acie Fredrickson.  She established with Dr. Meda Coffee in 09/2018.  She had an event monitor obtained secondary to palpitations.  This demonstrated 25 episodes of nonsustained ventricular tachycardia.  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  She was last seen by Dr. Meda Coffee in July 2020.  She recommended proceeding with coronary CTA to evaluate for ischemia.  However, this test has not yet been done. Did not tolerated BB.   Patient had COVID 06/2019. Got Pfizer vaccine 08/04/19. Echo 07/23/19: LVEF of 55-60%, grade 1 DD, mild MR, mild AI  Added to my schedule for chest pain.  She thinks she had sharp chest pain which she contributes to "Radish". Felt like indigestion. Chest discomfort resolved after discontinuation of radish.  She has nausea since then.  No palpitation, nausea, shortness of breath, diaphoresis, orthopnea, PND or syncope. Dealing with family stress.   She has previously canceled coronary CT secondary to mother passed away from [place] and her son had traumatic brain injury requiring craniotomy x2.   Past Medical History:  Diagnosis Date  . Allergy   . Anxiety   . Arthritis   . Cataract    bilateral - MD is just watching   . Diverticulosis   . Fuchs' endothelial dystrophy    follows with optho regularly   . Gestational diabetes   . Heart murmur    MVP   . History of depression   . HSV-2 infection   . Hyperlipidemia    no meds - diet controlled  . Hyperplastic colon polyp   . Mitral valve prolapse   . PVC's (premature ventricular contractions)    intol of BB, sxc palpitations  r/t stress  . RVOT ventricular tachycardia/PVCs    EP eval 01/2013 for freq PVCs    Past Surgical History:  Procedure Laterality Date  . CESAREAN SECTION     x 3  . COLONOSCOPY    . KNEE SURGERY Right   . MANDIBLE SURGERY     right side in front of ear  . POLYPECTOMY    . WISDOM TOOTH EXTRACTION      Current Medications: Prior to Admission medications   Medication Sig Start Date End Date Taking? Authorizing Provider  ALPRAZolam (XANAX) 0.25 MG tablet TAKE 1 TABLET BY MOUTH THREE TIMES A DAY Patient taking differently: Take 0.25 mg by mouth daily as needed for anxiety.  04/19/19   Hoyt Koch, MD  b complex vitamins tablet Take 1 tablet by mouth daily.    [provider]  Cholecalciferol (VITAMIN D3) 50 MCG (2000 UT) TABS Take 2,000 mg by mouth daily.    [provider]  ibuprofen (ADVIL,MOTRIN) 200 MG tablet Take 200 mg by mouth 2 (two) times daily as needed for fever or headache.     [provider]  loratadine (CLARITIN) 10 MG tablet Take 10 mg by mouth daily as needed for allergies.     [provider]  MAGNESIUM GLUCONATE PO Take 400 mg by mouth 2 (two) times daily.  [provider]  propranolol (INDERAL) 10 MG tablet Take 1 tablet (10 mg total) by mouth daily. As needed for palpitations. Please make overdue appt with Dr. Acie Fredrickson. 3rd and final attempt Patient taking differently: Take 10 mg by mouth daily as needed. As needed for palpitations. Please make overdue appt with Dr. Acie Fredrickson. 3rd and final attempt 04/13/18   Nahser, Wonda Cheng, MD    Allergies:   Atorvastatin, Crestor [rosuvastatin calcium], Latex, Lipitor [atorvastatin calcium], Other, and Pollen extract   Social History   Socioeconomic History  . Marital status: Married    Spouse name: Not on file  . Number of children: 3  . Years of education: Not on file  . Highest education level: Not on file  Occupational History  . Not on file  Tobacco Use  . Smoking  status: Former Smoker    Types: Cigarettes    Quit date: 06/15/1983    Years since quitting: 36.2  . Smokeless tobacco: Never Used  Substance and Sexual Activity  . Alcohol use: Yes    Comment: occasional  . Drug use: No  . Sexual activity: Not Currently    Birth control/protection: Post-menopausal  Other Topics Concern  . Not on file  Social History Narrative   Regular exercise: yes   Caffeine use: none   Social Determinants of Health   Financial Resource Strain:   . Difficulty of Paying Living Expenses:   Food Insecurity:   . Worried About Charity fundraiser in the Last Year:   . Arboriculturist in the Last Year:   Transportation Needs:   . Film/video editor (Medical):   Marland Kitchen Lack of Transportation (Non-Medical):   Physical Activity:   . Days of Exercise per Week:   . Minutes of Exercise per Session:   Stress:   . Feeling of Stress :   Social Connections:   . Frequency of Communication with Friends and Family:   . Frequency of Social Gatherings with Friends and Family:   . Attends Religious Services:   . Active Member of Clubs or Organizations:   . Attends Archivist Meetings:   Marland Kitchen Marital Status:      Family History:  The patient's family history includes Alcohol abuse in an other family member; Colon cancer in her maternal grandmother and mother; Colon polyps in her sister and sister; Heart disease in her father.   ROS:   Please see the history of present illness.    ROS All other systems reviewed and are negative.   PHYSICAL EXAM:   VS:  BP 120/60   Pulse 93   Ht 5' 3.5" (1.613 m)   Wt 156 lb (70.8 kg)   SpO2 99%   BMI 27.20 kg/m    GEN: Well nourished, well developed, in no acute distress  HEENT: normal  Neck: no JVD, carotid bruits, or masses Cardiac: RRR; no murmurs, rubs, or gallops,no edema  Respiratory:  clear to auscultation bilaterally, normal work of breathing GI: soft, nontender, nondistended, + BS MS: no deformity or atrophy   Skin: warm and dry, no rash Neuro:  Alert and Oriented x 3, Strength and sensation are intact Psych: euthymic mood, full affect  Wt Readings from Last 3 Encounters:  09/12/19 156 lb (70.8 kg)  07/20/19 154 lb (69.9 kg)  07/05/19 153 lb (69.4 kg)      Studies/Labs Reviewed:   EKG:  EKG is ordered today.  The ekg ordered today demonstrates SR with PVCs, repolarization  abnormality   Recent Labs: 12/25/2018: TSH 1.770 07/05/2019: ALT 16; BUN 9; Creatinine, Ser 0.67; Hemoglobin 13.7; Platelets 200; Potassium 3.8; Sodium 135   Lipid Panel    Component Value Date/Time   CHOL 278 (H) 12/25/2018 1204   TRIG 73 07/05/2019 1002   HDL 68 12/25/2018 1204   CHOLHDL 4.1 12/25/2018 1204   CHOLHDL 4 01/02/2018 1459   VLDL 49.2 (H) 01/02/2018 1459   LDLCALC 183 (H) 12/25/2018 1204   LDLDIRECT 168.0 01/02/2018 1459    Additional studies/ records that were reviewed today include:   Cardiac Catheterization: 07/23/19 1. Left ventricular ejection fraction, by estimation, is 55 to 60%. The  left ventricle has normal function. There is moderately increased left  ventricular hypertrophy. Left ventricular diastolic parameters are  consistent with Grade I diastolic  dysfunction (impaired relaxation).  2. Right ventricular systolic function is normal. There is normal  pulmonary artery systolic pressure.  3. Mild mitral valve regurgitation.  4. Aortic valve regurgitation is mild.     ASSESSMENT & PLAN:    1. Chest discomfort Likely due to issue.  Currently resolved.  Intermittent nausea.  EKG without acute abnormality.  See below.  2.  Nonsustained VT/PVC -I have at length discussion regarding coronary CT in 2 weeks.  Previously she has canceled secondary to family issue.  Discussed risks of sudden cardiac death or arrhythmia of untreated CAD. Advised to continue ASA 81mg  qd. Hx of statin intolerance.   3. Valvular heart disease - Continue to follow     Medication Adjustments/Labs  and Tests Ordered: Current medicines are reviewed at length with the patient today.  Concerns regarding medicines are outlined above.  Medication changes, Labs and Tests ordered today are listed in the Patient Instructions below. Patient Instructions  Medication Instructions:  Your physician recommends that you continue on your current medications as directed. Please refer to the Current Medication list given to you today.  *If you need a refill on your cardiac medications before your next appointment, please call your pharmacy*   Lab Work: Today: BMET  If you have labs (blood work) drawn today and your tests are completely normal, you will receive your results only by: Marland Kitchen MyChart Message (if you have MyChart) OR . A paper copy in the mail If you have any lab test that is abnormal or we need to change your treatment, we will call you to review the results.   Testing/Procedures: None   Follow-Up: At [facility] you and your health needs are our priority.  As part of our continuing mission to provide you with exceptional heart care, we have created designated Provider Care Teams.  These Care Teams include your primary Cardiologist (physician) and Advanced Practice Providers (APPs -  Physician Assistants and Nurse Practitioners) who all work together to provide you with the care you need, when you need it.  We recommend signing up for the patient portal called "MyChart".  Sign up information is provided on this After Visit Summary.  MyChart is used to connect with patients for Virtual Visits (Telemedicine).  Patients are able to view lab/test results, encounter notes, upcoming appointments, etc.  Non-urgent messages can be sent to your provider as well.   To learn more about what you can do with MyChart, go to [email or web address].    Your next appointment:   3-4 month(s)  The format for your next appointment:   In Person  Provider:   You may see Ena Dawley, MD or one of  the  following Advanced Practice Providers on your designated Care Team:    Melina Copa, PA-C  Ermalinda Barrios, PA-C        SignedCrista Luria [place], [place]  09/12/2019 4:03 PM    [facility] [address], [place], [place]  [address] Phone: [phone]; Fax: [phone]

## 2019-09-12 ENCOUNTER — Ambulatory Visit (INDEPENDENT_AMBULATORY_CARE_PROVIDER_SITE_OTHER): Payer: BC Managed Care – PPO | Admitting: Physician Assistant

## 2019-09-12 ENCOUNTER — Encounter: Payer: Self-pay | Admitting: *Deleted

## 2019-09-12 ENCOUNTER — Other Ambulatory Visit: Payer: Self-pay

## 2019-09-12 ENCOUNTER — Encounter: Payer: Self-pay | Admitting: Physician Assistant

## 2019-09-12 VITALS — BP 120/60 | HR 93 | Ht 63.5 in | Wt 156.0 lb

## 2019-09-12 DIAGNOSIS — R0789 Other chest pain: Secondary | ICD-10-CM

## 2019-09-12 DIAGNOSIS — R072 Precordial pain: Secondary | ICD-10-CM

## 2019-09-12 DIAGNOSIS — R0602 Shortness of breath: Secondary | ICD-10-CM | POA: Diagnosis not present

## 2019-09-12 DIAGNOSIS — I4729 Other ventricular tachycardia: Secondary | ICD-10-CM

## 2019-09-12 DIAGNOSIS — I341 Nonrheumatic mitral (valve) prolapse: Secondary | ICD-10-CM

## 2019-09-12 DIAGNOSIS — I472 Ventricular tachycardia: Secondary | ICD-10-CM | POA: Diagnosis not present

## 2019-09-12 NOTE — Patient Instructions (Signed)
Medication Instructions:  Your physician recommends that you continue on your current medications as directed. Please refer to the Current Medication list given to you today.  *If you need a refill on your cardiac medications before your next appointment, please call your pharmacy*   Lab Work: Today: BMET  If you have labs (blood work) drawn today and your tests are completely normal, you will receive your results only by: Marland Kitchen MyChart Message (if you have MyChart) OR . A paper copy in the mail If you have any lab test that is abnormal or we need to change your treatment, we will call you to review the results.   Testing/Procedures: None   Follow-Up: At [facility], you and your health needs are our priority.  As part of our continuing mission to provide you with exceptional heart care, we have created designated Provider Care Teams.  These Care Teams include your primary Cardiologist (physician) and Advanced Practice Providers (APPs -  Physician Assistants and Nurse Practitioners) who all work together to provide you with the care you need, when you need it.  We recommend signing up for the patient portal called "MyChart".  Sign up information is provided on this After Visit Summary.  MyChart is used to connect with patients for Virtual Visits (Telemedicine).  Patients are able to view lab/test results, encounter notes, upcoming appointments, etc.  Non-urgent messages can be sent to your provider as well.   To learn more about what you can do with MyChart, go to [email or web address].    Your next appointment:   3-4 month(s)  The format for your next appointment:   In Person  Provider:   You may see Ena Dawley, MD or one of the following Advanced Practice Providers on your designated Care Team:    Melina Copa, PA-C  Ermalinda Barrios, PA-C

## 2019-09-13 LAB — BASIC METABOLIC PANEL
BUN/Creatinine Ratio: 20 (ref 12–28)
BUN: 17 mg/dL (ref 8–27)
CO2: 28 mmol/L (ref 20–29)
Calcium: 9.3 mg/dL (ref 8.7–10.3)
Chloride: 103 mmol/L (ref 96–106)
Creatinine, Ser: 0.83 mg/dL (ref 0.57–1.00)
GFR calc Af Amer: 85 mL/min/{1.73_m2} (ref >59)
GFR calc non Af Amer: 74 mL/min/{1.73_m2} (ref >59)
Glucose: 118 mg/dL — ABNORMAL HIGH (ref 65–99)
Potassium: 4.4 mmol/L (ref 3.5–5.2)
Sodium: 144 mmol/L (ref 134–144)

## 2019-09-21 ENCOUNTER — Ambulatory Visit: Payer: PPO | Attending: Internal Medicine

## 2019-09-21 DIAGNOSIS — Z23 Encounter for immunization: Secondary | ICD-10-CM

## 2019-09-21 NOTE — Progress Notes (Signed)
   Covid-19 Vaccination Clinic  Name:  Jean Smith    MRN: [id number] DOB: [birth year]  09/21/2019  Jean Smith was observed post Covid-19 immunization for 15 minutes without incident. She was provided with Vaccine Information Sheet and instruction to access the V-Safe system.   Jean Smith was instructed to call 911 with any severe reactions post vaccine: Marland Kitchen Difficulty breathing  . Swelling of face and throat  . A fast heartbeat  . A bad rash all over body  . Dizziness and weakness   Immunizations Administered    Name Date Dose VIS Date Route   Pfizer COVID-19 Vaccine 09/21/2019  2:29 PM 0.3 mL 05/25/2019 Intramuscular   Manufacturer: [place]   Lot: [id number]   [place]: [id number]

## 2019-09-24 ENCOUNTER — Telehealth: Payer: Self-pay | Admitting: *Deleted

## 2019-09-24 ENCOUNTER — Encounter (HOSPITAL_COMMUNITY): Payer: Self-pay

## 2019-09-24 ENCOUNTER — Telehealth (HOSPITAL_COMMUNITY): Payer: Self-pay | Admitting: Emergency Medicine

## 2019-09-24 MED ORDER — METOPROLOL TARTRATE 100 MG PO TABS: 100.0000 mg | ORAL_TABLET | Freq: Once | ORAL | 0 refills | Status: DC

## 2019-09-24 NOTE — Telephone Encounter (Signed)
Attempted to call patient regarding upcoming cardiac CT appointment. °Left message on voicemail with name and callback number °Semaja Lymon RN Navigator Cardiac Imaging °[facility] °[phone] Office °[phone] Cell ° °

## 2019-09-24 NOTE — Telephone Encounter (Signed)
Jean Smith, I sent in the Metoprolol tartrate 100 mg PO x 1 to her pharmacy on file. She has cancelled multiple times and has been rescheduled.  She may already have some on hand. Thanks for letting us know.        CARDIAC CT Received: Yesterday Message Contents  Dorothy Spark, MD  Lorenza Evangelist, RN; Cam Hai, LPN  Yes, I would give her 100 mg PO x 1       Previous Messages   ----- Message -----  From: Lorenza Evangelist, RN  Sent: 09/22/2019  9:44 AM EDT  To: Dorothy Spark, MD, Nuala Alpha, LPN, *  Subject: RE: [place] ,   Last office visit HR in 90s. Can she have a one time dose beta blocker sent to her pharm?   Alford Highland  ----- Message -----  From: Howie Ill  Sent: 09/10/2019 10:22 AM EDT  To: Nuala Alpha, LPN, Lorenza Evangelist, RN  Subject: CARDIAC CT                    Patient scheduled for April 13,2021 1p she is scheduled for her lab for march 31,2021

## 2019-09-25 ENCOUNTER — Ambulatory Visit (HOSPITAL_COMMUNITY): Admission: RE | Admit: 2019-09-25 | Payer: PPO | Source: Ambulatory Visit

## 2019-09-25 ENCOUNTER — Telehealth (HOSPITAL_COMMUNITY): Payer: Self-pay | Admitting: Emergency Medicine

## 2019-09-25 NOTE — Telephone Encounter (Signed)
Pt calling to inform that she will not be coming in for CCTA today. Wishes to reschedule.   Marchia Bond RN Navigator Cardiac Imaging [facility] [phone] Office  [phone] Cell

## 2019-10-03 ENCOUNTER — Ambulatory Visit: Payer: BC Managed Care – PPO

## 2019-10-24 ENCOUNTER — Telehealth: Payer: Self-pay | Admitting: *Deleted

## 2019-10-24 NOTE — Telephone Encounter (Signed)
10 day letter Received: Today Message Contents  Cam Hai, LPN  Karlene Einstein   We have tried contacting this patient several times regarding scheduling, we get her schedule and she cancels then we cant get her ahold of her to reschedule, I am mailing 10 day letter , if no response by 11/06/19 we will cancel the order.   Thank you  Jannet Askew    Will send this to Dr. Meda Coffee as a general FYI.

## 2019-10-31 DIAGNOSIS — Z85828 Personal history of other malignant neoplasm of skin: Secondary | ICD-10-CM | POA: Diagnosis not present

## 2019-10-31 DIAGNOSIS — C44319 Basal cell carcinoma of skin of other parts of face: Secondary | ICD-10-CM | POA: Diagnosis not present

## 2019-11-06 ENCOUNTER — Telehealth: Payer: Self-pay | Admitting: *Deleted

## 2019-11-06 NOTE — Telephone Encounter (Signed)
cancel order Received: Today Message Contents  Cam Hai, LPN  Good morning we have tried reaching this patient several times with no response. We are cancelling the cardiac ct order at this time.    Thank you  Kathaleen Bury [id number] precert pending notes in referral  Benancio Deeds 07/04/2019 7:39 AM AUTH # [id number] EXP 12/30/19  Howie Ill 08/07/2019 9:33 AM 08/07/19 932a spoke with pt , it will be about 6 weeks before she will be able to reschedule , her son was in a bad accident. NJM  Howie Ill 09/06/2019 11:54 AM 09/06/19 1154a lmom to call back to schedule cardiac ct NJM  Howie Ill 09/10/2019 9:13 AM 09/10/19 912a called pt for cardiac ct , she said she was having chest pain, sent note to nurse. njm  Howie Ill 09/26/2019 2:32 PM Spoke with pt today, she wants to wait a few weeks before deciding if she is going to have test done, wants me to call back Mid May .  Howie Ill 10/24/2019 2:12 PM 10/24/19 212p lmom to call back mailing 10 day letter if no response by 11/06/19 delete order  Scheduling

## 2020-02-20 ENCOUNTER — Other Ambulatory Visit: Payer: Self-pay | Admitting: Cardiology

## 2020-02-20 DIAGNOSIS — I4729 Other ventricular tachycardia: Secondary | ICD-10-CM

## 2020-02-20 DIAGNOSIS — E785 Hyperlipidemia, unspecified: Secondary | ICD-10-CM

## 2020-02-20 DIAGNOSIS — E782 Mixed hyperlipidemia: Secondary | ICD-10-CM

## 2020-02-20 DIAGNOSIS — Z8249 Family history of ischemic heart disease and other diseases of the circulatory system: Secondary | ICD-10-CM

## 2020-02-20 DIAGNOSIS — R002 Palpitations: Secondary | ICD-10-CM

## 2020-02-20 DIAGNOSIS — I472 Ventricular tachycardia: Secondary | ICD-10-CM

## 2020-02-20 DIAGNOSIS — I341 Nonrheumatic mitral (valve) prolapse: Secondary | ICD-10-CM

## 2020-03-22 ENCOUNTER — Encounter: Payer: Self-pay | Admitting: Obstetrics & Gynecology

## 2020-03-22 DIAGNOSIS — Z1231 Encounter for screening mammogram for malignant neoplasm of breast: Secondary | ICD-10-CM | POA: Diagnosis not present

## 2020-03-24 ENCOUNTER — Ambulatory Visit: Payer: PPO

## 2020-03-29 ENCOUNTER — Ambulatory Visit: Payer: PPO

## 2020-04-12 ENCOUNTER — Ambulatory Visit: Payer: PPO

## 2020-05-12 DIAGNOSIS — D2261 Melanocytic nevi of right upper limb, including shoulder: Secondary | ICD-10-CM | POA: Diagnosis not present

## 2020-05-12 DIAGNOSIS — D2271 Melanocytic nevi of right lower limb, including hip: Secondary | ICD-10-CM | POA: Diagnosis not present

## 2020-05-12 DIAGNOSIS — D2262 Melanocytic nevi of left upper limb, including shoulder: Secondary | ICD-10-CM | POA: Diagnosis not present

## 2020-05-12 DIAGNOSIS — L821 Other seborrheic keratosis: Secondary | ICD-10-CM | POA: Diagnosis not present

## 2020-05-12 DIAGNOSIS — Z85828 Personal history of other malignant neoplasm of skin: Secondary | ICD-10-CM | POA: Diagnosis not present

## 2020-05-12 DIAGNOSIS — D225 Melanocytic nevi of trunk: Secondary | ICD-10-CM | POA: Diagnosis not present

## 2020-05-12 DIAGNOSIS — D224 Melanocytic nevi of scalp and neck: Secondary | ICD-10-CM | POA: Diagnosis not present

## 2020-05-12 DIAGNOSIS — L700 Acne vulgaris: Secondary | ICD-10-CM | POA: Diagnosis not present

## 2020-05-12 DIAGNOSIS — D2272 Melanocytic nevi of left lower limb, including hip: Secondary | ICD-10-CM | POA: Diagnosis not present

## 2020-05-20 ENCOUNTER — Ambulatory Visit (INDEPENDENT_AMBULATORY_CARE_PROVIDER_SITE_OTHER): Payer: PPO | Admitting: Internal Medicine

## 2020-05-20 ENCOUNTER — Other Ambulatory Visit: Payer: Self-pay

## 2020-05-20 ENCOUNTER — Encounter: Payer: Self-pay | Admitting: Internal Medicine

## 2020-05-20 VITALS — BP 146/90 | HR 81 | Temp 98.1°F | Ht 64.0 in | Wt 168.4 lb

## 2020-05-20 DIAGNOSIS — F411 Generalized anxiety disorder: Secondary | ICD-10-CM | POA: Diagnosis not present

## 2020-05-20 DIAGNOSIS — Z23 Encounter for immunization: Secondary | ICD-10-CM | POA: Diagnosis not present

## 2020-05-20 DIAGNOSIS — R002 Palpitations: Secondary | ICD-10-CM | POA: Diagnosis not present

## 2020-05-20 DIAGNOSIS — F32A Depression, unspecified: Secondary | ICD-10-CM | POA: Diagnosis not present

## 2020-05-20 DIAGNOSIS — Z0000 Encounter for general adult medical examination without abnormal findings: Secondary | ICD-10-CM | POA: Diagnosis not present

## 2020-05-20 DIAGNOSIS — E782 Mixed hyperlipidemia: Secondary | ICD-10-CM

## 2020-05-20 LAB — LIPID PANEL
Cholesterol: 293 mg/dL — ABNORMAL HIGH (ref 0–200)
HDL: 73.1 mg/dL (ref >39.00)
LDL Cholesterol: 188 mg/dL — ABNORMAL HIGH (ref 0–99)
NonHDL: 220.29
Total CHOL/HDL Ratio: 4
Triglycerides: 159 mg/dL — ABNORMAL HIGH (ref 0.0–149.0)
VLDL: 31.8 mg/dL (ref 0.0–40.0)

## 2020-05-20 LAB — HEMOGLOBIN A1C: Hgb A1c MFr Bld: 6.3 % (ref 4.6–6.5)

## 2020-05-20 LAB — CBC
HCT: 40.7 % (ref 36.0–46.0)
Hemoglobin: 13.6 g/dL (ref 12.0–15.0)
MCHC: 33.5 g/dL (ref 30.0–36.0)
MCV: 85.8 fl (ref 78.0–100.0)
Platelets: 259 10*3/uL (ref 150.0–400.0)
RBC: 4.75 Mil/uL (ref 3.87–5.11)
RDW: 13.6 % (ref 11.5–15.5)
WBC: 6.8 10*3/uL (ref 4.0–10.5)

## 2020-05-20 LAB — COMPREHENSIVE METABOLIC PANEL
ALT: 14 U/L (ref 0–35)
AST: 22 U/L (ref 0–37)
Albumin: 4.4 g/dL (ref 3.5–5.2)
Alkaline Phosphatase: 56 U/L (ref 39–117)
BUN: 19 mg/dL (ref 6–23)
CO2: 30 mEq/L (ref 19–32)
Calcium: 9.9 mg/dL (ref 8.4–10.5)
Chloride: 99 mEq/L (ref 96–112)
Creatinine, Ser: 0.74 mg/dL (ref 0.40–1.20)
GFR: 83.63 mL/min (ref >60.00)
Glucose, Bld: 97 mg/dL (ref 70–99)
Potassium: 3.6 mEq/L (ref 3.5–5.1)
Sodium: 136 mEq/L (ref 135–145)
Total Bilirubin: 0.4 mg/dL (ref 0.2–1.2)
Total Protein: 7.5 g/dL (ref 6.0–8.3)

## 2020-05-20 LAB — TSH: TSH: 1.3 u[IU]/mL (ref 0.35–4.50)

## 2020-05-20 MED ORDER — ALPRAZOLAM 0.25 MG PO TABS: 0.2500 mg | ORAL_TABLET | Freq: Every day | ORAL | 1 refills | Status: DC | PRN

## 2020-05-20 NOTE — Patient Instructions (Signed)
Health Maintenance, Female Adopting a healthy lifestyle and getting preventive care are important in promoting health and wellness. Ask your health care provider about:  The right schedule for you to have regular tests and exams.  Things you can do on your own to prevent diseases and keep yourself healthy. What should I know about diet, weight, and exercise? Eat a healthy diet   Eat a diet that includes plenty of vegetables, fruits, low-fat dairy products, and lean protein.  Do not eat a lot of foods that are high in solid fats, added sugars, or sodium. Maintain a healthy weight Body mass index (BMI) is used to identify weight problems. It estimates body fat based on height and weight. Your health care provider can help determine your BMI and help you achieve or maintain a healthy weight. Get regular exercise Get regular exercise. This is one of the most important things you can do for your health. Most adults should:  Exercise for at least 150 minutes each week. The exercise should increase your heart rate and make you sweat (moderate-intensity exercise).  Do strengthening exercises at least twice a week. This is in addition to the moderate-intensity exercise.  Spend less time sitting. Even light physical activity can be beneficial. Watch cholesterol and blood lipids Have your blood tested for lipids and cholesterol at 67 years of age, then have this test every 5 years. Have your cholesterol levels checked more often if:  Your lipid or cholesterol levels are high.  You are older than 67 years of age.  You are at high risk for heart disease. What should I know about cancer screening? Depending on your health history and family history, you may need to have cancer screening at various ages. This may include screening for:  Breast cancer.  Cervical cancer.  Colorectal cancer.  Skin cancer.  Lung cancer. What should I know about heart disease, diabetes, and high blood  pressure? Blood pressure and heart disease  High blood pressure causes heart disease and increases the risk of stroke. This is more likely to develop in people who have high blood pressure readings, are of African descent, or are overweight.  Have your blood pressure checked: ? Every 3-5 years if you are 18-39 years of age. ? Every year if you are 40 years old or older. Diabetes Have regular diabetes screenings. This checks your fasting blood sugar level. Have the screening done:  Once every three years after age 40 if you are at a normal weight and have a low risk for diabetes.  More often and at a younger age if you are overweight or have a high risk for diabetes. What should I know about preventing infection? Hepatitis B If you have a higher risk for hepatitis B, you should be screened for this virus. Talk with your health care provider to find out if you are at risk for hepatitis B infection. Hepatitis C Testing is recommended for:  Everyone born from 1945 through 1965.  Anyone with known risk factors for hepatitis C. Sexually transmitted infections (STIs)  Get screened for STIs, including gonorrhea and chlamydia, if: ? You are sexually active and are younger than 67 years of age. ? You are older than 67 years of age and your health care provider tells you that you are at risk for this type of infection. ? Your sexual activity has changed since you were last screened, and you are at increased risk for chlamydia or gonorrhea. Ask your health care provider if   you are at risk.  Ask your health care provider about whether you are at high risk for HIV. Your health care provider may recommend a prescription medicine to help prevent HIV infection. If you choose to take medicine to prevent HIV, you should first get tested for HIV. You should then be tested every 3 months for as long as you are taking the medicine. Pregnancy  If you are about to stop having your period (premenopausal) and  you may become pregnant, seek counseling before you get pregnant.  Take 400 to 800 micrograms (mcg) of folic acid every day if you become pregnant.  Ask for birth control (contraception) if you want to prevent pregnancy. Osteoporosis and menopause Osteoporosis is a disease in which the bones lose minerals and strength with aging. This can result in bone fractures. If you are 65 years old or older, or if you are at risk for osteoporosis and fractures, ask your health care provider if you should:  Be screened for bone loss.  Take a calcium or vitamin D supplement to lower your risk of fractures.  Be given hormone replacement therapy (HRT) to treat symptoms of menopause. Follow these instructions at home: Lifestyle  Do not use any products that contain nicotine or tobacco, such as cigarettes, e-cigarettes, and chewing tobacco. If you need help quitting, ask your health care provider.  Do not use street drugs.  Do not share needles.  Ask your health care provider for help if you need support or information about quitting drugs. Alcohol use  Do not drink alcohol if: ? Your health care provider tells you not to drink. ? You are pregnant, may be pregnant, or are planning to become pregnant.  If you drink alcohol: ? Limit how much you use to 0-1 drink a day. ? Limit intake if you are breastfeeding.  Be aware of how much alcohol is in your drink. In the [place] one drink equals one 12 oz bottle of beer (355 mL), one 5 oz glass of wine (148 mL), or one 1 oz glass of hard liquor (44 mL). General instructions  Schedule regular health, dental, and eye exams.  Stay current with your vaccines.  Tell your health care provider if: ? You often feel depressed. ? You have ever been abused or do not feel safe at home. Summary  Adopting a healthy lifestyle and getting preventive care are important in promoting health and wellness.  Follow your health care provider's instructions about healthy  diet, exercising, and getting tested or screened for diseases.  Follow your health care provider's instructions on monitoring your cholesterol and blood pressure. This information is not intended to replace advice given to you by your health care provider. Make sure you discuss any questions you have with your health care provider. Document Revised: 05/24/2018 Document Reviewed: 05/24/2018 Elsevier Patient Education  2020 Elsevier Inc.  

## 2020-05-20 NOTE — Progress Notes (Unsigned)
Subjective:   Patient ID: Jean Smith, female    DOB: 15-Sep-1950, 69 y.o.   MRN: [id number]  HPI Here for medicare wellness and physical, no new complaints. Please see A/P for status and treatment of chronic medical problems.   Diet: heart healthy Physical activity: sedentary Depression/mood screen: positive declines treatment Hearing: intact to whispered voice Visual acuity: grossly normal, overdue for annual eye exam  ADLs: capable Fall risk: none Home safety: good Cognitive evaluation: intact to orientation, naming, recall and repetition EOL planning: adv directives discussed  [place] Visit from 05/20/2020 in [place] at [facility]  PHQ-2 Total Score 4      [place] Visit from 05/20/2020 in [place] at [facility]  PHQ-9 Total Score 12      I have personally reviewed and have noted 1. The patient's medical and social history - reviewed today no changes 2. Their use of alcohol, tobacco or illicit drugs 3. Their current medications and supplements 4. The patient's functional ability including ADL's, fall risks, home safety risks and hearing or visual impairment. 5. Diet and physical activities 6. Evidence for depression or mood disorders 7. Care team reviewed and updated  Patient Care Team: Hoyt Koch, MD as PCP - General (Internal Medicine) Dorothy Spark, MD as PCP - Cardiology (Cardiology) Deboraha Sprang, MD (Cardiology) Nahser, Wonda Cheng, MD (Cardiology) Lafayette Dragon, MD (Inactive) (Gastroenterology) Aloha Gell, MD (Obstetrics and Gynecology) Monna Fam, MD (Ophthalmology) Past Medical History:  Diagnosis Date  . Allergy   . Anxiety   . Arthritis   . Cancer ([place])    skin  . Cataract    bilateral - MD is just watching   . Diverticulosis   . Fuchs' endothelial dystrophy    follows with optho regularly   . Gestational diabetes   . Heart murmur    MVP   . History of depression    . HSV-2 infection   . Hyperlipidemia    no meds - diet controlled  . Hyperplastic colon polyp   . Mitral valve prolapse   . PVC's (premature ventricular contractions)    intol of BB, sxc palpitations r/t stress  . RVOT ventricular tachycardia/PVCs    EP eval 01/2013 for freq PVCs   Past Surgical History:  Procedure Laterality Date  . CESAREAN SECTION     x 3  . COLONOSCOPY    . KNEE SURGERY Right   . MANDIBLE SURGERY     right side in front of ear  . MOHS SURGERY  2021  . POLYPECTOMY    . WISDOM TOOTH EXTRACTION     Family History  Problem Relation Age of Onset  . Colon cancer Mother        dx'd in her 56's-- stage 2   . Heart disease Father   . Colon cancer Maternal Grandmother   . Colon polyps Sister   . Colon polyps Sister   . Alcohol abuse Other   . Rectal cancer Neg Hx   . Stomach cancer Neg Hx   . Esophageal cancer Neg Hx    Review of Systems  Constitutional: Positive for activity change.  HENT: Negative.   Eyes: Negative.   Respiratory: Negative for cough, chest tightness and shortness of breath.   Cardiovascular: Positive for palpitations. Negative for chest pain and leg swelling.  Gastrointestinal: Negative for abdominal distention, abdominal pain, constipation, diarrhea, nausea and vomiting.  Musculoskeletal: Negative.   Skin: Negative.   Neurological:  Negative.   Psychiatric/Behavioral: Positive for decreased concentration, dysphoric mood and sleep disturbance. The patient is nervous/anxious.     Objective:  Physical Exam Constitutional:      Appearance: She is well-developed and well-nourished.  HENT:     Head: Normocephalic and atraumatic.  Eyes:     Extraocular Movements: EOM normal.  Cardiovascular:     Rate and Rhythm: Normal rate and regular rhythm.  Pulmonary:     Effort: Pulmonary effort is normal. No respiratory distress.     Breath sounds: Normal breath sounds. No wheezing or rales.  Abdominal:     General: Bowel sounds are normal.  There is no distension.     Palpations: Abdomen is soft.     Tenderness: There is no abdominal tenderness. There is no rebound.  Musculoskeletal:        General: No edema.     Cervical back: Normal range of motion.  Skin:    General: Skin is warm and dry.  Neurological:     Mental Status: She is alert and oriented to person, place, and time.     Coordination: Coordination normal.  Psychiatric:        Mood and Affect: Mood and affect normal.     Comments: Hard to redirect and talks more about the health of her family than her own health     Vitals:   05/20/20 1349  BP: (!) 146/90  Pulse: 81  Temp: 98.1 F (36.7 C)  TempSrc: Oral  SpO2: 97%  Weight: 168 lb 6.4 oz (76.4 kg)  Height: 5\' 4"  (1.626 m)    This visit occurred during the SARS-CoV-2 public health emergency.  Safety protocols were in place, including screening questions prior to the visit, additional usage of staff PPE, and extensive cleaning of exam room while observing appropriate contact time as indicated for disinfecting solutions.   Assessment & Plan:  Flu shot given at visit

## 2020-05-23 NOTE — Assessment & Plan Note (Signed)
Flu shot given. Covid-19 2 shots encouraged booster. Pneumonia declines today getting flu instead. Shingrix counseled due. Tetanus due 2026. Colonoscopy due 2023. Mammogram due 2022, pap smear aged out and dexa due declines today. Counseled about sun safety and mole surveillance. Counseled about the dangers of distracted driving. Given 10 year screening recommendations.

## 2020-05-23 NOTE — Assessment & Plan Note (Signed)
Checking lipid panel and adjust as needed.  

## 2020-05-23 NOTE — Assessment & Plan Note (Signed)
Declines additional treatment although she likely would benefit.

## 2020-05-23 NOTE — Assessment & Plan Note (Signed)
Previously had propranolol which she is not taking due to overdue follow up with cardiology. She is still having some palpitations and her untreated anxiety/depression likely contributes some.

## 2020-05-23 NOTE — Assessment & Plan Note (Signed)
Declines additional treatment. Counseling recommended. Using alprazolam prn rarely.

## 2020-05-28 ENCOUNTER — Other Ambulatory Visit: Payer: Self-pay

## 2020-05-28 ENCOUNTER — Ambulatory Visit: Payer: PPO | Admitting: Cardiology

## 2020-05-28 ENCOUNTER — Encounter: Payer: Self-pay | Admitting: Cardiology

## 2020-05-28 DIAGNOSIS — R072 Precordial pain: Secondary | ICD-10-CM | POA: Diagnosis not present

## 2020-05-28 MED ORDER — METOPROLOL TARTRATE 100 MG PO TABS: 100.0000 mg | ORAL_TABLET | Freq: Once | ORAL | 0 refills | Status: DC

## 2020-05-28 NOTE — Patient Instructions (Signed)
Medication Instructions:   Your physician recommends that you continue on your current medications as directed. Please refer to the Current Medication list given to you today.  *If you need a refill on your cardiac medications before your next appointment, please call your pharmacy*   Testing/Procedures:  Your cardiac CT will be scheduled at one of the below locations:   [facility] [address] [place], [place] [address] [phone]  If scheduled at [facility], please arrive at the [facility] main entrance of [facility] 30 minutes prior to test start time. Proceed to the [facility] Radiology Department (first floor) to check-in and test prep.  Please follow these instructions carefully (unless otherwise directed):  On the Night Before the Test: . Be sure to Drink plenty of water. . Do not consume any caffeinated/decaffeinated beverages or chocolate 12 hours prior to your test. . Do not take any antihistamines 12 hours prior to your test.   On the Day of the Test: . Drink plenty of water. Do not drink any water within one hour of the test. . Do not eat any food 4 hours prior to the test. . You may take your regular medications prior to the test.  . Take metoprolol 100 MG BY MOUTH  (Lopressor) two hours prior to test. . FEMALES- please wear underwire-free bra if available      After the Test: . Drink plenty of water. . After receiving IV contrast, you may experience a mild flushed feeling. This is normal. . On occasion, you may experience a mild rash up to 24 hours after the test. This is not dangerous. If this occurs, you can take Benadryl 25 mg and increase your fluid intake. . If you experience trouble breathing, this can be serious. If it is severe call 911 IMMEDIATELY. If it is mild, please call our office.  Once we have confirmed authorization from your insurance company, we will call you to set up a date and time for your test. Based  on how quickly your insurance processes prior authorizations requests, please allow up to 4 weeks to be contacted for scheduling your Cardiac CT appointment. Be advised that routine Cardiac CT appointments could be scheduled as many as 8 weeks after your provider has ordered it.  For non-scheduling related questions, please contact the cardiac imaging nurse navigator should you have any questions/concerns: Marchia Bond, Cardiac Imaging Nurse Navigator Burley Saver, Interim Cardiac Imaging Nurse [place] and Vascular Services Direct Office Dial: [phone]   For scheduling needs, including cancellations and rescheduling, please call [place], [phone].   Follow-Up: At [facility], you and your health needs are our priority.  As part of our continuing mission to provide you with exceptional heart care, we have created designated Provider Care Teams.  These Care Teams include your primary Cardiologist (physician) and Advanced Practice Providers (APPs -  Physician Assistants and Nurse Practitioners) who all work together to provide you with the care you need, when you need it.  We recommend signing up for the patient portal called "MyChart".  Sign up information is provided on this After Visit Summary.  MyChart is used to connect with patients for Virtual Visits (Telemedicine).  Patients are able to view lab/test results, encounter notes, upcoming appointments, etc.  Non-urgent messages can be sent to your provider as well.   To learn more about what you can do with MyChart, go to [email or web address].    Your next appointment:  6 month(s)  The format for your next appointment:   In Person  Provider:   Ena Dawley, MD

## 2020-05-28 NOTE — Progress Notes (Signed)
Cardiology Office Note    Date:  05/28/2020   ID:  Jean Smith, DOB July 13, 1950, MRN [id number]  PCP:  Hoyt Koch, MD  Cardiologist:  Dr. Meda Coffee   Chief Complaint: CP  History of Present Illness:   Jean Smith is a 69 y.o. female with hx of NSVT, valvular heart disease ( mild MR and mild AI), HLD, family hx of CAD and PVCs , an event monitor that demonstrated 25 episodes of nonsustained ventricular tachycardia.  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  She was last seen by Dr. Meda Coffee in July 2020.  She recommended proceeding with coronary CTA to evaluate for ischemia.  Patient agreed but then did not show up for her appointment as she was dealing with significant stressors in her her family including traumatic brain injury of her son who is now recovering.  She remains his primary caregiver.  She states that she stopped exercising and is eating ice cream every night and as a result she has gained 15 years just in these year. Patient had COVID 06/2019. Got Pfizer vaccine 08/04/19. Echo 07/23/19: LVEF of 55-60%, grade 1 DD, mild MR, mild AI  She has now episodes of chest pain that seems to be exertional as well as shortness of breath, pain seems atypical in her neck and back, she denies any palpitation dizziness or syncope.  She was previously scheduled for coronary CT but canceled. Her cholesterol has been significantly elevated in the past but got worse over this last year.  Past Medical History:  Diagnosis Date  . Allergy   . Anxiety   . Arthritis   . Cancer ([place])    skin  . Cataract    bilateral - MD is just watching   . Diverticulosis   . Fuchs' endothelial dystrophy    follows with optho regularly   . Gestational diabetes   . Heart murmur    MVP   . History of depression   . HSV-2 infection   . Hyperlipidemia    no meds - diet controlled  . Hyperplastic colon polyp   . Mitral valve prolapse   . PVC's (premature ventricular contractions)    intol of BB,  sxc palpitations r/t stress  . RVOT ventricular tachycardia/PVCs    EP eval 01/2013 for freq PVCs   Past Surgical History:  Procedure Laterality Date  . CESAREAN SECTION     x 3  . COLONOSCOPY    . KNEE SURGERY Right   . MANDIBLE SURGERY     right side in front of ear  . MOHS SURGERY  2021  . POLYPECTOMY    . WISDOM TOOTH EXTRACTION     Current Medications: Prior to Admission medications   Medication Sig Start Date End Date Taking? Authorizing Provider  ALPRAZolam (XANAX) 0.25 MG tablet TAKE 1 TABLET BY MOUTH THREE TIMES A DAY Patient taking differently: Take 0.25 mg by mouth daily as needed for anxiety.  04/19/19   Hoyt Koch, MD  b complex vitamins tablet Take 1 tablet by mouth daily.    [provider]  Cholecalciferol (VITAMIN D3) 50 MCG (2000 UT) TABS Take 2,000 mg by mouth daily.    [provider]  ibuprofen (ADVIL,MOTRIN) 200 MG tablet Take 200 mg by mouth 2 (two) times daily as needed for fever or headache.     [provider]  loratadine (CLARITIN) 10 MG tablet Take 10 mg by mouth daily as needed for allergies.  [provider]  MAGNESIUM GLUCONATE PO Take 400 mg by mouth 2 (two) times daily.    [provider]  propranolol (INDERAL) 10 MG tablet Take 1 tablet (10 mg total) by mouth daily. As needed for palpitations. Please make overdue appt with Dr. Acie Fredrickson. 3rd and final attempt Patient taking differently: Take 10 mg by mouth daily as needed. As needed for palpitations. Please make overdue appt with Dr. Acie Fredrickson. 3rd and final attempt 04/13/18   Nahser, Wonda Cheng, MD   Allergies:   Atorvastatin, Crestor [rosuvastatin calcium], Latex, Lipitor [atorvastatin calcium], Other, and Pollen extract   Social History   Socioeconomic History  . Marital status: Married    Spouse name: Not on file  . Number of children: 3  . Years of education: Not on file  . Highest education level: Not on file  Occupational History  . Not  on file  Tobacco Use  . Smoking status: Former Smoker    Types: Cigarettes    Quit date: 06/15/1983    Years since quitting: 36.9  . Smokeless tobacco: Never Used  Vaping Use  . Vaping Use: Never used  Substance and Sexual Activity  . Alcohol use: Not Currently    Comment: occasional  . Drug use: No  . Sexual activity: Not Currently    Birth control/protection: Post-menopausal  Other Topics Concern  . Not on file  Social History Narrative   Regular exercise: yes   Caffeine use: none   Social Determinants of Health   Financial Resource Strain: Not on file  Food Insecurity: Not on file  Transportation Needs: Not on file  Physical Activity: Not on file  Stress: Not on file  Social Connections: Not on file    Family History:  The patient's family history includes Alcohol abuse in an other family member; Colon cancer in her maternal grandmother and mother; Colon polyps in her sister and sister; Heart disease in her father.   ROS:   Please see the history of present illness.    ROS All other systems reviewed and are negative.  PHYSICAL EXAM:    VS:  BP 138/80   Pulse 83   Ht 5' 4"  (1.626 m)   Wt 167 lb 6.4 oz (75.9 kg)   SpO2 98%   BMI 28.73 kg/m    GEN: Well nourished, well developed, in no acute distress  HEENT: normal  Neck: no JVD, carotid bruits, or masses Cardiac: RRR; no murmurs, rubs, or gallops,no edema  Respiratory:  clear to auscultation bilaterally, normal work of breathing GI: soft, nontender, nondistended, + BS MS: no deformity or atrophy  Skin: warm and dry, no rash Neuro:  Alert and Oriented x 3, Strength and sensation are intact Psych: euthymic mood, full affect  Wt Readings from Last 3 Encounters:  05/28/20 167 lb 6.4 oz (75.9 kg)  05/20/20 168 lb 6.4 oz (76.4 kg)  09/12/19 156 lb (70.8 kg)    Studies/Labs Reviewed:   EKG:  EKG is ordered today.  The ekg ordered today demonstrates SR with PVCs, repolarization abnormality   Recent  Labs: 05/20/2020: ALT 14; BUN 19; Creatinine, Ser 0.74; Hemoglobin 13.6; Platelets 259.0; Potassium 3.6; Sodium 136; TSH 1.30   Lipid Panel    Component Value Date/Time   CHOL 293 (H) 05/20/2020 1422   CHOL 278 (H) 12/25/2018 1204   TRIG 159.0 (H) 05/20/2020 1422   HDL 73.10 05/20/2020 1422   HDL 68 12/25/2018 1204   CHOLHDL 4 05/20/2020 1422   VLDL  31.8 05/20/2020 1422   LDLCALC 188 (H) 05/20/2020 1422   LDLCALC 183 (H) 12/25/2018 1204   LDLDIRECT 168.0 01/02/2018 1459   Additional studies/ records that were reviewed today include:  TTE: 07/23/19 1. Left ventricular ejection fraction, by estimation, is 55 to 60%. The  left ventricle has normal function. There is moderately increased left  ventricular hypertrophy. Left ventricular diastolic parameters are  consistent with Grade I diastolic  dysfunction (impaired relaxation).  2. Right ventricular systolic function is normal. There is normal  pulmonary artery systolic pressure.  3. Mild mitral valve regurgitation.  4. Aortic valve regurgitation is mild.     ASSESSMENT & PLAN:   1. Chest discomfort The patient has significant risk factors including family history of premature coronary artery disease in her father who had a myocardial infarction at age of 80, there is also, hypertension, significant mixed hyperlipidemia and physical inactivity.  We will try to obtain coronary CTA again to evaluate for presence of obstructive coronary artery disease and then we will navigate her therapies based on results.  She previously did not tolerate multiple type of statins, she might be a good candidate for PCSK9 inhibitors.  2.  Nonsustained VT/PVC -I have at length discussion regarding coronary CT in 2 weeks.  Previously she has canceled secondary to family issue.  Discussed risks of sudden cardiac death or arrhythmia of untreated CAD. Advised to continue ASA 47m qd. Hx of statin intolerance.   3. Valvular heart disease - Continue to  follow   4.  Hyperlipidemia -she is adamant about not taking statins, once we get the results from coronary CTA we will refer her to the lipid clinic for further management.  Medication Adjustments/Labs and Tests Ordered: Current medicines are reviewed at length with the patient today.  Concerns regarding medicines are outlined above.  Medication changes, Labs and Tests ordered today are listed in the Patient Instructions below. Patient Instructions  Medication Instructions:   Your physician recommends that you continue on your current medications as directed. Please refer to the Current Medication list given to you today.  *If you need a refill on your cardiac medications before your next appointment, please call your pharmacy*   Testing/Procedures:  Your cardiac CT will be scheduled at one of the below locations:   [facility] [place] [place] [phone] [facility] minutes prior to test start time. Pro[facility] the [facility] (first floor) to check-in and test prep.  Please follow these instructions carefully (unless otherwise directed):  On the Night Before the Test: . Be sure to Drink plenty of water. . Do not consume any caffeinated/decaffeinated beverages or chocolate 12 hours prior to your test. . Do not take any antihistamines 12 hours prior to your test.   On the Day of the Test: . Drink plenty of water. Do not drink any water within one hour of the test. . Do not eat any food 4 hours prior to the test. . You may take your regular medications prior to the test.  . Take metoprolol 100 MG BY MOUTH  (Lopressor) two hours prior to test. . FEMALES- please wear underwire-free bra if available      After the Test: . Drink plenty of water. . After receiving IV contrast, you may experience a mild flushed feeling.  This is normal. . On occasion, you  may experience a mild rash up to 24 hours after the test. This is not dangerous. If this occurs, you can take Benadryl 25 mg and increase your fluid intake. . If you experience trouble breathing, this can be serious. If it is severe call 911 IMMEDIATELY. If it is mild, please call our office.  Once we have confirmed authorization from your insurance company, we will call you to set up a date and time for your test. Based on how quickly your insurance processes prior authorizations requests, please allow up to 4 weeks to be contacted for scheduling your Cardiac CT appointment. Be advised that routine Cardiac CT appointments could be scheduled as many as 8 weeks after your provider has ordered it.  For non-scheduling related questions, please contact the cardiac imaging nurse navigator should you have any questions/concerns: Marchia Bond, Cardiac Imaging Nurse Navigator Burley Saver, Interim Cardiac Imaging Nurse [place] [facility] Direct Office Dial: [phone]   For scheduling needs, including cancellations and rescheduling, please call [place], [phone].   Follow-Up: At [facility], you and your health needs are our priority.  As part of our continuing mission to provide you with exceptional heart care, we have created designated Provider Care Teams.  These Care Teams include your primary Cardiologist (physician) and Advanced Practice Providers (APPs -  Physician Assistants and Nurse Practitioners) who all work together to provide you with the care you need, when you need it.  We recommend signing up for the patient portal called "MyChart".  Sign up information is provided on this After Visit Summary.  MyChart is used to connect with patients for Virtual Visits (Telemedicine).  Patients are able to view lab/test results, encounter notes, upcoming appointments, etc.  Non-urgent messages can be sent to your provider as well.    To learn more about what you can do with MyChart, go to [email or web address].    Your next appointment:   6 month(s)  The format for your next appointment:   In Person  Provider:   Ena Dawley, MD        Signed, Ena Dawley, MD  05/28/2020 12:43 PM    [address] [place], [place], [place]  [address] Phone: [phone]; Fax: [phone]

## 2020-05-30 ENCOUNTER — Ambulatory Visit: Payer: PPO | Attending: Internal Medicine

## 2020-05-30 DIAGNOSIS — Z23 Encounter for immunization: Secondary | ICD-10-CM

## 2020-05-30 NOTE — Progress Notes (Signed)
   Covid-19 Vaccination Clinic  Name:  Jean Smith    MRN: [id number] DOB: 11-09-1950  05/30/2020  Jean Smith was observed post Covid-19 immunization for 15 minutes without incident. She was provided with Vaccine Information Sheet and instruction to access the V-Safe system.   Jean Smith was instructed to call 911 with any severe reactions post vaccine: Marland Kitchen Difficulty breathing  . Swelling of face and throat  . A fast heartbeat  . A bad rash all over body  . Dizziness and weakness   Immunizations Administered    Name Date Dose VIS Date Route   Pfizer COVID-19 Vaccine 05/30/2020  2:01 PM 0.3 mL 04/02/2020 Intramuscular   Manufacturer: [place]   Lot: [id number]   NDC: [address]-1

## 2020-06-16 ENCOUNTER — Telehealth (HOSPITAL_COMMUNITY): Payer: Self-pay | Admitting: *Deleted

## 2020-06-16 NOTE — Telephone Encounter (Signed)
Attempted to call patient regarding upcoming cardiac CT appointment. Left message on voicemail with name and callback number  Kennede Lusk RN Navigator Cardiac Imaging [facility] [phone] Office [phone] Cell  

## 2020-06-18 ENCOUNTER — Ambulatory Visit (HOSPITAL_COMMUNITY): Admission: RE | Admit: 2020-06-18 | Payer: PPO | Source: Ambulatory Visit

## 2020-06-18 ENCOUNTER — Telehealth (HOSPITAL_COMMUNITY): Payer: Self-pay | Admitting: *Deleted

## 2020-06-18 NOTE — Telephone Encounter (Signed)
Pt called to say she won't be able to make today's Cardiac CT appointment because she is not prepared to do so.  Pt has been without power for the past few days.  Pt was rescheduled to June 30, 2020 at 12:15.  Pt aware to get blood work prior to test and states she would be able to on 06/27/20.  Dr. Lindaann Slough office made aware of need for new blood work appointment.  Larey Brick, Cardiac Imaging Nurse navigator.

## 2020-06-19 ENCOUNTER — Telehealth: Payer: Self-pay | Admitting: *Deleted

## 2020-06-19 NOTE — Telephone Encounter (Signed)
-----   Message from Dorette Grate, RN sent at 06/19/2020  8:19 AM EST ----- Elvina Sidle,   Not sure if you got the message I left when I called the office but can you schedule her for labs on Jan 14?  Pt had to be rescheduled for the cardiac CT since she had no power and her labs would be past due. She's been r/s to January 17.  Thanks, Christeen Douglas

## 2020-06-19 NOTE — Telephone Encounter (Signed)
Lab appt for pt to come in and have BMET done, is scheduled for 06/27/20.  This is for upcoming Cardiac CT, and per CT protocol.

## 2020-06-27 ENCOUNTER — Other Ambulatory Visit: Payer: PPO | Admitting: *Deleted

## 2020-06-27 ENCOUNTER — Other Ambulatory Visit: Payer: Self-pay

## 2020-06-27 ENCOUNTER — Telehealth (HOSPITAL_COMMUNITY): Payer: Self-pay | Admitting: Emergency Medicine

## 2020-06-27 DIAGNOSIS — R072 Precordial pain: Secondary | ICD-10-CM | POA: Diagnosis not present

## 2020-06-27 NOTE — Telephone Encounter (Signed)
Attempted to call patient regarding upcoming cardiac CT appointment. °Left message on voicemail with name and callback number °Korah Hufstedler RN Navigator Cardiac Imaging °[place] [facility] °[phone] Office °[phone] Cell ° °

## 2020-06-28 LAB — BASIC METABOLIC PANEL
BUN/Creatinine Ratio: 16 (ref 12–28)
BUN: 12 mg/dL (ref 8–27)
CO2: 25 mmol/L (ref 20–29)
Calcium: 9.8 mg/dL (ref 8.7–10.3)
Chloride: 99 mmol/L (ref 96–106)
Creatinine, Ser: 0.76 mg/dL (ref 0.57–1.00)
GFR calc Af Amer: 94 mL/min/{1.73_m2} (ref >59)
GFR calc non Af Amer: 81 mL/min/{1.73_m2} (ref >59)
Glucose: 122 mg/dL — ABNORMAL HIGH (ref 65–99)
Potassium: 4.2 mmol/L (ref 3.5–5.2)
Sodium: 140 mmol/L (ref 134–144)

## 2020-06-30 ENCOUNTER — Ambulatory Visit (HOSPITAL_COMMUNITY): Admission: RE | Admit: 2020-06-30 | Payer: PPO | Source: Ambulatory Visit

## 2020-07-04 ENCOUNTER — Telehealth (HOSPITAL_COMMUNITY): Payer: Self-pay | Admitting: Emergency Medicine

## 2020-07-04 ENCOUNTER — Telehealth (HOSPITAL_COMMUNITY): Payer: Self-pay | Admitting: *Deleted

## 2020-07-04 NOTE — Telephone Encounter (Signed)
Attempted to call patient regarding upcoming cardiac CT appointment. Left message on voicemail with name and callback number  Devario Bucklew RN Navigator Cardiac Imaging [place] [facility] [phone] Office [phone] Cell  

## 2020-07-04 NOTE — Telephone Encounter (Signed)
Reaching out to patient to offer assistance regarding upcoming cardiac imaging study; pt verbalizes understanding of appt date/time, parking situation and where to check in, pre-test NPO status and medications ordered, and verified current allergies; name and call back number provided for further questions should they arise Kainen Struckman RN Navigator Cardiac Imaging [place] [facility] [phone] office [phone] cell 

## 2020-07-07 ENCOUNTER — Ambulatory Visit (HOSPITAL_COMMUNITY)
Admission: RE | Admit: 2020-07-07 | Discharge: 2020-07-07 | Disposition: A | Discharge status: Home / Self Care | Payer: PPO | Source: Ambulatory Visit | Attending: Cardiology | Admitting: Cardiology

## 2020-07-07 ENCOUNTER — Telehealth (HOSPITAL_COMMUNITY): Payer: Self-pay | Admitting: Emergency Medicine

## 2020-07-07 NOTE — Progress Notes (Signed)
Patient allotted 15 min grace period. Will have to reschedule with CT navigator.

## 2020-07-07 NOTE — Telephone Encounter (Signed)
Calling patient to investigate why she did not show up to her CCTA appt today.  Left a vm to encouarge r/s  Marchia Bond RN Navigator Cardiac Imaging [facility] [phone] Office  [phone] Cell

## 2020-07-28 ENCOUNTER — Other Ambulatory Visit: Payer: Self-pay

## 2020-07-28 MED ORDER — PROPRANOLOL HCL 10 MG PO TABS: 10.0000 mg | ORAL_TABLET | ORAL | 3 refills | Status: DC | PRN

## 2020-08-25 ENCOUNTER — Ambulatory Visit (INDEPENDENT_AMBULATORY_CARE_PROVIDER_SITE_OTHER): Payer: PPO | Admitting: Internal Medicine

## 2020-08-25 ENCOUNTER — Other Ambulatory Visit: Payer: Self-pay

## 2020-08-25 ENCOUNTER — Encounter: Payer: Self-pay | Admitting: Internal Medicine

## 2020-08-25 VITALS — BP 118/80 | HR 81 | Temp 97.7°F | Resp 18 | Ht 64.0 in | Wt 166.2 lb

## 2020-08-25 DIAGNOSIS — M25512 Pain in left shoulder: Secondary | ICD-10-CM | POA: Diagnosis not present

## 2020-08-25 DIAGNOSIS — R7303 Prediabetes: Secondary | ICD-10-CM | POA: Diagnosis not present

## 2020-08-25 DIAGNOSIS — G8929 Other chronic pain: Secondary | ICD-10-CM

## 2020-08-25 DIAGNOSIS — E782 Mixed hyperlipidemia: Secondary | ICD-10-CM

## 2020-08-25 DIAGNOSIS — M25511 Pain in right shoulder: Secondary | ICD-10-CM | POA: Diagnosis not present

## 2020-08-25 LAB — LIPID PANEL
Cholesterol: 259 mg/dL — ABNORMAL HIGH (ref 0–200)
HDL: 65.6 mg/dL (ref >39.00)
LDL Cholesterol: 170 mg/dL — ABNORMAL HIGH (ref 0–99)
NonHDL: 193.71
Total CHOL/HDL Ratio: 4
Triglycerides: 117 mg/dL (ref 0.0–149.0)
VLDL: 23.4 mg/dL (ref 0.0–40.0)

## 2020-08-25 LAB — HEMOGLOBIN A1C: Hgb A1c MFr Bld: 6.4 % (ref 4.6–6.5)

## 2020-08-25 NOTE — Patient Instructions (Signed)
We are checking the labs today and will get you in with physical therapy.

## 2020-08-25 NOTE — Progress Notes (Signed)
   Subjective:   Patient ID: Jean Smith, female    DOB: [birth year], 70 y.o.   MRN: [id number]  HPI The patient is a 70 YO female coming in for follow up cholesterol (did not follow up with CT calcium score from cardiology as she did not feel right about it and is not sure she wants to know if there is a problem with her heart, is taking fish oil and felt that taking statins in the past gave her muscle problems, has been on simvastatin in the past) and pre-diabetes (diet is poor due to stress, is not exercising, denies excessive thirst or urination, denies numbness in feet or hands) and bilateral shoulder pain (having gradually worsening pain, did PT in the past which helped significantly, would like to try again).   Review of Systems  Constitutional: Negative.   HENT: Negative.   Eyes: Negative.   Respiratory: Negative for cough, chest tightness and shortness of breath.   Cardiovascular: Negative for chest pain, palpitations and leg swelling.  Gastrointestinal: Negative for abdominal distention, abdominal pain, constipation, diarrhea, nausea and vomiting.  Musculoskeletal: Positive for arthralgias.  Skin: Negative.   Neurological: Negative.   Psychiatric/Behavioral: Negative.     Objective:  Physical Exam Constitutional:      Appearance: She is well-developed.  HENT:     Head: Normocephalic and atraumatic.  Cardiovascular:     Rate and Rhythm: Normal rate and regular rhythm.  Pulmonary:     Effort: Pulmonary effort is normal. No respiratory distress.     Breath sounds: Normal breath sounds. No wheezing or rales.  Abdominal:     General: Bowel sounds are normal. There is no distension.     Palpations: Abdomen is soft.     Tenderness: There is no abdominal tenderness. There is no rebound.  Musculoskeletal:     Cervical back: Normal range of motion.  Skin:    General: Skin is warm and dry.  Neurological:     Mental Status: She is alert and oriented to person, place, and time.      Coordination: Coordination normal.  Psychiatric:     Comments: Hard to redirect     Vitals:   08/25/20 1010  BP: 118/80  Pulse: 81  Resp: 18  Temp: 97.7 F (36.5 C)  TempSrc: Oral  SpO2: 97%  Weight: 166 lb 3.2 oz (75.4 kg)  Height: 5\' 4"  (1.626 m)    This visit occurred during the SARS-CoV-2 public health emergency.  Safety protocols were in place, including screening questions prior to the visit, additional usage of staff PPE, and extensive cleaning of exam room while observing appropriate contact time as indicated for disinfecting solutions.   Assessment & Plan:

## 2020-08-27 DIAGNOSIS — R7303 Prediabetes: Secondary | ICD-10-CM | POA: Insufficient documentation

## 2020-08-27 DIAGNOSIS — E118 Type 2 diabetes mellitus with unspecified complications: Secondary | ICD-10-CM | POA: Insufficient documentation

## 2020-08-27 NOTE — Assessment & Plan Note (Signed)
Checking HgA1c and discussed the need for dietary modification. We also discussed if she does progress to diabetes her already elevated risk for CV disease would double.

## 2020-08-27 NOTE — Assessment & Plan Note (Signed)
Referral to PT to help reduce pain.

## 2020-08-27 NOTE — Assessment & Plan Note (Signed)
Checking lipid panel and should be on statin but she does not want to try that at this time.

## 2020-09-10 ENCOUNTER — Telehealth: Payer: Self-pay | Admitting: Internal Medicine

## 2020-09-10 ENCOUNTER — Other Ambulatory Visit: Payer: Self-pay

## 2020-09-10 ENCOUNTER — Ambulatory Visit: Payer: PPO | Attending: Internal Medicine | Admitting: Physical Therapy

## 2020-09-10 DIAGNOSIS — M25511 Pain in right shoulder: Secondary | ICD-10-CM | POA: Diagnosis not present

## 2020-09-10 DIAGNOSIS — M25512 Pain in left shoulder: Secondary | ICD-10-CM | POA: Diagnosis not present

## 2020-09-10 DIAGNOSIS — G8929 Other chronic pain: Secondary | ICD-10-CM

## 2020-09-10 DIAGNOSIS — R252 Cramp and spasm: Secondary | ICD-10-CM

## 2020-09-10 DIAGNOSIS — M25562 Pain in left knee: Secondary | ICD-10-CM

## 2020-09-10 NOTE — Telephone Encounter (Signed)
Patient called and was wondering if a referral for her lower extremities(hips and knees) for PT to be sent to [place] at [facility]. Please advise. She can be reached at [phone]

## 2020-09-10 NOTE — Therapy (Signed)
[facility] [address] [place], [place], [address] Phone: [phone]   Fax:  [phone]  Physical Therapy Evaluation  Patient Details  Name: Jean Smith MRN: [id number] Date of Birth: 02/09/1951 Referring Provider (PT): Pricilla Holm, MD   Encounter Date: 09/10/2020   PT End of Session - 09/10/20 1656    Visit Number 1    Number of Visits 24    Date for PT Re-Evaluation 12/03/20    Authorization Type [employer]    PT Start Time 1022   Increased time at check in   PT Stop Time 1100    PT Time Calculation (min) 38 min    Activity Tolerance Patient tolerated treatment well    Behavior During Therapy [facility] for tasks assessed/performed           Past Medical History:  Diagnosis Date  . Allergy   . Anxiety   . Arthritis   . Cancer ([place])    skin  . Cataract    bilateral - MD is just watching   . Diverticulosis   . Fuchs' endothelial dystrophy    follows with optho regularly   . Gestational diabetes   . Heart murmur    MVP   . History of depression   . HSV-2 infection   . Hyperlipidemia    no meds - diet controlled  . Hyperplastic colon polyp   . Mitral valve prolapse   . PVC's (premature ventricular contractions)    intol of BB, sxc palpitations r/t stress  . RVOT ventricular tachycardia/PVCs    EP eval 01/2013 for freq PVCs    Past Surgical History:  Procedure Laterality Date  . CESAREAN SECTION     x 3  . COLONOSCOPY    . KNEE SURGERY Right   . MANDIBLE SURGERY     right side in front of ear  . MOHS SURGERY  2021  . POLYPECTOMY    . WISDOM TOOTH EXTRACTION      There were no vitals filed for this visit.    Subjective Assessment - 09/10/20 1024    Subjective Patient presenting due to bilateral shoulder pain. States that she feels that she is not sure if her shoulders are the most pressing issue. Has been having increased difficulty transferring from commode and in/out  of chairs at home. Notes that she has been sedentary for the last year as her son suffered a TBI and had to move back home March 2021 so she has to sit in a chair all day to watch him. Patient states that L shoulder is worse than Rt shoulder but both are painful. Feels that she is too young to feel like this.    Pertinent History arthritis, history of cancer, depression    Limitations House hold activities    How long can you sit comfortably? unlimited    How long can you stand comfortably? unlimited    How long can you walk comfortably? unlimited    Diagnostic tests None    Currently in Pain? Yes    Pain Score 6     Pain Location Shoulder    Pain Orientation Right;Left    Pain Descriptors / Indicators Aching    Pain Type Chronic pain    Pain Radiating Towards distal UE    Pain Onset More than a month ago    Pain Frequency Constant    Aggravating Factors  lying supine; pushing down on soap dispenser; reaching second shelf;  putting dishes in/taking dishes out of microwave    Multiple Pain Sites No              [facility] PT Assessment - 09/10/20 0001      Assessment   Medical Diagnosis M25.511,G89.29,M25.512 (ICD-10-CM) - Chronic pain of both shoulders    Referring Provider (PT) Pricilla Holm, MD    Onset Date/Surgical Date --   2 years ago   Hand Dominance Right    Next MD Visit 6 weeks from today    Prior Therapy Yes- Rt shoulder 2020      Precautions   Precautions None      Restrictions   Weight Bearing Restrictions No      Balance Screen   Has the patient fallen in the past 6 months No    Has the patient had a decrease in activity level because of a fear of falling?  Yes    Is the patient reluctant to leave their home because of a fear of falling?  No      Home Ecologist residence    Living Arrangements Spouse/significant other    Type of [place] One level      Prior Function   Level of [place] Retired      ROM / Strength   AROM / PROM / Strength AROM;Strength      AROM   AROM Assessment Site Shoulder;Cervical    Right/Left Shoulder Right;Left    Right Shoulder ABduction 95 Degrees    Right Shoulder External Rotation --   spine of scapula   Left Shoulder ABduction 102 Degrees    Left Shoulder External Rotation --   spine of scapula   Cervical Flexion 50    Cervical Extension 41    Cervical - Right Side Bend 25    Cervical - Left Side Bend 28    Cervical - Right Rotation 51    Cervical - Left Rotation 62      Strength   Strength Assessment Site Shoulder    Right/Left Shoulder Right;Left    Right Shoulder Flexion 4-/5    Right Shoulder ABduction 4-/5    Right Shoulder Internal Rotation 5/5    Right Shoulder External Rotation 5/5    Left Shoulder Flexion 4+/5    Left Shoulder ABduction 4-/5    Left Shoulder Internal Rotation 5/5    Left Shoulder External Rotation 5/5      Palpation   Palpation comment bilateral upper trap, levator scap tender to palpation; Lt middle deltoid tender to palpation      Special Tests    Special Tests Rotator Cuff Impingement    Rotator Cuff Impingment tests Michel Bickers test;Empty Can test;Full Can test      Hawkins-Eskenazi test   Findings Positive    Side Left      Empty Can test   Findings Positive    Side Left      Full Can test   Findings Positive    Side Left                      Objective measurements completed on examination: See above findings.                 PT Short Term Goals - 09/10/20 1705      PT SHORT TERM GOAL #1   Title Patient will be independent with  HEP for continued progression at home.    Time 6    Period Weeks    Status New    Target Date 10/22/20      PT SHORT TERM GOAL #2   Title Patient will improve shoulder flexion to 120 degrees bilaterally for improved overhead reaching.    Baseline Lt 102; Rt 95    Time 6    Period Weeks    Status New     Target Date 10/22/20      PT SHORT TERM GOAL #3   Title Patient will report no more than 4/10 pain for two consecutive sessions to indicate improved UE activity tolerance to more readily complete ADLs    Time 6    Period Weeks    Status New    Target Date 10/22/20             PT Long Term Goals - 09/10/20 1707      PT LONG TERM GOAL #1   Title Patient will be independent with advanced HEP for long term management of symptoms post D/C.    Time 12    Period Weeks    Status New    Target Date 12/03/20      PT LONG TERM GOAL #2   Title FOTO goal    Time 12    Period Weeks    Status New    Target Date 12/03/20      PT LONG TERM GOAL #3   Title Patient will demonstrate 140 degrees flexion bilaterally for improved overhead reaching    Time 12    Period Weeks    Status New    Target Date 12/03/20      PT LONG TERM GOAL #4   Title Patient will raise/lower 4# from waist to overhead height without increased pain for improved ability to complete ADLs.    Time 12    Period Weeks    Status New    Target Date 12/03/20                  Plan - 09/10/20 1657    Clinical Impression Statement Patient is a 70 y/o female referred due to chronic bilateral shoulder pain. PMH includes cancer, depression, and arthritis. Patient plans to get additional referral for gait and generalized weakness. Patient reports that she has not taken care of herself well since her son suffered a TBI March 2021 and she had to become his primary caregiver. She reports activity limitations to include washing dishes, reaching overhead, and carrying heavier items out away from her body. She demonstrates significant AROM impairments of bil shoulders and cervical spine. Bil shoulder strength impairments apparent as well. Patient reports increased pain with reaching behind back. Therapist noting increased capsular laxity bilaterally with GH mobilizations. Patient would benefit from skilled therapeutic  intervention to address impairments for improved functional use of bil UEs to more readily complete ADLs.    Personal Factors and Comorbidities Comorbidity 3+    Comorbidities arthritis, cancer, depression    Examination-Activity Limitations Carry;Caring for Others;Reach Overhead;Sleep    Examination-Participation Restrictions Meal Prep;Cleaning;Laundry    Stability/Clinical Decision Making Stable/Uncomplicated    Clinical Decision Making Low    Rehab Potential Good    PT Frequency 2x / week    PT Duration 12 weeks    PT Treatment/Interventions ADLs/Self Care Home Management;Cryotherapy;Electrical Stimulation;Iontophoresis 4mg /ml Dexamethasone;Moist Heat;Traction;Therapeutic activities;Therapeutic exercise;Neuromuscular re-education;Patient/family education;Manual techniques;Passive range of motion;Dry needling;Taping;Spinal Manipulations;Joint Manipulations    PT Next Visit Plan  establish HEP; FOTO goal; introduce DN    Consulted and Agree with Plan of Care Patient           Patient will benefit from skilled therapeutic intervention in order to improve the following deficits and impairments:  Decreased activity tolerance,Decreased range of motion,Decreased strength,Hypomobility,Increased muscle spasms,Impaired UE functional use,Postural dysfunction,Improper body mechanics,Pain  Visit Diagnosis: Cramp and spasm - Plan: PT plan of care cert/re-cert  Chronic left shoulder pain - Plan: PT plan of care cert/re-cert  Chronic right shoulder pain - Plan: PT plan of care cert/re-cert     Problem List Patient Active Problem List   Diagnosis Date Noted  . Pre-diabetes 08/27/2020  . Chronic pain of both shoulders 05/05/2018  . Vitamin D deficiency 05/05/2018  . Other fatigue 01/02/2018  . Allergic rhinitis 01/02/2018  . Weight gain 01/02/2018  . PVC (premature ventricular contraction) 03/09/2017  . Routine general medical examination at a health care facility 09/01/2015  .  Hyperlipidemia 09/01/2015  . Varicose vein 08/27/2014  . Primary localized osteoarthrosis, lower leg 10/30/2013  . Anxiety state 03/23/2013  . 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 Depression 01/01/2013  . Abnormal stress echo 12/11/2012  . Palpitations 09/14/2010    Everardo All PT, DPT  09/10/20 5:13 PM   [facility] [address] [place], [place], [address] Phone: [phone]   Fax:  [phone]  Name: Jean Smith MRN: [id number] Date of Birth: 1950-09-07

## 2020-09-10 NOTE — Telephone Encounter (Signed)
See below

## 2020-09-11 ENCOUNTER — Encounter: Payer: Self-pay | Admitting: Cardiology

## 2020-09-12 NOTE — Telephone Encounter (Signed)
Referral placed.

## 2020-09-17 ENCOUNTER — Encounter: Payer: Self-pay | Admitting: Physical Therapy

## 2020-09-17 ENCOUNTER — Ambulatory Visit: Payer: PPO | Attending: Internal Medicine | Admitting: Physical Therapy

## 2020-09-17 ENCOUNTER — Other Ambulatory Visit: Payer: Self-pay

## 2020-09-17 DIAGNOSIS — G8929 Other chronic pain: Secondary | ICD-10-CM | POA: Insufficient documentation

## 2020-09-17 DIAGNOSIS — M25551 Pain in right hip: Secondary | ICD-10-CM

## 2020-09-17 DIAGNOSIS — R252 Cramp and spasm: Secondary | ICD-10-CM | POA: Diagnosis not present

## 2020-09-17 DIAGNOSIS — M25552 Pain in left hip: Secondary | ICD-10-CM | POA: Insufficient documentation

## 2020-09-17 DIAGNOSIS — M25512 Pain in left shoulder: Secondary | ICD-10-CM | POA: Insufficient documentation

## 2020-09-17 DIAGNOSIS — M25511 Pain in right shoulder: Secondary | ICD-10-CM | POA: Insufficient documentation

## 2020-09-17 NOTE — Patient Instructions (Signed)
Access Code: [email or web address] URL: [email or web address] Date: 09/17/2020 Prepared by: Everardo All  Program Notes Perform for both legs.   Exercises Bridge with Resistance - 1 x daily - 7 x weekly - 2 sets - 10 reps Clamshell with Resistance - 1 x daily - 7 x weekly - 2 sets - 10 reps Seated Hip Adduction Isometrics with Ball - 1 x daily - 7 x weekly - 2 sets - 10 reps Seated March - 1 x daily - 7 x weekly - 2 sets - 10 reps Heel rises with counter support - 1 x daily - 7 x weekly - 2 sets - 10 reps

## 2020-09-17 NOTE — Therapy (Signed)
[facility] [place] [facility] [address] [place], [place], [address] Phone: [phone]   Fax:  [phone]  Physical Therapy Treatment  Patient Details  Name: Jean Smith MRN: [id number] Date of Birth: Oct 12, 1950 Referring Provider (PT): Pricilla Holm, MD   Encounter Date: 09/17/2020   PT End of Session - 09/17/20 1315    Visit Number 2    Number of Visits 24    Date for PT Re-Evaluation 12/03/20    Authorization Type [employer]    PT Start Time 1151   Patient arriving late   PT Stop Time 1230    PT Time Calculation (min) 39 min    Activity Tolerance Patient tolerated treatment well    Behavior During Therapy [facility] for tasks assessed/performed           Past Medical History:  Diagnosis Date  . Allergy   . Anxiety   . Arthritis   . Cancer ([place])    skin  . Cataract    bilateral - MD is just watching   . Diverticulosis   . Fuchs' endothelial dystrophy    follows with optho regularly   . Gestational diabetes   . Heart murmur    MVP   . History of depression   . HSV-2 infection   . Hyperlipidemia    no meds - diet controlled  . Hyperplastic colon polyp   . Mitral valve prolapse   . PVC's (premature ventricular contractions)    intol of BB, sxc palpitations r/t stress  . RVOT ventricular tachycardia/PVCs    EP eval 01/2013 for freq PVCs    Past Surgical History:  Procedure Laterality Date  . CESAREAN SECTION     x 3  . COLONOSCOPY    . KNEE SURGERY Right   . MANDIBLE SURGERY     right side in front of ear  . MOHS SURGERY  2021  . POLYPECTOMY    . WISDOM TOOTH EXTRACTION      There were no vitals filed for this visit.   Subjective Assessment - 09/17/20 1153    Subjective Is happy to have new PT order. States that she hurts all over.    Pertinent History arthritis, history of cancer, depression    Limitations House hold activities    How long can you sit comfortably? unlimited    How  long can you stand comfortably? unlimited    How long can you walk comfortably? unlimited    Diagnostic tests None    Currently in Pain? Yes    Pain Score 6     Pain Location Shoulder    Pain Orientation Left    Pain Descriptors / Indicators Aching    Pain Type Chronic pain    Pain Onset More than a month ago    Multiple Pain Sites Yes    Pain Score 7    Pain Location Knee    Pain Orientation Right    Pain Descriptors / Indicators Throbbing    Pain Type Chronic pain    Pain Onset More than a month ago    Pain Frequency Intermittent              [facility] PT Assessment - 09/17/20 0001      Strength   Strength Assessment Site Hip;Knee    Right/Left Shoulder Right;Left    Right/Left Hip Right;Left    Right Hip Flexion 4/5    Right Hip Extension 4/5    Right Hip ABduction  4-/5    Right Hip ADduction 4-/5    Left Hip Flexion 4/5    Left Hip Extension 3+/5    Left Hip ABduction 4/5    Left Hip ADduction 4-/5    Right/Left Knee Right;Left    Right Knee Flexion 4+/5    Right Knee Extension 4+/5    Left Knee Flexion 4/5    Left Knee Extension 4/5      Balance   Balance Assessed Yes      Standardized Balance Assessment   Standardized Balance Assessment Five Times Sit to Stand    Five times sit to stand comments  16s; impaired eccentric control                         [facility] Adult PT Treatment/Exercise - 09/17/20 0001      Exercises   Exercises Knee/Hip      Knee/Hip Exercises: Machines for Strengthening   Total Gym Leg Press x10 repetitions; 40#      Knee/Hip Exercises: Standing   Heel Raises Both;1 set;10 reps    Heel Raises Limitations VC for decreased UE support      Knee/Hip Exercises: Seated   Knee/Hip Flexion x10 bil LE      Knee/Hip Exercises: Supine   Bridges Both;1 set;10 reps    Bridges Limitations yellow loop      Knee/Hip Exercises: Sidelying   Clams x10 repetitions bil LE; yellow loop                  PT Education -  09/17/20 1229    Education Details Access Code: [email or web address]    Person(s) Educated Patient    Methods Explanation;Demonstration;Tactile cues;Verbal cues;Handout    Comprehension Verbalized understanding;Returned demonstration;Verbal cues required;Tactile cues required;Need further instruction            PT Short Term Goals - 09/10/20 1705      PT SHORT TERM GOAL #1   Title Patient will be independent with HEP for continued progression at home.    Time 6    Period Weeks    Status New    Target Date 10/22/20      PT SHORT TERM GOAL #2   Title Patient will improve shoulder flexion to 120 degrees bilaterally for improved overhead reaching.    Baseline Lt 102; Rt 95    Time 6    Period Weeks    Status New    Target Date 10/22/20      PT SHORT TERM GOAL #3   Title Patient will report no more than 4/10 pain for two consecutive sessions to indicate improved UE activity tolerance to more readily complete ADLs    Time 6    Period Weeks    Status New    Target Date 10/22/20             PT Long Term Goals - 09/10/20 1707      PT LONG TERM GOAL #1   Title Patient will be independent with advanced HEP for long term management of symptoms post D/C.    Time 12    Period Weeks    Status New    Target Date 12/03/20      PT LONG TERM GOAL #2   Title FOTO goal    Time 12    Period Weeks    Status New    Target Date 12/03/20      PT LONG TERM GOAL #3  Title Patient will demonstrate 140 degrees flexion bilaterally for improved overhead reaching    Time 12    Period Weeks    Status New    Target Date 12/03/20      PT LONG TERM GOAL #4   Title Patient will raise/lower 4# from waist to overhead height without increased pain for improved ability to complete ADLs.    Time 12    Period Weeks    Status New    Target Date 12/03/20                 Plan - 09/17/20 1312    Clinical Impression Statement Patient receiving new order for bilateral hip pain. All hip and knee  stress testing negative. Pain appears to be muscular in etiology. Completing 5x sit to stand in 16s indicating increased fall risk. Noting bil LE strength impairments with Lt > Rt. Noting mild crepitus with sup/inf patellar mobs of Lt knee. Patient requiring tactile cues for decreased trunk extension when performing seated hip flexion. Would benefit from continued skilled intervention to address impairments for decreased fall risk and improved functional activity tolerance.    Personal Factors and Comorbidities Comorbidity 3+    Comorbidities arthritis, cancer, depression    Examination-Activity Limitations Carry;Caring for Others;Reach Overhead;Sleep    Examination-Participation Restrictions Meal Prep;Cleaning;Laundry    Rehab Potential Good    PT Frequency 2x / week    PT Duration 12 weeks    PT Treatment/Interventions ADLs/Self Care Home Management;Cryotherapy;Electrical Stimulation;Iontophoresis 4mg /ml Dexamethasone;Moist Heat;Traction;Therapeutic activities;Therapeutic exercise;Neuromuscular re-education;Patient/family education;Manual techniques;Passive range of motion;Dry needling;Taping;Spinal Manipulations;Joint Manipulations    PT Next Visit Plan review HEP; continued bil LE strengthening; begin scapular strengthening    PT Home Exercise Plan Access Code: [email or web address]    Consulted and Agree with Plan of Care Patient           Patient will benefit from skilled therapeutic intervention in order to improve the following deficits and impairments:  Decreased activity tolerance,Decreased range of motion,Decreased strength,Hypomobility,Increased muscle spasms,Impaired UE functional use,Postural dysfunction,Improper body mechanics,Pain  Visit Diagnosis: Cramp and spasm  Chronic left shoulder pain  Chronic right shoulder pain  Pain in left hip  Pain in right hip     Problem List Patient Active Problem List   Diagnosis Date Noted  . Pre-diabetes 08/27/2020  . Chronic pain of both  shoulders 05/05/2018  . Vitamin D deficiency 05/05/2018  . Other fatigue 01/02/2018  . Allergic rhinitis 01/02/2018  . Weight gain 01/02/2018  . PVC (premature ventricular contraction) 03/09/2017  . Routine general medical examination at a health care facility 09/01/2015  . Hyperlipidemia 09/01/2015  . Varicose vein 08/27/2014  . Primary localized osteoarthrosis, lower leg 10/30/2013  . Anxiety state 03/23/2013  . 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 Depression 01/01/2013  . Abnormal stress echo 12/11/2012  . Palpitations 09/14/2010   Everardo All PT, DPT  09/17/20 1:17 PM   [facility] [address] [place], [place], [address] Phone: [phone]   Fax:  [phone]  Name: Jean Smith MRN: [id number] Date of Birth: [birth year]

## 2020-09-19 ENCOUNTER — Ambulatory Visit: Payer: PPO | Admitting: Physical Therapy

## 2020-09-19 ENCOUNTER — Other Ambulatory Visit: Payer: Self-pay

## 2020-09-19 DIAGNOSIS — R252 Cramp and spasm: Secondary | ICD-10-CM

## 2020-09-19 DIAGNOSIS — G8929 Other chronic pain: Secondary | ICD-10-CM

## 2020-09-19 DIAGNOSIS — M25551 Pain in right hip: Secondary | ICD-10-CM

## 2020-09-19 DIAGNOSIS — M25552 Pain in left hip: Secondary | ICD-10-CM

## 2020-09-19 NOTE — Patient Instructions (Signed)
Access Code: [email or web address] URL: [email or web address] Date: 09/19/2020 Prepared by: Ruben Im  Program Notes Perform for both legs.   Exercises Bridge with Resistance - 1 x daily - 7 x weekly - 2 sets - 10 reps Clamshell with Resistance - 1 x daily - 7 x weekly - 2 sets - 10 reps Seated Hip Adduction Isometrics with Ball - 1 x daily - 7 x weekly - 2 sets - 10 reps Seated March - 1 x daily - 7 x weekly - 2 sets - 10 reps Heel rises with counter support - 1 x daily - 7 x weekly - 2 sets - 10 reps Hooklying Transversus Abdominis Palpation - 1 x daily - 7 x weekly - 1 sets - 10 reps Hooklying Isometric Hip Flexion - 1 x daily - 7 x weekly - 1 sets - 10 reps Supine 90/90 Abdominal Bracing - 1 x daily - 7 x weekly - 5 reps

## 2020-09-19 NOTE — Therapy (Signed)
[facility] [address] [place], [place], [address] Phone: [phone]   Fax:  [phone]  Physical Therapy Treatment  Patient Details  Name: Jean Smith MRN: [id number] Date of Birth: [birth year] Referring Provider (PT): Pricilla Holm, MD   Encounter Date: 09/19/2020   PT End of Session - 09/19/20 1153    Visit Number 3    Number of Visits 24    Date for PT Re-Evaluation 12/03/20    Authorization Type [employer]    PT Start Time 1015    PT Stop Time 1057    PT Time Calculation (min) 42 min    Activity Tolerance Patient tolerated treatment well           Past Medical History:  Diagnosis Date  . Allergy   . Anxiety   . Arthritis   . Cancer ([place])    skin  . Cataract    bilateral - MD is just watching   . Diverticulosis   . Fuchs' endothelial dystrophy    follows with optho regularly   . Gestational diabetes   . Heart murmur    MVP   . History of depression   . HSV-2 infection   . Hyperlipidemia    no meds - diet controlled  . Hyperplastic colon polyp   . Mitral valve prolapse   . PVC's (premature ventricular contractions)    intol of BB, sxc palpitations r/t stress  . RVOT ventricular tachycardia/PVCs    EP eval 01/2013 for freq PVCs    Past Surgical History:  Procedure Laterality Date  . CESAREAN SECTION     x 3  . COLONOSCOPY    . KNEE SURGERY Right   . MANDIBLE SURGERY     right side in front of ear  . MOHS SURGERY  2021  . POLYPECTOMY    . WISDOM TOOTH EXTRACTION      There were no vitals filed for this visit.   Subjective Assessment - 09/19/20 1017    Subjective It was easy while here  but it was harder than I thought later. Sore all over.   Getting up off the toilet is even difficult.    Pertinent History arthritis, history of cancer, depression    Currently in Pain? Yes    Pain Score 6     Pain Location Back    Pain Orientation Lower    Pain Radiating  Towards legs                             [facility] Adult PT Treatment/Exercise - 09/19/20 0001      Knee/Hip Exercises: Stretches   Other Knee/Hip Stretches 2nd step hip flexor stretch 10x right/left      Knee/Hip Exercises: Machines for Strengthening   Total Gym Leg Press 50# bil 25X      Knee/Hip Exercises: Standing   Heel Raises Both;1 set;10 reps      Knee/Hip Exercises: Supine   Other Supine Knee/Hip Exercises abdominal brace 5x; ab brace with heel slides using slider 10x each    Other Supine Knee/Hip Exercises ab set with knee to opp hand resist 10x; ab set with knee lift 90/90 6x      Shoulder Exercises: Standing   Extension Strengthening;20 reps    Theraband Level (Shoulder Extension) Level 2 (Red)    Row Both;20 reps    Theraband Level (Shoulder Row) Level 2 (Red)  Shoulder Exercises: ROM/Strengthening   UBE (Upper Arm Bike) 4 min forward and backward    Ranger on 2nd step 10x right/left    Pushups 10 reps    Pushups Limitations on counter                  PT Education - 09/19/20 1152    Education Details ab set in supine; ab set with hand to opp knee push; ab set with hip/knee 90/90 lifts    Person(s) Educated Patient    Methods Explanation;Demonstration;Handout    Comprehension Returned demonstration;Verbalized understanding            PT Short Term Goals - 09/10/20 1705      PT SHORT TERM GOAL #1   Title Patient will be independent with HEP for continued progression at home.    Time 6    Period Weeks    Status New    Target Date 10/22/20      PT SHORT TERM GOAL #2   Title Patient will improve shoulder flexion to 120 degrees bilaterally for improved overhead reaching.    Baseline Lt 102; Rt 95    Time 6    Period Weeks    Status New    Target Date 10/22/20      PT SHORT TERM GOAL #3   Title Patient will report no more than 4/10 pain for two consecutive sessions to indicate improved UE activity tolerance to more readily  complete ADLs    Time 6    Period Weeks    Status New    Target Date 10/22/20             PT Long Term Goals - 09/10/20 1707      PT LONG TERM GOAL #1   Title Patient will be independent with advanced HEP for long term management of symptoms post D/C.    Time 12    Period Weeks    Status New    Target Date 12/03/20      PT LONG TERM GOAL #2   Title FOTO goal    Time 12    Period Weeks    Status New    Target Date 12/03/20      PT LONG TERM GOAL #3   Title Patient will demonstrate 140 degrees flexion bilaterally for improved overhead reaching    Time 12    Period Weeks    Status New    Target Date 12/03/20      PT LONG TERM GOAL #4   Title Patient will raise/lower 4# from waist to overhead height without increased pain for improved ability to complete ADLs.    Time 12    Period Weeks    Status New    Target Date 12/03/20                 Plan - 09/19/20 1154    Clinical Impression Statement The patient reports generalized discomfort and soreness on arrival but is able to perform UE, trunk and LE mobility and strengthening ex's without exacerbation of symptoms.  Good transverse abdominus activation in supine but difficulty with 90/90 bent knee raises.  Therapist monitoring response throughout treatment session.    Comorbidities arthritis, cancer, depression    Examination-Activity Limitations Carry;Caring for Others;Reach Overhead;Sleep    Rehab Potential Good    PT Frequency 2x / week    PT Duration 12 weeks    PT Treatment/Interventions ADLs/Self Care Home Management;Cryotherapy;Electrical Stimulation;Iontophoresis 4mg /ml Dexamethasone;Moist Heat;Traction;Therapeutic activities;Therapeutic  exercise;Neuromuscular re-education;Patient/family education;Manual techniques;Passive range of motion;Dry needling;Taping;Spinal Manipulations;Joint Manipulations    PT Next Visit Plan review HEP; continued bil LE strengthening; begin scapular strengthening; review  abdominal set and series from HEP as needed; increase weight on leg press    PT Home Exercise Plan Access Code: [email or web address]           Patient will benefit from skilled therapeutic intervention in order to improve the following deficits and impairments:  Decreased activity tolerance,Decreased range of motion,Decreased strength,Hypomobility,Increased muscle spasms,Impaired UE functional use,Postural dysfunction,Improper body mechanics,Pain  Visit Diagnosis: Cramp and spasm  Chronic left shoulder pain  Chronic right shoulder pain  Pain in left hip  Pain in right hip     Problem List Patient Active Problem List   Diagnosis Date Noted  . Pre-diabetes 08/27/2020  . Chronic pain of both shoulders 05/05/2018  . Vitamin D deficiency 05/05/2018  . Other fatigue 01/02/2018  . Allergic rhinitis 01/02/2018  . Weight gain 01/02/2018  . PVC (premature ventricular contraction) 03/09/2017  . Routine general medical examination at a health care facility 09/01/2015  . Hyperlipidemia 09/01/2015  . Varicose vein 08/27/2014  . Primary localized osteoarthrosis, lower leg 10/30/2013  . Anxiety state 03/23/2013  . 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 Depression 01/01/2013  . Abnormal stress echo 12/11/2012  . Palpitations 09/14/2010   Ruben Im, PT 09/19/20 11:58 AM Phone: [phone] Fax: [phone] Alvera Singh 09/19/2020, 11:58 AM  [facility] [address], [place], [place], [address] Phone: [phone]   Fax:  [phone]  Name: DANNELLE RHYMES MRN: [id number] Date of Birth: 1950-08-10

## 2020-09-24 ENCOUNTER — Other Ambulatory Visit: Payer: Self-pay

## 2020-09-24 ENCOUNTER — Encounter: Payer: Self-pay | Admitting: Physical Therapy

## 2020-09-24 ENCOUNTER — Ambulatory Visit: Payer: PPO | Admitting: Physical Therapy

## 2020-09-24 DIAGNOSIS — R252 Cramp and spasm: Secondary | ICD-10-CM

## 2020-09-24 DIAGNOSIS — M25552 Pain in left hip: Secondary | ICD-10-CM

## 2020-09-24 DIAGNOSIS — M25551 Pain in right hip: Secondary | ICD-10-CM

## 2020-09-24 DIAGNOSIS — G8929 Other chronic pain: Secondary | ICD-10-CM

## 2020-09-24 NOTE — Therapy (Signed)
[facility] [address], [place], [place], [address] Phone: [phone]   Fax:  [phone]  Physical Therapy Treatment  Patient Details  Name: Jean Smith MRN: [id number] Date of Birth: 09/15/1950 Referring Provider (PT): Pricilla Holm, MD   Encounter Date: 09/24/2020   PT End of Session - 09/24/20 1309    Visit Number 4    Number of Visits 24    Date for PT Re-Evaluation 12/03/20    Authorization Type [employer]    Progress Note Due on Visit 10    PT Start Time 1232    PT Stop Time 1310    PT Time Calculation (min) 38 min    Activity Tolerance Patient tolerated treatment well    Behavior During Therapy [facility] for tasks assessed/performed           Past Medical History:  Diagnosis Date  . Allergy   . Anxiety   . Arthritis   . Cancer ([place])    skin  . Cataract    bilateral - MD is just watching   . Diverticulosis   . Fuchs' endothelial dystrophy    follows with optho regularly   . Gestational diabetes   . Heart murmur    MVP   . History of depression   . HSV-2 infection   . Hyperlipidemia    no meds - diet controlled  . Hyperplastic colon polyp   . Mitral valve prolapse   . PVC's (premature ventricular contractions)    intol of BB, sxc palpitations r/t stress  . RVOT ventricular tachycardia/PVCs    EP eval 01/2013 for freq PVCs    Past Surgical History:  Procedure Laterality Date  . CESAREAN SECTION     x 3  . COLONOSCOPY    . KNEE SURGERY Right   . MANDIBLE SURGERY     right side in front of ear  . MOHS SURGERY  2021  . POLYPECTOMY    . WISDOM TOOTH EXTRACTION      There were no vitals filed for this visit.   Subjective Assessment - 09/24/20 1246    Subjective "I didn't get enough sleep last night"    Pertinent History arthritis, history of cancer, depression    Limitations House hold activities    How long can you sit comfortably? unlimited    How long can you  stand comfortably? unlimited    How long can you walk comfortably? unlimited    Diagnostic tests None    Currently in Pain? Yes    Pain Score 5     Pain Location Leg    Pain Orientation Right;Lower;Mid    Pain Descriptors / Indicators Aching    Pain Type Chronic pain    Pain Onset More than a month ago    Pain Frequency Constant                             [facility] Adult PT Treatment/Exercise - 09/24/20 0001      Knee/Hip Exercises: Aerobic   Recumbent Bike lvl 1 x 4 minutes      Knee/Hip Exercises: Machines for Strengthening   Total Gym Leg Press 55# 2 x 10 repetitions      Knee/Hip Exercises: Standing   Other Standing Knee Exercises dead lift from mat table; 10# x5 repetitions      Knee/Hip Exercises: Supine   Other Supine Knee/Hip Exercises ab bracing x10  Other Supine Knee/Hip Exercises ab set with march x5      Shoulder Exercises: Standing   Extension Strengthening;Both;10 reps    Theraband Level (Shoulder Extension) Level 2 (Red)    Row Both;10 reps    Theraband Level (Shoulder Row) Level 2 (Red)      Shoulder Exercises: ROM/Strengthening   Pushups Limitations 2 x 10 repetitions at wall                  PT Education - 09/24/20 1307    Education Details Access Code: [email or web address]            PT Short Term Goals - 09/10/20 1705      PT SHORT TERM GOAL #1   Title Patient will be independent with HEP for continued progression at home.    Time 6    Period Weeks    Status New    Target Date 10/22/20      PT SHORT TERM GOAL #2   Title Patient will improve shoulder flexion to 120 degrees bilaterally for improved overhead reaching.    Baseline Lt 102; Rt 95    Time 6    Period Weeks    Status New    Target Date 10/22/20      PT SHORT TERM GOAL #3   Title Patient will report no more than 4/10 pain for two consecutive sessions to indicate improved UE activity tolerance to more readily complete ADLs    Time 6    Period Weeks    Status  New    Target Date 10/22/20             PT Long Term Goals - 09/10/20 1707      PT LONG TERM GOAL #1   Title Patient will be independent with advanced HEP for long term management of symptoms post D/C.    Time 12    Period Weeks    Status New    Target Date 12/03/20      PT LONG TERM GOAL #2   Title FOTO goal    Time 12    Period Weeks    Status New    Target Date 12/03/20      PT LONG TERM GOAL #3   Title Patient will demonstrate 140 degrees flexion bilaterally for improved overhead reaching    Time 12    Period Weeks    Status New    Target Date 12/03/20      PT LONG TERM GOAL #4   Title Patient will raise/lower 4# from waist to overhead height without increased pain for improved ability to complete ADLs.    Time 12    Period Weeks    Status New    Target Date 12/03/20                 Plan - 09/24/20 1307    Clinical Impression Statement Patient reports continued general discomfort but able to perform all exercises and activities without pain. Requiring tactile cuing for transverse abdominus activation. Tactile and verbal cues provided for proper hip hinge to perform dead lift activity. Would benefit from continued skilled intervention for improved activity tolerance and decreased pain.    Personal Factors and Comorbidities Comorbidity 3+    Comorbidities arthritis, cancer, depression    Examination-Activity Limitations Carry;Caring for Others;Reach Overhead;Sleep    Examination-Participation Restrictions Meal Prep;Cleaning;Laundry    Rehab Potential Good    PT Frequency 2x / week    PT Duration 12  weeks    PT Treatment/Interventions ADLs/Self Care Home Management;Cryotherapy;Electrical Stimulation;Iontophoresis 4mg /ml Dexamethasone;Moist Heat;Traction;Therapeutic activities;Therapeutic exercise;Neuromuscular re-education;Patient/family education;Manual techniques;Passive range of motion;Dry needling;Taping;Spinal Manipulations;Joint Manipulations    PT Next  Visit Plan continue bil LE and scapular strengthening; progress core strengthening as tolerated    PT Home Exercise Plan Access Code: [email or web address]    Consulted and Agree with Plan of Care Patient           Patient will benefit from skilled therapeutic intervention in order to improve the following deficits and impairments:  Decreased activity tolerance,Decreased range of motion,Decreased strength,Hypomobility,Increased muscle spasms,Impaired UE functional use,Postural dysfunction,Improper body mechanics,Pain  Visit Diagnosis: Cramp and spasm  Chronic left shoulder pain  Chronic right shoulder pain  Pain in left hip  Pain in right hip     Problem List Patient Active Problem List   Diagnosis Date Noted  . Pre-diabetes 08/27/2020  . Chronic pain of both shoulders 05/05/2018  . Vitamin D deficiency 05/05/2018  . Other fatigue 01/02/2018  . Allergic rhinitis 01/02/2018  . Weight gain 01/02/2018  . PVC (premature ventricular contraction) 03/09/2017  . Routine general medical examination at a health care facility 09/01/2015  . Hyperlipidemia 09/01/2015  . Varicose vein 08/27/2014  . Primary localized osteoarthrosis, lower leg 10/30/2013  . Anxiety state 03/23/2013  . 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 Depression 01/01/2013  . Abnormal stress echo 12/11/2012  . Palpitations 09/14/2010    Everardo All PT, DPT  09/24/20 1:11 PM    [facility] [address] [place], [place], [address] Phone: [phone]   Fax:  [phone]  Name: Jean Smith MRN: [id number] Date of Birth: [birth year]

## 2020-09-24 NOTE — Progress Notes (Signed)
Subjective:    Patient ID: Jean Smith, female    DOB: June 05, 1951, 70 y.o.   MRN: [id number]  HPI The patient is here for an acute visit.   Cc: BP issues, lightheadedness   She recently started PT and after sitting all year that has been a big change for her.  She is also trying to cut down on sugar.  She drinks hot tea in the morning and decaf tea during the day.    She has a h/o NSVT.   The past 4- 5 days ---  She has had palpitations frequently and usually she does not feel anything with them.  Recently she started to feel lightheaded with it.  The palpitations are intermittent.  Occasionally it feels like her heart stops.    She checked her BP due to the lightheadedness and her BP was 90/56 at the lowest - one hr later - 106/56, one hr later 96/64.  Her HR was always normal, but she understands it may not be an accurate measure if she is having palps.   Usually in the past if she exercises more everything improves.     Reviewed last Echo, notes from Dr Meda Coffee and Dr Caryl Comes.    Medications and allergies reviewed with patient and updated if appropriate.  Patient Active Problem List   Diagnosis Date Noted  . Pre-diabetes 08/27/2020  . Chronic pain of both shoulders 05/05/2018  . Vitamin D deficiency 05/05/2018  . Other fatigue 01/02/2018  . Allergic rhinitis 01/02/2018  . Weight gain 01/02/2018  . PVC (premature ventricular contraction) 03/09/2017  . Routine general medical examination at a health care facility 09/01/2015  . Hyperlipidemia 09/01/2015  . Varicose vein 08/27/2014  . Primary localized osteoarthrosis, lower leg 10/30/2013  . Anxiety state 03/23/2013  . 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 Depression 01/01/2013  . Abnormal stress echo 12/11/2012  . Palpitations 09/14/2010    Current Outpatient Medications on File Prior to Visit  Medication Sig Dispense Refill  . ALPRAZolam (XANAX) 0.25 MG tablet Take 1 tablet (0.25 mg total) by mouth daily  as needed for anxiety. 90 tablet 1  . b complex vitamins tablet Take 1 tablet by mouth daily.    . Cholecalciferol (VITAMIN D3) 50 MCG (2000 UT) TABS Take 1,000 mg by mouth daily.     Marland Kitchen ibuprofen (ADVIL,MOTRIN) 200 MG tablet Take 200 mg by mouth 2 (two) times daily as needed for fever or headache.     . loratadine (CLARITIN) 10 MG tablet Take 10 mg by mouth daily as needed for allergies.    Marland Kitchen MAGNESIUM GLUCONATE PO Take 200 mg by mouth 2 (two) times daily.     . propranolol (INDERAL) 10 MG tablet Take 1 tablet (10 mg total) by mouth as needed. 90 tablet 3  . vitamin B-12 (CYANOCOBALAMIN) 1000 MCG tablet Take 1,000 mcg by mouth daily.    Marland Kitchen co-enzyme Q-10 30 MG capsule Take 30 mg by mouth 3 (three) times daily. (Patient not taking: Reported on 09/25/2020)     No current facility-administered medications on file prior to visit.    Past Medical History:  Diagnosis Date  . Allergy   . Anxiety   . Arthritis   . Cancer ([place])    skin  . Cataract    bilateral - MD is just watching   . Diverticulosis   . Fuchs' endothelial dystrophy    follows with optho regularly   . Gestational diabetes   . Heart  murmur    MVP   . History of depression   . HSV-2 infection   . Hyperlipidemia    no meds - diet controlled  . Hyperplastic colon polyp   . Mitral valve prolapse   . PVC's (premature ventricular contractions)    intol of BB, sxc palpitations r/t stress  . RVOT ventricular tachycardia/PVCs    EP eval 01/2013 for freq PVCs    Past Surgical History:  Procedure Laterality Date  . CESAREAN SECTION     x 3  . COLONOSCOPY    . KNEE SURGERY Right   . MANDIBLE SURGERY     right side in front of ear  . MOHS SURGERY  2021  . POLYPECTOMY    . WISDOM TOOTH EXTRACTION      Social History   Socioeconomic History  . Marital status: Married    Spouse name: Not on file  . Number of children: 3  . Years of education: Not on file  . Highest education level: Not on file  Occupational History   . Not on file  Tobacco Use  . Smoking status: Former Smoker    Types: Cigarettes    Quit date: 06/15/1983    Years since quitting: 37.3  . Smokeless tobacco: Never Used  Vaping Use  . Vaping Use: Never used  Substance and Sexual Activity  . Alcohol use: Not Currently    Comment: occasional  . Drug use: No  . Sexual activity: Not Currently    Birth control/protection: Post-menopausal  Other Topics Concern  . Not on file  Social History Narrative   Regular exercise: yes   Caffeine use: none   Social Determinants of Health   Financial Resource Strain: Not on file  Food Insecurity: Not on file  Transportation Needs: Not on file  Physical Activity: Not on file  Stress: Not on file  Social Connections: Not on file    Family History  Problem Relation Age of Onset  . Colon cancer Mother        dx'd in her 66's-- stage 2   . Heart disease Father   . Colon cancer Maternal Grandmother   . Colon polyps Sister   . Colon polyps Sister   . Alcohol abuse Other   . Rectal cancer Neg Hx   . Stomach cancer Neg Hx   . Esophageal cancer Neg Hx     Review of Systems  Constitutional: Negative for fever.  HENT: Negative for congestion, sinus pain and sore throat.   Respiratory: Negative for cough, shortness of breath and wheezing.   Cardiovascular: Positive for palpitations. Negative for chest pain and leg swelling.  Neurological: Positive for light-headedness and headaches (today). Negative for dizziness.       Objective:   Vitals:   09/25/20 0959  BP: 120/84  Pulse: 81  Temp: 98.3 F (36.8 C)  SpO2: 98%   BP Readings from Last 3 Encounters:  09/25/20 120/84  08/25/20 118/80  05/28/20 138/80   Wt Readings from Last 3 Encounters:  09/25/20 168 lb 6.4 oz (76.4 kg)  08/25/20 166 lb 3.2 oz (75.4 kg)  05/28/20 167 lb 6.4 oz (75.9 kg)   Body mass index is 28.91 kg/m.   Physical Exam    Constitutional: Appears well-developed and well-nourished. No distress.  Head:  Normocephalic and atraumatic.  Neck: Neck supple. No tracheal deviation present. No thyromegaly present.  No cervical lymphadenopathy Cardiovascular: Normal rate, regular rhythm and normal heart sounds.  No murmur heard. No  carotid bruit .  No edema Pulmonary/Chest: Effort normal and breath sounds normal. No respiratory distress. No has no wheezes. No rales.  Skin: Skin is warm and dry. Not diaphoretic.  Psychiatric: Normal mood and affect. Behavior is normal.       Assessment & Plan:    I spent 20 minutes dedicated to the care of this patient on the date of this encounter including review of recent labs, imaging and procedures, speciality notes, obtaining history, communicating with the patient, ordering medications, tests, and documenting clinical information in the EHR   See Problem List for Assessment and Plan of chronic medical problems.    This visit occurred during the SARS-CoV-2 public health emergency.  Safety protocols were in place, including screening questions prior to the visit, additional usage of staff PPE, and extensive cleaning of exam room while observing appropriate contact time as indicated for disinfecting solutions.

## 2020-09-25 ENCOUNTER — Ambulatory Visit (INDEPENDENT_AMBULATORY_CARE_PROVIDER_SITE_OTHER): Payer: PPO | Admitting: Internal Medicine

## 2020-09-25 ENCOUNTER — Encounter: Payer: Self-pay | Admitting: Internal Medicine

## 2020-09-25 DIAGNOSIS — I472 Ventricular tachycardia: Secondary | ICD-10-CM | POA: Diagnosis not present

## 2020-09-25 DIAGNOSIS — I4729 Other ventricular tachycardia: Secondary | ICD-10-CM | POA: Insufficient documentation

## 2020-09-25 NOTE — Patient Instructions (Addendum)
  Start monitoring your BP and heart rate daily and keep a log.   Medications changes include :   None - take the propranolol daily as needed.   Follow up with Cardiology.

## 2020-09-25 NOTE — Assessment & Plan Note (Signed)
Chronic Intermittent Following with cardiology and has see Dr Caryl Comes in the past Her current symptoms are likely related to this She was afraid to take the propranolol due to her low BP  Advised to take the propranolol prn unless BP is very low Discussed the lightheadedness and low BP are likely related to the NSVT, but stressed she needs to have the tests requested by cardiology to rule out other causes and evaluate her heart more BP today ok  - take propranolol if needed Schedule an appointment with new cardiologist She will call with questions, worsening of symptoms prior to seeing cardiology if needed

## 2020-09-29 ENCOUNTER — Ambulatory Visit: Payer: PPO | Admitting: Cardiology

## 2020-09-29 ENCOUNTER — Other Ambulatory Visit: Payer: Self-pay

## 2020-09-29 ENCOUNTER — Encounter: Payer: Self-pay | Admitting: Cardiology

## 2020-09-29 VITALS — BP 126/68 | HR 88 | Ht 64.0 in | Wt 170.4 lb

## 2020-09-29 DIAGNOSIS — I472 Ventricular tachycardia: Secondary | ICD-10-CM

## 2020-09-29 DIAGNOSIS — Z8249 Family history of ischemic heart disease and other diseases of the circulatory system: Secondary | ICD-10-CM

## 2020-09-29 DIAGNOSIS — I4729 Other ventricular tachycardia: Secondary | ICD-10-CM

## 2020-09-29 DIAGNOSIS — R002 Palpitations: Secondary | ICD-10-CM | POA: Diagnosis not present

## 2020-09-29 DIAGNOSIS — R011 Cardiac murmur, unspecified: Secondary | ICD-10-CM | POA: Diagnosis not present

## 2020-09-29 MED ORDER — METOPROLOL TARTRATE 100 MG PO TABS: ORAL_TABLET | ORAL | 0 refills | Status: DC

## 2020-09-29 NOTE — Progress Notes (Signed)
Cardiology Office Note    Date:  09/29/2020   ID:  Jean Smith, DOB 11/30/1950, MRN [id number]  PCP:  Hoyt Koch, MD  Cardiologist: Dr. Radford Pax   Chief Complaint: CP  History of Present Illness:   Jean Smith is a 70 y.o. 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  She was last seen by Dr. Meda Smith in July 2020.  She recommended proceeding with coronary CTA to evaluate for ischemia.  Patient agreed but then did not show up for her appointment as she was dealing with significant stressors in her her family including traumatic brain injury of her son who is now recovering.  She reports that she is afraid to attend the appointment. She remains his primary caregiver. Patient had COVID 06/2019. Got Pfizer vaccine 08/04/19. Echo 07/23/19: LVEF of 55-60%, grade 1 DD, mild MR, mild AI  She has now episodes of palpitations that seems to be exertional as well as shortness of breath, pain seems atypical in her neck and back, she denies any dizziness or syncope. She was previously scheduled for coronary CT but canceled. This past week she has felt lightheadedness and she suspects that it relates to her heart. She also has been outside more often than usual and thinks it could be seasonal allergies. She has stopped taking her B vitamin supplements 48 hrs ago, she thinks she could be having side effects. She has some lower exetremties weakness, but she is attending physical therapy . She gets SOB if she walks distances, but denies any exertional chest pain. She drinks 1 tea bag and decaf all day long. She has been eating more sugar lately.  Her cholesterol has been significantly elevated in the past but got worse over this last year. She smoked 45 years ago. Her father had CAD hx and MI in the past. Her mother  had A-fib in the past and sleep apnea.    Past Medical History:  Diagnosis Date  . Allergy   . Anxiety   . Arthritis   . Cancer ([place])    skin  . Cataract    bilateral - MD is just watching   . Diverticulosis   . Fuchs' endothelial dystrophy    follows with optho regularly   . Gestational diabetes   . Heart murmur    MVP   . History of depression   . HSV-2 infection   . Hyperlipidemia    no meds - diet controlled  . Hyperplastic colon polyp   . Mitral valve prolapse   . PVC's (premature ventricular contractions)    intol of BB, sxc palpitations r/t stress  . RVOT ventricular tachycardia/PVCs    EP eval 01/2013 for freq PVCs   Past Surgical History:  Procedure Laterality Date  . CESAREAN SECTION     x 3  . COLONOSCOPY    . KNEE SURGERY Right   . MANDIBLE SURGERY     right side in front of ear  . MOHS SURGERY  2021  . POLYPECTOMY    . WISDOM TOOTH EXTRACTION     Current Medications: Prior to Admission medications   Medication Sig Start Date End Date Taking? Authorizing Provider  ALPRAZolam (XANAX) 0.25 MG tablet TAKE 1 TABLET BY MOUTH THREE TIMES A DAY Patient taking differently: Take 0.25 mg  by mouth daily as needed for anxiety.  04/19/19   Hoyt Koch, MD  b complex vitamins tablet Take 1 tablet by mouth daily.    [provider]  Cholecalciferol (VITAMIN D3) 50 MCG (2000 UT) TABS Take 2,000 mg by mouth daily.    [provider]  ibuprofen (ADVIL,MOTRIN) 200 MG tablet Take 200 mg by mouth 2 (two) times daily as needed for fever or headache.     [provider]  loratadine (CLARITIN) 10 MG tablet Take 10 mg by mouth daily as needed for allergies.     [provider]  MAGNESIUM GLUCONATE PO Take 400 mg by mouth 2 (two) times daily.    [provider]  propranolol (INDERAL) 10 MG tablet Take 1 tablet (10 mg total) by mouth daily. As needed for palpitations. Please make overdue appt with Dr. Acie Fredrickson. 3rd and final  attempt Patient taking differently: Take 10 mg by mouth daily as needed. As needed for palpitations. Please make overdue appt with Dr. Acie Fredrickson. 3rd and final attempt 04/13/18   Nahser, Wonda Cheng, MD   Allergies:   Atorvastatin, Crestor [rosuvastatin calcium], Latex, Lipitor [atorvastatin calcium], Other, and Pollen extract   Social History   Socioeconomic History  . Marital status: Married    Spouse name: Not on file  . Number of children: 3  . Years of education: Not on file  . Highest education level: Not on file  Occupational History  . Not on file  Tobacco Use  . Smoking status: Former Smoker    Types: Cigarettes    Quit date: 06/15/1983    Years since quitting: 37.3  . Smokeless tobacco: Never Used  Vaping Use  . Vaping Use: Never used  Substance and Sexual Activity  . Alcohol use: Not Currently    Comment: occasional  . Drug use: No  . Sexual activity: Not Currently    Birth control/protection: Post-menopausal  Other Topics Concern  . Not on file  Social History Narrative   Regular exercise: yes   Caffeine use: none   Social Determinants of Health   Financial Resource Strain: Not on file  Food Insecurity: Not on file  Transportation Needs: Not on file  Physical Activity: Not on file  Stress: Not on file  Social Connections: Not on file    Family History:  The patient's family history includes Alcohol abuse in an other family member; Colon cancer in her maternal grandmother and mother; Colon polyps in her sister and sister; Heart disease in her father.   ROS:   Please see the history of present illness.    ROS All other systems reviewed and are negative.  PHYSICAL EXAM:    VS:  BP 126/68   Pulse 88   Ht 5\' 4"  (1.626 m)   Wt 170 lb 6.4 oz (77.3 kg)   SpO2 99%   BMI 29.25 kg/m     GEN: Well nourished, well developed in no acute distress HEENT: Normal NECK: No JVD; No carotid bruits LYMPHATICS: No lymphadenopathy CARDIAC:RRR, 2/6 systolic murmur at the  left upper sternal border, no rubs, gallops RESPIRATORY:  Clear to auscultation without rales, wheezing or rhonchi  ABDOMEN: Soft, non-tender, non-distended MUSCULOSKELETAL:  No edema; No deformity  SKIN: Warm and dry NEUROLOGIC:  Alert and oriented x 3 PSYCHIATRIC:  Normal affect   Wt Readings from Last 3 Encounters:  09/29/20 170 lb 6.4 oz (77.3 kg)  09/25/20 168 lb 6.4 oz (76.4 kg)  08/25/20 166 lb 3.2  oz (75.4 kg)    Studies/Labs Reviewed:   EKG:  EKG is ordered today.The ekg ordered today demonstrates normal sinus rhythm, rate 70 bpm  Recent Labs: 05/20/2020: ALT 14; Hemoglobin 13.6; Platelets 259.0; TSH 1.30 06/27/2020: BUN 12; Creatinine, Ser 0.76; Potassium 4.2; Sodium 140   Lipid Panel    Component Value Date/Time   CHOL 259 (H) 08/25/2020 1033   CHOL 278 (H) 12/25/2018 1204   TRIG 117.0 08/25/2020 1033   HDL 65.60 08/25/2020 1033   HDL 68 12/25/2018 1204   CHOLHDL 4 08/25/2020 1033   VLDL 23.4 08/25/2020 1033   LDLCALC 170 (H) 08/25/2020 1033   LDLCALC 183 (H) 12/25/2018 1204   LDLDIRECT 168.0 01/02/2018 1459   Additional studies/ records that were reviewed today include:  TTE: 07/23/19 1. Left ventricular ejection fraction, by estimation, is 55 to 60%. The  left ventricle has normal function. There is moderately increased left  ventricular hypertrophy. Left ventricular diastolic parameters are  consistent with Grade I diastolic  dysfunction (impaired relaxation).  2. Right ventricular systolic function is normal. There is normal  pulmonary artery systolic pressure.  3. Mild mitral valve regurgitation.  4. Aortic valve regurgitation is mild.     ASSESSMENT & PLAN:   1. Chest discomfort -The patient has significant risk factors including family history of premature coronary artery disease in her father who had a myocardial infarction at age of 71, there is also, hypertension, significant mixed hyperlipidemia and physical inactivity.  -coronary CTA ordered  but she never followed through -She previously did not tolerate multiple type of statins, she might be a good candidate for PCSK9 inhibitors. -she has not really had any chest pain but a discomfort that seems to be her palpitations -I am concerned that given her fm hx of CAD, CRFs and NSVT on monitor that she may have underlying CAD -recommend proceeding with coronary CTA to define coronary anatomy  2.  Nonsustained VT/PVC -these were documented on event monitor in the past -she is now having increased palpitations mainly at night -TSH and K+ were normal 05/2020 -check mag level -repeat event monitor to make sure she is not having PAF or more VT  3. Valvular heart disease -she has a hx of mild AR and MR on echo a year ago -she has a systolic murmur in the LUSB on exam today  4.  Hyperlipidemia  -she is adamant about not taking statins -LDL was 177 in Jan 2022 and her goal is < 100 and < 70 if she has CAD -once we get the results from coronary CTA we will refer her to the lipid clinic for further management.  Medication Adjustments/Labs and Tests Ordered: Current medicines are reviewed at length with the patient today.  Concerns regarding medicines are outlined above.  Medication changes, Labs and Tests ordered today are listed in the Patient Instructions below. There are no Patient Instructions on file for this visit.    I,Alexis Bryant,acting as a Education administrator for Fransico Him, MD.,have documented all relevant documentation on the behalf of Fransico Him, MD,as directed by  Fransico Him, MD while in the presence of Fransico Him, MD.   I, Fransico Him, MD, have reviewed all documentation for this visit. The documentation on 09/29/20 for the exam, diagnosis, procedures, and orders are all accurate and complete.  Bridgett Larsson  09/29/2020 2:31 PM    [facility] [place], [place], [place]  [address] Phone: [phone]; Fax: [phone]

## 2020-09-29 NOTE — Patient Instructions (Signed)
Medication Instructions:  Your physician recommends that you continue on your current medications as directed. Please refer to the Current Medication list given to you today.  *If you need a refill on your cardiac medications before your next appointment, please call your pharmacy*   Lab Work: BMET and Magnesium prior to CT scan  If you have labs (blood work) drawn today and your tests are completely normal, you will receive your results only by: Marland Kitchen MyChart Message (if you have MyChart) OR . A paper copy in the mail If you have any lab test that is abnormal or we need to change your treatment, we will call you to review the results.   Testing/Procedures: Your physician has requested that you have an echocardiogram. Echocardiography is a painless test that uses sound waves to create images of your heart. It provides your doctor with information about the size and shape of your heart and how well your heart's chambers and valves are working. This procedure takes approximately one hour. There are no restrictions for this procedure.  Your physician has recommended that you wear an event monitor. Event monitors are medical devices that record the heart's electrical activity. Doctors most often [place] these monitors to diagnose arrhythmias. Arrhythmias are problems with the speed or rhythm of the heartbeat. The monitor is a small, portable device. You can wear one while you do your normal daily activities. This is usually used to diagnose what is causing palpitations/syncope (passing out).  Your physician has recommended that you have a coronary CTA scan. Please see below for further instructions.   Follow-Up: At [facility], you and your health needs are our priority.  As part of our continuing mission to provide you with exceptional heart care, we have created designated Provider Care Teams.  These Care Teams include your primary Cardiologist (physician) and Advanced Practice Providers (APPs -   Physician Assistants and Nurse Practitioners) who all work together to provide you with the care you need, when you need it.  Your next appointment:   1 year(s)  The format for your next appointment:   In Person  Provider:   You may see Fransico Him, MD or one of the following Advanced Practice Providers on your designated Care Team:    Melina Copa, PA-C  Ermalinda Barrios, PA-C    Other Instructions Your cardiac CT will be scheduled at one of the below locations:   [facility] [address] [place], [place] [address] [phone]  Please arrive at the [facility] main entrance (entrance A) of [facility] 30 minutes prior to test start time. Proceed to the [facility] Radiology Department (first floor) to check-in and test prep.  Please follow these instructions carefully (unless otherwise directed):  On the Night Before the Test: . Be sure to Drink plenty of water. . Do not consume any caffeinated/decaffeinated beverages or chocolate 12 hours prior to your test. . Do not take any antihistamines 12 hours prior to your test.  On the Day of the Test: . Drink plenty of water until 1 hour prior to the test. . Do not eat any food 4 hours prior to the test. . You may take your regular medications prior to the test.  . Take metoprolol (Lopressor) two hours prior to test. . HOLD Furosemide/Hydrochlorothiazide morning of the test. . FEMALES- please wear underwire-free bra if available      After the Test: . Drink plenty of water. . After receiving IV contrast, you may  experience a mild flushed feeling. This is normal. . On occasion, you may experience a mild rash up to 24 hours after the test. This is not dangerous. If this occurs, you can take Benadryl 25 mg and increase your fluid intake. . If you experience trouble breathing, this can be serious. If it is severe call 911 IMMEDIATELY. If it is mild, please call our office. . If you take any of these  medications: Glipizide/Metformin, Avandament, Glucavance, please do not take 48 hours after completing test unless otherwise instructed.   Once we have confirmed authorization from your insurance company, we will call you to set up a date and time for your test. Based on how quickly your insurance processes prior authorizations requests, please allow up to 4 weeks to be contacted for scheduling your Cardiac CT appointment. Be advised that routine Cardiac CT appointments could be scheduled as many as 8 weeks after your provider has ordered it.  For non-scheduling related questions, please contact the cardiac imaging nurse navigator should you have any questions/concerns: Marchia Bond, Cardiac Imaging Nurse Navigator Gordy Clement, Cardiac Imaging Nurse Navigator [facility] Direct Office Dial: [phone]   For scheduling needs, including cancellations and rescheduling, please call [place], [phone].

## 2020-09-29 NOTE — Progress Notes (Deleted)
Cardiology Office Note    Date:  09/29/2020   ID:  REYES FIFIELD, DOB 1950-06-24, MRN [id number]  PCP:  Hoyt Koch, MD  Cardiologist: Dr. Radford Pax   Chief Complaint: CP  History of Present Illness:   Jean Smith is a 70 y.o. 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  She was last seen by Dr. Meda Coffee in July 2020.  She recommended proceeding with coronary CTA to evaluate for ischemia.  Patient agreed but then did not show up for her appointment as she was dealing with significant stressors in her her family including traumatic brain injury of her son who is now recovering.  She remains his primary caregiver. Patient had COVID 06/2019. Got Pfizer vaccine 08/04/19. Echo 07/23/19: LVEF of 55-60%, grade 1 DD, mild MR, mild AI  She has now episodes of chest pain that seems to be exertional as well as shortness of breath, pain seems atypical in her neck and back, she denies any palpitation dizziness or syncope.  She was previously scheduled for coronary CT but canceled. Her cholesterol has been significantly elevated in the past but got worse over this last year.  Past Medical History:  Diagnosis Date  . Allergy   . Anxiety   . Arthritis   . Cancer ([place])    skin  . Cataract    bilateral - MD is just watching   . Diverticulosis   . Fuchs' endothelial dystrophy    follows with optho regularly   . Gestational diabetes   . Heart murmur    MVP   . History of depression   . HSV-2 infection   . Hyperlipidemia    no meds - diet controlled  . Hyperplastic colon polyp   . Mitral valve prolapse   . PVC's (premature ventricular contractions)    intol of BB, sxc palpitations r/t stress  . RVOT ventricular tachycardia/PVCs    EP eval 01/2013 for freq PVCs   Past Surgical History:  Procedure  Laterality Date  . CESAREAN SECTION     x 3  . COLONOSCOPY    . KNEE SURGERY Right   . MANDIBLE SURGERY     right side in front of ear  . MOHS SURGERY  2021  . POLYPECTOMY    . WISDOM TOOTH EXTRACTION     Current Medications: Prior to Admission medications   Medication Sig Start Date End Date Taking? Authorizing Provider  ALPRAZolam (XANAX) 0.25 MG tablet TAKE 1 TABLET BY MOUTH THREE TIMES A DAY Patient taking differently: Take 0.25 mg by mouth daily as needed for anxiety.  04/19/19   Hoyt Koch, MD  b complex vitamins tablet Take 1 tablet by mouth daily.    [provider]  Cholecalciferol (VITAMIN D3) 50 MCG (2000 UT) TABS Take 2,000 mg by mouth daily.    [provider]  ibuprofen (ADVIL,MOTRIN) 200 MG tablet Take 200 mg by mouth 2 (two) times daily as needed for fever or headache.     [provider]  loratadine (CLARITIN) 10 MG tablet Take 10 mg by mouth daily as needed for allergies.     [provider]  MAGNESIUM GLUCONATE PO Take 400 mg by mouth 2 (two) times daily.    [provider]  propranolol (INDERAL) 10 MG tablet  Take 1 tablet (10 mg total) by mouth daily. As needed for palpitations. Please make overdue appt with Dr. Acie Fredrickson. 3rd and final attempt Patient taking differently: Take 10 mg by mouth daily as needed. As needed for palpitations. Please make overdue appt with Dr. Acie Fredrickson. 3rd and final attempt 04/13/18   Nahser, Wonda Cheng, MD   Allergies:   Atorvastatin, Crestor [rosuvastatin calcium], Latex, Lipitor [atorvastatin calcium], Other, and Pollen extract   Social History   Socioeconomic History  . Marital status: Married    Spouse name: Not on file  . Number of children: 3  . Years of education: Not on file  . Highest education level: Not on file  Occupational History  . Not on file  Tobacco Use  . Smoking status: Former Smoker    Types: Cigarettes    Quit date: 06/15/1983    Years since quitting: 37.3  .  Smokeless tobacco: Never Used  Vaping Use  . Vaping Use: Never used  Substance and Sexual Activity  . Alcohol use: Not Currently    Comment: occasional  . Drug use: No  . Sexual activity: Not Currently    Birth control/protection: Post-menopausal  Other Topics Concern  . Not on file  Social History Narrative   Regular exercise: yes   Caffeine use: none   Social Determinants of Health   Financial Resource Strain: Not on file  Food Insecurity: Not on file  Transportation Needs: Not on file  Physical Activity: Not on file  Stress: Not on file  Social Connections: Not on file    Family History:  The patient's family history includes Alcohol abuse in an other family member; Colon cancer in her maternal grandmother and mother; Colon polyps in her sister and sister; Heart disease in her father.   ROS:   Please see the history of present illness.    ROS All other systems reviewed and are negative.  PHYSICAL EXAM:    VS:  BP 126/68   Pulse 88   Ht 5\' 4"  (1.626 m)   Wt 170 lb 6.4 oz (77.3 kg)   SpO2 99%   BMI 29.25 kg/m    GEN: Well nourished, well developed in no acute distress HEENT: Normal NECK: No JVD; No carotid bruits LYMPHATICS: No lymphadenopathy CARDIAC:RRR, no murmurs, rubs, gallops RESPIRATORY:  Clear to auscultation without rales, wheezing or rhonchi  ABDOMEN: Soft, non-tender, non-distended MUSCULOSKELETAL:  No edema; No deformity  SKIN: Warm and dry NEUROLOGIC:  Alert and oriented x 3 PSYCHIATRIC:  Normal affect   Wt Readings from Last 3 Encounters:  09/29/20 170 lb 6.4 oz (77.3 kg)  09/25/20 168 lb 6.4 oz (76.4 kg)  08/25/20 166 lb 3.2 oz (75.4 kg)    Studies/Labs Reviewed:   EKG:  EKG is ordered today.  The ekg ordered today demonstrates***  Recent Labs: 05/20/2020: ALT 14; Hemoglobin 13.6; Platelets 259.0; TSH 1.30 06/27/2020: BUN 12; Creatinine, Ser 0.76; Potassium 4.2; Sodium 140   Lipid Panel    Component Value Date/Time   CHOL 259 (H)  08/25/2020 1033   CHOL 278 (H) 12/25/2018 1204   TRIG 117.0 08/25/2020 1033   HDL 65.60 08/25/2020 1033   HDL 68 12/25/2018 1204   CHOLHDL 4 08/25/2020 1033   VLDL 23.4 08/25/2020 1033   LDLCALC 170 (H) 08/25/2020 1033   LDLCALC 183 (H) 12/25/2018 1204   LDLDIRECT 168.0 01/02/2018 1459   Additional studies/ records that were reviewed today include:  TTE: 07/23/19 1. Left ventricular ejection fraction, by estimation,  is 55 to 60%. The  left ventricle has normal function. There is moderately increased left  ventricular hypertrophy. Left ventricular diastolic parameters are  consistent with Grade I diastolic  dysfunction (impaired relaxation).  2. Right ventricular systolic function is normal. There is normal  pulmonary artery systolic pressure.  3. Mild mitral valve regurgitation.  4. Aortic valve regurgitation is mild.     ASSESSMENT & PLAN:   1. Chest discomfort -The patient has significant risk factors including family history of premature coronary artery disease in her father who had a myocardial infarction at age of 41, there is also, hypertension, significant mixed hyperlipidemia and physical inactivity.  -coronary CTA ordered but she never followed through -She previously did not tolerate multiple type of statins, she might be a good candidate for PCSK9 inhibitors.  2.  Nonsustained VT/PVC -I have at length discussion regarding coronary CT in 2 weeks.  Previously she has canceled secondary to family issue.  Discussed risks of sudden cardiac death or arrhythmia of untreated CAD. Advised to continue ASA 81mg  qd. Hx of statin intolerance.   3. Valvular heart disease - Continue to follow   4.  Hyperlipidemia -she is adamant about not taking statins, once we get the results from coronary CTA we will refer her to the lipid clinic for further management.  Medication Adjustments/Labs and Tests Ordered: Current medicines are reviewed at length with the patient today.  Concerns  regarding medicines are outlined above.  Medication changes, Labs and Tests ordered today are listed in the Patient Instructions below. Patient Instructions  Medication Instructions:  Your physician recommends that you continue on your current medications as directed. Please refer to the Current Medication list given to you today.  *If you need a refill on your cardiac medications before your next appointment, please call your pharmacy*   Lab Work: BMET and Magnesium prior to CT scan  If you have labs (blood work) drawn today and your tests are completely normal, you will receive your results only by: Marland Kitchen MyChart Message (if you have MyChart) OR . A paper copy in the mail If you have any lab test that is abnormal or we need to change your treatment, we will call you to review the results.   Testing/Procedures: Your physician has requested that you have an echocardiogram. Echocardiography is a painless test that uses sound waves to create images of your heart. It provides your doctor with information about the size and shape of your heart and how well your heart's chambers and valves are working. This procedure takes approximately one hour. There are no restrictions for this procedure.  Your physician has recommended that you wear an event monitor. Event monitors are medical devices that record the heart's electrical activity. Doctors most often [place] these monitors to diagnose arrhythmias. Arrhythmias are problems with the speed or rhythm of the heartbeat. The monitor is a small, portable device. You can wear one while you do your normal daily activities. This is usually used to diagnose what is causing palpitations/syncope (passing out).  Your physician has recommended that you have a coronary CTA scan. Please see below for further instructions.   Follow-Up: At [facility], you and your health needs are our priority.  As part of our continuing mission to provide you with exceptional heart  care, we have created designated Provider Care Teams.  These Care Teams include your primary Cardiologist (physician) and Advanced Practice Providers (APPs -  Physician Assistants and Nurse Practitioners) who all work together to  provide you with the care you need, when you need it.  Your next appointment:   1 year(s)  The format for your next appointment:   In Person  Provider:   You may see Fransico Him, MD or one of the following Advanced Practice Providers on your designated Care Team:    Melina Copa, PA-C  Ermalinda Barrios, PA-C    Other Instructions Your cardiac CT will be scheduled at one of the below locations:   [facility] [address] [place], [place] [address] [phone]  Please arrive at the [facility] main entrance (entrance A) of [facility] 30 minutes prior to test start time. Proceed to the [facility] Radiology Department (first floor) to check-in and test prep.  Please follow these instructions carefully (unless otherwise directed):  On the Night Before the Test: . Be sure to Drink plenty of water. . Do not consume any caffeinated/decaffeinated beverages or chocolate 12 hours prior to your test. . Do not take any antihistamines 12 hours prior to your test.  On the Day of the Test: . Drink plenty of water until 1 hour prior to the test. . Do not eat any food 4 hours prior to the test. . You may take your regular medications prior to the test.  . Take metoprolol (Lopressor) two hours prior to test. . HOLD Furosemide/Hydrochlorothiazide morning of the test. . FEMALES- please wear underwire-free bra if available      After the Test: . Drink plenty of water. . After receiving IV contrast, you may experience a mild flushed feeling. This is normal. . On occasion, you may experience a mild rash up to 24 hours after the test. This is not dangerous. If this occurs, you can take Benadryl 25 mg and increase your fluid intake. . If you  experience trouble breathing, this can be serious. If it is severe call 911 IMMEDIATELY. If it is mild, please call our office. . If you take any of these medications: Glipizide/Metformin, Avandament, Glucavance, please do not take 48 hours after completing test unless otherwise instructed.   Once we have confirmed authorization from your insurance company, we will call you to set up a date and time for your test. Based on how quickly your insurance processes prior authorizations requests, please allow up to 4 weeks to be contacted for scheduling your Cardiac CT appointment. Be advised that routine Cardiac CT appointments could be scheduled as many as 8 weeks after your provider has ordered it.  For non-scheduling related questions, please contact the cardiac imaging nurse navigator should you have any questions/concerns: Marchia Bond, Cardiac Imaging Nurse Navigator Gordy Clement, Cardiac Imaging Nurse Navigator [facility] Direct Office Dial: [phone]   For scheduling needs, including cancellations and rescheduling, please call [place], [phone].       Signed, Fransico Him, MD  09/29/2020 3:20 PM    [facility] [place], [place], [place]  [address] Phone: [phone]; Fax: [phone]

## 2020-09-30 ENCOUNTER — Encounter: Payer: Self-pay | Admitting: Physical Therapy

## 2020-09-30 ENCOUNTER — Ambulatory Visit: Payer: PPO | Admitting: Physical Therapy

## 2020-09-30 DIAGNOSIS — M25511 Pain in right shoulder: Secondary | ICD-10-CM

## 2020-09-30 DIAGNOSIS — M25552 Pain in left hip: Secondary | ICD-10-CM

## 2020-09-30 DIAGNOSIS — M25512 Pain in left shoulder: Secondary | ICD-10-CM

## 2020-09-30 DIAGNOSIS — R252 Cramp and spasm: Secondary | ICD-10-CM | POA: Diagnosis not present

## 2020-09-30 DIAGNOSIS — M25551 Pain in right hip: Secondary | ICD-10-CM

## 2020-09-30 DIAGNOSIS — G8929 Other chronic pain: Secondary | ICD-10-CM

## 2020-09-30 NOTE — Therapy (Signed)
[facility] [address] [place], [place], [address] Phone: [phone]   Fax:  [phone]  Physical Therapy Treatment  Patient Details  Name: Jean Smith MRN: [id number] Date of Birth: 04-Mar-1951 Referring Provider (PT): Pricilla Holm, MD   Encounter Date: 09/30/2020   PT End of Session - 09/30/20 1206    Visit Number 5    Number of Visits 24    Date for PT Re-Evaluation 12/03/20    Authorization Type [employer]    Progress Note Due on Visit 10    PT Start Time 1021    PT Stop Time 1100    PT Time Calculation (min) 39 min    Activity Tolerance Patient tolerated treatment well    Behavior During Therapy [facility] for tasks assessed/performed           Past Medical History:  Diagnosis Date  . Allergy   . Anxiety   . Arthritis   . Cancer ([place])    skin  . Cataract    bilateral - MD is just watching   . Diverticulosis   . Fuchs' endothelial dystrophy    follows with optho regularly   . Gestational diabetes   . Heart murmur    MVP   . History of depression   . HSV-2 infection   . Hyperlipidemia    no meds - diet controlled  . Hyperplastic colon polyp   . Mitral valve prolapse   . PVC's (premature ventricular contractions)    intol of BB, sxc palpitations r/t stress  . RVOT ventricular tachycardia/PVCs    EP eval 01/2013 for freq PVCs    Past Surgical History:  Procedure Laterality Date  . CESAREAN SECTION     x 3  . COLONOSCOPY    . KNEE SURGERY Right   . MANDIBLE SURGERY     right side in front of ear  . MOHS SURGERY  2021  . POLYPECTOMY    . WISDOM TOOTH EXTRACTION      There were no vitals filed for this visit.   Subjective Assessment - 09/30/20 1025    Subjective "I have some pain in all the same spots by my head is the main thing today. I think it's just allergies."    Pertinent History arthritis, history of cancer, depression    Limitations House hold activities     How long can you sit comfortably? unlimited    How long can you stand comfortably? unlimited    How long can you walk comfortably? unlimited    Diagnostic tests None    Currently in Pain? Yes    Pain Score 4     Pain Location Head    Pain Descriptors / Indicators Headache    Pain Type Acute pain    Pain Onset Today    Pain Frequency Occasional                             [facility] Adult PT Treatment/Exercise - 09/30/20 0001      Transfers   Five time sit to stand comments  22s      Knee/Hip Exercises: Seated   Sit to Sand 2 sets;5 reps   black foam x2     Knee/Hip Exercises: Supine   Other Supine Knee/Hip Exercises pelvic tilt 5 x 5s hold    Other Supine Knee/Hip Exercises ab set with march 2 x 5 reps bil LE  Knee/Hip Exercises: Sidelying   Hip ABduction Both;2 sets;10 reps    Hip ABduction Limitations verbal and tactile cues for decreased hip flexion bilaterally      Shoulder Exercises: Standing   Extension Strengthening;Both    Theraband Level (Shoulder Extension) Level 2 (Red)    Extension Limitations 2 x 10 reps    Row Both    Theraband Level (Shoulder Row) Level 3 (Green)    Row Limitations 2 x 10 reps                    PT Short Term Goals - 09/30/20 1028      PT SHORT TERM GOAL #2   Title Patient will improve shoulder flexion to 120 degrees bilaterally for improved overhead reaching.    Baseline Lt 102; Rt 95    Time 6    Period Weeks    Status Achieved   Lt 122; Rt 126   Target Date 10/22/20             PT Long Term Goals - 09/30/20 1204      PT LONG TERM GOAL #5   Title Patient will complete five times sit to stand in <13s to indicate decreased fall risk    Baseline 22s    Time 9    Period Weeks    Status New    Target Date 12/03/20                 Plan - 09/30/20 1200    Clinical Impression Statement Patient completing five times sit to stand in 22 sec this date indicating increased fall risk due to LE  muscular endurance impairments. Patient continues to demonstrate increased negative self talk. Therapist providing max encouragement to patient with regards to improvement and progression towards goals. Bilateral UE AROM improved as patient demonstrating active shoulder flexion >120 degrees bilaterally this date. Verbal cuing for decreased scapular elevation when performing row activity. Would benefit from continued skilled intervention to address impairments for improved functional mobility and decreased fall risk.    Personal Factors and Comorbidities Comorbidity 3+    Comorbidities arthritis, cancer, depression    Examination-Activity Limitations Carry;Caring for Others;Reach Overhead;Sleep    Examination-Participation Restrictions Meal Prep;Cleaning;Laundry    Rehab Potential Good    PT Frequency 2x / week    PT Duration 12 weeks    PT Treatment/Interventions ADLs/Self Care Home Management;Cryotherapy;Electrical Stimulation;Iontophoresis 4mg /ml Dexamethasone;Moist Heat;Traction;Therapeutic activities;Therapeutic exercise;Neuromuscular re-education;Patient/family education;Manual techniques;Passive range of motion;Dry needling;Taping;Spinal Manipulations;Joint Manipulations;Functional mobility training;Gait training;Balance training    PT Next Visit Plan progress LE and core strengthening as tolerated and adding functional component when appropriate; continue scapular strengthening    PT Home Exercise Plan Access Code: [email or web address]    Consulted and Agree with Plan of Care Patient           Patient will benefit from skilled therapeutic intervention in order to improve the following deficits and impairments:  Decreased activity tolerance,Decreased range of motion,Decreased strength,Hypomobility,Increased muscle spasms,Impaired UE functional use,Postural dysfunction,Improper body mechanics,Pain  Visit Diagnosis: Cramp and spasm  Chronic left shoulder pain  Chronic right shoulder pain  Pain in  left hip  Pain in right hip     Problem List Patient Active Problem List   Diagnosis Date Noted  . NSVT (nonsustained ventricular tachycardia) ([place]) 09/25/2020  . Pre-diabetes 08/27/2020  . Chronic pain of both shoulders 05/05/2018  . Vitamin D deficiency 05/05/2018  . Other fatigue 01/02/2018  . Allergic rhinitis 01/02/2018  . Weight  gain 01/02/2018  . PVC (premature ventricular contraction) 03/09/2017  . Routine general medical examination at a health care facility 09/01/2015  . Hyperlipidemia 09/01/2015  . Varicose vein 08/27/2014  . Primary localized osteoarthrosis, lower leg 10/30/2013  . Anxiety state 03/23/2013  . 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 Depression 01/01/2013  . Abnormal stress echo 12/11/2012  . Palpitations 09/14/2010   Everardo All PT, DPT  09/30/20 12:10 PM   [facility] [address], [place], [place], [address] Phone: [phone]   Fax:  [phone]  Name: Jean Smith MRN: [id number] Date of Birth: [birth year]

## 2020-09-30 NOTE — Addendum Note (Signed)
Addended by: Antonieta Iba on: 09/30/2020 01:31 PM   Modules accepted: Orders

## 2020-10-02 ENCOUNTER — Encounter: Payer: PPO | Admitting: Physical Therapy

## 2020-10-07 ENCOUNTER — Other Ambulatory Visit: Payer: Self-pay

## 2020-10-07 ENCOUNTER — Ambulatory Visit: Payer: PPO | Admitting: Physical Therapy

## 2020-10-07 ENCOUNTER — Encounter: Payer: Self-pay | Admitting: Physical Therapy

## 2020-10-07 DIAGNOSIS — M25552 Pain in left hip: Secondary | ICD-10-CM

## 2020-10-07 DIAGNOSIS — G8929 Other chronic pain: Secondary | ICD-10-CM

## 2020-10-07 DIAGNOSIS — R252 Cramp and spasm: Secondary | ICD-10-CM | POA: Diagnosis not present

## 2020-10-07 DIAGNOSIS — M25511 Pain in right shoulder: Secondary | ICD-10-CM

## 2020-10-07 DIAGNOSIS — M25551 Pain in right hip: Secondary | ICD-10-CM

## 2020-10-07 NOTE — Therapy (Signed)
[facility] [address], [place], [place], [address] Phone: [phone]   Fax:  [phone]  Physical Therapy Treatment  Patient Details  Name: Jean Smith MRN: [id number] Date of Birth: [birth year] Referring Provider (PT): Pricilla Holm, MD   Encounter Date: 10/07/2020   PT End of Session - 10/07/20 1322    Visit Number 6    Number of Visits 24    Date for PT Re-Evaluation 12/03/20    Authorization Type [employer]    Progress Note Due on Visit 10    PT Start Time 1106   Patient arriving late   PT Stop Time 1145    PT Time Calculation (min) 39 min    Activity Tolerance Patient tolerated treatment well    Behavior During Therapy [facility] for tasks assessed/performed           Past Medical History:  Diagnosis Date  . Allergy   . Anxiety   . Arthritis   . Cancer ([place])    skin  . Cataract    bilateral - MD is just watching   . Diverticulosis   . Fuchs' endothelial dystrophy    follows with optho regularly   . Gestational diabetes   . Heart murmur    MVP   . History of depression   . HSV-2 infection   . Hyperlipidemia    no meds - diet controlled  . Hyperplastic colon polyp   . Mitral valve prolapse   . PVC's (premature ventricular contractions)    intol of BB, sxc palpitations r/t stress  . RVOT ventricular tachycardia/PVCs    EP eval 01/2013 for freq PVCs    Past Surgical History:  Procedure Laterality Date  . CESAREAN SECTION     x 3  . COLONOSCOPY    . KNEE SURGERY Right   . MANDIBLE SURGERY     right side in front of ear  . MOHS SURGERY  2021  . POLYPECTOMY    . WISDOM TOOTH EXTRACTION      There were no vitals filed for this visit.   Subjective Assessment - 10/07/20 1109    Subjective Has not started wearing heart monitor yet as she cannot figure out how to assemble it. States that she went to a family gathering this weekend and had increased back and abdominal soreness  with prolonged standing. Feels that this is due to inactivity. Initially reporting that she feels "about the same". Later stating that she continues to notice gross improvement though unable to specify in what aspects she notices improvement.    Pertinent History arthritis, history of cancer, depression    Limitations House hold activities    How long can you sit comfortably? unlimited    How long can you stand comfortably? unlimited    How long can you walk comfortably? unlimited    Diagnostic tests None    Currently in Pain? Yes    Pain Score 3     Pain Location Shoulder    Pain Orientation Right;Left    Pain Descriptors / Indicators Sore;Tightness    Pain Type Chronic pain    Pain Onset More than a month ago    Pain Frequency Constant    Pain Score 4    Pain Location Hip    Pain Orientation Right    Pain Descriptors / Indicators Aching;Sore;Tightness    Pain Type Chronic pain    Pain Radiating Towards knee    Pain Onset More than  a month ago    Pain Frequency Constant                             [facility] Adult PT Treatment/Exercise - 10/07/20 0001      Lumbar Exercises: [place] tower;Both    Row Limitations 20#; 2x10 reps; seated on sball      Lumbar Exercises: Seated   Other Seated Lumbar Exercises foam roll push 2x10 reps x 3s hold    Other Seated Lumbar Exercises pelvic tilt on sball x 10 repetitions      Knee/Hip Exercises: Aerobic   Recumbent Bike level 3 x 4 minutes      Knee/Hip Exercises: Standing   Other Standing Knee Exercises dead lift from red wedge on floor; 10# x 10 repetitions      Knee/Hip Exercises: Seated   Sit to Sand 3 sets;5 reps   yellow band at knees     Shoulder Exercises: Standing   External Rotation Both;Strengthening;10 reps;Theraband    Theraband Level (Shoulder External Rotation) Level 3 Nyoka Cowden)                    PT Short Term Goals - 10/07/20 1321      PT SHORT TERM GOAL #1   Title Patient  will be independent with HEP for continued progression at home.    Time 6    Period Weeks    Status On-going    Target Date 10/22/20      PT SHORT TERM GOAL #3   Title Patient will report no more than 4/10 pain for two consecutive sessions to indicate improved UE activity tolerance to more readily complete ADLs    Time 6    Period Weeks    Status On-going   3/10 pain in shoulders; 4/10 pain in Rt hip   Target Date 10/22/20             PT Long Term Goals - 09/30/20 1204      PT LONG TERM GOAL #5   Title Patient will complete five times sit to stand in <13s to indicate decreased fall risk    Baseline 22s    Time 9    Period Weeks    Status New    Target Date 12/03/20                 Plan - 10/07/20 1230    Clinical Impression Statement Patient reporting increased knee pain when performing sit to stand activity. Reporting decreased knee pain during concentric segment of movement with verbal and tactile cues for decreased hip adduction. Verbal and visual demonstration for shifting weight through heels with patient exhibiting decreased eccentric control when transitioning stand to sit. However reporting decreased knee pain indicating need for continued quad eccentric training bilaterally. Would benefit from continued skilled intervention for improved functional mobility and improved functional use of bil UE for ADLs.    Personal Factors and Comorbidities Comorbidity 3+    Comorbidities arthritis, cancer, depression    Examination-Activity Limitations Carry;Caring for Others;Reach Overhead;Sleep    Examination-Participation Restrictions Meal Prep;Cleaning;Laundry    Rehab Potential Good    PT Frequency 2x / week    PT Duration 12 weeks    PT Treatment/Interventions ADLs/Self Care Home Management;Cryotherapy;Electrical Stimulation;Iontophoresis 4mg /ml Dexamethasone;Moist Heat;Traction;Therapeutic activities;Therapeutic exercise;Neuromuscular re-education;Patient/family  education;Manual techniques;Passive range of motion;Dry needling;Taping;Spinal Manipulations;Joint Manipulations;Functional mobility training;Gait training;Balance training    PT Next Visit Plan  continue strengthening and functional strengthening as appropriate; continue scapular stabilization    PT Home Exercise Plan Access Code: [email or web address]    Consulted and Agree with Plan of Care Patient           Patient will benefit from skilled therapeutic intervention in order to improve the following deficits and impairments:  Decreased activity tolerance,Decreased range of motion,Decreased strength,Hypomobility,Increased muscle spasms,Impaired UE functional use,Postural dysfunction,Improper body mechanics,Pain  Visit Diagnosis: Cramp and spasm  Chronic left shoulder pain  Chronic right shoulder pain  Pain in left hip  Pain in right hip     Problem List Patient Active Problem List   Diagnosis Date Noted  . NSVT (nonsustained ventricular tachycardia) ([place]) 09/25/2020  . Pre-diabetes 08/27/2020  . Chronic pain of both shoulders 05/05/2018  . Vitamin D deficiency 05/05/2018  . Other fatigue 01/02/2018  . Allergic rhinitis 01/02/2018  . Weight gain 01/02/2018  . PVC (premature ventricular contraction) 03/09/2017  . Routine general medical examination at a health care facility 09/01/2015  . Hyperlipidemia 09/01/2015  . Varicose vein 08/27/2014  . Primary localized osteoarthrosis, lower leg 10/30/2013  . Anxiety state 03/23/2013  . 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 Depression 01/01/2013  . Abnormal stress echo 12/11/2012  . Palpitations 09/14/2010   Everardo All PT, DPT  10/07/20 1:25 PM   [facility] [address], [place], [place], [address] Phone: [phone]   Fax:  [phone]  Name: Jean Smith MRN: [id number] Date of Birth: 1951/03/21

## 2020-10-09 ENCOUNTER — Other Ambulatory Visit: Payer: Self-pay

## 2020-10-09 ENCOUNTER — Encounter: Payer: Self-pay | Admitting: Physical Therapy

## 2020-10-09 ENCOUNTER — Ambulatory Visit: Payer: PPO | Admitting: Physical Therapy

## 2020-10-09 DIAGNOSIS — R252 Cramp and spasm: Secondary | ICD-10-CM

## 2020-10-09 DIAGNOSIS — G8929 Other chronic pain: Secondary | ICD-10-CM

## 2020-10-09 DIAGNOSIS — M25512 Pain in left shoulder: Secondary | ICD-10-CM

## 2020-10-09 NOTE — Therapy (Signed)
[facility] [address] [place], [place], [address] Phone: [phone]   Fax:  [phone]  Physical Therapy Treatment  Patient Details  Name: Jean Smith MRN: [id number] Date of Birth: 1950-12-10 Referring Provider (PT): Pricilla Holm, MD   Encounter Date: 10/09/2020   PT End of Session - 10/09/20 1857    Visit Number 7    Number of Visits 24    Date for PT Re-Evaluation 12/03/20    Authorization Type [employer]    Progress Note Due on Visit 10    PT Start Time 1100    PT Stop Time 1144    PT Time Calculation (min) 44 min    Activity Tolerance Patient tolerated treatment well           Past Medical History:  Diagnosis Date  . Allergy   . Anxiety   . Arthritis   . Cancer ([place])    skin  . Cataract    bilateral - MD is just watching   . Diverticulosis   . Fuchs' endothelial dystrophy    follows with optho regularly   . Gestational diabetes   . Heart murmur    MVP   . History of depression   . HSV-2 infection   . Hyperlipidemia    no meds - diet controlled  . Hyperplastic colon polyp   . Mitral valve prolapse   . PVC's (premature ventricular contractions)    intol of BB, sxc palpitations r/t stress  . RVOT ventricular tachycardia/PVCs    EP eval 01/2013 for freq PVCs    Past Surgical History:  Procedure Laterality Date  . CESAREAN SECTION     x 3  . COLONOSCOPY    . KNEE SURGERY Right   . MANDIBLE SURGERY     right side in front of ear  . MOHS SURGERY  2021  . POLYPECTOMY    . WISDOM TOOTH EXTRACTION      There were no vitals filed for this visit.   Subjective Assessment - 10/09/20 1107    Subjective My left knee hurts.  I don't like the complicated ex's.  I think I need to just do one thing at a time.  My back doesn't hurt today.    Pertinent History arthritis, history of cancer, depression    Currently in Pain? Yes    Pain Score 4     Pain Location Knee    Pain  Orientation Left    Pain Type Chronic pain    Pain Score 0    Pain Location Back                             [facility] Adult PT Treatment/Exercise - 10/09/20 0001      Lumbar Exercises: Standing   Row Strengthening;Both;20 reps    Theraband Level (Row) Level 3 (Green)    Row Limitations with ab brace    Shoulder Extension Strengthening;20 reps    Theraband Level (Shoulder Extension) Level 3 (Green)    Shoulder Extension Limitations with abdominal brace    Other Standing Lumbar Exercises Pallof series: staggered stance green band 5 reps each: press out, press up, march, retro step   knee pain with march     Lumbar Exercises: Seated   Other Seated Lumbar Exercises foam roll push 2x10 reps x 3s hold    Other Seated Lumbar Exercises holding 5# at waist mini sit ups 10x  Knee/Hip Exercises: Aerobic   Nustep L1/2 6 min while discussing progress                    PT Short Term Goals - 10/07/20 1321      PT SHORT TERM GOAL #1   Title Patient will be independent with HEP for continued progression at home.    Time 6    Period Weeks    Status On-going    Target Date 10/22/20      PT SHORT TERM GOAL #3   Title Patient will report no more than 4/10 pain for two consecutive sessions to indicate improved UE activity tolerance to more readily complete ADLs    Time 6    Period Weeks    Status On-going   3/10 pain in shoulders; 4/10 pain in Rt hip   Target Date 10/22/20             PT Long Term Goals - 09/30/20 1204      PT LONG TERM GOAL #5   Title Patient will complete five times sit to stand in <13s to indicate decreased fall risk    Baseline 22s    Time 9    Period Weeks    Status New    Target Date 12/03/20                 Plan - 10/09/20 1858    Clinical Impression Statement Treatment modified to accomodate newer onset knee pain particularly with standing.  Treatment focus on activating shoulder, periscapular and core muscles in  standing and seated positions.  Verbal cues to coordinate breathing and to avoid holding her breath and to best activate targeted muscles without compensatory strategies. She reports feeling better post treatment.    Comorbidities arthritis, cancer, depression    Examination-Activity Limitations Carry;Caring for Others;Reach Overhead;Sleep    Rehab Potential Good    PT Frequency 2x / week    PT Duration 12 weeks    PT Treatment/Interventions ADLs/Self Care Home Management;Cryotherapy;Electrical Stimulation;Iontophoresis 4mg /ml Dexamethasone;Moist Heat;Traction;Therapeutic activities;Therapeutic exercise;Neuromuscular re-education;Patient/family education;Manual techniques;Passive range of motion;Dry needling;Taping;Spinal Manipulations;Joint Manipulations;Functional mobility training;Gait training;Balance training    PT Next Visit Plan continue strengthening and functional strengthening as appropriate; continue scapular stabilization    PT Home Exercise Plan Access Code: [email or web address]           Patient will benefit from skilled therapeutic intervention in order to improve the following deficits and impairments:  Decreased activity tolerance,Decreased range of motion,Decreased strength,Hypomobility,Increased muscle spasms,Impaired UE functional use,Postural dysfunction,Improper body mechanics,Pain  Visit Diagnosis: Cramp and spasm  Chronic left shoulder pain  Chronic right shoulder pain     Problem List Patient Active Problem List   Diagnosis Date Noted  . NSVT (nonsustained ventricular tachycardia) ([place]) 09/25/2020  . Pre-diabetes 08/27/2020  . Chronic pain of both shoulders 05/05/2018  . Vitamin D deficiency 05/05/2018  . Other fatigue 01/02/2018  . Allergic rhinitis 01/02/2018  . Weight gain 01/02/2018  . PVC (premature ventricular contraction) 03/09/2017  . Routine general medical examination at a health care facility 09/01/2015  . Hyperlipidemia 09/01/2015  . Varicose vein  08/27/2014  . Primary localized osteoarthrosis, lower leg 10/30/2013  . Anxiety state 03/23/2013  . 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 Depression 01/01/2013  . Abnormal stress echo 12/11/2012  . Palpitations 09/14/2010   Ruben Im, PT 10/09/20 7:26 PM Phone: [phone] Fax: [phone] Alvera Singh 10/09/2020, 7:26 PM  [facility] [address],  [place], [place], [address] Phone: [phone]   Fax:  [phone]  Name: SHARMAINE BAIN MRN: [id number] Date of Birth: 1951/01/23

## 2020-10-14 ENCOUNTER — Other Ambulatory Visit: Payer: Self-pay

## 2020-10-14 ENCOUNTER — Ambulatory Visit: Payer: PPO | Attending: Internal Medicine | Admitting: Physical Therapy

## 2020-10-14 DIAGNOSIS — M6281 Muscle weakness (generalized): Secondary | ICD-10-CM | POA: Insufficient documentation

## 2020-10-14 DIAGNOSIS — M25552 Pain in left hip: Secondary | ICD-10-CM | POA: Diagnosis not present

## 2020-10-14 DIAGNOSIS — M25551 Pain in right hip: Secondary | ICD-10-CM | POA: Diagnosis not present

## 2020-10-14 DIAGNOSIS — M25511 Pain in right shoulder: Secondary | ICD-10-CM | POA: Insufficient documentation

## 2020-10-14 DIAGNOSIS — M25562 Pain in left knee: Secondary | ICD-10-CM | POA: Insufficient documentation

## 2020-10-14 DIAGNOSIS — G8929 Other chronic pain: Secondary | ICD-10-CM | POA: Diagnosis not present

## 2020-10-14 DIAGNOSIS — R252 Cramp and spasm: Secondary | ICD-10-CM

## 2020-10-14 DIAGNOSIS — M25512 Pain in left shoulder: Secondary | ICD-10-CM | POA: Insufficient documentation

## 2020-10-14 NOTE — Patient Instructions (Signed)
Access Code: [email or web address] URL: [email or web address] Date: 10/14/2020 Prepared by: Ruben Im  Program Notes Perform for both legs.   Exercises Bridge with Resistance - 1 x daily - 7 x weekly - 2 sets - 10 reps Clamshell with Resistance - 1 x daily - 7 x weekly - 2 sets - 10 reps Seated Hip Adduction Isometrics with Ball - 1 x daily - 7 x weekly - 2 sets - 10 reps Seated March - 1 x daily - 7 x weekly - 2 sets - 10 reps Heel rises with counter support - 1 x daily - 7 x weekly - 2 sets - 10 reps Hooklying Transversus Abdominis Palpation - 1 x daily - 7 x weekly - 1 sets - 10 reps Hooklying Isometric Hip Flexion - 1 x daily - 7 x weekly - 1 sets - 10 reps Supine 90/90 Abdominal Bracing - 1 x daily - 7 x weekly - 5 reps Push ups on kitchen counter - 1 x daily - 7 x weekly - 2 sets - 10 reps Bent Over Single Arm Shoulder Row with Dumbbell - 1 x daily - 7 x weekly - 1 sets - 10 reps Standing Bent Over Single Arm Scapular Row with Table Support with PLB - 1 x daily - 7 x weekly - 1 sets - 10 reps Single Arm Bent Over Shoulder Extension with Dumbbell - 1 x daily - 7 x weekly - 1 sets - 10 reps Half Deadlift with Kettlebell - 1 x daily - 7 x weekly - 1 sets - 10 reps

## 2020-10-14 NOTE — Therapy (Signed)
[facility] [address] [place], [place], [address] Phone: [phone]   Fax:  [phone]  Physical Therapy Treatment  Patient Details  Name: Jean Smith MRN: [id number] Date of Birth: May 27, 1951 Referring Provider (PT): Pricilla Holm, MD   Encounter Date: 10/14/2020   PT End of Session - 10/14/20 2005    Visit Number 8    Number of Visits 24    Date for PT Re-Evaluation 12/03/20    Authorization Type [employer]    Progress Note Due on Visit 10    PT Start Time 5093    PT Stop Time 1058    PT Time Calculation (min) 43 min    Activity Tolerance Patient tolerated treatment well           Past Medical History:  Diagnosis Date  . Allergy   . Anxiety   . Arthritis   . Cancer ([place])    skin  . Cataract    bilateral - MD is just watching   . Diverticulosis   . Fuchs' endothelial dystrophy    follows with optho regularly   . Gestational diabetes   . Heart murmur    MVP   . History of depression   . HSV-2 infection   . Hyperlipidemia    no meds - diet controlled  . Hyperplastic colon polyp   . Mitral valve prolapse   . PVC's (premature ventricular contractions)    intol of BB, sxc palpitations r/t stress  . RVOT ventricular tachycardia/PVCs    EP eval 01/2013 for freq PVCs    Past Surgical History:  Procedure Laterality Date  . CESAREAN SECTION     x 3  . COLONOSCOPY    . KNEE SURGERY Right   . MANDIBLE SURGERY     right side in front of ear  . MOHS SURGERY  2021  . POLYPECTOMY    . WISDOM TOOTH EXTRACTION      There were no vitals filed for this visit.   Subjective Assessment - 10/14/20 1024    Subjective I'm OK. My shoulders are feeling better.  My back hurt yesterday but not today.   My knee doesn't hurt now.    Pertinent History arthritis, history of cancer, depression    Limitations House hold activities    Currently in Pain? No/denies    Pain Score 0-No pain     Pain Location Shoulder    Pain Type Chronic pain    Pain Score 0    Pain Location Knee                             [facility] Adult PT Treatment/Exercise - 10/14/20 0001      Therapeutic Activites    Lifting modified dead lift 10x      Lumbar Exercises: Standing   Row Strengthening;Right;Left;10 reps    Row Limitations kneeling on edge of mat table 2#    Shoulder Extension Right;Left;10 reps    Shoulder Extension Limitations kneeling on edge of bed 2#    Other Standing Lumbar Exercises counter push ups 2x 10    Other Standing Lumbar Exercises kneeling on edge of bed horizontal abduction 2# 10x      Knee/Hip Exercises: Aerobic   Stationary Bike 6 min while discussing status/progress      Shoulder Exercises: Prone   Other Prone Exercises --      Shoulder Exercises: ROM/Strengthening  Pushups 10 reps    Pushups Limitations on counter    Other ROM/Strengthening Exercises doorway psoas stretch with UE movement 5x 3 sets right/left    Other ROM/Strengthening Exercises UE wall slides 7x                  PT Education - 10/14/20 2004    Education Details bent rows, extensions, horizontal abduction with light weight; counter push ups    Person(s) Educated Patient    Methods Explanation;Demonstration;Handout    Comprehension Returned demonstration;Verbalized understanding            PT Short Term Goals - 10/07/20 1321      PT SHORT TERM GOAL #1   Title Patient will be independent with HEP for continued progression at home.    Time 6    Period Weeks    Status On-going    Target Date 10/22/20      PT SHORT TERM GOAL #3   Title Patient will report no more than 4/10 pain for two consecutive sessions to indicate improved UE activity tolerance to more readily complete ADLs    Time 6    Period Weeks    Status On-going   3/10 pain in shoulders; 4/10 pain in Rt hip   Target Date 10/22/20             PT Long Term Goals - 09/30/20 1204      PT LONG  TERM GOAL #5   Title Patient will complete five times sit to stand in <13s to indicate decreased fall risk    Baseline 22s    Time 9    Period Weeks    Status New    Target Date 12/03/20                 Plan - 10/14/20 1048    Clinical Impression Statement The patient is able to perform upper quarter/scapular strengthening focused ex's without complaints of shoulder pain.  No back or knee complaints today either.  Verbal and tactile cues to encourage scapular retraction for optimum strengthening of scapular musculature.  Therapist monitoring response throughout treatment session.    Comorbidities arthritis, cancer, depression    Examination-Activity Limitations Carry;Caring for Others;Reach Overhead;Sleep    Rehab Potential Good    PT Frequency 2x / week    PT Duration 12 weeks    PT Treatment/Interventions ADLs/Self Care Home Management;Cryotherapy;Electrical Stimulation;Iontophoresis 4mg /ml Dexamethasone;Moist Heat;Traction;Therapeutic activities;Therapeutic exercise;Neuromuscular re-education;Patient/family education;Manual techniques;Passive range of motion;Dry needling;Taping;Spinal Manipulations;Joint Manipulations;Functional mobility training;Gait training;Balance training    PT Next Visit Plan continue functional strengthening and glenohumeral and  scapular stabilization;  check remaining STGs next week    PT Home Exercise Plan Access Code: [email or web address]           Patient will benefit from skilled therapeutic intervention in order to improve the following deficits and impairments:  Decreased activity tolerance,Decreased range of motion,Decreased strength,Hypomobility,Increased muscle spasms,Impaired UE functional use,Postural dysfunction,Improper body mechanics,Pain  Visit Diagnosis: Cramp and spasm  Chronic left shoulder pain  Chronic right shoulder pain     Problem List Patient Active Problem List   Diagnosis Date Noted  . NSVT (nonsustained ventricular  tachycardia) ([place]) 09/25/2020  . Pre-diabetes 08/27/2020  . Chronic pain of both shoulders 05/05/2018  . Vitamin D deficiency 05/05/2018  . Other fatigue 01/02/2018  . Allergic rhinitis 01/02/2018  . Weight gain 01/02/2018  . PVC (premature ventricular contraction) 03/09/2017  . Routine general medical examination at a health care facility 09/01/2015  .  Hyperlipidemia 09/01/2015  . Varicose vein 08/27/2014  . Primary localized osteoarthrosis, lower leg 10/30/2013  . Anxiety state 03/23/2013  . 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 Depression 01/01/2013  . Abnormal stress echo 12/11/2012  . Palpitations 09/14/2010   Ruben Im, PT 10/14/20 8:17 PM Phone: [phone] Fax: [phone] Alvera Singh 10/14/2020, 8:17 PM  [facility] [address] [place], [place], [address] Phone: [phone]   Fax:  [phone]  Name: Jean Smith MRN: [id number] Date of Birth: 11-28-1950

## 2020-10-16 ENCOUNTER — Other Ambulatory Visit: Payer: Self-pay

## 2020-10-16 ENCOUNTER — Ambulatory Visit: Payer: PPO | Admitting: Physical Therapy

## 2020-10-16 DIAGNOSIS — M25552 Pain in left hip: Secondary | ICD-10-CM

## 2020-10-16 DIAGNOSIS — R252 Cramp and spasm: Secondary | ICD-10-CM | POA: Diagnosis not present

## 2020-10-16 DIAGNOSIS — G8929 Other chronic pain: Secondary | ICD-10-CM

## 2020-10-16 DIAGNOSIS — M25551 Pain in right hip: Secondary | ICD-10-CM

## 2020-10-16 DIAGNOSIS — M25562 Pain in left knee: Secondary | ICD-10-CM

## 2020-10-16 NOTE — Therapy (Signed)
[facility] [address] [place], [place], [address] Phone: [phone]   Fax:  [phone]  Physical Therapy Treatment  Patient Details  Name: Jean Smith MRN: [id number] Date of Birth: 1950-11-02 Referring Provider (PT): Pricilla Holm, MD   Encounter Date: 10/16/2020   PT End of Session - 10/16/20 1223    Visit Number 9    Number of Visits 24    Date for PT Re-Evaluation 12/03/20    Authorization Type [employer]    Progress Note Due on Visit 10    PT Start Time 1017    PT Stop Time 1056    PT Time Calculation (min) 39 min    Activity Tolerance Patient tolerated treatment well    Behavior During Therapy [facility] for tasks assessed/performed           Past Medical History:  Diagnosis Date  . Allergy   . Anxiety   . Arthritis   . Cancer ([place])    skin  . Cataract    bilateral - MD is just watching   . Diverticulosis   . Fuchs' endothelial dystrophy    follows with optho regularly   . Gestational diabetes   . Heart murmur    MVP   . History of depression   . HSV-2 infection   . Hyperlipidemia    no meds - diet controlled  . Hyperplastic colon polyp   . Mitral valve prolapse   . PVC's (premature ventricular contractions)    intol of BB, sxc palpitations r/t stress  . RVOT ventricular tachycardia/PVCs    EP eval 01/2013 for freq PVCs    Past Surgical History:  Procedure Laterality Date  . CESAREAN SECTION     x 3  . COLONOSCOPY    . KNEE SURGERY Right   . MANDIBLE SURGERY     right side in front of ear  . MOHS SURGERY  2021  . POLYPECTOMY    . WISDOM TOOTH EXTRACTION      There were no vitals filed for this visit.   Subjective Assessment - 10/16/20 1027    Subjective Feels better. Is not having as much shoulder pain. Continues to have increased Lt shoulder stiffness. Continues to have Lt knee pain and intermittent Rt LE pain.    Pertinent History arthritis, history of  cancer, depression    Limitations House hold activities    How long can you sit comfortably? unlimited    How long can you stand comfortably? unlimited    How long can you walk comfortably? unlimited    Diagnostic tests None    Pain Score 2     Pain Location Shoulder    Pain Orientation Left    Pain Descriptors / Indicators Tightness    Pain Type Chronic pain    Pain Score 2    Pain Location Knee    Pain Orientation Left    Pain Descriptors / Indicators Aching    Pain Type Chronic pain    Pain Onset More than a month ago    Pain Frequency Intermittent                             [facility] Adult PT Treatment/Exercise - 10/16/20 0001      Therapeutic Activites    Lifting dumbbell dead lift; 5 lbs; x10 repetitions      Lumbar Exercises: Standing   Row Strengthening;Right;Left;10 reps  Row Limitations 2#; bent over high table    Shoulder Extension Right;Left;10 reps    Shoulder Extension Limitations 2#; bent over high table    Other Standing Lumbar Exercises counter push up x15    Other Standing Lumbar Exercises kneeling on edge of bed horizontal abduction 2# 10x bil UE      Lumbar Exercises: Quadruped   Other Quadruped Lumbar Exercises 1/2 kneel derotation press blue tband; x10 reps each side      Knee/Hip Exercises: Aerobic   Recumbent Bike L2 x 5 min; PT present to discuss progress                    PT Short Term Goals - 10/16/20 1222      PT SHORT TERM GOAL #1   Title Patient will be independent with HEP for continued progression at home.    Time 6    Period Weeks    Status On-going   Patient reports that she does not use resistance because she forgets or does not feel like walking to her room to retrieve band   Target Date 10/22/20      PT SHORT TERM GOAL #3   Title Patient will report no more than 4/10 pain for two consecutive sessions to indicate improved UE activity tolerance to more readily complete ADLs    Time 6    Period Weeks     Status Achieved   0/10 pain last session; 2/10 pain this session   Target Date 10/22/20             PT Long Term Goals - 09/30/20 1204      PT LONG TERM GOAL #5   Title Patient will complete five times sit to stand in <13s to indicate decreased fall risk    Baseline 22s    Time 9    Period Weeks    Status New    Target Date 12/03/20                 Plan - 10/16/20 1220    Clinical Impression Statement Patient performing all exercises without reportf of increased pain. Continues to require intermittent cuing for maintaining neutral scapular retraction with all exercises to maintain postural alignment. Would benefit from continued skilled intervention for improved functional activity tolerance.    Personal Factors and Comorbidities Comorbidity 3+    Comorbidities arthritis, cancer, depression    Examination-Activity Limitations Carry;Caring for Others;Reach Overhead;Sleep    Examination-Participation Restrictions Meal Prep;Cleaning;Laundry    Rehab Potential Good    PT Frequency 2x / week    PT Duration 12 weeks    PT Treatment/Interventions ADLs/Self Care Home Management;Cryotherapy;Electrical Stimulation;Iontophoresis 4mg /ml Dexamethasone;Moist Heat;Traction;Therapeutic activities;Therapeutic exercise;Neuromuscular re-education;Patient/family education;Manual techniques;Passive range of motion;Dry needling;Taping;Spinal Manipulations;Joint Manipulations;Functional mobility training;Gait training;Balance training    PT Next Visit Plan continue to progress postural and functional strengthening; progress report next session    PT Home Exercise Plan Access Code: [email or web address]    Consulted and Agree with Plan of Care Patient           Patient will benefit from skilled therapeutic intervention in order to improve the following deficits and impairments:  Decreased activity tolerance,Decreased range of motion,Decreased strength,Hypomobility,Increased muscle spasms,Impaired UE  functional use,Postural dysfunction,Improper body mechanics,Pain  Visit Diagnosis: Cramp and spasm  Chronic left shoulder pain  Chronic right shoulder pain  Pain in left hip  Pain in right hip  Chronic pain of left knee     Problem List Patient Active  Problem List   Diagnosis Date Noted  . NSVT (nonsustained ventricular tachycardia) ([place]) 09/25/2020  . Pre-diabetes 08/27/2020  . Chronic pain of both shoulders 05/05/2018  . Vitamin D deficiency 05/05/2018  . Other fatigue 01/02/2018  . Allergic rhinitis 01/02/2018  . Weight gain 01/02/2018  . PVC (premature ventricular contraction) 03/09/2017  . Routine general medical examination at a health care facility 09/01/2015  . Hyperlipidemia 09/01/2015  . Varicose vein 08/27/2014  . Primary localized osteoarthrosis, lower leg 10/30/2013  . Anxiety state 03/23/2013  . 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 Depression 01/01/2013  . Abnormal stress echo 12/11/2012  . Palpitations 09/14/2010   Everardo All PT, DPT  10/16/20 12:25 PM   [facility] [address], [place], [place], [address] Phone: [phone]   Fax:  [phone]  Name: Jean Smith MRN: [id number] Date of Birth: 1950-12-02

## 2020-10-17 ENCOUNTER — Encounter (HOSPITAL_BASED_OUTPATIENT_CLINIC_OR_DEPARTMENT_OTHER): Payer: Self-pay | Admitting: Obstetrics & Gynecology

## 2020-10-17 ENCOUNTER — Ambulatory Visit (INDEPENDENT_AMBULATORY_CARE_PROVIDER_SITE_OTHER): Payer: PPO | Admitting: Obstetrics & Gynecology

## 2020-10-17 ENCOUNTER — Other Ambulatory Visit (HOSPITAL_COMMUNITY)
Admission: RE | Admit: 2020-10-17 | Discharge: 2020-10-17 | Disposition: A | Discharge status: Home / Self Care | Payer: PPO | Source: Ambulatory Visit | Attending: Obstetrics & Gynecology | Admitting: Obstetrics & Gynecology

## 2020-10-17 ENCOUNTER — Ambulatory Visit: Payer: BC Managed Care – PPO

## 2020-10-17 ENCOUNTER — Ambulatory Visit: Payer: BC Managed Care – PPO | Admitting: Nurse Practitioner

## 2020-10-17 VITALS — BP 121/72 | HR 79 | Ht 63.5 in | Wt 170.6 lb

## 2020-10-17 DIAGNOSIS — Z1231 Encounter for screening mammogram for malignant neoplasm of breast: Secondary | ICD-10-CM | POA: Diagnosis not present

## 2020-10-17 DIAGNOSIS — Z124 Encounter for screening for malignant neoplasm of cervix: Secondary | ICD-10-CM | POA: Diagnosis not present

## 2020-10-17 DIAGNOSIS — Z9189 Other specified personal risk factors, not elsewhere classified: Secondary | ICD-10-CM

## 2020-10-17 DIAGNOSIS — F411 Generalized anxiety disorder: Secondary | ICD-10-CM | POA: Diagnosis not present

## 2020-10-17 DIAGNOSIS — M8580 Other specified disorders of bone density and structure, unspecified site: Secondary | ICD-10-CM | POA: Diagnosis not present

## 2020-10-17 DIAGNOSIS — Z01411 Encounter for gynecological examination (general) (routine) with abnormal findings: Secondary | ICD-10-CM

## 2020-10-17 DIAGNOSIS — E785 Hyperlipidemia, unspecified: Secondary | ICD-10-CM

## 2020-10-17 DIAGNOSIS — B009 Herpesviral infection, unspecified: Secondary | ICD-10-CM | POA: Diagnosis not present

## 2020-10-17 DIAGNOSIS — Z780 Asymptomatic menopausal state: Secondary | ICD-10-CM

## 2020-10-17 NOTE — Progress Notes (Signed)
70 y.o. [id number] Married White or Caucasian female here for breast and pelvic exam.  Son had traumatic brain injury after falling out of stairs.  Had craniotomy and had to have second craniotomy after falling in the IUD again.  He was in rehab.  He came home on 3/5.  His name is Jean Smith.  Function is back to normal.    Has gained 16 pounds since last year due to stressors, she feels.  Working on eating better and wants to get a stationary bike.  Had some questionable chest pain/discomfort last year.  Had cardiac echo last year.  This was normal.  CT was also recommended.  She decided to postpone it and hasn't rescheduled.  Heart monitor recommended.  She has not done this yet.  Has the monitor but just hasn't done it yet.  Denies vaginal bleeding.  No LMP recorded. Patient is postmenopausal.          Sexually active: No.  H/O STD:  H/o hsv 2  Health Maintenance: PCP:  Dr. Sharlet Salina.  Last wellness appt was 05/20/2020.  Did blood work at that appt:  Yes.  Lab work reviewed. Vaccines are up to date:  reviewed Colonoscopy:  2020 with Dr. Silverio Decamp, follow up 3 years MMG:  03/2020 BMD:  2016, osteopenia Last pap smear:  2019.   H/o abnormal pap smear:  no   reports that she quit smoking about 37 years ago. Her smoking use included cigarettes. She has never used smokeless tobacco. She reports previous alcohol use. She reports that she does not use drugs.  Past Medical History:  Diagnosis Date  . Allergy   . Anxiety   . Arthritis   . Cancer ([place])    skin  . Cataract    bilateral - MD is just watching   . Diverticulosis   . Fuchs' endothelial dystrophy    follows with optho regularly   . Gestational diabetes   . Heart murmur    MVP   . History of depression   . HSV-2 infection   . Hyperlipidemia    no meds - diet controlled  . Hyperplastic colon polyp   . Mitral valve prolapse   . PVC's (premature ventricular contractions)    intol of BB, sxc palpitations r/t stress  . RVOT  ventricular tachycardia/PVCs    EP eval 01/2013 for freq PVCs    Past Surgical History:  Procedure Laterality Date  . CESAREAN SECTION     x 3  . COLONOSCOPY    . KNEE SURGERY Right   . MANDIBLE SURGERY     right side in front of ear  . MOHS SURGERY  2021  . POLYPECTOMY    . WISDOM TOOTH EXTRACTION      Current Outpatient Medications  Medication Sig Dispense Refill  . ALPRAZolam (XANAX) 0.25 MG tablet Take 1 tablet (0.25 mg total) by mouth daily as needed for anxiety. 90 tablet 1  . Cholecalciferol (VITAMIN D3) 50 MCG (2000 UT) TABS Take 1,000 mg by mouth daily.     Marland Kitchen loratadine (CLARITIN) 10 MG tablet Take 10 mg by mouth daily as needed for allergies.    Marland Kitchen MAGNESIUM GLUCONATE PO Take 200 mg by mouth 2 (two) times daily.     . propranolol (INDERAL) 10 MG tablet Take 1 tablet (10 mg total) by mouth as needed. 90 tablet 3  . metoprolol tartrate (LOPRESSOR) 100 MG tablet Take 1 tablet (100 mg total) two hours prior to CT scan. (Patient not  taking: Reported on 10/17/2020) 1 tablet 0   No current facility-administered medications for this visit.    Family History  Problem Relation Age of Onset  . Colon cancer Mother        dx'd in her 61's-- stage 2   . Heart disease Father   . Colon cancer Maternal Grandmother   . Colon polyps Sister   . Colon polyps Sister   . Alcohol abuse Other   . Rectal cancer Neg Hx   . Stomach cancer Neg Hx   . Esophageal cancer Neg Hx     Review of Systems  Constitutional: Positive for unexpected weight change.    Exam:   BP 121/72 (BP Location: Left Arm, Patient Position: Sitting, Cuff Size: Large)   Pulse 79   Ht 5' 3.5" (1.613 m)   Wt 170 lb 9.6 oz (77.4 kg)   BMI 29.75 kg/m   Height: 5' 3.5" (161.3 cm)  General appearance: alert, cooperative and appears stated age Breasts: normal appearance, no masses or tenderness Abdomen: soft, non-tender; bowel sounds normal; no masses,  no organomegaly Lymph nodes: Cervical, supraclavicular, and  axillary nodes normal.  No abnormal inguinal nodes palpated Neurologic: Grossly normal  Pelvic: External genitalia:  no lesions              Urethra:  normal appearing urethra with no masses, tenderness or lesions              Bartholins and Skenes: normal                 Vagina: normal appearing vagina with atrophic changes and no discharge, no lesions              Cervix: no lesions              Pap taken: Yes.   Bimanual Exam:  Uterus:  normal size, contour, position, consistency, mobility, non-tender              Adnexa: normal adnexa and no mass, fullness, tenderness               Rectovaginal: Confirms               Anus:  normal sphincter tone, no lesions  Chaperone, Octaviano Batty, CMA, was present for exam.  Assessment/Plan: 1. GYN exam for high-risk Medicare patient - pap smear obtained today.  Shared decision making occurred today.  Will continue utnil 75. - MMG order placed - BMD order placed - vaccines updated - lab work done 05/2020 - colonoscopy due next year  2. Postmenopausal - no HRT  3. Cervical cancer screening - Cytology - PAP( [place]) - PR OBTAINING SCREEN PAP SMEAR  4. Anxiety state  5. HSV infection  6. Elevated lipids  7. Osteopenia, unspecified location - DG BONE DENSITY (DXA); Future

## 2020-10-21 ENCOUNTER — Encounter: Payer: PPO | Admitting: Physical Therapy

## 2020-10-21 LAB — CYTOLOGY - PAP
Adequacy: ABSENT
Diagnosis: NEGATIVE

## 2020-10-23 ENCOUNTER — Encounter: Payer: PPO | Admitting: Physical Therapy

## 2020-10-29 ENCOUNTER — Other Ambulatory Visit (HOSPITAL_BASED_OUTPATIENT_CLINIC_OR_DEPARTMENT_OTHER): Payer: PPO

## 2020-10-30 ENCOUNTER — Other Ambulatory Visit: Payer: Self-pay

## 2020-10-30 ENCOUNTER — Ambulatory Visit: Payer: PPO | Admitting: Physical Therapy

## 2020-10-30 DIAGNOSIS — R252 Cramp and spasm: Secondary | ICD-10-CM

## 2020-10-30 DIAGNOSIS — M25512 Pain in left shoulder: Secondary | ICD-10-CM

## 2020-10-30 DIAGNOSIS — M25552 Pain in left hip: Secondary | ICD-10-CM

## 2020-10-30 DIAGNOSIS — M25551 Pain in right hip: Secondary | ICD-10-CM

## 2020-10-30 DIAGNOSIS — G8929 Other chronic pain: Secondary | ICD-10-CM

## 2020-10-30 NOTE — Patient Instructions (Signed)
Standing terminal knee extension: 3 set of 10 repetitions daily

## 2020-10-30 NOTE — Therapy (Addendum)
[facility] [address] [place], [place], [address] Phone: [phone]   Fax:  [phone]  Physical Therapy Treatment  Patient Details  Name: Jean Smith MRN: [id number] Date of Birth: 21-Oct-1950 Referring Provider (PT): Pricilla Holm, MD   Encounter Date: 10/30/2020   Progress Note Reporting Period 09/10/2020 to 10/30/2020  See note below for Objective Data and Assessment of Progress/Goals.        PT End of Session - 10/30/20 1517    Visit Number 10    Number of Visits 24    Date for PT Re-Evaluation 12/03/20    Authorization Type [employer]    Progress Note Due on Visit 20    PT Start Time 1237   extended time spent at check in   PT Stop Time 1315    PT Time Calculation (min) 38 min    Activity Tolerance Patient tolerated treatment well    Behavior During Therapy [facility] for tasks assessed/performed           Past Medical History:  Diagnosis Date  . Allergy   . Anxiety   . Arthritis   . Cancer ([place])    skin  . Cataract    bilateral - MD is just watching   . Diverticulosis   . Fuchs' endothelial dystrophy    follows with optho regularly   . Gestational diabetes   . Heart murmur    MVP   . History of depression   . HSV-2 infection   . Hyperlipidemia    no meds - diet controlled  . Hyperplastic colon polyp   . Mitral valve prolapse   . PVC's (premature ventricular contractions)    intol of BB, sxc palpitations r/t stress  . RVOT ventricular tachycardia/PVCs    EP eval 01/2013 for freq PVCs    Past Surgical History:  Procedure Laterality Date  . CESAREAN SECTION     x 3  . COLONOSCOPY    . KNEE SURGERY Right   . MANDIBLE SURGERY     right side in front of ear  . MOHS SURGERY  2021  . POLYPECTOMY    . WISDOM TOOTH EXTRACTION      There were no vitals filed for this visit.   Subjective Assessment - 10/30/20 1500    Subjective MIssed last week due to upper  respiratory infection. Continues to have knee and shoulder pain. Intermittently performs HEP.    Pertinent History arthritis, history of cancer, depression    Limitations House hold activities    How long can you sit comfortably? unlimited    How long can you stand comfortably? unlimited    How long can you walk comfortably? unlimited    Diagnostic tests None    Currently in Pain? Yes    Pain Score 4    Pain Location Knee    Pain Orientation Right    Pain Descriptors / Indicators Aching              [facility] PT Assessment - 10/30/20 0001      Assessment   Medical Diagnosis M25.511,G89.29,M25.512 (ICD-10-CM) - Chronic pain of both shoulders, M25.551,M25.552,G89.29 (ICD-10-CM) - Chronic pain of both hips  M25.561,M25.562,G89.29 (ICD-10-CM) - Chronic pain of both knees    Referring Provider (PT) Pricilla Holm, MD    Onset Date/Surgical Date --   2 years ago   Hand Dominance Right    Prior Therapy Yes- Rt shoulder 2020      Precautions  Precautions None      Restrictions   Weight Bearing Restrictions No      Balance Screen   Has the patient fallen in the past 6 months No    Has the patient had a decrease in activity level because of a fear of falling?  Yes    Is the patient reluctant to leave their home because of a fear of falling?  No      Home Ecologist residence    Living Arrangements Spouse/significant other    Type of [place] One level      Prior Function   Level of [place] Retired      Observation/Other Assessments   Focus on Therapeutic Outcomes (FOTO)  50 (goal 64)      AROM   Right Shoulder Flexion 159 Degrees    Right Shoulder ABduction 111 Degrees    Left Shoulder Flexion 127 Degrees    Left Shoulder ABduction 114 Degrees      Transfers   Five time sit to stand comments  18.67 seconds   Patient reporting Rt knee pain                        [facility] Adult PT  Treatment/Exercise - 10/30/20 0001      Knee/Hip Exercises: Aerobic   Recumbent Bike L2 x 5 min; PT present to discuss progress      Knee/Hip Exercises: Standing   Terminal Knee Extension Both;1 set;15 reps    Terminal Knee Extension Limitations with ball behind knee      Knee/Hip Exercises: Seated   Long Arc Quad Both;2 sets;10 reps    Long Arc Quad Limitations with ball squeeze for VMO    Clamshell with TheraBand Red   2 x 12 repetitions   Sit to [employer] 10 reps;without UE support   verbal cues for bringing feet close to body and decreased hip adduction                 PT Education - 10/30/20 1502    Education Details standing terminal knee extensions    Person(s) Educated Patient    Methods Explanation;Demonstration;Tactile cues;Verbal cues;Handout    Comprehension Verbalized understanding;Returned demonstration;Verbal cues required;Tactile cues required            PT Short Term Goals - 10/30/20 1516      PT SHORT TERM GOAL #1   Title Patient will be independent with HEP for continued progression at home.    Time 6    Period Weeks    Status Achieved    Target Date 10/22/20      PT SHORT TERM GOAL #2   Title Patient will improve shoulder flexion to 120 degrees bilaterally for improved overhead reaching.    Baseline Lt 102; Rt 95    Time 6    Period Weeks    Status Achieved   Lt 127/Rt 159   Target Date 10/22/20      PT SHORT TERM GOAL #3   Title Patient will report no more than 4/10 pain for two consecutive sessions to indicate improved UE activity tolerance to more readily complete ADLs    Time 6    Period Weeks    Status Achieved   previously achieved   Target Date 10/22/20             PT Long Term Goals - 10/30/20  [place] #2   Title Patient will improved FOTO score to 64 or higher to indicate improved overall function.    Time 12    Period Weeks    Status New      PT LONG TERM GOAL #5   Title Patient will complete five  times sit to stand in <13s to indicate decreased fall risk    Baseline 22s    Time 9    Period Weeks    Status On-going   18.67 seconds                Plan - 10/30/20 1502    Clinical Impression Statement Patient reporting some decrease in Rt knee pain at end of session. Verbal cues continue to be required for proper mechanics when performing sit to stand as patient continues to demonstrate increased hip adduction. Five times sit to stand time improved as compared to last testing however, continues to indicate increased risk for falls. Would benefit from continued skilled intervention to address impairments for improved functional mobility.    Personal Factors and Comorbidities Comorbidity 3+    Comorbidities arthritis, cancer, depression    Examination-Activity Limitations Carry;Caring for Others;Reach Overhead;Sleep    Examination-Participation Restrictions Meal Prep;Cleaning;Laundry    Rehab Potential Good    PT Frequency 2x / week    PT Duration 12 weeks    PT Treatment/Interventions ADLs/Self Care Home Management;Cryotherapy;Electrical Stimulation;Iontophoresis 4mg /ml Dexamethasone;Moist Heat;Traction;Therapeutic activities;Therapeutic exercise;Neuromuscular re-education;Patient/family education;Manual techniques;Passive range of motion;Dry needling;Taping;Spinal Manipulations;Joint Manipulations;Functional mobility training;Gait training;Balance training    PT Next Visit Plan continue postural and functional strengthening; focus on knee stabilizers    PT Home Exercise Plan Access Code: [email or web address]    Consulted and Agree with Plan of Care Patient           Patient will benefit from skilled therapeutic intervention in order to improve the following deficits and impairments:  Decreased activity tolerance,Decreased range of motion,Decreased strength,Hypomobility,Increased muscle spasms,Impaired UE functional use,Postural dysfunction,Improper body mechanics,Pain  Visit  Diagnosis: Cramp and spasm  Chronic left shoulder pain  Chronic right shoulder pain  Pain in left hip  Pain in right hip  Chronic pain of left knee     Problem List Patient Active Problem List   Diagnosis Date Noted  . NSVT (nonsustained ventricular tachycardia) ([place]) 09/25/2020  . Pre-diabetes 08/27/2020  . Chronic pain of both shoulders 05/05/2018  . Vitamin D deficiency 05/05/2018  . Other fatigue 01/02/2018  . Allergic rhinitis 01/02/2018  . Weight gain 01/02/2018  . PVC (premature ventricular contraction) 03/09/2017  . Routine general medical examination at a health care facility 09/01/2015  . Hyperlipidemia 09/01/2015  . Varicose vein 08/27/2014  . Primary localized osteoarthrosis, lower leg 10/30/2013  . Anxiety state 03/23/2013  . 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 Depression 01/01/2013  . Abnormal stress echo 12/11/2012  . Palpitations 09/14/2010   Everardo All PT, DPT  10/30/20 3:20 PM   [facility] [address], [place], [place], [address] Phone: [phone]   Fax:  [phone]  Name: Jean Smith MRN: [id number] Date of Birth: 22-Jul-1950

## 2020-11-04 ENCOUNTER — Other Ambulatory Visit: Payer: Self-pay

## 2020-11-04 ENCOUNTER — Ambulatory Visit (HOSPITAL_BASED_OUTPATIENT_CLINIC_OR_DEPARTMENT_OTHER)
Admission: RE | Admit: 2020-11-04 | Discharge: 2020-11-04 | Disposition: A | Discharge status: Home / Self Care | Payer: PPO | Source: Ambulatory Visit | Attending: Obstetrics & Gynecology | Admitting: Obstetrics & Gynecology

## 2020-11-04 DIAGNOSIS — Z780 Asymptomatic menopausal state: Secondary | ICD-10-CM | POA: Diagnosis not present

## 2020-11-04 DIAGNOSIS — M8589 Other specified disorders of bone density and structure, multiple sites: Secondary | ICD-10-CM | POA: Diagnosis not present

## 2020-11-04 DIAGNOSIS — M8580 Other specified disorders of bone density and structure, unspecified site: Secondary | ICD-10-CM | POA: Insufficient documentation

## 2020-11-06 ENCOUNTER — Encounter: Payer: Self-pay | Admitting: Cardiology

## 2020-11-06 ENCOUNTER — Ambulatory Visit (HOSPITAL_COMMUNITY): Payer: PPO | Attending: Cardiology

## 2020-11-06 ENCOUNTER — Other Ambulatory Visit: Payer: Self-pay

## 2020-11-06 DIAGNOSIS — I472 Ventricular tachycardia: Secondary | ICD-10-CM | POA: Diagnosis not present

## 2020-11-06 DIAGNOSIS — Z8249 Family history of ischemic heart disease and other diseases of the circulatory system: Secondary | ICD-10-CM | POA: Insufficient documentation

## 2020-11-06 DIAGNOSIS — I341 Nonrheumatic mitral (valve) prolapse: Secondary | ICD-10-CM | POA: Insufficient documentation

## 2020-11-06 DIAGNOSIS — I351 Nonrheumatic aortic (valve) insufficiency: Secondary | ICD-10-CM | POA: Insufficient documentation

## 2020-11-06 DIAGNOSIS — I4729 Other ventricular tachycardia: Secondary | ICD-10-CM

## 2020-11-06 DIAGNOSIS — R011 Cardiac murmur, unspecified: Secondary | ICD-10-CM | POA: Insufficient documentation

## 2020-11-06 LAB — ECHOCARDIOGRAM COMPLETE
Area-P 1/2: 4.83 cm2
MV M vel: 6.03 m/s
MV Peak grad: 145.4 mmHg
P 1/2 time: 312 msec
S' Lateral: 2.83 cm

## 2020-11-07 ENCOUNTER — Ambulatory Visit: Payer: PPO | Admitting: Physical Therapy

## 2020-11-07 DIAGNOSIS — R252 Cramp and spasm: Secondary | ICD-10-CM

## 2020-11-07 DIAGNOSIS — M25512 Pain in left shoulder: Secondary | ICD-10-CM

## 2020-11-07 DIAGNOSIS — G8929 Other chronic pain: Secondary | ICD-10-CM

## 2020-11-07 DIAGNOSIS — M25552 Pain in left hip: Secondary | ICD-10-CM

## 2020-11-07 NOTE — Patient Instructions (Signed)
Access Code: [email or web address] URL: [email or web address] Date: 11/07/2020 Prepared by: Ruben Im  Program Notes Perform for both legs.   Exercises Bridge with Resistance - 1 x daily - 7 x weekly - 2 sets - 10 reps Clamshell with Resistance - 1 x daily - 7 x weekly - 2 sets - 10 reps Seated Hip Adduction Isometrics with Ball - 1 x daily - 7 x weekly - 2 sets - 10 reps Heel rises with counter support - 1 x daily - 7 x weekly - 2 sets - 10 reps Hooklying Transversus Abdominis Palpation - 1 x daily - 7 x weekly - 1 sets - 10 reps Hooklying Isometric Hip Flexion - 1 x daily - 7 x weekly - 1 sets - 10 reps Supine 90/90 Abdominal Bracing - 1 x daily - 7 x weekly - 5 reps Push ups on kitchen counter - 1 x daily - 7 x weekly - 2 sets - 10 reps Bent Over Single Arm Shoulder Row with Dumbbell - 1 x daily - 7 x weekly - 1 sets - 10 reps Standing Bent Over Single Arm Scapular Row with Table Support with PLB - 1 x daily - 7 x weekly - 1 sets - 10 reps Single Arm Bent Over Shoulder Extension with Dumbbell - 1 x daily - 7 x weekly - 1 sets - 10 reps Half Deadlift with Kettlebell - 1 x daily - 7 x weekly - 1 sets - 10 reps Standing Terminal Knee Extension at Wall with Ball - 1 x daily - 7 x weekly - 1 sets - 15 reps Forward Step Up with Counter Support - 1 x daily - 7 x weekly - 2 sets - 10 reps

## 2020-11-07 NOTE — Therapy (Signed)
[facility] [address] [place], [place], [address] Phone: [phone]   Fax:  [phone]  Physical Therapy Treatment  Patient Details  Name: Jean Smith MRN: [id number] Date of Birth: 07/22/1950 Referring Provider (PT): Pricilla Holm, MD   Encounter Date: 11/07/2020   PT End of Session - 11/07/20 0942    Visit Number 11    Number of Visits 24    Date for PT Re-Evaluation 12/03/20    Authorization Type [employer]    Progress Note Due on Visit 20    PT Start Time 0854   pt late   PT Stop Time 0932    PT Time Calculation (min) 38 min    Activity Tolerance Patient tolerated treatment well           Past Medical History:  Diagnosis Date  . Allergy   . Anxiety   . Aortic regurgitation    moderate by echo 10/2020  . Arthritis   . Cancer ([place])    skin  . Cataract    bilateral - MD is just watching   . Diverticulosis   . Fuchs' endothelial dystrophy    follows with optho regularly   . Gestational diabetes   . Heart murmur    MVP   . History of depression   . HSV-2 infection   . Hyperlipidemia    no meds - diet controlled  . Hyperplastic colon polyp   . Mitral valve prolapse    mild to moderate MR by echo 10/2020  . PVC's (premature ventricular contractions)    intol of BB, sxc palpitations r/t stress  . RVOT ventricular tachycardia/PVCs    EP eval 01/2013 for freq PVCs    Past Surgical History:  Procedure Laterality Date  . CESAREAN SECTION     x 3  . COLONOSCOPY    . KNEE SURGERY Right   . MANDIBLE SURGERY     right side in front of ear  . MOHS SURGERY  2021  . POLYPECTOMY    . WISDOM TOOTH EXTRACTION      There were no vitals filed for this visit.   Subjective Assessment - 11/07/20 0854    Subjective Feeling Ok.  Had a bone density test and from awkward positioning had shooting pains that night.  I think I have osteopenia.  I had an U/S of heart which showed no change.     Pertinent History arthritis, history of cancer, depression    Currently in Pain? No/denies    Pain Score 0-No pain                             [facility] Adult PT Treatment/Exercise - 11/07/20 0001      Exercises   Exercises --   discussion on the benefit of resistive training on health     Lumbar Exercises: Standing   Other Standing Lumbar Exercises leaning on table forehead on back of hand 3# rows 15x right/left    Other Standing Lumbar Exercises 2 3# partial dead lifts 10x      Knee/Hip Exercises: Aerobic   Nustep L1 7 min while discussing status/progress      Knee/Hip Exercises: Standing   Heel Raises Both;5 reps    Forward Step Up Right;Left;10 reps;2 sets;Hand Hold: 2;Step Height: 4"    Other Standing Knee Exercises sit to stand from 25 inches with UE punches 2 3# hand weights  Other Standing Knee Exercises dynamic warm up: high knees, butt kicks, front kicks, toe raise walks, hip circles, back scratches, touchdowns 3-5 reps each for a total of 5 min      Knee/Hip Exercises: Seated   Sit to Sand 2 sets;10 reps   mat table plus foam 22 inches     Shoulder Exercises: ROM/Strengthening   Pushups 20 reps    Pushups Limitations on counter                    PT Short Term Goals - 10/30/20 1516      PT SHORT TERM GOAL #1   Title Patient will be independent with HEP for continued progression at home.    Time 6    Period Weeks    Status Achieved    Target Date 10/22/20      PT SHORT TERM GOAL #2   Title Patient will improve shoulder flexion to 120 degrees bilaterally for improved overhead reaching.    Baseline Lt 102; Rt 95    Time 6    Period Weeks    Status Achieved   Lt 127/Rt 159   Target Date 10/22/20      PT SHORT TERM GOAL #3   Title Patient will report no more than 4/10 pain for two consecutive sessions to indicate improved UE activity tolerance to more readily complete ADLs    Time 6    Period Weeks    Status Achieved    previously achieved   Target Date 10/22/20             PT Long Term Goals - 10/30/20 1247      PT LONG TERM GOAL #2   Title Patient will improved FOTO score to 64 or higher to indicate improved overall function.    Time 12    Period Weeks    Status New      PT LONG TERM GOAL #5   Title Patient will complete five times sit to stand in <13s to indicate decreased fall risk    Baseline 22s    Time 9    Period Weeks    Status On-going   18.67 seconds                Plan - 11/07/20 0943    Clinical Impression Statement Discussion on the benefits of resistive training benefiting many health issues as well as pain modulation.  Verbal cues for patellofemoral alignment with sit to stand (knee valgus).  Decreased balance noted with dynamic warm up requiring UE touch to stabilize.  She reports HS discomfort during dynamic warm up but no back, knee or shoulder pain reported during treatment session.  Therapist monitoring response throughout treatment session.    Comorbidities arthritis, cancer, depression    Examination-Activity Limitations Carry;Caring for Others;Reach Overhead;Sleep    Rehab Potential Good    PT Frequency 2x / week    PT Duration 12 weeks    PT Treatment/Interventions ADLs/Self Care Home Management;Cryotherapy;Electrical Stimulation;Iontophoresis 4mg /ml Dexamethasone;Moist Heat;Traction;Therapeutic activities;Therapeutic exercise;Neuromuscular re-education;Patient/family education;Manual techniques;Passive range of motion;Dry needling;Taping;Spinal Manipulations;Joint Manipulations;Functional mobility training;Gait training;Balance training    PT Next Visit Plan continue postural and functional strengthening; focus on knee stabilizers    PT Home Exercise Plan Access Code: [email or web address]           Patient will benefit from skilled therapeutic intervention in order to improve the following deficits and impairments:  Decreased activity tolerance,Decreased range of  motion,Decreased strength,Hypomobility,Increased muscle spasms,Impaired UE functional  use,Postural dysfunction,Improper body mechanics,Pain  Visit Diagnosis: Cramp and spasm  Chronic left shoulder pain  Chronic right shoulder pain  Pain in left hip     Problem List Patient Active Problem List   Diagnosis Date Noted  . Mitral valve prolapse   . Aortic regurgitation   . NSVT (nonsustained ventricular tachycardia) ([place]) 09/25/2020  . Pre-diabetes 08/27/2020  . Chronic pain of both shoulders 05/05/2018  . Vitamin D deficiency 05/05/2018  . Other fatigue 01/02/2018  . Allergic rhinitis 01/02/2018  . Weight gain 01/02/2018  . PVC (premature ventricular contraction) 03/09/2017  . Routine general medical examination at a health care facility 09/01/2015  . Hyperlipidemia 09/01/2015  . Varicose vein 08/27/2014  . Primary localized osteoarthrosis, lower leg 10/30/2013  . Anxiety state 03/23/2013  . 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 Depression 01/01/2013  . Abnormal stress echo 12/11/2012  . Palpitations 09/14/2010   Ruben Im, PT 11/07/20 9:54 AM Phone: [phone] Fax: [phone] Alvera Singh 11/07/2020, 9:53 AM  [facility] [address], [place], [place], [address] Phone: [phone]   Fax:  [phone]  Name: Jean Smith MRN: [id number] Date of Birth: 1950/10/15

## 2020-11-11 ENCOUNTER — Ambulatory Visit: Payer: PPO | Admitting: Physical Therapy

## 2020-11-11 ENCOUNTER — Other Ambulatory Visit: Payer: Self-pay

## 2020-11-11 DIAGNOSIS — M6281 Muscle weakness (generalized): Secondary | ICD-10-CM

## 2020-11-11 DIAGNOSIS — M25551 Pain in right hip: Secondary | ICD-10-CM

## 2020-11-11 DIAGNOSIS — M25552 Pain in left hip: Secondary | ICD-10-CM

## 2020-11-11 DIAGNOSIS — R252 Cramp and spasm: Secondary | ICD-10-CM

## 2020-11-11 DIAGNOSIS — G8929 Other chronic pain: Secondary | ICD-10-CM

## 2020-11-11 DIAGNOSIS — M25511 Pain in right shoulder: Secondary | ICD-10-CM

## 2020-11-11 NOTE — Therapy (Signed)
[facility] [address], [place], [place], [address] Phone: [phone]   Fax:  [phone]  Physical Therapy Treatment  Patient Details  Name: Jean Smith MRN: [id number] Date of Birth: 17-Nov-1950 Referring Provider (PT): Pricilla Holm, MD   Encounter Date: 11/11/2020   PT End of Session - 11/11/20 1106    Visit Number 12    Number of Visits 24    Date for PT Re-Evaluation 12/03/20    Authorization Type [employer]    Progress Note Due on Visit 20    PT Start Time 0849    PT Stop Time 0928    PT Time Calculation (min) 39 min    Equipment Utilized During Treatment Gait belt    Activity Tolerance Patient tolerated treatment well;No increased pain    Behavior During Therapy WFL for tasks assessed/performed           Past Medical History:  Diagnosis Date  . Allergy   . Anxiety   . Aortic regurgitation    moderate by echo 10/2020  . Arthritis   . Cancer ([place])    skin  . Cataract    bilateral - MD is just watching   . Diverticulosis   . Fuchs' endothelial dystrophy    follows with optho regularly   . Gestational diabetes   . Heart murmur    MVP   . History of depression   . HSV-2 infection   . Hyperlipidemia    no meds - diet controlled  . Hyperplastic colon polyp   . Mitral valve prolapse    mild to moderate MR by echo 10/2020  . PVC's (premature ventricular contractions)    intol of BB, sxc palpitations r/t stress  . RVOT ventricular tachycardia/PVCs    EP eval 01/2013 for freq PVCs    Past Surgical History:  Procedure Laterality Date  . CESAREAN SECTION     x 3  . COLONOSCOPY    . KNEE SURGERY Right   . MANDIBLE SURGERY     right side in front of ear  . MOHS SURGERY  2021  . POLYPECTOMY    . WISDOM TOOTH EXTRACTION      There were no vitals filed for this visit.   Subjective Assessment - 11/11/20 0850    Subjective Feels that her legs are still weak. Strained her  shoulder but pain has since resolved.    Pertinent History arthritis, history of cancer, depression    Limitations House hold activities    How long can you sit comfortably? unlimited    How long can you stand comfortably? unlimited    How long can you walk comfortably? unlimited    Currently in Pain? No/denies                             [facility] Adult PT Treatment/Exercise - 11/11/20 0001      Neuro Re-ed    Neuro Re-ed Details  SLS with vectors: lateral; 45 degrees forward/back; x10 reps each Lt/Rt, blue loop at calves      Lumbar Exercises: Standing   Other Standing Lumbar Exercises leaning on table forehead on back of hand 4# rows 10x right/left    Other Standing Lumbar Exercises 2 4# partial dead lifts 10x      Knee/Hip Exercises: Aerobic   Recumbent Bike L3 x 5 minutes; PT present to monitor and discuss progress  Knee/Hip Exercises: Seated   Clamshell with TheraBand Blue   2 x 10 repetitions     Shoulder Exercises: Standing   Other Standing Exercises wall clocks; green loop; x8 reps Rt/Lt                  PT Education - 11/11/20 1104    Education Details wall clock with theraband, SLS with vectors    Person(s) Educated Patient    Methods Explanation;Demonstration;Tactile cues;Verbal cues;Handout    Comprehension Verbalized understanding;Returned demonstration;Verbal cues required;Tactile cues required            PT Short Term Goals - 10/30/20 1516      PT SHORT TERM GOAL #1   Title Patient will be independent with HEP for continued progression at home.    Time 6    Period Weeks    Status Achieved    Target Date 10/22/20      PT SHORT TERM GOAL #2   Title Patient will improve shoulder flexion to 120 degrees bilaterally for improved overhead reaching.    Baseline Lt 102; Rt 95    Time 6    Period Weeks    Status Achieved   Lt 127/Rt 159   Target Date 10/22/20      PT SHORT TERM GOAL #3   Title Patient will report no more than  4/10 pain for two consecutive sessions to indicate improved UE activity tolerance to more readily complete ADLs    Time 6    Period Weeks    Status Achieved   previously achieved   Target Date 10/22/20             PT Long Term Goals - 11/11/20 1105      PT LONG TERM GOAL #1   Title Patient will be independent with advanced HEP for long term management of symptoms post D/C.    Time 12    Period Weeks    Status On-going      PT LONG TERM GOAL #3   Title Patient will demonstrate 140 degrees flexion bilaterally for improved overhead reaching    Time 12    Period Weeks    Status On-going                 Plan - 11/11/20 1018    Clinical Impression Statement Patient reporting "clunk" sensation of Rt shoulder when performing dead lift into scapular squeeze exercise. Reporting feeling increased shoulder stability following introduction to "wall clock" scapular stabilization activity. Requiring frequent encouragement for positive self-assessment as patient frequently expresses negative opinions regarding current functional level despite performance during session. Responds well to positive encouragement. Would benefit from continued skilled intervention for improved functional mobility and improved perception of functional ability.    Personal Factors and Comorbidities Comorbidity 3+    Comorbidities arthritis, cancer, depression    Examination-Activity Limitations Carry;Caring for Others;Reach Overhead;Sleep    Examination-Participation Restrictions Meal Prep;Cleaning;Laundry    Rehab Potential Good    PT Frequency 2x / week    PT Duration 12 weeks    PT Treatment/Interventions ADLs/Self Care Home Management;Cryotherapy;Electrical Stimulation;Iontophoresis 4mg /ml Dexamethasone;Moist Heat;Traction;Therapeutic activities;Therapeutic exercise;Neuromuscular re-education;Patient/family education;Manual techniques;Passive range of motion;Dry needling;Taping;Spinal Manipulations;Joint  Manipulations;Functional mobility training;Gait training;Balance training    PT Next Visit Plan progress postural and functional strengthening to patient tolerance; shoulder stabilization    PT Home Exercise Plan Access Code: [email or web address]    Consulted and Agree with Plan of Care Patient  Patient will benefit from skilled therapeutic intervention in order to improve the following deficits and impairments:  Decreased activity tolerance,Decreased range of motion,Decreased strength,Hypomobility,Increased muscle spasms,Impaired UE functional use,Postural dysfunction,Improper body mechanics,Pain  Visit Diagnosis: Cramp and spasm  Chronic left shoulder pain  Chronic right shoulder pain  Pain in left hip  Pain in right hip  Chronic pain of left knee  Muscle weakness (generalized)     Problem List Patient Active Problem List   Diagnosis Date Noted  . Mitral valve prolapse   . Aortic regurgitation   . NSVT (nonsustained ventricular tachycardia) ([place]) 09/25/2020  . Pre-diabetes 08/27/2020  . Chronic pain of both shoulders 05/05/2018  . Vitamin D deficiency 05/05/2018  . Other fatigue 01/02/2018  . Allergic rhinitis 01/02/2018  . Weight gain 01/02/2018  . PVC (premature ventricular contraction) 03/09/2017  . Routine general medical examination at a health care facility 09/01/2015  . Hyperlipidemia 09/01/2015  . Varicose vein 08/27/2014  . Primary localized osteoarthrosis, lower leg 10/30/2013  . Anxiety state 03/23/2013  . 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 Depression 01/01/2013  . Abnormal stress echo 12/11/2012  . Palpitations 09/14/2010    Everardo All PT, DPT  11/11/20 11:07 AM    [facility] [address] [place], [place], [address] Phone: [phone]   Fax:  [phone]  Name: CERENITI CURB MRN: [id number] Date of Birth: May 04, 1951

## 2020-11-11 NOTE — Patient Instructions (Addendum)
Wall Clock with Theraband - 1 x daily - 7 x weekly - 2 sets - 8 reps

## 2020-11-12 ENCOUNTER — Encounter: Payer: PPO | Admitting: Physical Therapy

## 2020-11-14 ENCOUNTER — Ambulatory Visit: Payer: PPO | Attending: Internal Medicine | Admitting: Physical Therapy

## 2020-11-14 ENCOUNTER — Other Ambulatory Visit: Payer: Self-pay

## 2020-11-14 DIAGNOSIS — M25552 Pain in left hip: Secondary | ICD-10-CM | POA: Insufficient documentation

## 2020-11-14 DIAGNOSIS — M25512 Pain in left shoulder: Secondary | ICD-10-CM | POA: Diagnosis not present

## 2020-11-14 DIAGNOSIS — R252 Cramp and spasm: Secondary | ICD-10-CM | POA: Insufficient documentation

## 2020-11-14 DIAGNOSIS — M25511 Pain in right shoulder: Secondary | ICD-10-CM | POA: Diagnosis not present

## 2020-11-14 DIAGNOSIS — G8929 Other chronic pain: Secondary | ICD-10-CM | POA: Diagnosis not present

## 2020-11-14 DIAGNOSIS — M25551 Pain in right hip: Secondary | ICD-10-CM | POA: Diagnosis not present

## 2020-11-14 DIAGNOSIS — M6281 Muscle weakness (generalized): Secondary | ICD-10-CM | POA: Diagnosis not present

## 2020-11-14 DIAGNOSIS — M25562 Pain in left knee: Secondary | ICD-10-CM | POA: Insufficient documentation

## 2020-11-14 DIAGNOSIS — M25561 Pain in right knee: Secondary | ICD-10-CM | POA: Insufficient documentation

## 2020-11-14 NOTE — Therapy (Signed)
[facility] [address] [place], [place], [address] Phone: [phone]   Fax:  [phone]  Physical Therapy Treatment  Patient Details  Name: Jean Smith MRN: [id number] Date of Birth: 04/01/1951 Referring Provider (PT): Pricilla Holm, MD   Encounter Date: 11/14/2020   PT End of Session - 11/14/20 1207    Visit Number 13    Number of Visits 24    Date for PT Re-Evaluation 12/03/20    Authorization Type [employer]    Progress Note Due on Visit 20    PT Start Time 1019    PT Stop Time 1100    PT Time Calculation (min) 41 min    Activity Tolerance Patient tolerated treatment well;No increased pain    Behavior During Therapy WFL for tasks assessed/performed           Past Medical History:  Diagnosis Date  . Allergy   . Anxiety   . Aortic regurgitation    moderate by echo 10/2020  . Arthritis   . Cancer ([place])    skin  . Cataract    bilateral - MD is just watching   . Diverticulosis   . Fuchs' endothelial dystrophy    follows with optho regularly   . Gestational diabetes   . Heart murmur    MVP   . History of depression   . HSV-2 infection   . Hyperlipidemia    no meds - diet controlled  . Hyperplastic colon polyp   . Mitral valve prolapse    mild to moderate MR by echo 10/2020  . PVC's (premature ventricular contractions)    intol of BB, sxc palpitations r/t stress  . RVOT ventricular tachycardia/PVCs    EP eval 01/2013 for freq PVCs    Past Surgical History:  Procedure Laterality Date  . CESAREAN SECTION     x 3  . COLONOSCOPY    . KNEE SURGERY Right   . MANDIBLE SURGERY     right side in front of ear  . MOHS SURGERY  2021  . POLYPECTOMY    . WISDOM TOOTH EXTRACTION      There were no vitals filed for this visit.   Subjective Assessment - 11/14/20 1205    Subjective Has a lot of stress at home currently.    Pertinent History arthritis, history of cancer, depression     Limitations House hold activities    How long can you sit comfortably? unlimited    How long can you stand comfortably? unlimited    How long can you walk comfortably? unlimited    Diagnostic tests None    Currently in Pain? No/denies                             [facility] Adult PT Treatment/Exercise - 11/14/20 0001      Neuro Re-ed    Neuro Re-ed Details  SLS with vectors: lateral; 45 degrees forward/back; x10 reps each Lt/Rt, blue loop at calves      Lumbar Exercises: Standing   Functional Squats 10 reps    Functional Squats Limitations hand hold x 2    Other Standing Lumbar Exercises leaning on table forehead on back of hand 4# rows 2 x 12 right/left    Other Standing Lumbar Exercises 2 4# partial dead lifts 2 x 10      Knee/Hip Exercises: Aerobic   Recumbent Bike L1 x 8 minutes: PT  present to discuss progress and response to exercise                    PT Short Term Goals - 10/30/20 1516      PT SHORT TERM GOAL #1   Title Patient will be independent with HEP for continued progression at home.    Time 6    Period Weeks    Status Achieved    Target Date 10/22/20      PT SHORT TERM GOAL #2   Title Patient will improve shoulder flexion to 120 degrees bilaterally for improved overhead reaching.    Baseline Lt 102; Rt 95    Time 6    Period Weeks    Status Achieved   Lt 127/Rt 159   Target Date 10/22/20      PT SHORT TERM GOAL #3   Title Patient will report no more than 4/10 pain for two consecutive sessions to indicate improved UE activity tolerance to more readily complete ADLs    Time 6    Period Weeks    Status Achieved   previously achieved   Target Date 10/22/20             PT Long Term Goals - 11/14/20 1207      PT LONG TERM GOAL #1   Title Patient will be independent with advanced HEP for long term management of symptoms post D/C.    Time 12    Period Weeks    Status On-going      PT LONG TERM GOAL #4   Title Patient will  raise/lower 4# from waist to overhead height without increased pain for improved ability to complete ADLs.    Time 12    Period Weeks    Status On-going                 Plan - 11/14/20 1205    Clinical Impression Statement Patient reporting good tolerance of introduction to supported squat exercise. Therapist noting improved strength of postural musculature as patient requiring minimal cuing for form when performing row exercise. Would benefit from continued skilled intervention for improved functional activity tolerance.    Personal Factors and Comorbidities Comorbidity 3+    Comorbidities arthritis, cancer, depression    Examination-Activity Limitations Carry;Caring for Others;Reach Overhead;Sleep    Examination-Participation Restrictions Meal Prep;Cleaning;Laundry    Rehab Potential Good    PT Frequency 2x / week    PT Duration 12 weeks    PT Treatment/Interventions ADLs/Self Care Home Management;Cryotherapy;Electrical Stimulation;Iontophoresis 4mg /ml Dexamethasone;Moist Heat;Traction;Therapeutic activities;Therapeutic exercise;Neuromuscular re-education;Patient/family education;Manual techniques;Passive range of motion;Dry needling;Taping;Spinal Manipulations;Joint Manipulations;Functional mobility training;Gait training;Balance training    PT Next Visit Plan progress functional strengthening    PT Home Exercise Plan Access Code: [email or web address]    Consulted and Agree with Plan of Care Patient           Patient will benefit from skilled therapeutic intervention in order to improve the following deficits and impairments:  Decreased activity tolerance,Decreased range of motion,Decreased strength,Hypomobility,Increased muscle spasms,Impaired UE functional use,Postural dysfunction,Improper body mechanics,Pain  Visit Diagnosis: Cramp and spasm  Chronic left shoulder pain  Chronic right shoulder pain  Pain in left hip  Pain in right hip  Chronic pain of left knee  Muscle  weakness (generalized)     Problem List Patient Active Problem List   Diagnosis Date Noted  . Mitral valve prolapse   . Aortic regurgitation   . NSVT (nonsustained ventricular tachycardia) ([place]) 09/25/2020  . Pre-diabetes  08/27/2020  . Chronic pain of both shoulders 05/05/2018  . Vitamin D deficiency 05/05/2018  . Other fatigue 01/02/2018  . Allergic rhinitis 01/02/2018  . Weight gain 01/02/2018  . PVC (premature ventricular contraction) 03/09/2017  . Routine general medical examination at a health care facility 09/01/2015  . Hyperlipidemia 09/01/2015  . Varicose vein 08/27/2014  . Primary localized osteoarthrosis, lower leg 10/30/2013  . Anxiety state 03/23/2013  . 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 Depression 01/01/2013  . Abnormal stress echo 12/11/2012  . Palpitations 09/14/2010    Everardo All PT, DPT  11/14/20 12:08 PM    [facility] [address], [place], [place], [address] Phone: [phone]   Fax:  [phone]  Name: Jean Smith MRN: [id number] Date of Birth: 12-17-1950

## 2020-11-17 ENCOUNTER — Ambulatory Visit: Payer: PPO | Admitting: Cardiology

## 2020-11-18 ENCOUNTER — Other Ambulatory Visit: Payer: Self-pay

## 2020-11-18 ENCOUNTER — Ambulatory Visit: Payer: PPO | Admitting: Physical Therapy

## 2020-11-18 DIAGNOSIS — M6281 Muscle weakness (generalized): Secondary | ICD-10-CM

## 2020-11-18 DIAGNOSIS — M25512 Pain in left shoulder: Secondary | ICD-10-CM

## 2020-11-18 DIAGNOSIS — M25552 Pain in left hip: Secondary | ICD-10-CM

## 2020-11-18 DIAGNOSIS — R252 Cramp and spasm: Secondary | ICD-10-CM

## 2020-11-18 DIAGNOSIS — G8929 Other chronic pain: Secondary | ICD-10-CM

## 2020-11-18 DIAGNOSIS — M25551 Pain in right hip: Secondary | ICD-10-CM

## 2020-11-18 DIAGNOSIS — M25562 Pain in left knee: Secondary | ICD-10-CM

## 2020-11-18 NOTE — Therapy (Signed)
[facility] [address] [place], [place], [address] Phone: [phone]   Fax:  [phone]  Physical Therapy Treatment  Patient Details  Name: Jean Smith MRN: [id number] Date of Birth: 22-Jun-1950 Referring Provider (PT): Pricilla Holm, MD   Encounter Date: 11/18/2020   PT End of Session - 11/18/20 1705    Visit Number 14    Date for PT Re-Evaluation 12/03/20    Authorization Type [employer]    Progress Note Due on Visit 20    PT Start Time 4825    PT Stop Time 1531    PT Time Calculation (min) 42 min    Activity Tolerance Patient tolerated treatment well;No increased pain    Behavior During Therapy WFL for tasks assessed/performed           Past Medical History:  Diagnosis Date  . Allergy   . Anxiety   . Aortic regurgitation    moderate by echo 10/2020  . Arthritis   . Cancer ([place])    skin  . Cataract    bilateral - MD is just watching   . Diverticulosis   . Fuchs' endothelial dystrophy    follows with optho regularly   . Gestational diabetes   . Heart murmur    MVP   . History of depression   . HSV-2 infection   . Hyperlipidemia    no meds - diet controlled  . Hyperplastic colon polyp   . Mitral valve prolapse    mild to moderate MR by echo 10/2020  . PVC's (premature ventricular contractions)    intol of BB, sxc palpitations r/t stress  . RVOT ventricular tachycardia/PVCs    EP eval 01/2013 for freq PVCs    Past Surgical History:  Procedure Laterality Date  . CESAREAN SECTION     x 3  . COLONOSCOPY    . KNEE SURGERY Right   . MANDIBLE SURGERY     right side in front of ear  . MOHS SURGERY  2021  . POLYPECTOMY    . WISDOM TOOTH EXTRACTION      There were no vitals filed for this visit.   Subjective Assessment - 11/18/20 1702    Subjective Has no new concerns. Just feels tired.    Pertinent History arthritis, history of cancer, depression    Limitations House  hold activities    How long can you sit comfortably? unlimited    How long can you stand comfortably? unlimited    How long can you walk comfortably? unlimited    Diagnostic tests None    Currently in Pain? No/denies                             [facility] Adult PT Treatment/Exercise - 11/18/20 0001      Lumbar Exercises: Standing   Other Standing Lumbar Exercises 2 5#; partial dead lifts; x15      Knee/Hip Exercises: Aerobic   Recumbent Bike L1 x 8 minutes: PT present to discuss progress and response to exercise      Knee/Hip Exercises: Standing   Other Standing Knee Exercises resisted walking at power tower; 20lbs; all directions x 3 trips      Shoulder Exercises: Standing   Flexion Both    Shoulder Flexion Weight (lbs) 1    Flexion Limitations 2 x 10 repetitions seated on BOSU    Diagonals Weight (lbs) 1#; scaption; x10 reps seated on  BOSU    Other Standing Exercises waist to eye level shelf reach; 2lbs; x10 Lt/Rt    Other Standing Exercises forearm slides for serratus; x10 reps                    PT Short Term Goals - 10/30/20 1516      PT SHORT TERM GOAL #1   Title Patient will be independent with HEP for continued progression at home.    Time 6    Period Weeks    Status Achieved    Target Date 10/22/20      PT SHORT TERM GOAL #2   Title Patient will improve shoulder flexion to 120 degrees bilaterally for improved overhead reaching.    Baseline Lt 102; Rt 95    Time 6    Period Weeks    Status Achieved   Lt 127/Rt 159   Target Date 10/22/20      PT SHORT TERM GOAL #3   Title Patient will report no more than 4/10 pain for two consecutive sessions to indicate improved UE activity tolerance to more readily complete ADLs    Time 6    Period Weeks    Status Achieved   previously achieved   Target Date 10/22/20             PT Long Term Goals - 11/18/20 1704      PT LONG TERM GOAL #1   Title Patient will be independent with advanced  HEP for long term management of symptoms post D/C.    Time 12    Period Weeks    Status On-going      PT LONG TERM GOAL #4   Title Patient will raise/lower 4# from waist to overhead height without increased pain for improved ability to complete ADLs.    Time 12    Period Weeks    Status On-going   2lb to overhead                Plan - 11/18/20 1702    Clinical Impression Statement Patient demonstrates improved UE activity tolerance as she was able to raise 2lbs to overhead level shelf using Rt UE without increased pain. Reporting increased Lt UE pain with same activity indicating need for continued scapular mechanics training and strengthening. Intermittent CGA provided during resisted walking activity due to impaired eccentric abduction/adduction of bil LE. Wouldb benefit from continued skilled intervention to address impairments for improved functional activity tolerance and improved functional use of bil UE.    Personal Factors and Comorbidities Comorbidity 3+    Comorbidities arthritis, cancer, depression    Examination-Activity Limitations Carry;Caring for Others;Reach Overhead;Sleep    Examination-Participation Restrictions Meal Prep;Cleaning;Laundry    Rehab Potential Good    PT Frequency 2x / week    PT Duration 12 weeks    PT Treatment/Interventions ADLs/Self Care Home Management;Cryotherapy;Electrical Stimulation;Iontophoresis 4mg /ml Dexamethasone;Moist Heat;Traction;Therapeutic activities;Therapeutic exercise;Neuromuscular re-education;Patient/family education;Manual techniques;Passive range of motion;Dry needling;Taping;Spinal Manipulations;Joint Manipulations;Functional mobility training;Gait training;Balance training    PT Next Visit Plan continue to progress functional strengthening; continue functional reaching and lifting    PT Home Exercise Plan Access Code: [email or web address]    Consulted and Agree with Plan of Care Patient           Patient will benefit from skilled  therapeutic intervention in order to improve the following deficits and impairments:  Decreased activity tolerance,Decreased range of motion,Decreased strength,Hypomobility,Increased muscle spasms,Impaired UE functional use,Postural dysfunction,Improper body mechanics,Pain  Visit Diagnosis: Cramp and  spasm  Chronic left shoulder pain  Chronic right shoulder pain  Pain in left hip  Pain in right hip  Chronic pain of left knee  Muscle weakness (generalized)     Problem List Patient Active Problem List   Diagnosis Date Noted  . Mitral valve prolapse   . Aortic regurgitation   . NSVT (nonsustained ventricular tachycardia) ([place]) 09/25/2020  . Pre-diabetes 08/27/2020  . Chronic pain of both shoulders 05/05/2018  . Vitamin D deficiency 05/05/2018  . Other fatigue 01/02/2018  . Allergic rhinitis 01/02/2018  . Weight gain 01/02/2018  . PVC (premature ventricular contraction) 03/09/2017  . Routine general medical examination at a health care facility 09/01/2015  . Hyperlipidemia 09/01/2015  . Varicose vein 08/27/2014  . Primary localized osteoarthrosis, lower leg 10/30/2013  . Anxiety state 03/23/2013  . 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 Depression 01/01/2013  . Abnormal stress echo 12/11/2012  . Palpitations 09/14/2010   Everardo All PT, DPT  11/18/20 5:07 PM    [facility] [address] [place], [place], [address] Phone: [phone]   Fax:  [phone]  Name: GENNETT GARCIA MRN: [id number] Date of Birth: 05-Jun-1951

## 2020-11-20 ENCOUNTER — Other Ambulatory Visit: Payer: Self-pay

## 2020-11-20 ENCOUNTER — Ambulatory Visit: Payer: PPO | Admitting: Physical Therapy

## 2020-11-20 DIAGNOSIS — M25562 Pain in left knee: Secondary | ICD-10-CM

## 2020-11-20 DIAGNOSIS — M25551 Pain in right hip: Secondary | ICD-10-CM

## 2020-11-20 DIAGNOSIS — G8929 Other chronic pain: Secondary | ICD-10-CM

## 2020-11-20 DIAGNOSIS — R252 Cramp and spasm: Secondary | ICD-10-CM | POA: Diagnosis not present

## 2020-11-20 DIAGNOSIS — M25552 Pain in left hip: Secondary | ICD-10-CM

## 2020-11-20 DIAGNOSIS — M6281 Muscle weakness (generalized): Secondary | ICD-10-CM

## 2020-11-20 NOTE — Therapy (Signed)
[facility] [address], [place], [place], [address] Phone: [phone]   Fax:  [phone]  Physical Therapy Treatment  Patient Details  Name: Jean Smith MRN: [id number] Date of Birth: 12/02/1950 Referring Provider (PT): Pricilla Holm, MD   Encounter Date: 11/20/2020   PT End of Session - 11/20/20 1017     Visit Number 15    Date for PT Re-Evaluation 12/03/20    Authorization Type [employer]    Progress Note Due on Visit 46    PT Start Time 0846    PT Stop Time 0927    PT Time Calculation (min) 41 min             Past Medical History:  Diagnosis Date   Allergy    Anxiety    Aortic regurgitation    moderate by echo 10/2020   Arthritis    Cancer ([place])    skin   Cataract    bilateral - MD is just watching    Diverticulosis    Fuchs' endothelial dystrophy    follows with optho regularly    Gestational diabetes    Heart murmur    MVP    History of depression    HSV-2 infection    Hyperlipidemia    no meds - diet controlled   Hyperplastic colon polyp    Mitral valve prolapse    mild to moderate MR by echo 10/2020   PVC's (premature ventricular contractions)    intol of BB, sxc palpitations r/t stress   RVOT ventricular tachycardia/PVCs    EP eval 01/2013 for freq PVCs    Past Surgical History:  Procedure Laterality Date   CESAREAN SECTION     x 3   COLONOSCOPY     KNEE SURGERY Right    MANDIBLE SURGERY     right side in front of ear   MOHS SURGERY  2021   POLYPECTOMY     WISDOM TOOTH EXTRACTION      There were no vitals filed for this visit.   Subjective Assessment - 11/20/20 0929     Subjective Doesn't feel pain with activity anymore. Just feels weak.    Pertinent History arthritis, history of cancer, depression    Limitations House hold activities    How long can you sit comfortably? unlimited    How long can you stand comfortably? unlimited    How long can you  walk comfortably? unlimited    Diagnostic tests None    Currently in Pain? No/denies                [facility] PT Assessment - 11/20/20 0001       Transfers   Five time sit to stand comments  17.3                           [facility] Adult PT Treatment/Exercise - 11/20/20 0001       Lumbar Exercises: Standing   Functional Squats 10 reps    Functional Squats Limitations hand hold x2; partial ROM    Other Standing Lumbar Exercises box pull with rope at mat table level; 15lbs in box; x6 repetitions      Knee/Hip Exercises: Aerobic   Recumbent Bike L2 x 6 minutes; PT present to discuss progress and assess response to activity      Knee/Hip Exercises: Standing   Other Standing Knee Exercises resisted walking at power tower; 20lbs; all  directions x 3 trips      Shoulder Exercises: Standing   Other Standing Exercises waist to eye level shelf reach; 2lbs; x10 Lt/Rt    Other Standing Exercises wall clocks; green loop; x10 Rt/Lt      Shoulder Exercises: ROM/Strengthening   Lat Pull 20 reps    Lat Pull Limitations 20 lbs                      PT Short Term Goals - 10/30/20 1516       PT SHORT TERM GOAL #1   Title Patient will be independent with HEP for continued progression at home.    Time 6    Period Weeks    Status Achieved    Target Date 10/22/20      PT SHORT TERM GOAL #2   Title Patient will improve shoulder flexion to 120 degrees bilaterally for improved overhead reaching.    Baseline Lt 102; Rt 95    Time 6    Period Weeks    Status Achieved   Lt 127/Rt 159   Target Date 10/22/20      PT SHORT TERM GOAL #3   Title Patient will report no more than 4/10 pain for two consecutive sessions to indicate improved UE activity tolerance to more readily complete ADLs    Time 6    Period Weeks    Status Achieved   previously achieved   Target Date 10/22/20               PT Long Term Goals - 11/20/20 1017       PT LONG TERM GOAL #1    Title Patient will be independent with advanced HEP for long term management of symptoms post D/C.    Time 12    Period Weeks    Status On-going      PT LONG TERM GOAL #4   Title Patient will raise/lower 4# from waist to overhead height without increased pain for improved ability to complete ADLs.    Time 12    Period Weeks    Status On-going      PT LONG TERM GOAL #5   Title Patient will complete five times sit to stand in <13s to indicate decreased fall risk    Baseline 22s    Time 9    Status On-going   17.3 seconds                  Plan - 11/20/20 0930     Clinical Impression Statement Patient demonstrates continued impaired eccentric quad and eccentric abductor/adductor control of bil LE as she intermittent requires tactile stabilization when performing resisted stepping activity. Functional mobility improved as five times sit to stand time decreased to 17.3 seconds. However, deficits continue to persist as five times sit to stand time continues to indicate increased fall risk and transfers continues to be limited by knee pain. Would benefit from continued skilled intervention to address impairments for improved functional mobility.    Personal Factors and Comorbidities Comorbidity 3+    Comorbidities arthritis, cancer, depression    Examination-Activity Limitations Carry;Caring for Others;Reach Overhead;Sleep    Examination-Participation Restrictions Meal Prep;Cleaning;Laundry    Rehab Potential Good    PT Frequency 2x / week    PT Duration 12 weeks    PT Treatment/Interventions ADLs/Self Care Home Management;Cryotherapy;Electrical Stimulation;Iontophoresis 4mg /ml Dexamethasone;Moist Heat;Traction;Therapeutic activities;Therapeutic exercise;Neuromuscular re-education;Patient/family education;Manual techniques;Passive range of motion;Dry needling;Taping;Spinal Manipulations;Joint Manipulations;Functional mobility training;Gait training;Balance training  PT Next Visit  Plan add knee stabilizer strengthening; continue functional training with focus on global strength    PT Home Exercise Plan Access Code: [email or web address]    Consulted and Agree with Plan of Care Patient             Patient will benefit from skilled therapeutic intervention in order to improve the following deficits and impairments:  Decreased activity tolerance, Decreased range of motion, Decreased strength, Hypomobility, Increased muscle spasms, Impaired UE functional use, Postural dysfunction, Improper body mechanics, Pain  Visit Diagnosis: Cramp and spasm  Chronic left shoulder pain  Chronic right shoulder pain  Pain in left hip  Pain in right hip  Chronic pain of left knee  Muscle weakness (generalized)     Problem List Patient Active Problem List   Diagnosis Date Noted   Mitral valve prolapse    Aortic regurgitation    NSVT (nonsustained ventricular tachycardia) ([place]) 09/25/2020   Pre-diabetes 08/27/2020   Chronic pain of both shoulders 05/05/2018   Vitamin D deficiency 05/05/2018   Other fatigue 01/02/2018   Allergic rhinitis 01/02/2018   Weight gain 01/02/2018   PVC (premature ventricular contraction) 03/09/2017   Routine general medical examination at a health care facility 09/01/2015   Hyperlipidemia 09/01/2015   Varicose vein 08/27/2014   Primary localized osteoarthrosis, lower leg 10/30/2013   Anxiety state 03/23/2013   RVOT ventricular tachycardia/PVCs 01/31/2013   Depression 01/01/2013   Abnormal stress echo 12/11/2012   Palpitations 09/14/2010   Everardo All PT, DPT  11/20/20 10:19 AM    [facility] [address], [place], [place], [address] Phone: [phone]   Fax:  [phone]  Name: JANERA PEUGH MRN: [id number] Date of Birth: [birth year]

## 2020-11-26 ENCOUNTER — Ambulatory Visit: Payer: PPO | Admitting: Physical Therapy

## 2020-11-26 ENCOUNTER — Other Ambulatory Visit: Payer: Self-pay

## 2020-11-26 DIAGNOSIS — M25552 Pain in left hip: Secondary | ICD-10-CM

## 2020-11-26 DIAGNOSIS — M25551 Pain in right hip: Secondary | ICD-10-CM

## 2020-11-26 DIAGNOSIS — M6281 Muscle weakness (generalized): Secondary | ICD-10-CM

## 2020-11-26 DIAGNOSIS — R252 Cramp and spasm: Secondary | ICD-10-CM | POA: Diagnosis not present

## 2020-11-26 DIAGNOSIS — G8929 Other chronic pain: Secondary | ICD-10-CM

## 2020-11-26 DIAGNOSIS — M25512 Pain in left shoulder: Secondary | ICD-10-CM

## 2020-11-26 NOTE — Therapy (Signed)
[facility] [address], [place], [place], [address] Phone: [phone]   Fax:  [phone]  Physical Therapy Treatment  Patient Details  Name: Jean Smith MRN: [id number] Date of Birth: 1950/07/27 Referring Provider (PT): Pricilla Holm, MD   Encounter Date: 11/26/2020   PT End of Session - 11/26/20 1529     Visit Number 16    Date for PT Re-Evaluation 12/03/20    Authorization Type [employer]    Progress Note Due on Visit 20    PT Start Time 1446    PT Stop Time 4742    PT Time Calculation (min) 41 min    Activity Tolerance Patient tolerated treatment well;No increased pain    Behavior During Therapy [facility] for tasks assessed/performed             Past Medical History:  Diagnosis Date   Allergy    Anxiety    Aortic regurgitation    moderate by echo 10/2020   Arthritis    Cancer ([place])    skin   Cataract    bilateral - MD is just watching    Diverticulosis    Fuchs' endothelial dystrophy    follows with optho regularly    Gestational diabetes    Heart murmur    MVP    History of depression    HSV-2 infection    Hyperlipidemia    no meds - diet controlled   Hyperplastic colon polyp    Mitral valve prolapse    mild to moderate MR by echo 10/2020   PVC's (premature ventricular contractions)    intol of BB, sxc palpitations r/t stress   RVOT ventricular tachycardia/PVCs    EP eval 01/2013 for freq PVCs    Past Surgical History:  Procedure Laterality Date   CESAREAN SECTION     x 3   COLONOSCOPY     KNEE SURGERY Right    MANDIBLE SURGERY     right side in front of ear   MOHS SURGERY  2021   POLYPECTOMY     WISDOM TOOTH EXTRACTION      There were no vitals filed for this visit.   Subjective Assessment - 11/26/20 1452     Subjective Feels tired and fatigued.    Pertinent History arthritis, history of cancer, depression    Limitations House hold activities    How long  can you sit comfortably? unlimited    How long can you stand comfortably? unlimited    How long can you walk comfortably? unlimited    Diagnostic tests None    Currently in Pain? No/denies                               [facility] Adult PT Treatment/Exercise - 11/26/20 0001       High Level Balance   High Level Balance Activities Side stepping;Negotiating over obstacles    High Level Balance Comments side stepping over hurdles; forward stepping over hurdles; reciprocal stepping over hurdles      Lumbar Exercises: Standing   Functional Squats 10 reps    Functional Squats Limitations hand hold x2; partial ROM      Knee/Hip Exercises: Standing   SLS with Vectors with slider into abduction and extension; x7 reps Lt/Rt    Other Standing Knee Exercises resisted walking at power tower; 20lbs; all directions x4 trips      Knee/Hip Exercises: Seated  [employer] Both;1 set;10 reps    [employer] Limitations with ball squeeze for [employer]      Knee/Hip Exercises: Sidelying   Hip ABduction Right;Left;1 set;10 reps    Hip ABduction Limitations tactile cues for decreased hip flexion                      PT Short Term Goals - 10/30/20 1516       PT SHORT TERM GOAL #1   Title Patient will be independent with HEP for continued progression at home.    Time 6    Period Weeks    Status Achieved    Target Date 10/22/20      PT SHORT TERM GOAL #2   Title Patient will improve shoulder flexion to 120 degrees bilaterally for improved overhead reaching.    Baseline Lt 102; Rt 95    Time 6    Period Weeks    Status Achieved   Lt 127/Rt 159   Target Date 10/22/20      PT SHORT TERM GOAL #3   Title Patient will report no more than 4/10 pain for two consecutive sessions to indicate improved UE activity tolerance to more readily complete ADLs    Time 6    Period Weeks    Status Achieved   previously achieved   Target Date 10/22/20               PT Long  Term Goals - 11/26/20 1529       PT LONG TERM GOAL #1   Title Patient will be independent with advanced HEP for long term management of symptoms post D/C.    Time 12    Period Weeks    Status On-going      PT LONG TERM GOAL #2   Title Patient will improved FOTO score to 64 or higher to indicate improved overall function.    Time 12    Period Weeks    Status On-going      PT LONG TERM GOAL #3   Title Patient will demonstrate 140 degrees flexion bilaterally for improved overhead reaching    Time 12    Period Weeks    Status On-going      PT LONG TERM GOAL #4   Title Patient will raise/lower 4# from waist to overhead height without increased pain for improved ability to complete ADLs.    Time 12    Period Weeks    Status On-going      PT LONG TERM GOAL #5   Title Patient will complete five times sit to stand in <13s to indicate decreased fall risk    Baseline 22s    Time 9    Period Weeks    Status On-going                   Plan - 11/26/20 1526     Clinical Impression Statement Patient demonstrates improved hip and core strength as she completed resisted walking activity without need for CGA. Noting increased signs of fatigue when performing LAQ exercise indicating VMO weakness which may be contributing to knee pain. Mild knee buckling when performing hurdle step over indicating continued functional quad strength impairments. Would benefit from continued skilled intervention to address impairments for improved functional mobility.    Personal Factors and Comorbidities Comorbidity 3+    Comorbidities arthritis, cancer, depression    Examination-Activity Limitations Carry;Caring for Others;Reach Overhead;Sleep    Examination-Participation  Restrictions Meal Prep;Cleaning;Laundry    Rehab Potential Good    PT Frequency 2x / week    PT Duration 12 weeks    PT Treatment/Interventions ADLs/Self Care Home Management;Cryotherapy;Electrical Stimulation;Iontophoresis 4mg /ml  Dexamethasone;Moist Heat;Traction;Therapeutic activities;Therapeutic exercise;Neuromuscular re-education;Patient/family education;Manual techniques;Passive range of motion;Dry needling;Taping;Spinal Manipulations;Joint Manipulations;Functional mobility training;Gait training;Balance training    PT Next Visit Plan continue knee stabilizer strengthening; continue global strengthening    PT Home Exercise Plan Access Code: [email or web address]    Consulted and Agree with Plan of Care Patient             Patient will benefit from skilled therapeutic intervention in order to improve the following deficits and impairments:  Decreased activity tolerance, Decreased range of motion, Decreased strength, Hypomobility, Increased muscle spasms, Impaired UE functional use, Postural dysfunction, Improper body mechanics, Pain  Visit Diagnosis: Cramp and spasm  Chronic left shoulder pain  Chronic right shoulder pain  Pain in left hip  Pain in right hip  Chronic pain of left knee  Muscle weakness (generalized)     Problem List Patient Active Problem List   Diagnosis Date Noted   Mitral valve prolapse    Aortic regurgitation    NSVT (nonsustained ventricular tachycardia) ([place]) 09/25/2020   Pre-diabetes 08/27/2020   Chronic pain of both shoulders 05/05/2018   Vitamin D deficiency 05/05/2018   Other fatigue 01/02/2018   Allergic rhinitis 01/02/2018   Weight gain 01/02/2018   PVC (premature ventricular contraction) 03/09/2017   Routine general medical examination at a health care facility 09/01/2015   Hyperlipidemia 09/01/2015   Varicose vein 08/27/2014   Primary localized osteoarthrosis, lower leg 10/30/2013   Anxiety state 03/23/2013   RVOT ventricular tachycardia/PVCs 01/31/2013   Depression 01/01/2013   Abnormal stress echo 12/11/2012   Palpitations 09/14/2010   Everardo All PT, DPT  11/26/20 3:31 PM    [facility] [address],  [place], [place], [address] Phone: [phone]   Fax:  [phone]  Name: Jean Smith MRN: [id number] Date of Birth: 07-04-1950

## 2020-11-28 ENCOUNTER — Other Ambulatory Visit: Payer: Self-pay

## 2020-11-28 ENCOUNTER — Ambulatory Visit: Payer: PPO | Admitting: Physical Therapy

## 2020-11-28 DIAGNOSIS — M25512 Pain in left shoulder: Secondary | ICD-10-CM

## 2020-11-28 DIAGNOSIS — G8929 Other chronic pain: Secondary | ICD-10-CM

## 2020-11-28 DIAGNOSIS — M25511 Pain in right shoulder: Secondary | ICD-10-CM

## 2020-11-28 DIAGNOSIS — R252 Cramp and spasm: Secondary | ICD-10-CM

## 2020-11-28 DIAGNOSIS — M25551 Pain in right hip: Secondary | ICD-10-CM

## 2020-11-28 DIAGNOSIS — M25552 Pain in left hip: Secondary | ICD-10-CM

## 2020-11-28 NOTE — Therapy (Signed)
[facility] [address], [place], [place], [address] Phone: [phone]   Fax:  [phone]  Physical Therapy Treatment  Patient Details  Name: Jean Smith MRN: [id number] Date of Birth: Jul 28, 1950 Referring Provider (PT): Pricilla Holm, MD   Encounter Date: 11/28/2020   PT End of Session - 11/28/20 1150     Visit Number 17    Number of Visits 24    Date for PT Re-Evaluation 12/03/20    Authorization Type [employer]    Progress Note Due on Visit 20    PT Start Time 1105    PT Stop Time 1145    PT Time Calculation (min) 40 min    Activity Tolerance Patient tolerated treatment well;No increased pain             Past Medical History:  Diagnosis Date   Allergy    Anxiety    Aortic regurgitation    moderate by echo 10/2020   Arthritis    Cancer ([place])    skin   Cataract    bilateral - MD is just watching    Diverticulosis    Fuchs' endothelial dystrophy    follows with optho regularly    Gestational diabetes    Heart murmur    MVP    History of depression    HSV-2 infection    Hyperlipidemia    no meds - diet controlled   Hyperplastic colon polyp    Mitral valve prolapse    mild to moderate MR by echo 10/2020   PVC's (premature ventricular contractions)    intol of BB, sxc palpitations r/t stress   RVOT ventricular tachycardia/PVCs    EP eval 01/2013 for freq PVCs    Past Surgical History:  Procedure Laterality Date   CESAREAN SECTION     x 3   COLONOSCOPY     KNEE SURGERY Right    MANDIBLE SURGERY     right side in front of ear   MOHS SURGERY  2021   POLYPECTOMY     WISDOM TOOTH EXTRACTION      There were no vitals filed for this visit.   Subjective Assessment - 11/28/20 1107     Subjective Last time was "hard" but in a good way.  Took some medicine and it's made my stomach upset today.    Pertinent History arthritis, history of cancer, depression    Currently in  Pain? No/denies    Pain Score 0-No pain   just my knee when I tax it                              [facility] PT Treatment/Exercise - 11/28/20 0001       High Level Balance   High Level Balance Comments high step, kicks,  side stepping, backward walk; back scratchers, field goal sign 2-4 laps each by the counter      Lumbar Exercises: Standing   Functional Squats 15 reps    Functional Squats Limitations hand hold x2; partial ROM    Other Standing Lumbar Exercises 2 5# dumbells ; partial dead lifts; x15      Knee/Hip Exercises: Aerobic   Recumbent Bike L2 x 6 minutes; PT present to discuss progress and assess response to activity      Knee/Hip Exercises: Standing   SLS with Vectors with slider into abduction and extension; x7 reps Lt/Rt    Other  Standing Knee Exercises resisted walking at power tower; 20lbs; all directions x4 trips    Other Standing Knee Exercises cable pulley 105#  UE support for 4 inch lateral step ups 5x right/left; assisted hurdle step overs 5x right/left      Knee/Hip Exercises: Seated   Sit to Sand 5 reps   mat table less pain with wide stance                     PT Short Term Goals - 10/30/20 1516       PT SHORT TERM GOAL #1   Title Patient will be independent with HEP for continued progression at home.    Time 6    Period Weeks    Status Achieved    Target Date 10/22/20      PT SHORT TERM GOAL #2   Title Patient will improve shoulder flexion to 120 degrees bilaterally for improved overhead reaching.    Baseline Lt 102; Rt 95    Time 6    Period Weeks    Status Achieved   Lt 127/Rt 159   Target Date 10/22/20      PT SHORT TERM GOAL #3   Title Patient will report no more than 4/10 pain for two consecutive sessions to indicate improved UE activity tolerance to more readily complete ADLs    Time 6    Period Weeks    Status Achieved   previously achieved   Target Date 10/22/20               PT Long Term  Goals - 11/26/20 1529       PT LONG TERM GOAL #1   Title Patient will be independent with advanced HEP for long term management of symptoms post D/C.    Time 12    Period Weeks    Status On-going      PT LONG TERM GOAL #2   Title Patient will improved FOTO score to 64 or higher to indicate improved overall function.    Time 12    Period Weeks    Status On-going      PT LONG TERM GOAL #3   Title Patient will demonstrate 140 degrees flexion bilaterally for improved overhead reaching    Time 12    Period Weeks    Status On-going      PT LONG TERM GOAL #4   Title Patient will raise/lower 4# from waist to overhead height without increased pain for improved ability to complete ADLs.    Time 12    Period Weeks    Status On-going      PT LONG TERM GOAL #5   Title Patient will complete five times sit to stand in <13s to indicate decreased fall risk    Baseline 22s    Time 9    Period Weeks    Status On-going                   Plan - 11/28/20 1150     Clinical Impression Statement The patient is able to perform more challenging strengthening ex's with modifications including wider stance and UE support to alleviate knee pain.  Difficulty maintaining balance with single limb standing activities.  She is concerned about her readiness for discharge from PT.  We discussed assessing progress toward goals next visit and possibly continuing for exercise progression at a reduced frequency.  We discussed setting new goals and she will continue to think about  more goals over the weekend.    Personal Factors and Comorbidities Comorbidity 3+    Comorbidities arthritis, cancer, depression    Examination-Activity Limitations Carry;Caring for Others;Reach Overhead;Sleep    Rehab Potential Good    PT Frequency 2x / week    PT Duration 12 weeks    PT Treatment/Interventions ADLs/Self Care Home Management;Cryotherapy;Electrical Stimulation;Iontophoresis 4mg /ml Dexamethasone;Moist  Heat;Traction;Therapeutic activities;Therapeutic exercise;Neuromuscular re-education;Patient/family education;Manual techniques;Passive range of motion;Dry needling;Taping;Spinal Manipulations;Joint Manipulations;Functional mobility training;Gait training;Balance training    PT Next Visit Plan ERO;  continue knee stabilizer strengthening; continue global strengthening    PT Home Exercise Plan Access Code: [email or web address]             Patient will benefit from skilled therapeutic intervention in order to improve the following deficits and impairments:  Decreased activity tolerance, Decreased range of motion, Decreased strength, Hypomobility, Increased muscle spasms, Impaired UE functional use, Postural dysfunction, Improper body mechanics, Pain  Visit Diagnosis: Cramp and spasm  Chronic left shoulder pain  Chronic right shoulder pain  Pain in left hip  Pain in right hip     Problem List Patient Active Problem List   Diagnosis Date Noted   Mitral valve prolapse    Aortic regurgitation    NSVT (nonsustained ventricular tachycardia) ([place]) 09/25/2020   Pre-diabetes 08/27/2020   Chronic pain of both shoulders 05/05/2018   Vitamin D deficiency 05/05/2018   Other fatigue 01/02/2018   Allergic rhinitis 01/02/2018   Weight gain 01/02/2018   PVC (premature ventricular contraction) 03/09/2017   Routine general medical examination at a health care facility 09/01/2015   Hyperlipidemia 09/01/2015   Varicose vein 08/27/2014   Primary localized osteoarthrosis, lower leg 10/30/2013   Anxiety state 03/23/2013   RVOT ventricular tachycardia/PVCs 01/31/2013   Depression 01/01/2013   Abnormal stress echo 12/11/2012   Palpitations 09/14/2010   Ruben Im, PT 11/28/20 11:59 AM Phone: [phone] Fax: [phone]  Alvera Singh 11/28/2020, 11:59 AM  [facility] [address] [place], [place], [address] Phone: [phone]    Fax:  [phone]  Name: [place] MRN: [id number] Date of Birth: 26-Sep-1950

## 2020-12-02 ENCOUNTER — Ambulatory Visit: Payer: PPO | Admitting: Physical Therapy

## 2020-12-02 ENCOUNTER — Other Ambulatory Visit: Payer: Self-pay

## 2020-12-02 DIAGNOSIS — M25551 Pain in right hip: Secondary | ICD-10-CM

## 2020-12-02 DIAGNOSIS — M25512 Pain in left shoulder: Secondary | ICD-10-CM

## 2020-12-02 DIAGNOSIS — M25552 Pain in left hip: Secondary | ICD-10-CM

## 2020-12-02 DIAGNOSIS — R252 Cramp and spasm: Secondary | ICD-10-CM

## 2020-12-02 DIAGNOSIS — M25561 Pain in right knee: Secondary | ICD-10-CM

## 2020-12-02 DIAGNOSIS — G8929 Other chronic pain: Secondary | ICD-10-CM

## 2020-12-02 DIAGNOSIS — M25562 Pain in left knee: Secondary | ICD-10-CM

## 2020-12-02 DIAGNOSIS — M6281 Muscle weakness (generalized): Secondary | ICD-10-CM

## 2020-12-02 NOTE — Patient Instructions (Signed)
Standing Lat Pull Down with Resistance - Elbows Bent - 1 x daily - 7 x weekly - 3 sets - 10 reps

## 2020-12-02 NOTE — Therapy (Signed)
[facility] [address] [place], [place], [address] Phone: [phone]   Fax:  [phone]  Physical Therapy Treatment  Patient Details  Name: [place] MRN: [id number] Date of Birth: 24-Jun-1950 Referring Provider (PT): Pricilla Holm, MD   Encounter Date: 12/02/2020  Progress Note Reporting Period 09/10/2020 to 12/02/2020  See note below for Objective Data and Assessment of Progress/Goals.       PT End of Session - 12/02/20 1703     Visit Number 18    Date for PT Re-Evaluation 01/28/21    Authorization Type [employer]    Progress Note Due on Visit 18    PT Start Time 0936    PT Stop Time 1015    PT Time Calculation (min) 39 min    Activity Tolerance Patient tolerated treatment well;No increased pain    Behavior During Therapy [facility] for tasks assessed/performed             Past Medical History:  Diagnosis Date   Allergy    Anxiety    Aortic regurgitation    moderate by echo 10/2020   Arthritis    Cancer ([place])    skin   Cataract    bilateral - MD is just watching    Diverticulosis    Fuchs' endothelial dystrophy    follows with optho regularly    Gestational diabetes    Heart murmur    MVP    History of depression    HSV-2 infection    Hyperlipidemia    no meds - diet controlled   Hyperplastic colon polyp    Mitral valve prolapse    mild to moderate MR by echo 10/2020   PVC's (premature ventricular contractions)    intol of BB, sxc palpitations r/t stress   RVOT ventricular tachycardia/PVCs    EP eval 01/2013 for freq PVCs    Past Surgical History:  Procedure Laterality Date   CESAREAN SECTION     x 3   COLONOSCOPY     KNEE SURGERY Right    MANDIBLE SURGERY     right side in front of ear   MOHS SURGERY  2021   POLYPECTOMY     WISDOM TOOTH EXTRACTION      There were no vitals filed for this visit.   Subjective Assessment - 12/02/20 1651     Subjective Feels  that she should continue therapy. Notes that she has less general pain but continues to be fatigued.    Pertinent History arthritis, history of cancer, depression    Limitations House hold activities    How long can you sit comfortably? unlimited    How long can you stand comfortably? unlimited    How long can you walk comfortably? unlimited    Diagnostic tests None    Currently in Pain? No/denies                [facility] PT Assessment - 12/02/20 0001       Assessment   Medical Diagnosis M25.511,G89.29,M25.512 (ICD-10-CM) - Chronic pain of both shoulders, M25.551,M25.552,G89.29 (ICD-10-CM) - Chronic pain of both hips  M25.561,M25.562,G89.29 (ICD-10-CM) - Chronic pain of both knees    Referring Provider (PT) Pricilla Holm, MD    Hand Dominance Right    Next MD Visit 6 weeks from today    Prior Therapy Yes- Rt shoulder 2020      Precautions   Precautions None      Restrictions   Weight Bearing  Restrictions No      Balance Screen   Has the patient fallen in the past 6 months No    Has the patient had a decrease in activity level because of a fear of falling?  No    Is the patient reluctant to leave their home because of a fear of falling?  No      Prior Function   Level of Independence Independent      Cognition   Overall Cognitive Status Within Functional Limits for tasks assessed      Observation/Other Assessments   Focus on Therapeutic Outcomes (FOTO)  59 (goal 64)      Posture/Postural Control   Posture/Postural Control No significant limitations      AROM   Right Shoulder ABduction 145 Degrees    Left Shoulder Flexion 156 Degrees      Transfers   Five time sit to stand comments  15.2      High Level Balance   High Level Balance Comments high step, kicks, side stepping, side stepping blue loop at calves                           [facility] Adult PT Treatment/Exercise - 12/02/20 0001       Knee/Hip Exercises: Aerobic   Recumbent Bike L2 x 7  minutes; PT present to discuss progress and assess response to activity      Knee/Hip Exercises: Standing   Other Standing Knee Exercises resisted walking at power tower; 20lbs; all directions x4 trips      Shoulder Exercises: Standing   Other Standing Exercises waist to overhead shelf reach; Lt UE x10 no weight and x10 with 1#; Rt UE with 2# x10      Shoulder Exercises: ROM/Strengthening   Lat Pull 15 reps    Lat Pull Limitations green tband over door                    PT Education - 12/02/20 1653     Education Details standing lat pull down green tband    Person(s) Educated Patient    Methods Explanation;Demonstration;Tactile cues;Verbal cues;Handout    Comprehension Verbalized understanding;Returned demonstration;Verbal cues required;Tactile cues required              PT Short Term Goals - 10/30/20 1516       PT SHORT TERM GOAL #1   Title Patient will be independent with HEP for continued progression at home.    Time 6    Period Weeks    Status Achieved    Target Date 10/22/20      PT SHORT TERM GOAL #2   Title Patient will improve shoulder flexion to 120 degrees bilaterally for improved overhead reaching.    Baseline Lt 102; Rt 95    Time 6    Period Weeks    Status Achieved   Lt 127/Rt 159   Target Date 10/22/20      PT SHORT TERM GOAL #3   Title Patient will report no more than 4/10 pain for two consecutive sessions to indicate improved UE activity tolerance to more readily complete ADLs    Time 6    Period Weeks    Status Achieved   previously achieved   Target Date 10/22/20               PT Long Term Goals - 12/02/20 0954       PT LONG TERM  GOAL #1   Title Patient will be independent with advanced HEP for long term management of symptoms post D/C.    Time 12    Period Weeks    Status On-going      PT LONG TERM GOAL #2   Title Patient will improved FOTO score to 64 or higher to indicate improved overall function.    Baseline 50     Time 12    Period Weeks    Status On-going   59     PT LONG TERM GOAL #3   Title Patient will demonstrate 140 degrees flexion bilaterally for improved overhead reaching    Time 12    Period Weeks    Status Achieved   Lt 155 (pain); Rt 146     PT LONG TERM GOAL #4   Title Patient will raise/lower 4# from waist to overhead height without increased pain for improved ability to complete ADLs.    Time 12    Period Weeks    Status On-going      PT LONG TERM GOAL #5   Title Patient will complete five times sit to stand in <13s to indicate decreased fall risk    Baseline 22s    Time 9    Period Weeks    Status On-going   15.2 sec     Additional Long Term Goals   Additional Long Term Goals Yes      PT LONG TERM GOAL #6   Title Patient will ascend/descend 3 steps carrying at least 10# bilaterally to more readily perform ADLs.    Time 8    Period Weeks    Status New    Target Date 01/27/21      PT LONG TERM GOAL #7   Title Patient will report 80% greater ease transitioning out of car for improved community accessibility.    Time 8    Period Weeks    Status New    Target Date 01/27/21      PT LONG TERM GOAL #8   Title Patient will ambulate at least 1.5 miles with RPE </= 5 to indicate improved community activity tolerance.    Time 8    Period Weeks    Status New    Target Date 01/27/21                   Plan - 12/02/20 1654     Clinical Impression Statement Patient is a 70 y/o female referred due to bilateral shoulder, hip, and knee pain. PMH includes history of cancer, arthritis, and depression. Patient demonstrates improved functional mobility as five time sit to stand time decreased from 22 seconds to 15 seconds. Shoulder AROM improved as patient able to flex bilateral shoulders >140 degrees. FOTO score increased indicating improved overall function. However deficits continue to persist as patient unable to raise functional weight overhead without increased pain.  Additionally, five time sit to stand time continues to be >13.5 seconds indicating increased fall risk. Patient would benefit from continued skilled intervention for improved functional mobility and functional use of bilateral UE for ADLs.    Personal Factors and Comorbidities Comorbidity 3+    Comorbidities arthritis, cancer, depression    Examination-Activity Limitations Carry;Caring for Others;Reach Overhead;Sleep    Examination-Participation Restrictions Meal Prep;Cleaning;Laundry    Stability/Clinical Decision Making Stable/Uncomplicated    Clinical Decision Making Low    Rehab Potential Good    PT Frequency 1x / week    PT Duration 8 weeks  PT Treatment/Interventions ADLs/Self Care Home Management;Cryotherapy;Electrical Stimulation;Iontophoresis 4mg /ml Dexamethasone;Moist Heat;Traction;Therapeutic activities;Therapeutic exercise;Neuromuscular re-education;Patient/family education;Manual techniques;Passive range of motion;Dry needling;Taping;Spinal Manipulations;Joint Manipulations;Functional mobility training;Gait training;Balance training    PT Next Visit Plan continue global strengthening, scapular stabilization, knee stabilizers    PT Home Exercise Plan Access Code: [email or web address]    Consulted and Agree with Plan of Care Patient             Patient will benefit from skilled therapeutic intervention in order to improve the following deficits and impairments:  Decreased activity tolerance, Decreased range of motion, Decreased strength, Hypomobility, Increased muscle spasms, Impaired UE functional use, Postural dysfunction, Improper body mechanics, Pain  Visit Diagnosis: Cramp and spasm - Plan: PT plan of care cert/re-cert  Chronic left shoulder pain - Plan: PT plan of care cert/re-cert  Chronic right shoulder pain - Plan: PT plan of care cert/re-cert  Pain in left hip - Plan: PT plan of care cert/re-cert  Pain in right hip - Plan: PT plan of care cert/re-cert  Muscle weakness  (generalized) - Plan: PT plan of care cert/re-cert  Chronic pain of left knee - Plan: PT plan of care cert/re-cert  Right knee pain, unspecified chronicity - Plan: PT plan of care cert/re-cert     Problem List Patient Active Problem List   Diagnosis Date Noted   Mitral valve prolapse    Aortic regurgitation    NSVT (nonsustained ventricular tachycardia) ([place]) 09/25/2020   Pre-diabetes 08/27/2020   Chronic pain of both shoulders 05/05/2018   Vitamin D deficiency 05/05/2018   Other fatigue 01/02/2018   Allergic rhinitis 01/02/2018   Weight gain 01/02/2018   PVC (premature ventricular contraction) 03/09/2017   Routine general medical examination at a health care facility 09/01/2015   Hyperlipidemia 09/01/2015   Varicose vein 08/27/2014   Primary localized osteoarthrosis, lower leg 10/30/2013   Anxiety state 03/23/2013   RVOT ventricular tachycardia/PVCs 01/31/2013   Depression 01/01/2013   Abnormal stress echo 12/11/2012   Palpitations 09/14/2010   Everardo All PT, DPT  12/02/20 5:06 PM   [facility] [address], [place], [place], [address] Phone: [phone]   Fax:  [phone]  Name: [place] MRN: [id number] Date of Birth: 13-Sep-1950

## 2020-12-09 ENCOUNTER — Other Ambulatory Visit: Payer: Self-pay

## 2020-12-09 ENCOUNTER — Ambulatory Visit: Payer: PPO | Admitting: Physical Therapy

## 2020-12-09 DIAGNOSIS — M25561 Pain in right knee: Secondary | ICD-10-CM

## 2020-12-09 DIAGNOSIS — R252 Cramp and spasm: Secondary | ICD-10-CM | POA: Diagnosis not present

## 2020-12-09 DIAGNOSIS — M25551 Pain in right hip: Secondary | ICD-10-CM

## 2020-12-09 DIAGNOSIS — M6281 Muscle weakness (generalized): Secondary | ICD-10-CM

## 2020-12-09 DIAGNOSIS — M25552 Pain in left hip: Secondary | ICD-10-CM

## 2020-12-09 DIAGNOSIS — G8929 Other chronic pain: Secondary | ICD-10-CM

## 2020-12-09 NOTE — Therapy (Signed)
[facility] [address] [place], [place], [address] Phone: [phone]   Fax:  [phone]  Physical Therapy Treatment  Patient Details  Name: Jean Smith MRN: [id number] Date of Birth: February 19, 1951 Referring Provider (PT): Pricilla Holm, MD   Encounter Date: 12/09/2020   PT End of Session - 12/09/20 1017     Visit Number 19    Date for PT Re-Evaluation 01/28/21    Authorization Type [employer]    Progress Note Due on Visit 28    PT Start Time 7510    PT Stop Time 1012    PT Time Calculation (min) 38 min    Activity Tolerance Patient tolerated treatment well;No increased pain    Behavior During Therapy [facility] for tasks assessed/performed             Past Medical History:  Diagnosis Date   Allergy    Anxiety    Aortic regurgitation    moderate by echo 10/2020   Arthritis    Cancer ([place])    skin   Cataract    bilateral - MD is just watching    Diverticulosis    Fuchs' endothelial dystrophy    follows with optho regularly    Gestational diabetes    Heart murmur    MVP    History of depression    HSV-2 infection    Hyperlipidemia    no meds - diet controlled   Hyperplastic colon polyp    Mitral valve prolapse    mild to moderate MR by echo 10/2020   PVC's (premature ventricular contractions)    intol of BB, sxc palpitations r/t stress   RVOT ventricular tachycardia/PVCs    EP eval 01/2013 for freq PVCs    Past Surgical History:  Procedure Laterality Date   CESAREAN SECTION     x 3   COLONOSCOPY     KNEE SURGERY Right    MANDIBLE SURGERY     right side in front of ear   MOHS SURGERY  2021   POLYPECTOMY     WISDOM TOOTH EXTRACTION      There were no vitals filed for this visit.   Subjective Assessment - 12/09/20 1016     Subjective Has the same issues as last week.    Pertinent History arthritis, history of cancer, depression    Limitations House hold activities     How long can you sit comfortably? unlimited    How long can you stand comfortably? unlimited    How long can you walk comfortably? unlimited    Diagnostic tests None    Currently in Pain? No/denies                               [facility] Adult PT Treatment/Exercise - 12/09/20 0001       Neuro Re-ed    Neuro Re-ed Details  SLS with vectors: fwd 45, lateral, backward 45 degrees; tandem stance firm 2x30s; feet together black foam x30s; hurdle step over: step over x4, reciprocal step over x4, lateral step over x8      Lumbar Exercises: Standing   Row Power tower;Both;15 reps    Row Limitations 30#, wide staggered stance      Knee/Hip Exercises: Stretches   Press photographer Right;Left;1 rep;20 seconds    Gastroc Stretch Limitations using rockerboard      Knee/Hip Exercises: Aerobic   Recumbent Bike L2 x 6  minutes; PT present to discuss progress and assess response to activity                      PT Short Term Goals - 10/30/20 1516       PT SHORT TERM GOAL #1   Title Patient will be independent with HEP for continued progression at home.    Time 6    Period Weeks    Status Achieved    Target Date 10/22/20      PT SHORT TERM GOAL #2   Title Patient will improve shoulder flexion to 120 degrees bilaterally for improved overhead reaching.    Baseline Lt 102; Rt 95    Time 6    Period Weeks    Status Achieved   Lt 127/Rt 159   Target Date 10/22/20      PT SHORT TERM GOAL #3   Title Patient will report no more than 4/10 pain for two consecutive sessions to indicate improved UE activity tolerance to more readily complete ADLs    Time 6    Period Weeks    Status Achieved   previously achieved   Target Date 10/22/20               PT Long Term Goals - 12/09/20 1008       PT LONG TERM GOAL #5   Title Patient will complete five times sit to stand in <13s to indicate decreased fall risk    Baseline 22s    Period Weeks    Status Achieved    12.92s     PT LONG TERM GOAL #7   Title Patient will report 80% greater ease transitioning out of car for improved community accessibility.    Time 8    Period Weeks    Status Partially Met   50% improvement                  Plan - 12/09/20 1014     Clinical Impression Statement Patient demonstrates increased instability in tandem stance with bilateral knee and ankle buckling indicating need for knee and ankle strengthening. Requiring min assist to maintain standing during hurdle step over activity on one occasion indicating continued dynamic balance impairments. Functional mobility improved as patient meeting five times sit to stand goal and completing test in 12.92 seconds. Patient would benefit from continued skilled intervention for improved functional mobility and functional use of bilateral UE for ADLs.    Personal Factors and Comorbidities Comorbidity 3+    Comorbidities arthritis, cancer, depression    Examination-Activity Limitations Carry;Caring for Others;Reach Overhead;Sleep    Examination-Participation Restrictions Meal Prep;Cleaning;Laundry    Rehab Potential Good    PT Frequency 1x / week    PT Duration 8 weeks    PT Treatment/Interventions ADLs/Self Care Home Management;Cryotherapy;Electrical Stimulation;Iontophoresis 33m/ml Dexamethasone;Moist Heat;Traction;Therapeutic activities;Therapeutic exercise;Neuromuscular re-education;Patient/family education;Manual techniques;Passive range of motion;Dry needling;Taping;Spinal Manipulations;Joint Manipulations;Functional mobility training;Gait training;Balance training    PT Next Visit Plan continue global strengthening, scapular stabilization, knee stabilizers, ankle strengthening    PT Home Exercise Plan Access Code: [email or web address]   Consulted and Agree with Plan of Care Patient             Patient will benefit from skilled therapeutic intervention in order to improve the following deficits and impairments:  Decreased  activity tolerance, Decreased range of motion, Decreased strength, Hypomobility, Increased muscle spasms, Impaired UE functional use, Postural dysfunction, Improper body mechanics, Pain  Visit Diagnosis: Cramp and spasm  Chronic left shoulder pain  Chronic right shoulder pain  Pain in left hip  Pain in right hip  Muscle weakness (generalized)  Chronic pain of left knee  Right knee pain, unspecified chronicity     Problem List Patient Active Problem List   Diagnosis Date Noted   Mitral valve prolapse    Aortic regurgitation    NSVT (nonsustained ventricular tachycardia) ([place]) 09/25/2020   Pre-diabetes 08/27/2020   Chronic pain of both shoulders 05/05/2018   Vitamin D deficiency 05/05/2018   Other fatigue 01/02/2018   Allergic rhinitis 01/02/2018   Weight gain 01/02/2018   PVC (premature ventricular contraction) 03/09/2017   Routine general medical examination at a health care facility 09/01/2015   Hyperlipidemia 09/01/2015   Varicose vein 08/27/2014   Primary localized osteoarthrosis, lower leg 10/30/2013   Anxiety state 03/23/2013   RVOT ventricular tachycardia/PVCs 01/31/2013   Depression 01/01/2013   Abnormal stress echo 12/11/2012   Palpitations 09/14/2010    Everardo All PT, DPT  12/09/20 10:18 AM   [facility] [address], [place], [place], [address] Phone: [phone]   Fax:  [phone]  Name: Jean Smith MRN: [id number] Date of Birth: 23-Jul-1950

## 2020-12-17 ENCOUNTER — Ambulatory Visit: Payer: PPO | Attending: Internal Medicine | Admitting: Physical Therapy

## 2020-12-17 ENCOUNTER — Other Ambulatory Visit: Payer: Self-pay

## 2020-12-17 DIAGNOSIS — M25562 Pain in left knee: Secondary | ICD-10-CM | POA: Diagnosis not present

## 2020-12-17 DIAGNOSIS — G8929 Other chronic pain: Secondary | ICD-10-CM

## 2020-12-17 DIAGNOSIS — R252 Cramp and spasm: Secondary | ICD-10-CM | POA: Diagnosis not present

## 2020-12-17 DIAGNOSIS — M25511 Pain in right shoulder: Secondary | ICD-10-CM | POA: Insufficient documentation

## 2020-12-17 DIAGNOSIS — M25551 Pain in right hip: Secondary | ICD-10-CM | POA: Diagnosis not present

## 2020-12-17 DIAGNOSIS — M25561 Pain in right knee: Secondary | ICD-10-CM

## 2020-12-17 DIAGNOSIS — M25552 Pain in left hip: Secondary | ICD-10-CM | POA: Insufficient documentation

## 2020-12-17 DIAGNOSIS — M25512 Pain in left shoulder: Secondary | ICD-10-CM | POA: Diagnosis not present

## 2020-12-17 DIAGNOSIS — M6281 Muscle weakness (generalized): Secondary | ICD-10-CM | POA: Diagnosis not present

## 2020-12-17 NOTE — Therapy (Signed)
[facility] [address], [place], [place], [address] Phone: [phone]   Fax:  [phone]  Physical Therapy Treatment  Patient Details  Name: Jean Smith MRN: [id number] Date of Birth: Jun 04, 1951 Referring Provider (PT): Pricilla Holm, MD   Encounter Date: 12/17/2020   PT End of Session - 12/17/20 1733     Visit Number 20    Date for PT Re-Evaluation 01/28/21    Authorization Type [employer]    Progress Note Due on Visit 28    PT Start Time 1152   Patient arriving late   PT Stop Time 1230    PT Time Calculation (min) 38 min    Activity Tolerance Patient tolerated treatment well;No increased pain    Behavior During Therapy [facility] for tasks assessed/performed             Past Medical History:  Diagnosis Date   Allergy    Anxiety    Aortic regurgitation    moderate by echo 10/2020   Arthritis    Cancer ([place])    skin   Cataract    bilateral - MD is just watching    Diverticulosis    Fuchs' endothelial dystrophy    follows with optho regularly    Gestational diabetes    Heart murmur    MVP    History of depression    HSV-2 infection    Hyperlipidemia    no meds - diet controlled   Hyperplastic colon polyp    Mitral valve prolapse    mild to moderate MR by echo 10/2020   PVC's (premature ventricular contractions)    intol of BB, sxc palpitations r/t stress   RVOT ventricular tachycardia/PVCs    EP eval 01/2013 for freq PVCs    Past Surgical History:  Procedure Laterality Date   CESAREAN SECTION     x 3   COLONOSCOPY     KNEE SURGERY Right    MANDIBLE SURGERY     right side in front of ear   MOHS SURGERY  2021   POLYPECTOMY     WISDOM TOOTH EXTRACTION      There were no vitals filed for this visit.   Subjective Assessment - 12/17/20 1730     Subjective Just feels tired. Shoulders feel great.    Pertinent History arthritis, history of cancer, depression    Limitations  House hold activities    How long can you sit comfortably? unlimited    How long can you stand comfortably? unlimited    How long can you walk comfortably? unlimited    Diagnostic tests None    Currently in Pain? No/denies                               [facility] Adult PT Treatment/Exercise - 12/17/20 0001       High Level Balance   High Level Balance Comments hurdle step over: single step x 6, reciprocal step over x6, lateral step over x 6      Neuro Re-ed    Neuro Re-ed Details  stagger stance black foam 2 x 30s; close staggered stance 2 x 30s      Knee/Hip Exercises: Aerobic   Recumbent Bike L2 x 5 minutes; PT present to discuss progress and assess response to activity      Knee/Hip Exercises: Standing   Forward Step Up Right;Left;1 set;10 reps;Hand Hold: 2  Forward Step Up Limitations step up to BOSU    Other Standing Knee Exercises resisted walking at power tower; 20lbs; all directions x4 trips    Other Standing Knee Exercises 3-way hip, blue loop, x10 Rt/Lt      Knee/Hip Exercises: Seated   Sit to Sand without UE support   x5 reps holding green tband for resistance; x10 with red band at knees                     PT Short Term Goals - 10/30/20 1516       PT SHORT TERM GOAL #1   Title Patient will be independent with HEP for continued progression at home.    Time 6    Period Weeks    Status Achieved    Target Date 10/22/20      PT SHORT TERM GOAL #2   Title Patient will improve shoulder flexion to 120 degrees bilaterally for improved overhead reaching.    Baseline Lt 102; Rt 95    Time 6    Period Weeks    Status Achieved   Lt 127/Rt 159   Target Date 10/22/20      PT SHORT TERM GOAL #3   Title Patient will report no more than 4/10 pain for two consecutive sessions to indicate improved UE activity tolerance to more readily complete ADLs    Time 6    Period Weeks    Status Achieved   previously achieved   Target Date 10/22/20                PT Long Term Goals - 12/09/20 1008       PT LONG TERM GOAL #5   Title Patient will complete five times sit to stand in <13s to indicate decreased fall risk    Baseline 22s    Period Weeks    Status Achieved   12.92s     PT LONG TERM GOAL #7   Title Patient will report 80% greater ease transitioning out of car for improved community accessibility.    Time 8    Period Weeks    Status Partially Met   50% improvement                  Plan - 12/17/20 1730     Clinical Impression Statement Patient reporting Lt knee pain when attempting lateral step up to BOSU. Patient completing resisted walking activity and hurdle activities without external support indicating improved dynamic balance. She reported increased fatigue during hip abduction portion of 3-way hip exercise indicating continued glute strength impairments. Patient would benefit from continued skilled intervention for improved functional mobility and functional use of bilateral UE for ADLs.    Personal Factors and Comorbidities Comorbidity 3+    Comorbidities arthritis, cancer, depression    Examination-Activity Limitations Carry;Caring for Others;Reach Overhead;Sleep    Examination-Participation Restrictions Meal Prep;Cleaning;Laundry    Rehab Potential Good    PT Frequency 1x / week    PT Duration 8 weeks    PT Treatment/Interventions ADLs/Self Care Home Management;Cryotherapy;Electrical Stimulation;Iontophoresis 13m/ml Dexamethasone;Moist Heat;Traction;Therapeutic activities;Therapeutic exercise;Neuromuscular re-education;Patient/family education;Manual techniques;Passive range of motion;Dry needling;Taping;Spinal Manipulations;Joint Manipulations;Functional mobility training;Gait training;Balance training    PT Next Visit Plan continue global strengthening, scapular stabilization, knee stabilizers, ankle strengthening    PT Home Exercise Plan Access Code: [email or web address]   Consulted and Agree with Plan of  Care Patient             Patient will  benefit from skilled therapeutic intervention in order to improve the following deficits and impairments:  Decreased activity tolerance, Decreased range of motion, Decreased strength, Hypomobility, Increased muscle spasms, Impaired UE functional use, Postural dysfunction, Improper body mechanics, Pain  Visit Diagnosis: Cramp and spasm  Chronic left shoulder pain  Chronic right shoulder pain  Pain in left hip  Pain in right hip  Muscle weakness (generalized)  Chronic pain of left knee  Right knee pain, unspecified chronicity     Problem List Patient Active Problem List   Diagnosis Date Noted   Mitral valve prolapse    Aortic regurgitation    NSVT (nonsustained ventricular tachycardia) ([place]) 09/25/2020   Pre-diabetes 08/27/2020   Chronic pain of both shoulders 05/05/2018   Vitamin D deficiency 05/05/2018   Other fatigue 01/02/2018   Allergic rhinitis 01/02/2018   Weight gain 01/02/2018   PVC (premature ventricular contraction) 03/09/2017   Routine general medical examination at a health care facility 09/01/2015   Hyperlipidemia 09/01/2015   Varicose vein 08/27/2014   Primary localized osteoarthrosis, lower leg 10/30/2013   Anxiety state 03/23/2013   RVOT ventricular tachycardia/PVCs 01/31/2013   Depression 01/01/2013   Abnormal stress echo 12/11/2012   Palpitations 09/14/2010   Everardo All PT, DPT  12/17/20 5:35 PM    [facility] [address], [place], [place], [address] Phone: [phone]   Fax:  [phone]  Name: Jean Smith MRN: [id number] Date of Birth: [birth year]

## 2020-12-24 ENCOUNTER — Ambulatory Visit: Payer: PPO | Admitting: Physical Therapy

## 2020-12-24 ENCOUNTER — Other Ambulatory Visit: Payer: Self-pay

## 2020-12-24 DIAGNOSIS — M6281 Muscle weakness (generalized): Secondary | ICD-10-CM

## 2020-12-24 DIAGNOSIS — M25562 Pain in left knee: Secondary | ICD-10-CM

## 2020-12-24 DIAGNOSIS — M25561 Pain in right knee: Secondary | ICD-10-CM

## 2020-12-24 DIAGNOSIS — M25552 Pain in left hip: Secondary | ICD-10-CM

## 2020-12-24 DIAGNOSIS — G8929 Other chronic pain: Secondary | ICD-10-CM

## 2020-12-24 DIAGNOSIS — R252 Cramp and spasm: Secondary | ICD-10-CM

## 2020-12-24 DIAGNOSIS — M25551 Pain in right hip: Secondary | ICD-10-CM

## 2020-12-24 NOTE — Therapy (Signed)
[facility] [address] [place], [place], [address] Phone: [phone]   Fax:  [phone]  Physical Therapy Treatment  Patient Details  Name: Jean Smith MRN: [id number] Date of Birth: May 29, 1951 Referring Provider (PT): Pricilla Holm, MD   Encounter Date: 12/24/2020   PT End of Session - 12/24/20 1452     Visit Number 21    Date for PT Re-Evaluation 01/28/21    Authorization Type [employer]    Progress Note Due on Visit 28    PT Start Time 6226    PT Stop Time 3335    PT Time Calculation (min) 39 min    Activity Tolerance Patient tolerated treatment well;No increased pain    Behavior During Therapy [facility] for tasks assessed/performed             Past Medical History:  Diagnosis Date   Allergy    Anxiety    Aortic regurgitation    moderate by echo 10/2020   Arthritis    Cancer ([place])    skin   Cataract    bilateral - MD is just watching    Diverticulosis    Fuchs' endothelial dystrophy    follows with optho regularly    Gestational diabetes    Heart murmur    MVP    History of depression    HSV-2 infection    Hyperlipidemia    no meds - diet controlled   Hyperplastic colon polyp    Mitral valve prolapse    mild to moderate MR by echo 10/2020   PVC's (premature ventricular contractions)    intol of BB, sxc palpitations r/t stress   RVOT ventricular tachycardia/PVCs    EP eval 01/2013 for freq PVCs    Past Surgical History:  Procedure Laterality Date   CESAREAN SECTION     x 3   COLONOSCOPY     KNEE SURGERY Right    MANDIBLE SURGERY     right side in front of ear   MOHS SURGERY  2021   POLYPECTOMY     WISDOM TOOTH EXTRACTION      There were no vitals filed for this visit.   Subjective Assessment - 12/24/20 1449     Subjective Is stressed due to other things happening at home.    Pertinent History arthritis, history of cancer, depression    Limitations House hold  activities    How long can you sit comfortably? unlimited    How long can you stand comfortably? unlimited    How long can you walk comfortably? unlimited    Diagnostic tests None    Currently in Pain? No/denies                               [facility] Adult PT Treatment/Exercise - 12/24/20 0001       High Level Balance   High Level Balance Comments hurdle step over: single step x 6, reciprocal step over x6, lateral step over x 6      Knee/Hip Exercises: Aerobic   Recumbent Bike L2 x 8 minutes: PT present to discuss progress and assess response to activity      Knee/Hip Exercises: Standing   SLS with Vectors with slider into abduction; x10 Lt/Rt    Other Standing Knee Exercises resisted walking at power tower; 20lbs; all directions x5 trips      Knee/Hip Exercises: [place]  Right;Left;1 set;10 reps    Long Arc Quad Weight 2 lbs.    Long Arc Quad Limitations with ball squeeze    Ball Squeeze 15 x 5s hold                      PT Short Term Goals - 10/30/20 1516       PT SHORT TERM GOAL #1   Title Patient will be independent with HEP for continued progression at home.    Time 6    Period Weeks    Status Achieved    Target Date 10/22/20      PT SHORT TERM GOAL #2   Title Patient will improve shoulder flexion to 120 degrees bilaterally for improved overhead reaching.    Baseline Lt 102; Rt 95    Time 6    Period Weeks    Status Achieved   Lt 127/Rt 159   Target Date 10/22/20      PT SHORT TERM GOAL #3   Title Patient will report no more than 4/10 pain for two consecutive sessions to indicate improved UE activity tolerance to more readily complete ADLs    Time 6    Period Weeks    Status Achieved   previously achieved   Target Date 10/22/20               PT Long Term Goals - 12/24/20 1451       PT LONG TERM GOAL #1   Title Patient will be independent with advanced HEP for long term management of symptoms post D/C.     Time 12    Period Weeks    Status On-going      PT LONG TERM GOAL #2   Title Patient will improved FOTO score to 64 or higher to indicate improved overall function.    Baseline 50    Time 12    Period Weeks    Status On-going      PT LONG TERM GOAL #6   Title Patient will ascend/descend 3 steps carrying at least 10# bilaterally to more readily perform ADLs.    Time 8    Period Weeks    Status On-going      PT LONG TERM GOAL #7   Title Patient will report 80% greater ease transitioning out of car for improved community accessibility.    Time 8    Period Weeks    Status On-going      PT LONG TERM GOAL #8   Title Patient will ambulate at least 1.5 miles with RPE </= 5 to indicate improved community activity tolerance.    Time 8    Period Weeks    Status On-going                   Plan - 12/24/20 1449     Clinical Impression Statement Patient reporting Lt knee pain when performing single limb stance slider activity. She required CGA x2 occurences when performing hurdle step over activity due to loss of balance. She demonstrates improved LE strength as she tolerated increased repetitions of resisted walking activity. Patient would benefit from continued skilled intervention for improved functional activity tolerance.    Personal Factors and Comorbidities Comorbidity 3+    Comorbidities arthritis, cancer, depression    Examination-Activity Limitations Carry;Caring for Others;Reach Overhead;Sleep    Rehab Potential Good    PT Frequency 1x / week    PT Duration 8 weeks    PT  Treatment/Interventions ADLs/Self Care Home Management;Cryotherapy;Electrical Stimulation;Iontophoresis 4mg /ml Dexamethasone;Moist Heat;Traction;Therapeutic activities;Therapeutic exercise;Neuromuscular re-education;Patient/family education;Manual techniques;Passive range of motion;Dry needling;Taping;Spinal Manipulations;Joint Manipulations;Functional mobility training;Gait training;Balance training     PT Next Visit Plan continue global strengthening, scapular stabilization, knee stabilizers, ankle strengthening    PT Home Exercise Plan Access Code: [email or web address]    Consulted and Agree with Plan of Care Patient             Patient will benefit from skilled therapeutic intervention in order to improve the following deficits and impairments:  Decreased activity tolerance, Decreased range of motion, Decreased strength, Hypomobility, Increased muscle spasms, Impaired UE functional use, Postural dysfunction, Improper body mechanics, Pain  Visit Diagnosis: Cramp and spasm  Chronic left shoulder pain  Chronic right shoulder pain  Pain in left hip  Pain in right hip  Muscle weakness (generalized)  Chronic pain of left knee  Right knee pain, unspecified chronicity     Problem List Patient Active Problem List   Diagnosis Date Noted   Mitral valve prolapse    Aortic regurgitation    NSVT (nonsustained ventricular tachycardia) ([place]) 09/25/2020   Pre-diabetes 08/27/2020   Chronic pain of both shoulders 05/05/2018   Vitamin D deficiency 05/05/2018   Other fatigue 01/02/2018   Allergic rhinitis 01/02/2018   Weight gain 01/02/2018   PVC (premature ventricular contraction) 03/09/2017   Routine general medical examination at a health care facility 09/01/2015   Hyperlipidemia 09/01/2015   Varicose vein 08/27/2014   Primary localized osteoarthrosis, lower leg 10/30/2013   Anxiety state 03/23/2013   RVOT ventricular tachycardia/PVCs 01/31/2013   Depression 01/01/2013   Abnormal stress echo 12/11/2012   Palpitations 09/14/2010    Everardo All PT, DPT  12/24/20 2:53 PM   [facility] [address], [place], [place], [address] Phone: [phone]   Fax:  [phone]  Name: Jean Smith MRN: [id number] Date of Birth: November 16, 1950

## 2020-12-31 ENCOUNTER — Ambulatory Visit: Payer: PPO | Admitting: Physical Therapy

## 2020-12-31 ENCOUNTER — Other Ambulatory Visit: Payer: Self-pay

## 2020-12-31 DIAGNOSIS — R252 Cramp and spasm: Secondary | ICD-10-CM | POA: Diagnosis not present

## 2020-12-31 DIAGNOSIS — M6281 Muscle weakness (generalized): Secondary | ICD-10-CM

## 2020-12-31 DIAGNOSIS — M25552 Pain in left hip: Secondary | ICD-10-CM

## 2020-12-31 DIAGNOSIS — M25551 Pain in right hip: Secondary | ICD-10-CM

## 2020-12-31 DIAGNOSIS — M25512 Pain in left shoulder: Secondary | ICD-10-CM

## 2020-12-31 DIAGNOSIS — G8929 Other chronic pain: Secondary | ICD-10-CM

## 2020-12-31 DIAGNOSIS — M25561 Pain in right knee: Secondary | ICD-10-CM

## 2021-01-01 NOTE — Therapy (Signed)
[facility] [address] [place], [place], [address] Phone: [phone]   Fax:  [phone]  Physical Therapy Treatment  Patient Details  Name: Jean Smith MRN: [id number] Date of Birth: 1951/05/28 Referring Provider (PT): Pricilla Holm, MD   Encounter Date: 12/31/2020   PT End of Session - 01/01/21 1131     Visit Number 22    Date for PT Re-Evaluation 01/28/21    Authorization Type [employer]    Progress Note Due on Visit 28    PT Start Time 7782    PT Stop Time 1228    PT Time Calculation (min) 39 min    Activity Tolerance Patient tolerated treatment well;No increased pain    Behavior During Therapy [facility] for tasks assessed/performed             Past Medical History:  Diagnosis Date   Allergy    Anxiety    Aortic regurgitation    moderate by echo 10/2020   Arthritis    Cancer ([place])    skin   Cataract    bilateral - MD is just watching    Diverticulosis    Fuchs' endothelial dystrophy    follows with optho regularly    Gestational diabetes    Heart murmur    MVP    History of depression    HSV-2 infection    Hyperlipidemia    no meds - diet controlled   Hyperplastic colon polyp    Mitral valve prolapse    mild to moderate MR by echo 10/2020   PVC's (premature ventricular contractions)    intol of BB, sxc palpitations r/t stress   RVOT ventricular tachycardia/PVCs    EP eval 01/2013 for freq PVCs    Past Surgical History:  Procedure Laterality Date   CESAREAN SECTION     x 3   COLONOSCOPY     KNEE SURGERY Right    MANDIBLE SURGERY     right side in front of ear   MOHS SURGERY  2021   POLYPECTOMY     WISDOM TOOTH EXTRACTION      There were no vitals filed for this visit.   Subjective Assessment - 01/01/21 1127     Subjective Feels about the same. No change    Pertinent History arthritis, history of cancer, depression    Limitations House hold activities    How  long can you sit comfortably? unlimited    How long can you stand comfortably? unlimited    How long can you walk comfortably? unlimited    Diagnostic tests None    Currently in Pain? No/denies                               [facility] Adult PT Treatment/Exercise - 01/01/21 0001       Knee/Hip Exercises: Aerobic   Recumbent Bike L2 x 8 minutes: PT present to discuss progress and assess response to activity      Knee/Hip Exercises: Standing   Heel Raises Both;1 set;20 reps    SLS with Vectors with slider into abduction and extension; x10 Lt/Rt    Other Standing Knee Exercises resisted walking at power tower; 25lbs; all directions x4 trips    Other Standing Knee Exercises resisted side stepping; blue loop at feet, 4 x 10 feet Lt/Rt      Knee/Hip Exercises: Seated   Clamshell with TheraBand --   black  loop x15 repetitions                     PT Short Term Goals - 10/30/20 1516       PT SHORT TERM GOAL #1   Title Patient will be independent with HEP for continued progression at home.    Time 6    Period Weeks    Status Achieved    Target Date 10/22/20      PT SHORT TERM GOAL #2   Title Patient will improve shoulder flexion to 120 degrees bilaterally for improved overhead reaching.    Baseline Lt 102; Rt 95    Time 6    Period Weeks    Status Achieved   Lt 127/Rt 159   Target Date 10/22/20      PT SHORT TERM GOAL #3   Title Patient will report no more than 4/10 pain for two consecutive sessions to indicate improved UE activity tolerance to more readily complete ADLs    Time 6    Period Weeks    Status Achieved   previously achieved   Target Date 10/22/20               PT Long Term Goals - 01/01/21 1131       PT LONG TERM GOAL #1   Title Patient will be independent with advanced HEP for long term management of symptoms post D/C.    Time 12    Period Weeks    Status On-going                   Plan - 01/01/21 1127      Clinical Impression Statement Patient requesting decreased resistance when cycling on recumbent bike. Stating that her heart was fluttering. PT assessing HR which was 118 bpm which is 50% of HR max and WFL. However, resistance decreased per patient request. Therapist inquiring about cardiology appointment mentioned by patient. She states that she went but elected not to wear the monitor. Patient would benefit from continued skilled intervention for improved activity tolerance.    Personal Factors and Comorbidities Comorbidity 3+    Comorbidities arthritis, cancer, depression    Examination-Activity Limitations Carry;Caring for Others;Reach Overhead;Sleep    Examination-Participation Restrictions Meal Prep;Cleaning;Laundry    Rehab Potential Good    PT Frequency 1x / week    PT Duration 8 weeks    PT Treatment/Interventions ADLs/Self Care Home Management;Cryotherapy;Electrical Stimulation;Iontophoresis 4mg /ml Dexamethasone;Moist Heat;Traction;Therapeutic activities;Therapeutic exercise;Neuromuscular re-education;Patient/family education;Manual techniques;Passive range of motion;Dry needling;Taping;Spinal Manipulations;Joint Manipulations;Functional mobility training;Gait training;Balance training    PT Next Visit Plan continue global strengthening, scapular stabilization, knee stabilizers, ankle strengthening; begin discussing D/C    PT Home Exercise Plan Access Code: [email or web address]    Consulted and Agree with Plan of Care Patient             Patient will benefit from skilled therapeutic intervention in order to improve the following deficits and impairments:  Decreased activity tolerance, Decreased range of motion, Decreased strength, Hypomobility, Increased muscle spasms, Impaired UE functional use, Postural dysfunction, Improper body mechanics, Pain  Visit Diagnosis: Cramp and spasm  Chronic left shoulder pain  Chronic right shoulder pain  Pain in left hip  Pain in right hip  Muscle  weakness (generalized)  Chronic pain of left knee  Right knee pain, unspecified chronicity     Problem List Patient Active Problem List   Diagnosis Date Noted   Mitral valve prolapse    Aortic regurgitation  NSVT (nonsustained ventricular tachycardia) ([place]) 09/25/2020   Pre-diabetes 08/27/2020   Chronic pain of both shoulders 05/05/2018   Vitamin D deficiency 05/05/2018   Other fatigue 01/02/2018   Allergic rhinitis 01/02/2018   Weight gain 01/02/2018   PVC (premature ventricular contraction) 03/09/2017   Routine general medical examination at a health care facility 09/01/2015   Hyperlipidemia 09/01/2015   Varicose vein 08/27/2014   Primary localized osteoarthrosis, lower leg 10/30/2013   Anxiety state 03/23/2013   RVOT ventricular tachycardia/PVCs 01/31/2013   Depression 01/01/2013   Abnormal stress echo 12/11/2012   Palpitations 09/14/2010   Everardo All PT, DPT  01/01/21 11:32 AM   [facility] [address], [place], [place], [address] Phone: [phone]   Fax:  [phone]  Name: LAMARA BRECHT MRN: [id number] Date of Birth: 09-11-1950

## 2021-01-07 ENCOUNTER — Other Ambulatory Visit: Payer: Self-pay

## 2021-01-07 ENCOUNTER — Ambulatory Visit: Payer: PPO | Admitting: Physical Therapy

## 2021-01-07 DIAGNOSIS — M25551 Pain in right hip: Secondary | ICD-10-CM

## 2021-01-07 DIAGNOSIS — M6281 Muscle weakness (generalized): Secondary | ICD-10-CM

## 2021-01-07 DIAGNOSIS — G8929 Other chronic pain: Secondary | ICD-10-CM

## 2021-01-07 DIAGNOSIS — M25562 Pain in left knee: Secondary | ICD-10-CM

## 2021-01-07 DIAGNOSIS — R252 Cramp and spasm: Secondary | ICD-10-CM

## 2021-01-07 DIAGNOSIS — M25552 Pain in left hip: Secondary | ICD-10-CM

## 2021-01-07 DIAGNOSIS — M25561 Pain in right knee: Secondary | ICD-10-CM

## 2021-01-07 NOTE — Therapy (Signed)
[facility] [address] [place], [place], [address] Phone: [phone]   Fax:  [phone]  Physical Therapy Treatment  Patient Details  Name: Jean Smith MRN: [id number] Date of Birth: 1951/04/07 Referring Provider (PT): Pricilla Holm, MD   Encounter Date: 01/07/2021   PT End of Session - 01/07/21 1659     Visit Number 23    Date for PT Re-Evaluation 01/28/21    Authorization Type [employer]    Progress Note Due on Visit 28    PT Start Time [id number]    PT Stop Time 1311    PT Time Calculation (min) 38 min    Activity Tolerance Patient tolerated treatment well;No increased pain    Behavior During Therapy [facility] for tasks assessed/performed             Past Medical History:  Diagnosis Date   Allergy    Anxiety    Aortic regurgitation    moderate by echo 10/2020   Arthritis    Cancer ([place])    skin   Cataract    bilateral - MD is just watching    Diverticulosis    Fuchs' endothelial dystrophy    follows with optho regularly    Gestational diabetes    Heart murmur    MVP    History of depression    HSV-2 infection    Hyperlipidemia    no meds - diet controlled   Hyperplastic colon polyp    Mitral valve prolapse    mild to moderate MR by echo 10/2020   PVC's (premature ventricular contractions)    intol of BB, sxc palpitations r/t stress   RVOT ventricular tachycardia/PVCs    EP eval 01/2013 for freq PVCs    Past Surgical History:  Procedure Laterality Date   CESAREAN SECTION     x 3   COLONOSCOPY     KNEE SURGERY Right    MANDIBLE SURGERY     right side in front of ear   MOHS SURGERY  2021   POLYPECTOMY     WISDOM TOOTH EXTRACTION      There were no vitals filed for this visit.   Subjective Assessment - 01/07/21 1654     Subjective Is considering getting a gym membership. Continues to have ankle weakness.    Pertinent History arthritis, history of cancer, depression     Limitations House hold activities    How long can you sit comfortably? unlimited    How long can you stand comfortably? unlimited    How long can you walk comfortably? unlimited    Diagnostic tests None    Currently in Pain? No/denies                               [facility] Adult PT Treatment/Exercise - 01/07/21 0001       Knee/Hip Exercises: Aerobic   Recumbent Bike L2 x 8 minutes: PT present to discuss progress and assess response to activity      Knee/Hip Exercises: Standing   Hip Abduction Right;Left;2 sets;10 reps;Knee straight    Abduction Limitations blue loop    Hip Extension Right;Left;2 sets;10 reps;Knee straight    Extension Limitations blue loop    Forward Step Up Right;Left;10 reps;Hand Hold: 1;Step Height: 6"    Forward Step Up Limitations 5# dumbbell    Other Standing Knee Exercises resisted walking at power tower; 25lbs; all directions x4 trips  Knee/Hip Exercises: Seated   Long Arc Quad Right;Left;2 sets;10 reps    Long Arc Quad Limitations 1.5#; with ball squeeze      Ankle Exercises: Seated   Other Seated Ankle Exercises dorsiflexion; yellow tband; x10 Rt/Lt    Other Seated Ankle Exercises inversion; yellow tband; x10 Lt/Rt                    PT Education - 01/07/21 1314     Education Details standing toe raises, seated ankle iversion    Person(s) Educated Patient    Methods Explanation;Demonstration;Tactile cues;Verbal cues;Handout    Comprehension Verbalized understanding;Returned demonstration;Verbal cues required;Tactile cues required              PT Short Term Goals - 10/30/20 1516       PT SHORT TERM GOAL #1   Title Patient will be independent with HEP for continued progression at home.    Time 6    Period Weeks    Status Achieved    Target Date 10/22/20      PT SHORT TERM GOAL #2   Title Patient will improve shoulder flexion to 120 degrees bilaterally for improved overhead reaching.    Baseline Lt 102;  Rt 95    Time 6    Period Weeks    Status Achieved   Lt 127/Rt 159   Target Date 10/22/20      PT SHORT TERM GOAL #3   Title Patient will report no more than 4/10 pain for two consecutive sessions to indicate improved UE activity tolerance to more readily complete ADLs    Time 6    Period Weeks    Status Achieved   previously achieved   Target Date 10/22/20               PT Long Term Goals - 01/07/21 1659       PT LONG TERM GOAL #6   Title Patient will ascend/descend 3 steps carrying at least 10# bilaterally to more readily perform ADLs.    Time 8    Period Weeks    Status On-going   x10 step ups Lt/Rt carrying 5lbs                  Plan - 01/07/21 1655     Clinical Impression Statement Patient with continued complaints of ankle weakness, Rt > Lt. Upon assessment therapist noting Rt inversion and dorsiflexion to be 4/5 and Lt inversion to be 4/5. Patient reporting significant fatigue of Rt quad following LAQ exercise. She would benefit from continued skilled intervention for improved functional mobility and activity tolerance.    Personal Factors and Comorbidities Comorbidity 3+    Comorbidities arthritis, cancer, depression    Examination-Activity Limitations Carry;Caring for Others;Reach Overhead;Sleep    Examination-Participation Restrictions Meal Prep;Cleaning;Laundry    Rehab Potential Good    PT Frequency 1x / week    PT Duration 8 weeks    PT Treatment/Interventions ADLs/Self Care Home Management;Cryotherapy;Electrical Stimulation;Iontophoresis '4mg'$ /ml Dexamethasone;Moist Heat;Traction;Therapeutic activities;Therapeutic exercise;Neuromuscular re-education;Patient/family education;Manual techniques;Passive range of motion;Dry needling;Taping;Spinal Manipulations;Joint Manipulations;Functional mobility training;Gait training;Balance training    PT Next Visit Plan continue bil LE strengthening with focus on knee and ankle stabilization    PT Home Exercise Plan  Access Code: [email or web address]    Consulted and Agree with Plan of Care Patient             Patient will benefit from skilled therapeutic intervention in order to improve the following deficits and impairments:  Decreased activity tolerance, Decreased range of motion, Decreased strength, Hypomobility, Increased muscle spasms, Impaired UE functional use, Postural dysfunction, Improper body mechanics, Pain  Visit Diagnosis: Cramp and spasm  Chronic left shoulder pain  Chronic right shoulder pain  Pain in left hip  Pain in right hip  Muscle weakness (generalized)  Chronic pain of left knee  Right knee pain, unspecified chronicity     Problem List Patient Active Problem List   Diagnosis Date Noted   Mitral valve prolapse    Aortic regurgitation    NSVT (nonsustained ventricular tachycardia) ([place]) 09/25/2020   Pre-diabetes 08/27/2020   Chronic pain of both shoulders 05/05/2018   Vitamin D deficiency 05/05/2018   Other fatigue 01/02/2018   Allergic rhinitis 01/02/2018   Weight gain 01/02/2018   PVC (premature ventricular contraction) 03/09/2017   Routine general medical examination at a health care facility 09/01/2015   Hyperlipidemia 09/01/2015   Varicose vein 08/27/2014   Primary localized osteoarthrosis, lower leg 10/30/2013   Anxiety state 03/23/2013   RVOT ventricular tachycardia/PVCs 01/31/2013   Depression 01/01/2013   Abnormal stress echo 12/11/2012   Palpitations 09/14/2010   Everardo All PT, DPT  01/07/21 5:03 PM   [facility] [address] [place], [place], [address] Phone: [phone]   Fax:  [phone]  Name: ZULY LOMBARDOZZI MRN: [id number] Date of Birth: October 10, 1950

## 2021-01-07 NOTE — Patient Instructions (Signed)
Toe Raises with Counter Support - 1 x daily - 7 x weekly - 2 sets - 10 reps Seated Figure 4 Ankle Inversion with Resistance - 1 x daily - 7 x weekly - 2 sets - 10 reps

## 2021-01-14 ENCOUNTER — Ambulatory Visit: Payer: PPO | Attending: Internal Medicine

## 2021-01-14 ENCOUNTER — Other Ambulatory Visit: Payer: Self-pay

## 2021-01-14 DIAGNOSIS — M25512 Pain in left shoulder: Secondary | ICD-10-CM | POA: Diagnosis not present

## 2021-01-14 DIAGNOSIS — M25511 Pain in right shoulder: Secondary | ICD-10-CM | POA: Insufficient documentation

## 2021-01-14 DIAGNOSIS — M25551 Pain in right hip: Secondary | ICD-10-CM | POA: Insufficient documentation

## 2021-01-14 DIAGNOSIS — M25552 Pain in left hip: Secondary | ICD-10-CM | POA: Diagnosis not present

## 2021-01-14 DIAGNOSIS — M6281 Muscle weakness (generalized): Secondary | ICD-10-CM | POA: Diagnosis not present

## 2021-01-14 DIAGNOSIS — M25562 Pain in left knee: Secondary | ICD-10-CM | POA: Insufficient documentation

## 2021-01-14 DIAGNOSIS — M25561 Pain in right knee: Secondary | ICD-10-CM | POA: Insufficient documentation

## 2021-01-14 DIAGNOSIS — R252 Cramp and spasm: Secondary | ICD-10-CM | POA: Diagnosis not present

## 2021-01-14 DIAGNOSIS — G8929 Other chronic pain: Secondary | ICD-10-CM | POA: Insufficient documentation

## 2021-01-14 NOTE — Therapy (Signed)
[facility] [address], [place], [place], [address] Phone: [phone]   Fax:  [phone]  Physical Therapy Treatment  Patient Details  Name: Jean Smith MRN: [id number] Date of Birth: 1951/02/19 Referring Provider (PT): Pricilla Holm, MD   Encounter Date: 01/14/2021   PT End of Session - 01/14/21 1225     Visit Number 24    Date for PT Re-Evaluation 01/28/21    Authorization Type [employer]    Progress Note Due on Visit 28    PT Start Time [id number]    PT Stop Time 1230    PT Time Calculation (min) 41 min    Activity Tolerance Patient tolerated treatment well;No increased pain    Behavior During Therapy [facility] for tasks assessed/performed             Past Medical History:  Diagnosis Date   Allergy    Anxiety    Aortic regurgitation    moderate by echo 10/2020   Arthritis    Cancer ([place])    skin   Cataract    bilateral - MD is just watching    Diverticulosis    Fuchs' endothelial dystrophy    follows with optho regularly    Gestational diabetes    Heart murmur    MVP    History of depression    HSV-2 infection    Hyperlipidemia    no meds - diet controlled   Hyperplastic colon polyp    Mitral valve prolapse    mild to moderate MR by echo 10/2020   PVC's (premature ventricular contractions)    intol of BB, sxc palpitations r/t stress   RVOT ventricular tachycardia/PVCs    EP eval 01/2013 for freq PVCs    Past Surgical History:  Procedure Laterality Date   CESAREAN SECTION     x 3   COLONOSCOPY     KNEE SURGERY Right    MANDIBLE SURGERY     right side in front of ear   MOHS SURGERY  2021   POLYPECTOMY     WISDOM TOOTH EXTRACTION      There were no vitals filed for this visit.   Subjective Assessment - 01/14/21 1151     Subjective I didn't sleep well last night.    Currently in Pain? No/denies                               [facility] Adult PT  Treatment/Exercise - 01/14/21 0001       Knee/Hip Exercises: Aerobic   Recumbent Bike L2 x 8 minutes: PT present to discuss progress and assess response to activity      Knee/Hip Exercises: Standing   Heel Raises Both;1 set;20 reps    Hip Abduction Right;Left;2 sets;10 reps;Knee straight    Abduction Limitations blue loop    Hip Extension Right;Left;2 sets;10 reps;Knee straight    Extension Limitations blue loop    Forward Step Up Right;Left;10 reps;Hand Hold: 1;Step Height: 6"    Forward Step Up Limitations 5# dumbbell    Other Standing Knee Exercises resisted walking at power tower; 25lbs; all directions x4 trips      Ankle Exercises: Seated   Other Seated Ankle Exercises inversion; yellow tband; x10 Lt/Rt                      PT Short Term Goals - 10/30/20 1516  PT SHORT TERM GOAL #1   Title Patient will be independent with HEP for continued progression at home.    Time 6    Period Weeks    Status Achieved    Target Date 10/22/20      PT SHORT TERM GOAL #2   Title Patient will improve shoulder flexion to 120 degrees bilaterally for improved overhead reaching.    Baseline Lt 102; Rt 95    Time 6    Period Weeks    Status Achieved   Lt 127/Rt 159   Target Date 10/22/20      PT SHORT TERM GOAL #3   Title Patient will report no more than 4/10 pain for two consecutive sessions to indicate improved UE activity tolerance to more readily complete ADLs    Time 6    Period Weeks    Status Achieved   previously achieved   Target Date 10/22/20               PT Long Term Goals - 01/07/21 1659       PT LONG TERM GOAL #6   Title Patient will ascend/descend 3 steps carrying at least 10# bilaterally to more readily perform ADLs.    Time 8    Period Weeks    Status On-going   x10 step ups Lt/Rt carrying 5lbs                  Plan - 01/14/21 1203     Clinical Impression Statement Patient with continued complaints of ankle weakness, Rt > Lt.   Pt is having difficulty with theraband exercises so PT reviewed technique with her and she was able to demo correctly. Pt did well with strength exercises today.  PT provided verbal cues and supervison for safety.  Pt will benefit from continued skilled intervention for improved functional mobility and activity tolerance.    PT Treatment/Interventions ADLs/Self Care Home Management;Cryotherapy;Electrical Stimulation;Iontophoresis '4mg'$ /ml Dexamethasone;Moist Heat;Traction;Therapeutic activities;Therapeutic exercise;Neuromuscular re-education;Patient/family education;Manual techniques;Passive range of motion;Dry needling;Taping;Spinal Manipulations;Joint Manipulations;Functional mobility training;Gait training;Balance training    PT Next Visit Plan continue bil LE strengthening with focus on knee and ankle stabilization    PT Home Exercise Plan Access Code: [email or web address]             Patient will benefit from skilled therapeutic intervention in order to improve the following deficits and impairments:  Decreased activity tolerance, Decreased range of motion, Decreased strength, Hypomobility, Increased muscle spasms, Impaired UE functional use, Postural dysfunction, Improper body mechanics, Pain  Visit Diagnosis: Cramp and spasm  Chronic left shoulder pain  Chronic right shoulder pain  Pain in left hip  Pain in right hip  Muscle weakness (generalized)     Problem List Patient Active Problem List   Diagnosis Date Noted   Mitral valve prolapse    Aortic regurgitation    NSVT (nonsustained ventricular tachycardia) ([place]) 09/25/2020   Pre-diabetes 08/27/2020   Chronic pain of both shoulders 05/05/2018   Vitamin D deficiency 05/05/2018   Other fatigue 01/02/2018   Allergic rhinitis 01/02/2018   Weight gain 01/02/2018   PVC (premature ventricular contraction) 03/09/2017   Routine general medical examination at a health care facility 09/01/2015   Hyperlipidemia 09/01/2015   Varicose vein  08/27/2014   Primary localized osteoarthrosis, lower leg 10/30/2013   Anxiety state 03/23/2013   RVOT ventricular tachycardia/PVCs 01/31/2013   Depression 01/01/2013   Abnormal stress echo 12/11/2012   Palpitations 09/14/2010    Sigurd Sos, PT 01/14/21 12:30 PM  [facility] [address] [place], [place], [address] Phone: [phone]   Fax:  [phone]  Name: SUNDEEP HOLDERFIELD MRN: [id number] Date of Birth: [birth year]

## 2021-01-21 ENCOUNTER — Other Ambulatory Visit: Payer: Self-pay

## 2021-01-21 ENCOUNTER — Ambulatory Visit: Payer: PPO | Admitting: Physical Therapy

## 2021-01-21 DIAGNOSIS — M25561 Pain in right knee: Secondary | ICD-10-CM

## 2021-01-21 DIAGNOSIS — M25552 Pain in left hip: Secondary | ICD-10-CM

## 2021-01-21 DIAGNOSIS — M25512 Pain in left shoulder: Secondary | ICD-10-CM

## 2021-01-21 DIAGNOSIS — G8929 Other chronic pain: Secondary | ICD-10-CM

## 2021-01-21 DIAGNOSIS — M6281 Muscle weakness (generalized): Secondary | ICD-10-CM

## 2021-01-21 DIAGNOSIS — R252 Cramp and spasm: Secondary | ICD-10-CM

## 2021-01-21 DIAGNOSIS — M25551 Pain in right hip: Secondary | ICD-10-CM

## 2021-01-21 NOTE — Therapy (Signed)
[facility] [address], [place], [place], [address] Phone: [phone]   Fax:  [phone]  Physical Therapy Treatment  Patient Details  Name: Jean Smith MRN: [id number] Date of Birth: 1950/09/23 Referring Provider (PT): Pricilla Holm, MD   Encounter Date: 01/21/2021   PT End of Session - 01/21/21 1712     Visit Number 25    Date for PT Re-Evaluation 01/28/21    Authorization Type [employer]    Progress Note Due on Visit 28    PT Start Time 1147    PT Stop Time 1230    PT Time Calculation (min) 43 min    Equipment Utilized During Treatment Gait belt    Activity Tolerance Patient tolerated treatment well;No increased pain    Behavior During Therapy [facility] for tasks assessed/performed             Past Medical History:  Diagnosis Date   Allergy    Anxiety    Aortic regurgitation    moderate by echo 10/2020   Arthritis    Cancer ([place])    skin   Cataract    bilateral - MD is just watching    Diverticulosis    Fuchs' endothelial dystrophy    follows with optho regularly    Gestational diabetes    Heart murmur    MVP    History of depression    HSV-2 infection    Hyperlipidemia    no meds - diet controlled   Hyperplastic colon polyp    Mitral valve prolapse    mild to moderate MR by echo 10/2020   PVC's (premature ventricular contractions)    intol of BB, sxc palpitations r/t stress   RVOT ventricular tachycardia/PVCs    EP eval 01/2013 for freq PVCs    Past Surgical History:  Procedure Laterality Date   CESAREAN SECTION     x 3   COLONOSCOPY     KNEE SURGERY Right    MANDIBLE SURGERY     right side in front of ear   MOHS SURGERY  2021   POLYPECTOMY     WISDOM TOOTH EXTRACTION      There were no vitals filed for this visit.   Subjective Assessment - 01/21/21 1709     Subjective Still feels that ankles are weak.    Pertinent History arthritis, history of cancer,  depression    Limitations House hold activities    How long can you sit comfortably? unlimited    How long can you stand comfortably? unlimited    How long can you walk comfortably? unlimited    Diagnostic tests None    Currently in Pain? No/denies                               [facility] Adult PT Treatment/Exercise - 01/21/21 0001       Neuro Re-ed    Neuro Re-ed Details  hurdle step over: one at a time x4 trips; reciprocal step over x4 trips; lateral step over x4 trips      Knee/Hip Exercises: Standing   Forward Step Up Right;Left;1 set;10 reps;Step Height: 6"    Forward Step Up Limitations 5# dumbbell each hand    Other Standing Knee Exercises monster walk; red tband at knees; x3 trips across carpet    Other Standing Knee Exercises lateral stepping; yellow loop at feet; x2 trips across carpet  PT Short Term Goals - 10/30/20 1516       PT SHORT TERM GOAL #1   Title Patient will be independent with HEP for continued progression at home.    Time 6    Period Weeks    Status Achieved    Target Date 10/22/20      PT SHORT TERM GOAL #2   Title Patient will improve shoulder flexion to 120 degrees bilaterally for improved overhead reaching.    Baseline Lt 102; Rt 95    Time 6    Period Weeks    Status Achieved   Lt 127/Rt 159   Target Date 10/22/20      PT SHORT TERM GOAL #3   Title Patient will report no more than 4/10 pain for two consecutive sessions to indicate improved UE activity tolerance to more readily complete ADLs    Time 6    Period Weeks    Status Achieved   previously achieved   Target Date 10/22/20               PT Long Term Goals - 01/21/21 1711       PT LONG TERM GOAL #1   Title Patient will be independent with advanced HEP for long term management of symptoms post D/C.    Time 12    Period Weeks    Status On-going   patient reports partial compliance     PT LONG TERM GOAL #2   Title Patient  will improved FOTO score to 64 or higher to indicate improved overall function.    Baseline 50    Time 12    Period Weeks    Status On-going      PT LONG TERM GOAL #4   Title Patient will raise/lower 4# from waist to overhead height without increased pain for improved ability to complete ADLs.    Time 12    Period Weeks    Status Achieved      PT LONG TERM GOAL #6   Title Patient will ascend/descend 3 steps carrying at least 10# bilaterally to more readily perform ADLs.    Time 8    Period Weeks    Status On-going      PT LONG TERM GOAL #7   Title Patient will report 80% greater ease transitioning out of car for improved community accessibility.    Time 8    Period Weeks    Status On-going   50%     PT LONG TERM GOAL #8   Title Patient will ambulate at least 1.5 miles with RPE </= 5 to indicate improved community activity tolerance.    Time 8    Period Weeks    Status On-going                   Plan - 01/21/21 1709     Clinical Impression Statement Patient reporting feelings of instability during balance exercises despite minimal evidence of postural instability. Verbal cues for decreased knee valgus when performing monster walk activity. Anticipate patient readiness for D/C at next session as patient is prepared for D/C from objective measurements.    Personal Factors and Comorbidities Comorbidity 3+    Comorbidities arthritis, cancer, depression    Examination-Activity Limitations Carry;Caring for Others;Reach Overhead;Sleep    Examination-Participation Restrictions Meal Prep;Cleaning;Laundry    Rehab Potential Good    PT Frequency 1x / week    PT Duration 8 weeks    PT Treatment/Interventions ADLs/Self Care Home  Management;Cryotherapy;Electrical Stimulation;Iontophoresis '4mg'$ /ml Dexamethasone;Moist Heat;Traction;Therapeutic activities;Therapeutic exercise;Neuromuscular re-education;Patient/family education;Manual techniques;Passive range of motion;Dry  needling;Taping;Spinal Manipulations;Joint Manipulations;Functional mobility training;Gait training;Balance training    PT Next Visit Plan D/C to HEP    PT Home Exercise Plan Access Code: [email or web address]    Consulted and Agree with Plan of Care Patient             Patient will benefit from skilled therapeutic intervention in order to improve the following deficits and impairments:  Decreased activity tolerance, Decreased range of motion, Decreased strength, Hypomobility, Increased muscle spasms, Impaired UE functional use, Postural dysfunction, Improper body mechanics, Pain  Visit Diagnosis: Cramp and spasm  Chronic left shoulder pain  Chronic right shoulder pain  Pain in left hip  Pain in right hip  Muscle weakness (generalized)  Chronic pain of left knee  Right knee pain, unspecified chronicity     Problem List Patient Active Problem List   Diagnosis Date Noted   Mitral valve prolapse    Aortic regurgitation    NSVT (nonsustained ventricular tachycardia) ([place]) 09/25/2020   Pre-diabetes 08/27/2020   Chronic pain of both shoulders 05/05/2018   Vitamin D deficiency 05/05/2018   Other fatigue 01/02/2018   Allergic rhinitis 01/02/2018   Weight gain 01/02/2018   PVC (premature ventricular contraction) 03/09/2017   Routine general medical examination at a health care facility 09/01/2015   Hyperlipidemia 09/01/2015   Varicose vein 08/27/2014   Primary localized osteoarthrosis, lower leg 10/30/2013   Anxiety state 03/23/2013   RVOT ventricular tachycardia/PVCs 01/31/2013   Depression 01/01/2013   Abnormal stress echo 12/11/2012   Palpitations 09/14/2010   Everardo All PT, DPT 01/21/21 5:13 PM   [facility] [address], [place], [place], [address] Phone: [phone]   Fax:  [phone]  Name: Jean Smith MRN: [id number] Date of Birth: [birth year]

## 2021-01-28 ENCOUNTER — Ambulatory Visit: Payer: PPO | Admitting: Physical Therapy

## 2021-02-04 ENCOUNTER — Ambulatory Visit: Payer: PPO | Admitting: Physical Therapy

## 2021-02-04 ENCOUNTER — Other Ambulatory Visit: Payer: Self-pay

## 2021-02-04 ENCOUNTER — Encounter: Payer: PPO | Admitting: Physical Therapy

## 2021-02-04 DIAGNOSIS — R252 Cramp and spasm: Secondary | ICD-10-CM

## 2021-02-04 DIAGNOSIS — M25551 Pain in right hip: Secondary | ICD-10-CM

## 2021-02-04 DIAGNOSIS — M25552 Pain in left hip: Secondary | ICD-10-CM

## 2021-02-04 DIAGNOSIS — G8929 Other chronic pain: Secondary | ICD-10-CM

## 2021-02-04 DIAGNOSIS — M6281 Muscle weakness (generalized): Secondary | ICD-10-CM

## 2021-02-04 DIAGNOSIS — M25561 Pain in right knee: Secondary | ICD-10-CM

## 2021-02-04 DIAGNOSIS — M25512 Pain in left shoulder: Secondary | ICD-10-CM

## 2021-02-04 NOTE — Therapy (Signed)
[facility] [address], [place], [place], [address] Phone: [phone]   Fax:  [phone]  Physical Therapy Treatment  Patient Details  Name: MADILYNNE MULLAN MRN: [id number] Date of Birth: October 11, 1950 Referring Provider (PT): Pricilla Holm, MD   Encounter Date: 02/04/2021   PT End of Session - 02/04/21 1708     Visit Number 26    Date for PT Re-Evaluation 01/28/21    Authorization Type [employer]    Progress Note Due on Visit 28    PT Start Time 1528    PT Stop Time 1550    PT Time Calculation (min) 22 min    Activity Tolerance Patient tolerated treatment well;No increased pain    Behavior During Therapy [facility] for tasks assessed/performed             Past Medical History:  Diagnosis Date   Allergy    Anxiety    Aortic regurgitation    moderate by echo 10/2020   Arthritis    Cancer ([place])    skin   Cataract    bilateral - MD is just watching    Diverticulosis    Fuchs' endothelial dystrophy    follows with optho regularly    Gestational diabetes    Heart murmur    MVP    History of depression    HSV-2 infection    Hyperlipidemia    no meds - diet controlled   Hyperplastic colon polyp    Mitral valve prolapse    mild to moderate MR by echo 10/2020   PVC's (premature ventricular contractions)    intol of BB, sxc palpitations r/t stress   RVOT ventricular tachycardia/PVCs    EP eval 01/2013 for freq PVCs    Past Surgical History:  Procedure Laterality Date   CESAREAN SECTION     x 3   COLONOSCOPY     KNEE SURGERY Right    MANDIBLE SURGERY     right side in front of ear   MOHS SURGERY  2021   POLYPECTOMY     WISDOM TOOTH EXTRACTION      There were no vitals filed for this visit.   Subjective Assessment - 02/04/21 1701     Subjective Patient reporting anterior Rt thigh and knee pain that began while she was moving some heavier items. States that she noted some Rt knee  swelling but this has been well controlled with ice.    Pertinent History arthritis, history of cancer, depression    Limitations House hold activities    How long can you sit comfortably? unlimited    How long can you stand comfortably? unlimited    How long can you walk comfortably? unlimited    Diagnostic tests None    Currently in Pain? Yes    Pain Score 2     Pain Location Knee    Pain Orientation Right    Pain Descriptors / Indicators Discomfort    Pain Type Acute pain    Pain Onset 1 to 4 weeks ago                [facility] PT Assessment - 02/04/21 0001       Assessment   Medical Diagnosis M25.511,G89.29,M25.512 (ICD-10-CM) - Chronic pain of both shoulders, M25.551,M25.552,G89.29 (ICD-10-CM) - Chronic pain of both hips  M25.561,M25.562,G89.29 (ICD-10-CM) - Chronic pain of both knees    Referring Provider (PT) Pricilla Holm, MD    Hand Dominance Right  Next MD Visit 6 weeks from today    Prior Therapy Yes- Rt shoulder 2020      Precautions   Precautions None      Restrictions   Weight Bearing Restrictions No      Balance Screen   Has the patient fallen in the past 6 months No    Has the patient had a decrease in activity level because of a fear of falling?  No    Is the patient reluctant to leave their home because of a fear of falling?  No      Prior Function   Level of Independence Independent      Cognition   Overall Cognitive Status Within Functional Limits for tasks assessed      Observation/Other Assessments   Focus on Therapeutic Outcomes (FOTO)  60 (goal 64)      AROM   Right Shoulder ABduction 150 Degrees    Left Shoulder Flexion 160 Degrees                           [facility] Adult PT Treatment/Exercise - 02/04/21 0001       Therapeutic Activites    Therapeutic Activities Other Therapeutic Activities    Other Therapeutic Activities stair climbing with 10# weight; up/down x1      Knee/Hip Exercises: Seated   Long Arc Quad  Right;Left;2 sets;10 reps    Long Arc Quad Limitations yellow band at knees                      PT Short Term Goals - 10/30/20 1516       PT SHORT TERM GOAL #1   Title Patient will be independent with HEP for continued progression at home.    Time 6    Period Weeks    Status Achieved    Target Date 10/22/20      PT SHORT TERM GOAL #2   Title Patient will improve shoulder flexion to 120 degrees bilaterally for improved overhead reaching.    Baseline Lt 102; Rt 95    Time 6    Period Weeks    Status Achieved   Lt 127/Rt 159   Target Date 10/22/20      PT SHORT TERM GOAL #3   Title Patient will report no more than 4/10 pain for two consecutive sessions to indicate improved UE activity tolerance to more readily complete ADLs    Time 6    Period Weeks    Status Achieved   previously achieved   Target Date 10/22/20               PT Long Term Goals - 02/04/21 1704       PT LONG TERM GOAL #1   Title Patient will be independent with advanced HEP for long term management of symptoms post D/C.    Time 12    Period Weeks    Status Achieved      PT LONG TERM GOAL #2   Title Patient will improved FOTO score to 64 or higher to indicate improved overall function.    Baseline 50    Time 12    Period Weeks    Status Not Met   60     PT LONG TERM GOAL #3   Title Patient will demonstrate 140 degrees flexion bilaterally for improved overhead reaching    Time 12    Period Weeks  Status Achieved      PT LONG TERM GOAL #4   Title Patient will raise/lower 4# from waist to overhead height without increased pain for improved ability to complete ADLs.    Time 12    Period Weeks    Status Achieved      PT LONG TERM GOAL #5   Title Patient will complete five times sit to stand in <13s to indicate decreased fall risk    Baseline 22s    Time 9    Period Weeks    Status Achieved      PT LONG TERM GOAL #6   Title Patient will ascend/descend 3 steps carrying at  least 10# bilaterally to more readily perform ADLs.    Time 8    Period Weeks    Status Partially Met   carrying 5# bilaterally     PT LONG TERM GOAL #7   Title Patient will report 80% greater ease transitioning out of car for improved community accessibility.    Time 8    Period Weeks    Status Partially Met   70%     PT LONG TERM GOAL #8   Title Patient will ambulate at least 1.5 miles with RPE </= 5 to indicate improved community activity tolerance.    Time 8    Period Weeks    Status Not Met   patient has not attempted                  Plan - 02/04/21 1702     Clinical Impression Statement Patient is a 70 y/o female referred due to chronic bilateral shoulder,hip, and knee pain. PMH includes arthritis, cancer, and depression. Patient demonstrates significantly improved bilateral shoulder AROM. Functional mobility improved as patient meeting five times sit to stand goal. She subjectively reports improved mobility as she reports 70% improvement in transitioning in/out of car. Patient did not meet FOTO goal however, score significantly improved as compared to initial visit. Will D/C to HEP.    Personal Factors and Comorbidities Comorbidity 3+    Comorbidities arthritis, cancer, depression    Examination-Activity Limitations Carry;Caring for Others;Reach Overhead;Sleep    Examination-Participation Restrictions Meal Prep;Cleaning;Laundry    Rehab Potential Good    PT Frequency 1x / week    PT Duration 8 weeks    PT Treatment/Interventions ADLs/Self Care Home Management;Cryotherapy;Electrical Stimulation;Iontophoresis 28m/ml Dexamethasone;Moist Heat;Traction;Therapeutic activities;Therapeutic exercise;Neuromuscular re-education;Patient/family education;Manual techniques;Passive range of motion;Dry needling;Taping;Spinal Manipulations;Joint Manipulations;Functional mobility training;Gait training;Balance training    PT Next Visit Plan D/C to HEP    PT Home Exercise Plan Access  Code: [email or web address]   Consulted and Agree with Plan of Care Patient             Patient will benefit from skilled therapeutic intervention in order to improve the following deficits and impairments:  Decreased activity tolerance, Decreased range of motion, Decreased strength, Hypomobility, Increased muscle spasms, Impaired UE functional use, Postural dysfunction, Improper body mechanics, Pain  Visit Diagnosis: Cramp and spasm  Chronic left shoulder pain  Chronic right shoulder pain  Pain in left hip  Pain in right hip  Muscle weakness (generalized)  Chronic pain of left knee  Right knee pain, unspecified chronicity     Problem List Patient Active Problem List   Diagnosis Date Noted   Mitral valve prolapse    Aortic regurgitation    NSVT (nonsustained ventricular tachycardia) [place] 09/25/2020   Pre-diabetes 08/27/2020   Chronic pain of both shoulders 05/05/2018   Vitamin  D deficiency 05/05/2018   Other fatigue 01/02/2018   Allergic rhinitis 01/02/2018   Weight gain 01/02/2018   PVC (premature ventricular contraction) 03/09/2017   Routine general medical examination at a health care facility 09/01/2015   Hyperlipidemia 09/01/2015   Varicose vein 08/27/2014   Primary localized osteoarthrosis, lower leg 10/30/2013   Anxiety state 03/23/2013   RVOT ventricular tachycardia/PVCs 01/31/2013   Depression 01/01/2013   Abnormal stress echo 12/11/2012   Palpitations 09/14/2010   PHYSICAL THERAPY DISCHARGE SUMMARY  Visits from Start of Care: 26  Current functional level related to goals / functional outcomes: See above   Remaining deficits: See above   Education / Equipment: See above   Patient agrees to discharge. Patient goals were partially met. Patient is being discharged due to maximized rehab potential.   Everardo All PT, DPT 02/04/21 5:10 PM    [facility] [address], [place], [place],  [address] Phone: [phone]   Fax:  [phone]  Name: DEVETTA HAGENOW MRN: [id number] Date of Birth: 12-30-1950

## 2021-02-24 ENCOUNTER — Other Ambulatory Visit: Payer: Self-pay

## 2021-02-25 ENCOUNTER — Ambulatory Visit (INDEPENDENT_AMBULATORY_CARE_PROVIDER_SITE_OTHER): Payer: PPO | Admitting: Internal Medicine

## 2021-02-25 ENCOUNTER — Encounter: Payer: Self-pay | Admitting: Internal Medicine

## 2021-02-25 VITALS — BP 124/84 | HR 75 | Temp 97.6°F | Resp 18 | Ht 63.5 in | Wt 170.8 lb

## 2021-02-25 DIAGNOSIS — R7303 Prediabetes: Secondary | ICD-10-CM

## 2021-02-25 DIAGNOSIS — R635 Abnormal weight gain: Secondary | ICD-10-CM

## 2021-02-25 LAB — COMPREHENSIVE METABOLIC PANEL
ALT: 11 U/L (ref 0–35)
AST: 14 U/L (ref 0–37)
Albumin: 4.1 g/dL (ref 3.5–5.2)
Alkaline Phosphatase: 54 U/L (ref 39–117)
BUN: 12 mg/dL (ref 6–23)
CO2: 29 mEq/L (ref 19–32)
Calcium: 9.9 mg/dL (ref 8.4–10.5)
Chloride: 100 mEq/L (ref 96–112)
Creatinine, Ser: 0.76 mg/dL (ref 0.40–1.20)
GFR: 80.56 mL/min (ref >60.00)
Glucose, Bld: 98 mg/dL (ref 70–99)
Potassium: 4 mEq/L (ref 3.5–5.1)
Sodium: 138 mEq/L (ref 135–145)
Total Bilirubin: 0.4 mg/dL (ref 0.2–1.2)
Total Protein: 7.3 g/dL (ref 6.0–8.3)

## 2021-02-25 LAB — POCT GLYCOSYLATED HEMOGLOBIN (HGB A1C): Hemoglobin A1C: 6.4 % — AB (ref 4.0–5.6)

## 2021-02-25 LAB — LIPID PANEL
Cholesterol: 277 mg/dL — ABNORMAL HIGH (ref 0–200)
HDL: 63.8 mg/dL (ref >39.00)
LDL Cholesterol: 186 mg/dL — ABNORMAL HIGH (ref 0–99)
NonHDL: 213.56
Total CHOL/HDL Ratio: 4
Triglycerides: 139 mg/dL (ref 0.0–149.0)
VLDL: 27.8 mg/dL (ref 0.0–40.0)

## 2021-02-25 LAB — CBC
HCT: 39.6 % (ref 36.0–46.0)
Hemoglobin: 13.3 g/dL (ref 12.0–15.0)
MCHC: 33.6 g/dL (ref 30.0–36.0)
MCV: 86.6 fl (ref 78.0–100.0)
Platelets: 224 10*3/uL (ref 150.0–400.0)
RBC: 4.57 Mil/uL (ref 3.87–5.11)
RDW: 13.7 % (ref 11.5–15.5)
WBC: 4.7 10*3/uL (ref 4.0–10.5)

## 2021-02-25 NOTE — Assessment & Plan Note (Signed)
POC HgA1c done in office at 6.4 today. Counseled about dietary changes and she would benefit from nutrition so referral placed today to help on a granular level to meal plan. She does a lot of late night snacking.

## 2021-02-25 NOTE — Progress Notes (Signed)
   Subjective:   Patient ID: Jean Smith, female    DOB: April 27, 1951, 70 y.o.   MRN: [id number]  Diabetes Pertinent negatives for diabetes include no chest pain.  Insomnia  The patient is a 70 YO female coming in for follow up pre-diabetes and having insomnia. Working on losing weight but not doing well with that. Considering cholesterol medicine.   Review of Systems  Constitutional:  Positive for unexpected weight change.  HENT: Negative.    Eyes: Negative.   Respiratory:  Negative for cough, chest tightness and shortness of breath.   Cardiovascular:  Negative for chest pain, palpitations and leg swelling.  Gastrointestinal:  Negative for abdominal distention, abdominal pain, constipation, diarrhea, nausea and vomiting.  Musculoskeletal: Negative.   Skin: Negative.   Neurological: Negative.   Psychiatric/Behavioral:  Positive for dysphoric mood. The patient has insomnia.    Objective:  Physical Exam Constitutional:      Appearance: She is well-developed.  HENT:     Head: Normocephalic and atraumatic.  Cardiovascular:     Rate and Rhythm: Normal rate and regular rhythm.  Pulmonary:     Effort: Pulmonary effort is normal. No respiratory distress.     Breath sounds: Normal breath sounds. No wheezing or rales.  Abdominal:     General: Bowel sounds are normal. There is no distension.     Palpations: Abdomen is soft.     Tenderness: There is no abdominal tenderness. There is no rebound.  Musculoskeletal:     Cervical back: Normal range of motion.  Skin:    General: Skin is warm and dry.  Neurological:     Mental Status: She is alert and oriented to person, place, and time.     Coordination: Coordination normal.    Vitals:   02/25/21 1007  BP: 124/84  Pulse: 75  Resp: 18  Temp: 97.6 F (36.4 C)  TempSrc: Oral  SpO2: 97%  Weight: 170 lb 12.8 oz (77.5 kg)  Height: 5' 3.5" (1.613 m)    This visit occurred during the SARS-CoV-2 public health emergency.  Safety  protocols were in place, including screening questions prior to the visit, additional usage of staff PPE, and extensive cleaning of exam room while observing appropriate contact time as indicated for disinfecting solutions.   Assessment & Plan:

## 2021-02-25 NOTE — Assessment & Plan Note (Signed)
We talked about how stress can inhibit weight loss and insulin resistance can contribute to weight gain as well. Referral to nutrition to help with her concurrent diabetes.

## 2021-02-25 NOTE — Patient Instructions (Addendum)
Your HgA1c is 6.4 today which is pre-diabetes and very close to diabetes. We will have you talk to a nutritionist to work on the diet.

## 2021-03-05 ENCOUNTER — Telehealth: Payer: Self-pay | Admitting: Gastroenterology

## 2021-03-05 NOTE — Telephone Encounter (Signed)
Okay to proceed with antibiotic therapy for empiric treatment of possible diverticulitis. Ciprofloxacin 500 mg twice daily plus Flagyl 500 mg twice daily x10 days. Please give patient strict instructions that if pain worsens or progresses or she is not improving with antibiotic therapy within the next 24 to 36 hours, she should call the on-call GI provider and understand that she likely will be directed to the hospital for further evaluation. Please set up a follow-up call for early next week to see how she has done. Thanks. GM

## 2021-03-05 NOTE — Telephone Encounter (Signed)
Inbound call from pt requesting a call back stating she is experiencing pain in her lower left side. Please advise. Thank you.

## 2021-03-05 NOTE — Telephone Encounter (Signed)
DOD Former patient of Dr Olevia Perches. Last colonoscopy with Dr Silverio Decamp is 2020. Recall for 2023. Known severe diverticulosis of large intestine. Patient calls with complaints of 3 days of worsening left lower abdominal pain. Pressure to the left is uncomfortable and is bending over. Feels similar to when she last had diverticulitis "many years ago." Afebrile. Not constipated. No nausea. She called her PCP first and was told to contact GI. No appointments for a month with APP or MD. Can she be treated empirically and follow up appointment scheduled?

## 2021-03-06 MED ORDER — METRONIDAZOLE 500 MG PO TABS: 500.0000 mg | ORAL_TABLET | Freq: Two times a day (BID) | ORAL | 0 refills | Status: DC

## 2021-03-06 MED ORDER — CIPROFLOXACIN HCL 500 MG PO TABS: 500.0000 mg | ORAL_TABLET | Freq: Two times a day (BID) | ORAL | 0 refills | Status: DC

## 2021-03-06 NOTE — Telephone Encounter (Signed)
Spoke with pt and she is aware of recommendations, scripts sent to pharmacy.

## 2021-03-10 ENCOUNTER — Ambulatory Visit: Payer: PPO | Admitting: Gastroenterology

## 2021-03-24 ENCOUNTER — Inpatient Hospital Stay (HOSPITAL_BASED_OUTPATIENT_CLINIC_OR_DEPARTMENT_OTHER): Admission: RE | Admit: 2021-03-24 | Payer: PPO | Source: Ambulatory Visit | Admitting: Radiology

## 2021-03-31 ENCOUNTER — Telehealth: Payer: Self-pay | Admitting: Cardiology

## 2021-03-31 DIAGNOSIS — I4729 Other ventricular tachycardia: Secondary | ICD-10-CM

## 2021-03-31 DIAGNOSIS — R002 Palpitations: Secondary | ICD-10-CM

## 2021-03-31 NOTE — Telephone Encounter (Signed)
Called patient back about her message. Patient complaining of having palpitations and irregular HR and BP, after having the new vaccine. Patient stated she had flu like symptoms for several days after the vaccine. Patient stated she used the propranolol that helped with the palpitations, but every time she uses it, she has upset stomach the next day. Patient has had palpitations in the past, and Dr. Radford Pax ordered a monitor in April to make sure patient is not having PAF or more VT. Patient could not put on monitor by herself and ended up not wearing it. Patient stated she would be open to getting monitor again, if someone can put it on her. Will send message to Dr. Radford Pax for advisement.

## 2021-03-31 NOTE — Telephone Encounter (Signed)
Patient c/o Palpitations:  High priority if patient c/o lightheadedness, shortness of breath, or chest pain  How long have you had palpitations/irregular HR/ Afib? Are you having the symptoms now?  Patient states she has been having palpitations  on and off for the past 2 weeks. She states the symptoms developed after receiving the COVID vaccine. No symptoms currently.  Are you currently experiencing lightheadedness, SOB or CP?  No   Do you have a history of afib (atrial fibrillation) or irregular heart rhythm?  Yes   Have you checked your BP or HR? (document readings if available):   10/13: 92/60 83/65  73/53  83/46 10/14: 102/67 10/15: 110/58 10/16: 103/60 10/17: 110/66  Are you experiencing any other symptoms?  Occasional lightheadedness

## 2021-03-31 NOTE — Telephone Encounter (Signed)
Dr. Radford Pax is okay with ordering a zio patch for 2 weeks. Will place order and have patient come into the office to have monitor put on. Called patient and she verbalized understanding.

## 2021-04-01 ENCOUNTER — Ambulatory Visit (INDEPENDENT_AMBULATORY_CARE_PROVIDER_SITE_OTHER): Payer: PPO

## 2021-04-01 ENCOUNTER — Other Ambulatory Visit: Payer: Self-pay

## 2021-04-01 DIAGNOSIS — R002 Palpitations: Secondary | ICD-10-CM

## 2021-04-01 DIAGNOSIS — I4729 Other ventricular tachycardia: Secondary | ICD-10-CM

## 2021-04-01 NOTE — Progress Notes (Unsigned)
14 day ZIO XT monitor from office inventory applied to patient.

## 2021-04-06 ENCOUNTER — Ambulatory Visit: Payer: PPO | Admitting: Registered"

## 2021-04-14 ENCOUNTER — Ambulatory Visit (HOSPITAL_BASED_OUTPATIENT_CLINIC_OR_DEPARTMENT_OTHER): Payer: PPO | Admitting: Radiology

## 2021-04-21 ENCOUNTER — Ambulatory Visit (HOSPITAL_BASED_OUTPATIENT_CLINIC_OR_DEPARTMENT_OTHER)
Admission: RE | Admit: 2021-04-21 | Discharge: 2021-04-21 | Disposition: A | Discharge status: Home / Self Care | Payer: PPO | Source: Ambulatory Visit | Attending: Obstetrics & Gynecology | Admitting: Obstetrics & Gynecology

## 2021-04-21 ENCOUNTER — Other Ambulatory Visit: Payer: Self-pay

## 2021-04-21 DIAGNOSIS — Z1231 Encounter for screening mammogram for malignant neoplasm of breast: Secondary | ICD-10-CM | POA: Insufficient documentation

## 2021-04-22 DIAGNOSIS — R002 Palpitations: Secondary | ICD-10-CM | POA: Diagnosis not present

## 2021-04-22 DIAGNOSIS — I4729 Other ventricular tachycardia: Secondary | ICD-10-CM | POA: Diagnosis not present

## 2021-04-23 ENCOUNTER — Telehealth: Payer: Self-pay

## 2021-04-23 DIAGNOSIS — R002 Palpitations: Secondary | ICD-10-CM

## 2021-04-23 DIAGNOSIS — R011 Cardiac murmur, unspecified: Secondary | ICD-10-CM

## 2021-04-23 DIAGNOSIS — I4729 Other ventricular tachycardia: Secondary | ICD-10-CM

## 2021-04-23 DIAGNOSIS — I493 Ventricular premature depolarization: Secondary | ICD-10-CM

## 2021-04-23 NOTE — Telephone Encounter (Signed)
The patient has been notified of the result and verbalized understanding.  All questions (if any) were answered. Antonieta Iba, RN 04/23/2021 3:51 PM  Patient will come in for labs on Monday 11/14. Referral has been placed to Dr. Quentin Ore.

## 2021-04-23 NOTE — Telephone Encounter (Signed)
-----   Message from Sueanne Margarita, MD sent at 04/23/2021 11:37 AM EST ----- Heart monitor showed very frequent episodes of PVCs, bigeminal and trigeminal PVCs as well as ventricular couplets with PVC load high at 17%.  Labs in September 2022 showed normal potassium at 4.  Please have her come in for a TSH and magnesium level.  Please refer her to Dr. Quentin Ore for further evaluation

## 2021-04-27 ENCOUNTER — Other Ambulatory Visit: Payer: PPO

## 2021-04-27 ENCOUNTER — Other Ambulatory Visit: Payer: Self-pay

## 2021-04-27 DIAGNOSIS — I493 Ventricular premature depolarization: Secondary | ICD-10-CM

## 2021-04-27 DIAGNOSIS — R002 Palpitations: Secondary | ICD-10-CM

## 2021-04-27 DIAGNOSIS — R011 Cardiac murmur, unspecified: Secondary | ICD-10-CM | POA: Diagnosis not present

## 2021-04-27 DIAGNOSIS — I4729 Other ventricular tachycardia: Secondary | ICD-10-CM

## 2021-04-27 LAB — TSH: TSH: 2.05 u[IU]/mL (ref 0.450–4.500)

## 2021-04-27 LAB — MAGNESIUM: Magnesium: 2.1 mg/dL (ref 1.6–2.3)

## 2021-05-01 ENCOUNTER — Ambulatory Visit: Payer: PPO | Admitting: Gastroenterology

## 2021-05-01 DIAGNOSIS — L819 Disorder of pigmentation, unspecified: Secondary | ICD-10-CM | POA: Diagnosis not present

## 2021-05-01 DIAGNOSIS — L821 Other seborrheic keratosis: Secondary | ICD-10-CM | POA: Diagnosis not present

## 2021-05-01 DIAGNOSIS — D2261 Melanocytic nevi of right upper limb, including shoulder: Secondary | ICD-10-CM | POA: Diagnosis not present

## 2021-05-01 DIAGNOSIS — D2262 Melanocytic nevi of left upper limb, including shoulder: Secondary | ICD-10-CM | POA: Diagnosis not present

## 2021-05-01 DIAGNOSIS — L570 Actinic keratosis: Secondary | ICD-10-CM | POA: Diagnosis not present

## 2021-05-01 DIAGNOSIS — L720 Epidermal cyst: Secondary | ICD-10-CM | POA: Diagnosis not present

## 2021-05-01 DIAGNOSIS — Z85828 Personal history of other malignant neoplasm of skin: Secondary | ICD-10-CM | POA: Diagnosis not present

## 2021-05-01 DIAGNOSIS — D2271 Melanocytic nevi of right lower limb, including hip: Secondary | ICD-10-CM | POA: Diagnosis not present

## 2021-05-01 DIAGNOSIS — L738 Other specified follicular disorders: Secondary | ICD-10-CM | POA: Diagnosis not present

## 2021-05-01 DIAGNOSIS — D224 Melanocytic nevi of scalp and neck: Secondary | ICD-10-CM | POA: Diagnosis not present

## 2021-05-01 DIAGNOSIS — L814 Other melanin hyperpigmentation: Secondary | ICD-10-CM | POA: Diagnosis not present

## 2021-05-22 ENCOUNTER — Ambulatory Visit: Payer: Self-pay | Admitting: Registered"

## 2021-06-09 NOTE — Progress Notes (Signed)
Electrophysiology Office Note:    Date:  06/10/2021   ID:  KEELI ROBERG, DOB 20-Jun-1950, MRN [id number]  PCP:  Hoyt Koch, MD  [facility] Cardiologist:  Fransico Him, MD  [facility] Electrophysiologist:  Vickie Epley, MD   Referring MD: Sueanne Margarita, MD   Chief Complaint: palpitations, abnormal monitor  History of Present Illness:    Jean Smith is a 70 y.o. female who presents for an evaluation of palpitations and an abnormal monitor at the request of Dr Radford Pax. 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 In the past, her palpitations were attributed to a "RVOT PVC".   Today she tells me that her recent heart monitor was worn during the time of great stress.  She lost a close family member and was attending the funeral.  She also had an episode of increased PVC burden after an alcoholic beverage.  She tells me that since some time has passed since this very stressful period, the burden of PVCs has greatly decreased.  She says she can still occasionally feel the PVCs but they have improved significantly.  No syncope or presyncope.  She does not take a scheduled beta-blocker.    Past Medical History:  Diagnosis Date   Allergy    Anxiety    Aortic regurgitation    moderate by echo 10/2020   Arthritis    Cancer ([place])    skin   Cataract    bilateral - MD is just watching    Diverticulosis    Fuchs' endothelial dystrophy    follows with optho regularly    Gestational diabetes    Heart murmur    MVP    History of depression    HSV-2 infection    Hyperlipidemia    no meds - diet controlled   Hyperplastic colon polyp    Mitral valve prolapse    mild to moderate MR by echo 10/2020   PVC's (premature ventricular contractions)    intol of BB, sxc palpitations r/t stress   RVOT ventricular tachycardia/PVCs    EP eval 01/2013 for freq PVCs    Past Surgical History:  Procedure Laterality Date   CESAREAN SECTION     x 3   COLONOSCOPY      KNEE SURGERY Right    MANDIBLE SURGERY     right side in front of ear   MOHS SURGERY  2021   POLYPECTOMY     WISDOM TOOTH EXTRACTION      Current Medications: Current Meds  Medication Sig   ALPRAZolam (XANAX) 0.25 MG tablet Take 1 tablet (0.25 mg total) by mouth daily as needed for anxiety.   Cholecalciferol (VITAMIN D3) 50 MCG (2000 UT) TABS Take 1,000 mg by mouth daily.    ciprofloxacin (CIPRO) 500 MG tablet Take 1 tablet (500 mg total) by mouth 2 (two) times daily.   MAGNESIUM GLUCONATE PO Take 200 mg by mouth 2 (two) times daily.    metoprolol succinate (TOPROL XL) 25 MG 24 hr tablet Take 1 tablet (25 mg total) by mouth daily.   [DISCONTINUED] propranolol (INDERAL) 10 MG tablet Take 1 tablet (10 mg total) by mouth as needed.     Allergies:   Atorvastatin, Crestor [rosuvastatin calcium], Latex, Lipitor [atorvastatin calcium], Other, and Pollen extract   Social History   Socioeconomic History   Marital status: Married    Spouse name: Not on file   Number of children: 3   Years of education: Not on file  Highest education level: Not on file  Occupational History   Not on file  Tobacco Use   Smoking status: Former    Types: Cigarettes    Quit date: 06/15/1983    Years since quitting: 38.0   Smokeless tobacco: Never  Vaping Use   Vaping Use: Never used  Substance and Sexual Activity   Alcohol use: Not Currently    Comment: occasional   Drug use: No   Sexual activity: Not Currently    Birth control/protection: Post-menopausal  Other Topics Concern   Not on file  Social History Narrative   Regular exercise: yes   Caffeine use: none   Social Determinants of Health   Financial Resource Strain: Not on file  Food Insecurity: Not on file  Transportation Needs: Not on file  Physical Activity: Not on file  Stress: Not on file  Social Connections: Not on file     Family History: The patient's family history includes Alcohol abuse in an other family member; Colon  cancer in her maternal grandmother and mother; Colon polyps in her sister and sister; Heart disease in her father. There is no history of Rectal cancer, Stomach cancer, or Esophageal cancer.  ROS:   Please see the history of present illness.    All other systems reviewed and are negative.  EKGs/Labs/Other Studies Reviewed:    The following studies were reviewed today:  04/23/2021 Zio 17% burden of PVCs  ECGs reviewed and show occasional PVCs with an inferior axis.  11/06/2020 Echo LV EF normal, 60% RV normal Mild-moderate MR Moderate AR    EKG:  The ekg ordered today demonstrates sinus rhythm with frequent monomorphic PVCs.  PVCs have a steeply inferior axis and a precordial transition in V3.  The sinus rhythm transition is in V2 and precedes the transition of the PVC.   Recent Labs: 02/25/2021: ALT 11; BUN 12; Creatinine, Ser 0.76; Hemoglobin 13.3; Platelets 224.0; Potassium 4.0; Sodium 138 04/27/2021: Magnesium 2.1; TSH 2.050  Recent Lipid Panel    Component Value Date/Time   CHOL 277 (H) 02/25/2021 1045   CHOL 278 (H) 12/25/2018 1204   TRIG 139.0 02/25/2021 1045   HDL 63.80 02/25/2021 1045   HDL 68 12/25/2018 1204   CHOLHDL 4 02/25/2021 1045   VLDL 27.8 02/25/2021 1045   LDLCALC 186 (H) 02/25/2021 1045   LDLCALC 183 (H) 12/25/2018 1204   LDLDIRECT 168.0 01/02/2018 1459    Physical Exam:    VS:  BP 118/72    Pulse 86    Ht 5' 3.5" (1.613 m)    Wt 170 lb 3.2 oz (77.2 kg)    SpO2 98%    BMI 29.68 kg/m     Wt Readings from Last 3 Encounters:  06/10/21 170 lb 3.2 oz (77.2 kg)  02/25/21 170 lb 12.8 oz (77.5 kg)  10/17/20 170 lb 9.6 oz (77.4 kg)     GEN:  Well nourished, well developed in no acute distress HEENT: Normal NECK: No JVD; No carotid bruits LYMPHATICS: No lymphadenopathy CARDIAC: Regular rhythm with occasional premature beats, no murmurs, rubs, gallops RESPIRATORY:  Clear to auscultation without rales, wheezing or rhonchi  ABDOMEN: Soft, non-tender,  non-distended MUSCULOSKELETAL:  No edema; No deformity  SKIN: Warm and dry NEUROLOGIC:  Alert and oriented x 3 PSYCHIATRIC:  Normal affect       ASSESSMENT:    1. RVOT ventricular tachycardia/PVCs   2. PVC (premature ventricular contraction)   3. Mitral valve prolapse   4. Palpitations  PLAN:    In order of problems listed above:  #Frequent monomorphic PVCs Morphology suggests an outflow tract origin.  Precordial transition is early although it follows the sinus rhythm transition.  I suspect it would be on the RV posterior septum versus anterior LVOT.  We discussed treatment strategies for the PVC.  Given that she is minimally symptomatic with normal LV function I do not think we need to proceed immediately to EP study and ablation.  I think starting with a scheduled long-acting beta-blocker is reasonable.  I recommended that she take metoprolol succinate 25 mg by mouth once daily.  She should discontinue propranolol.  I will have her follow-up with [place] in about 6 months.  Okay to see an APP.  At that appointment, I recommend rechecking a ZIO monitor to reassess the burden of PVCs on beta-blocker.  If inadequate control on long-acting beta-blocker, favor the addition of a class Ic agent.  I would want to check a coronary CT prior to starting.  Could consider propafenone or flecainide.  #History of mitral valve prolapse No murmur on exam today.  No signs of heart failure.  Follow-up 6 months with APP.  Medication Adjustments/Labs and Tests Ordered: Current medicines are reviewed at length with the patient today.  Concerns regarding medicines are outlined above.  Orders Placed This Encounter  Procedures   EKG 12-Lead   Meds ordered this encounter  Medications   metoprolol succinate (TOPROL XL) 25 MG 24 hr tablet    Sig: Take 1 tablet (25 mg total) by mouth daily.    Dispense:  90 tablet    Refill:  3     Signed, Lysbeth Galas T. Quentin Ore, MD, [facility], [facility] 06/10/2021 1:15 PM     Electrophysiology [facility]

## 2021-06-10 ENCOUNTER — Ambulatory Visit: Payer: PPO | Admitting: Cardiology

## 2021-06-10 ENCOUNTER — Encounter: Payer: Self-pay | Admitting: Cardiology

## 2021-06-10 ENCOUNTER — Other Ambulatory Visit: Payer: Self-pay

## 2021-06-10 VITALS — BP 118/72 | HR 86 | Ht 63.5 in | Wt 170.2 lb

## 2021-06-10 DIAGNOSIS — I4729 Other ventricular tachycardia: Secondary | ICD-10-CM | POA: Diagnosis not present

## 2021-06-10 DIAGNOSIS — R002 Palpitations: Secondary | ICD-10-CM | POA: Diagnosis not present

## 2021-06-10 DIAGNOSIS — I493 Ventricular premature depolarization: Secondary | ICD-10-CM

## 2021-06-10 DIAGNOSIS — I341 Nonrheumatic mitral (valve) prolapse: Secondary | ICD-10-CM | POA: Diagnosis not present

## 2021-06-10 MED ORDER — METOPROLOL SUCCINATE ER 25 MG PO TB24: 25.0000 mg | ORAL_TABLET | Freq: Every day | ORAL | 3 refills | Status: DC

## 2021-06-10 NOTE — Patient Instructions (Addendum)
Medication Instructions:  Your physician has recommended you make the following change in your medication:    STOP taking propranolol  2.    START taking metoprolol succinate 25 mg-  Take one tablet by mouth daily   Lab Work: None ordered. If you have labs (blood work) drawn today and your tests are completely normal, you will receive your results only by: [place] (if you have MyChart) OR A paper copy in the mail If you have any lab test that is abnormal or we need to change your treatment, we will call you to review the results.  Testing/Procedures: None ordered.  Follow-Up: At [facility], you and your health needs are our priority.  As part of our continuing mission to provide you with exceptional heart care, we have created designated Provider Care Teams.  These Care Teams include your primary Cardiologist (physician) and Advanced Practice Providers (APPs -  Physician Assistants and Nurse Practitioners) who all work together to provide you with the care you need, when you need it.  Your next appointment:   Your physician wants you to follow-up in: 6 months with one of the following Advanced Practice Providers on your designated Care Team:   Jean Smith, Jean Smith "Jean Smith" [place], Jean You will receive a reminder letter in the mail two months in advance. If you don't receive a letter, please call our office to schedule the follow-up appointment.  Metoprolol Extended-Release Tablets What is this medication? METOPROLOL (me TOE proe lole) treats high blood pressure and heart failure. It may also be used to prevent chest pain (angina). It works by lowering your blood pressure and heart rate, making it easier for your heart to pump blood to the rest of your body. It belongs to a group of medications called beta blockers. This medicine may be used for other purposes; ask your health care provider or pharmacist if you have questions. COMMON BRAND NAME(S): toprol, Toprol  XL What should I tell my care team before I take this medication? They need to know if you have any of these conditions: Diabetes Heart or vessel disease like slow heart rate, worsening heart failure, heart block, sick sinus syndrome, or Raynaud's disease Kidney disease Liver disease Lung or breathing disease, like asthma or emphysema Pheochromocytoma Thyroid disease An unusual or allergic reaction to metoprolol, other beta blockers, medications, foods, dyes, or preservatives Pregnant or trying to get pregnant Breast-feeding How should I use this medication? Take this medication by mouth. Take it as directed on the prescription label at the same time every day. Take it with food. You may cut the tablet in half if it is scored (has a line in the middle of it). This may help you swallow the tablet if the whole tablet is too big. Be sure to take both halves. Do not take just one-half of the tablet. Keep taking it unless your care team tells you to stop. Talk to your care team about the use of this medication in children. While it may be prescribed for children as young as 6 years for selected conditions, precautions do apply. Overdosage: If you think you have taken too much of this medicine contact a poison control center or emergency room at once. NOTE: This medicine is only for you. Do not share this medicine with others. What if I miss a dose? If you miss a dose, take it as soon as you can. If it is almost time for your next dose, take only that dose. Do  not take double or extra doses. What may interact with this medication? This medication may interact with the following: Certain medications for blood pressure, heart disease, irregular heartbeat Certain medications for depression, like monoamine oxidase (MAO) inhibitors, fluoxetine, or paroxetine Clonidine Dobutamine Epinephrine Isoproterenol Reserpine This list may not describe all possible interactions. Give your health care provider  a list of all the medicines, herbs, non-prescription drugs, or dietary supplements you use. Also tell them if you smoke, drink alcohol, or use illegal drugs. Some items may interact with your medicine. What should I watch for while using this medication? Visit your care team for regular checks on your progress. Check your blood pressure as directed. Ask your care team what your blood pressure should be. Also, find out when you should contact them. Do not treat yourself for coughs, colds, or pain while you are using this medication without asking your care team for advice. Some medications may increase your blood pressure. You may get drowsy or dizzy. Do not drive, use machinery, or do anything that needs mental alertness until you know how this medication affects you. Do not stand up or sit up quickly, especially if you are an older patient. This reduces the risk of dizzy or fainting spells. Alcohol may interfere with the effect of this medication. Avoid alcoholic drinks. This medication may increase blood sugar. Ask your care team if changes in diet or medications are needed if you have diabetes. What side effects may I notice from receiving this medication? Side effects that you should report to your care team as soon as possible: Allergic reactions--skin rash, itching, hives, swelling of the face, lips, tongue, or throat Heart failure--shortness of breath, swelling of the ankles, feet, or hands, sudden weight gain, unusual weakness or fatigue Low blood pressure--dizziness, feeling faint or lightheaded, blurry vision Raynaud's--cool, numb, or painful fingers or toes that may change color from pale, to blue, to red Slow heartbeat--dizziness, feeling faint or lightheaded, confusion, trouble breathing, unusual weakness or fatigue Worsening mood, feelings of depression Side effects that usually do not require medical attention (report to your care team if they continue or are bothersome): Change in sex  drive or performance Diarrhea Dizziness Fatigue Headache This list may not describe all possible side effects. Call your doctor for medical advice about side effects. You may report side effects to FDA at 1-800-FDA-1088. Where should I keep my medication? Keep out of the reach of children and pets. Store at room temperature between 20 and 25 degrees C (68 and 77 degrees F). Throw away any unused medication after the expiration date. NOTE: This sheet is a summary. It may not cover all possible information. If you have questions about this medicine, talk to your doctor, pharmacist, or health care provider.  2022 Elsevier/[employer] (2021-02-17 00:00:00)

## 2021-06-16 ENCOUNTER — Ambulatory Visit (INDEPENDENT_AMBULATORY_CARE_PROVIDER_SITE_OTHER): Payer: PPO

## 2021-06-16 ENCOUNTER — Other Ambulatory Visit: Payer: Self-pay

## 2021-06-16 VITALS — BP 118/70 | HR 89 | Temp 98.3°F | Resp 16 | Ht 64.0 in | Wt 171.8 lb

## 2021-06-16 DIAGNOSIS — Z0000 Encounter for general adult medical examination without abnormal findings: Secondary | ICD-10-CM

## 2021-06-16 DIAGNOSIS — Z23 Encounter for immunization: Secondary | ICD-10-CM

## 2021-06-16 NOTE — Patient Instructions (Signed)
Jean Smith , Thank you for taking time to come for your Medicare Wellness Visit. I appreciate your ongoing commitment to your health goals. Please review the following plan we discussed and let me know if I can assist you in the future.   Screening recommendations/referrals: Colonoscopy: 04/30/2019; due every 3 years Mammogram: 04/21/2021; due every 2 years Bone Density: 11/04/2020; due every 2 years Recommended yearly ophthalmology/optometry visit for glaucoma screening and checkup Recommended yearly dental visit for hygiene and checkup  Vaccinations: Influenza vaccine: 06/16/2021 Pneumococcal vaccine: 06/14/2012, 02/27/2018 Tdap vaccine: 08/27/2014; due every 10 years Shingles vaccine: never done; You may receive this vaccine at your local pharmacy.  Covid-19: 08/04/2019, 09/21/2019, 05/30/2020, 03/15/2021  Advanced directives: Please bring a copy of your health care power of attorney and living will to the office at your convenience.  Conditions/risks identified: Yes; Client understands the importance of follow-up with providers by attending scheduled visits and discussed goals to eat healthier, increase physical activity, exercise the brain, socialize more, get enough sleep and make time for laughter.  Next appointment: Please schedule your next Medicare Wellness Visit with your Nurse Health Advisor in 1 year by calling [phone].   Preventive Care 32 Years and Older, Female Preventive care refers to lifestyle choices and visits with your health care provider that can promote health and wellness. What does preventive care include? A yearly physical exam. This is also called an annual well check. Dental exams once or twice a year. Routine eye exams. Ask your health care provider how often you should have your eyes checked. Personal lifestyle choices, including: Daily care of your teeth and gums. Regular physical activity. Eating a healthy diet. Avoiding tobacco and drug use. Limiting  alcohol use. Practicing safe sex. Taking low-dose aspirin every day. Taking vitamin and mineral supplements as recommended by your health care provider. What happens during an annual well check? The services and screenings done by your health care provider during your annual well check will depend on your age, overall health, lifestyle risk factors, and family history of disease. Counseling  Your health care provider may ask you questions about your: Alcohol use. Tobacco use. Drug use. Emotional well-being. Home and relationship well-being. Sexual activity. Eating habits. History of falls. Memory and ability to understand (cognition). Work and work Statistician. Reproductive health. Screening  You may have the following tests or measurements: Height, weight, and BMI. Blood pressure. Lipid and cholesterol levels. These may be checked every 5 years, or more frequently if you are over 53 years old. Skin check. Lung cancer screening. You may have this screening every year starting at age 63 if you have a 30-pack-year history of smoking and currently smoke or have quit within the past 15 years. Fecal occult blood test (FOBT) of the stool. You may have this test every year starting at age 11. Flexible sigmoidoscopy or colonoscopy. You may have a sigmoidoscopy every 5 years or a colonoscopy every 10 years starting at age 61. Hepatitis C blood test. Hepatitis B blood test. Sexually transmitted disease (STD) testing. Diabetes screening. This is done by checking your blood sugar (glucose) after you have not eaten for a while (fasting). You may have this done every 1-3 years. Bone density scan. This is done to screen for osteoporosis. You may have this done starting at age 84. Mammogram. This may be done every 1-2 years. Talk to your health care provider about how often you should have regular mammograms. Talk with your health care provider about your test results,  treatment options, and if  necessary, the need for more tests. Vaccines  Your health care provider may recommend certain vaccines, such as: Influenza vaccine. This is recommended every year. Tetanus, diphtheria, and acellular pertussis (Tdap, Td) vaccine. You may need a Td booster every 10 years. Zoster vaccine. You may need this after age 27. Pneumococcal 13-valent conjugate (PCV13) vaccine. One dose is recommended after age 24. Pneumococcal polysaccharide (PPSV23) vaccine. One dose is recommended after age 20. Talk to your health care provider about which screenings and vaccines you need and how often you need them. This information is not intended to replace advice given to you by your health care provider. Make sure you discuss any questions you have with your health care provider. Document Released: 06/27/2015 Document Revised: 02/18/2016 Document Reviewed: 04/01/2015 Elsevier Interactive Patient Education  2017 [place] Prevention in the Home Falls can cause injuries. They can happen to people of all ages. There are many things you can do to make your home safe and to help prevent falls. What can I do on the outside of my home? Regularly fix the edges of walkways and driveways and fix any cracks. Remove anything that might make you trip as you walk through a door, such as a raised step or threshold. Trim any bushes or trees on the path to your home. Use bright outdoor lighting. Clear any walking paths of anything that might make someone trip, such as rocks or tools. Regularly check to see if handrails are loose or broken. Make sure that both sides of any steps have handrails. Any raised decks and porches should have guardrails on the edges. Have any leaves, snow, or ice cleared regularly. Use sand or salt on walking paths during winter. Clean up any spills in your garage right away. This includes oil or grease spills. What can I do in the bathroom? Use night lights. Install grab bars by the toilet  and in the tub and shower. Do not use towel bars as grab bars. Use non-skid mats or decals in the tub or shower. If you need to sit down in the shower, use a plastic, non-slip stool. Keep the floor dry. Clean up any water that spills on the floor as soon as it happens. Remove soap buildup in the tub or shower regularly. Attach bath mats securely with double-sided non-slip rug tape. Do not have throw rugs and other things on the floor that can make you trip. What can I do in the bedroom? Use night lights. Make sure that you have a light by your bed that is easy to reach. Do not use any sheets or blankets that are too big for your bed. They should not hang down onto the floor. Have a firm chair that has side arms. You can use this for support while you get dressed. Do not have throw rugs and other things on the floor that can make you trip. What can I do in the kitchen? Clean up any spills right away. Avoid walking on wet floors. Keep items that you use a lot in easy-to-reach places. If you need to reach something above you, use a strong step stool that has a grab bar. Keep electrical cords out of the way. Do not use floor polish or wax that makes floors slippery. If you must use wax, use non-skid floor wax. Do not have throw rugs and other things on the floor that can make you trip. What can I do with my stairs? Do  not leave any items on the stairs. Make sure that there are handrails on both sides of the stairs and use them. Fix handrails that are broken or loose. Make sure that handrails are as long as the stairways. Check any carpeting to make sure that it is firmly attached to the stairs. Fix any carpet that is loose or worn. Avoid having throw rugs at the top or bottom of the stairs. If you do have throw rugs, attach them to the floor with carpet tape. Make sure that you have a light switch at the top of the stairs and the bottom of the stairs. If you do not have them, ask someone to add  them for you. What else can I do to help prevent falls? Wear shoes that: Do not have high heels. Have rubber bottoms. Are comfortable and fit you well. Are closed at the toe. Do not wear sandals. If you use a stepladder: Make sure that it is fully opened. Do not climb a closed stepladder. Make sure that both sides of the stepladder are locked into place. Ask someone to hold it for you, if possible. Clearly mark and make sure that you can see: Any grab bars or handrails. First and last steps. Where the edge of each step is. Use tools that help you move around (mobility aids) if they are needed. These include: Canes. Walkers. Scooters. Crutches. Turn on the lights when you go into a dark area. Replace any light bulbs as soon as they burn out. Set up your furniture so you have a clear path. Avoid moving your furniture around. If any of your floors are uneven, fix them. If there are any pets around you, be aware of where they are. Review your medicines with your doctor. Some medicines can make you feel dizzy. This can increase your chance of falling. Ask your doctor what other things that you can do to help prevent falls. This information is not intended to replace advice given to you by your health care provider. Make sure you discuss any questions you have with your health care provider. Document Released: 03/27/2009 Document Revised: 11/06/2015 Document Reviewed: 07/05/2014 Elsevier Interactive Patient Education  2017 [employer].

## 2021-06-16 NOTE — Progress Notes (Signed)
Subjective:   Jean Smith is a 71 y.o. female who presents for Medicare Annual (Subsequent) preventive examination.  Review of Systems     Cardiac Risk Factors include: advanced age (>28men, >17 women);dyslipidemia;family history of premature cardiovascular disease     Objective:    Today's Vitals   06/16/21 1309  BP: 118/70  Pulse: 89  Resp: 16  Temp: 98.3 F (36.8 C)  SpO2: 97%  Weight: 171 lb 12.8 oz (77.9 kg)  Height: 5\' 4"  (1.626 m)  PainSc: 0-No pain   Body mass index is 29.49 kg/m.  Advanced Directives 06/16/2021 09/10/2020 07/05/2019 05/19/2018  Does Patient Have a Medical Advance Directive? Yes Yes No Yes  Type of Advance Directive [place];Living will  Does patient want to make changes to medical advance directive? No - Patient declined - - No - Patient declined  Copy of [place] in Chart? No - copy requested - - No - copy requested    Current Medications (verified) Outpatient Encounter Medications as of 06/16/2021  Medication Sig   ALPRAZolam (XANAX) 0.25 MG tablet Take 1 tablet (0.25 mg total) by mouth daily as needed for anxiety.   Cholecalciferol (VITAMIN D3) 50 MCG (2000 UT) TABS Take 1,000 mg by mouth daily.    MAGNESIUM GLUCONATE PO Take 200 mg by mouth 2 (two) times daily.    metoprolol succinate (TOPROL XL) 25 MG 24 hr tablet Take 1 tablet (25 mg total) by mouth daily.   ciprofloxacin (CIPRO) 500 MG tablet Take 1 tablet (500 mg total) by mouth 2 (two) times daily. (Patient not taking: Reported on 06/16/2021)   loratadine (CLARITIN) 10 MG tablet Take 10 mg by mouth daily as needed for allergies. (Patient not taking: Reported on 06/10/2021)   metroNIDAZOLE (FLAGYL) 500 MG tablet Take 1 tablet (500 mg total) by mouth 2 (two) times daily. (Patient not taking: Reported on 06/10/2021)   No facility-administered encounter medications on file as of 06/16/2021.    Allergies  (verified) Atorvastatin, Crestor [rosuvastatin calcium], Latex, Lipitor [atorvastatin calcium], Other, and Pollen extract   History: Past Medical History:  Diagnosis Date   Allergy    Anxiety    Aortic regurgitation    moderate by echo 10/2020   Arthritis    Cancer ([place])    skin   Cataract    bilateral - MD is just watching    Diverticulosis    Fuchs' endothelial dystrophy    follows with optho regularly    Gestational diabetes    Heart murmur    MVP    History of depression    HSV-2 infection    Hyperlipidemia    no meds - diet controlled   Hyperplastic colon polyp    Mitral valve prolapse    mild to moderate MR by echo 10/2020   PVC's (premature ventricular contractions)    intol of BB, sxc palpitations r/t stress   RVOT ventricular tachycardia/PVCs    EP eval 01/2013 for freq PVCs   Past Surgical History:  Procedure Laterality Date   CESAREAN SECTION     x 3   COLONOSCOPY     KNEE SURGERY Right    MANDIBLE SURGERY     right side in front of ear   MOHS SURGERY  2021   POLYPECTOMY     WISDOM TOOTH EXTRACTION     Family History  Problem Relation Age of Onset   Colon cancer Mother  dx'd in her 48's-- stage 2    Heart disease Father    Colon cancer Maternal Grandmother    Colon polyps Sister    Colon polyps Sister    Alcohol abuse Other    Rectal cancer Neg Hx    Stomach cancer Neg Hx    Esophageal cancer Neg Hx    Social History   Socioeconomic History   Marital status: Married    Spouse name: Not on file   Number of children: 3   Years of education: Not on file   Highest education level: Not on file  Occupational History   Not on file  Tobacco Use   Smoking status: Former    Types: Cigarettes    Quit date: 06/15/1983    Years since quitting: 38.0   Smokeless tobacco: Never  Vaping Use   Vaping Use: Never used  Substance and Sexual Activity   Alcohol use: Not Currently    Comment: occasional   Drug use: No   Sexual activity: Not  Currently    Birth control/protection: Post-menopausal  Other Topics Concern   Not on file  Social History Narrative   Regular exercise: yes   Caffeine use: none   Social Determinants of Health   Financial Resource Strain: Low Risk    Difficulty of Paying Living Expenses: Not hard at all  Food Insecurity: No Food Insecurity   Worried About Charity fundraiser in the Last Year: Never true   Ran Out of Food in the Last Year: Never true  Transportation Needs: No Transportation Needs   Lack of Transportation (Medical): No   Lack of Transportation (Non-Medical): No  Physical Activity: Sufficiently Active   Days of Exercise per Week: 5 days   Minutes of Exercise per Session: 30 min  Stress: Stress Concern Present   Feeling of Stress : To some extent  Social Connections: Engineer, building services of Communication with Friends and Family: More than three times a week   Frequency of Social Gatherings with Friends and Family: More than three times a week   Attends Religious Services: More than 4 times per year   Active Member of [employer] or Organizations: Yes   Attends Music therapist: More than 4 times per year   Marital Status: Married    Tobacco Counseling Counseling given: Not Answered   Clinical Intake:  Pre-visit preparation completed: Yes  Pain : No/denies pain Pain Score: 0-No pain     BMI - recorded: 29.49 Nutritional Status: BMI 25 -29 Overweight Nutritional Risks: None Diabetes: No  How often do you need to have someone help you when you read instructions, pamphlets, or other written materials from your doctor or pharmacy?: 1 - Never What is the last grade level you completed in school?: 2 years of college; no degree  Diabetic?no  Interpreter Needed?: No  Information entered by :: Lisette Abu, LPN   Activities of Daily Living In your present state of health, do you have any difficulty performing the following activities: 06/16/2021  08/25/2020  Hearing? N N  Vision? N N  Difficulty concentrating or making decisions? N N  Walking or climbing stairs? N N  Dressing or bathing? N N  Doing errands, shopping? N N  Preparing Food and eating ? N -  Using the Toilet? N -  In the past six months, have you accidently leaked urine? N -  Do you have problems with loss of bowel control? N -  Managing your  Medications? N -  Managing your Finances? N -  Housekeeping or managing your Housekeeping? N -  Some recent data might be hidden    Patient Care Team: Hoyt Koch, MD as PCP - General (Internal Medicine) Sueanne Margarita, MD as PCP - Cardiology (Cardiology) Vickie Epley, MD as PCP - Electrophysiology (Cardiology) Deboraha Sprang, MD (Cardiology) Nahser, Wonda Cheng, MD (Cardiology) Lafayette Dragon, MD (Inactive) (Gastroenterology) Aloha Gell, MD (Obstetrics and Gynecology) Monna Fam, MD (Ophthalmology)  Indicate any recent Medical Services you may have received from other than [facility] providers in the past year (date may be approximate).     Assessment:   This is a routine wellness examination for [place].  Hearing/Vision screen Hearing Screening - Comments:: Patient denied any hearing difficulty.   No hearing aids.  Vision Screening - Comments:: Patient wears corrective glasses/contacts.  Eye exam done annually by: [facility]  Dietary issues and exercise activities discussed: Current Exercise Habits: Home exercise routine, Time (Minutes): 30, Frequency (Times/Week): 5, Weekly Exercise (Minutes/Week): 150, Intensity: Moderate, Exercise limited by: None identified   Goals Addressed   None   Depression Screen PHQ 2/9 Scores 06/16/2021 10/17/2020 05/20/2020 02/25/2017  PHQ - 2 Score 0 0 4 1  PHQ- 9 Score - - 12 -    Fall Risk Fall Risk  06/16/2021 05/20/2020 02/27/2018  Falls in the past year? 0 0 No  Number falls in past yr: 0 0 -  Injury with Fall? 0 0 -  Risk for fall due to : No Fall Risks -  -    FALL RISK PREVENTION PERTAINING TO THE HOME:  Any stairs in or around the home? Yes  If so, are there any without handrails? No  Home free of loose throw rugs in walkways, pet beds, electrical cords, etc? Yes  Adequate lighting in your home to reduce risk of falls? Yes   ASSISTIVE DEVICES UTILIZED TO PREVENT FALLS:  Life alert? No  Use of a cane, walker or w/c? No  Grab bars in the bathroom? No  Shower chair or bench in shower? No  Elevated toilet seat or a handicapped toilet? No   TIMED UP AND GO:  Was the test performed? Yes .  Length of time to ambulate 10 feet: 6 sec.   Gait steady and fast without use of assistive device  Cognitive Function: Normal cognitive status assessed by direct observation by this Nurse Health Advisor. No abnormalities found.          Immunizations Immunization History  Administered Date(s) Administered   Fluad Quad(high Dose 65+) 04/05/2019, 05/20/2020   Influenza, High Dose Seasonal PF 02/27/2018   Influenza,inj,Quad PF,6+ Mos 06/29/2013, 02/25/2017   PFIZER(Purple Top)SARS-COV-2 Vaccination 08/04/2019, 09/21/2019, 05/30/2020   Pfizer Covid-19 Vaccine Bivalent Booster 2yrs & up 03/15/2021   Pneumococcal Conjugate-13 02/27/2018   Pneumococcal Polysaccharide-23 06/14/2012   Tdap 08/27/2014    TDAP status: Up to date  Flu Vaccine status: Completed at today's visit  Pneumococcal vaccine status: Due, Education has been provided regarding the importance of this vaccine. Advised may receive this vaccine at local pharmacy or Health Dept. Aware to provide a copy of the vaccination record if obtained from local pharmacy or Health Dept. Verbalized acceptance and understanding.  Covid-19 vaccine status: Completed vaccines  Qualifies for Shingles Vaccine? Yes   Zostavax completed No   Shingrix Completed?: No.    Education has been provided regarding the importance of this vaccine. Patient has been advised to call insurance  company to  determine out of pocket expense if they have not yet received this vaccine. Advised may also receive vaccine at local pharmacy or Health Dept. Verbalized acceptance and understanding.  Screening Tests Health Maintenance  Topic Date Due   Zoster Vaccines- Shingrix (1 of 2) Never done   Pneumonia Vaccine 44+ Years old (49) 02/28/2019   INFLUENZA VACCINE  01/12/2021   COLONOSCOPY (Pts 45-48yrs Insurance coverage will need to be confirmed)  04/29/2022   MAMMOGRAM  04/22/2023   TETANUS/TDAP  08/26/2024   DEXA SCAN  Completed   COVID-19 Vaccine  Completed   Hepatitis C Screening  Completed   HPV VACCINES  Aged Out    Health Maintenance  Health Maintenance Due  Topic Date Due   Zoster Vaccines- Shingrix (1 of 2) Never done   Pneumonia Vaccine 43+ Years old (91) 02/28/2019   INFLUENZA VACCINE  01/12/2021    Colorectal cancer screening: Type of screening: Colonoscopy. Completed 04/30/2019. Repeat every 3 years  Mammogram status: Completed 04/21/2021. Repeat every year  Bone Density status: Completed 11/04/2020. Results reflect: Bone density results: OSTEOPENIA. Repeat every 2 years.  Lung Cancer Screening: (Low Dose CT Chest recommended if Age 88-80 years, 30 pack-year currently smoking OR have quit w/in 15years.) does not qualify.   Lung Cancer Screening Referral: no  Additional Screening:  Hepatitis C Screening: does qualify; Completed yes  Vision Screening: Recommended annual ophthalmology exams for early detection of glaucoma and other disorders of the eye. Is the patient up to date with their annual eye exam?  Yes  Who is the provider or what is the name of the office in which the patient attends annual eye exams? [facility] If pt is not established with a provider, would they like to be referred to a provider to establish care? No .   Dental Screening: Recommended annual dental exams for proper oral hygiene  Community Resource Referral / Chronic Care Management: CRR  required this visit?  No   CCM required this visit?  No      Plan:     I have personally reviewed and noted the following in the patients chart:   Medical and social history Use of alcohol, tobacco or illicit drugs  Current medications and supplements including opioid prescriptions.  Functional ability and status Nutritional status Physical activity Advanced directives List of other physicians Hospitalizations, surgeries, and ER visits in previous 12 months Vitals Screenings to include cognitive, depression, and falls Referrals and appointments  In addition, I have reviewed and discussed with patient certain preventive protocols, quality metrics, and best practice recommendations. A written personalized care plan for preventive services as well as general preventive health recommendations were provided to patient.     Sheral Flow, LPN   [id number]   Nurse Notes:  Hearing Screening - Comments:: Patient denied any hearing difficulty.   No hearing aids.  Vision Screening - Comments:: Patient wears corrective glasses/contacts.  Eye exam done annually by: [facility]

## 2021-11-05 ENCOUNTER — Encounter: Payer: Self-pay | Admitting: Internal Medicine

## 2021-11-05 ENCOUNTER — Ambulatory Visit (INDEPENDENT_AMBULATORY_CARE_PROVIDER_SITE_OTHER): Payer: PPO | Admitting: Internal Medicine

## 2021-11-05 VITALS — BP 120/80 | HR 70 | Resp 18 | Ht 64.0 in | Wt 167.4 lb

## 2021-11-05 DIAGNOSIS — F32A Depression, unspecified: Secondary | ICD-10-CM

## 2021-11-05 DIAGNOSIS — E782 Mixed hyperlipidemia: Secondary | ICD-10-CM

## 2021-11-05 DIAGNOSIS — Z0000 Encounter for general adult medical examination without abnormal findings: Secondary | ICD-10-CM | POA: Diagnosis not present

## 2021-11-05 DIAGNOSIS — R7303 Prediabetes: Secondary | ICD-10-CM | POA: Diagnosis not present

## 2021-11-05 DIAGNOSIS — R5383 Other fatigue: Secondary | ICD-10-CM | POA: Diagnosis not present

## 2021-11-05 LAB — LIPID PANEL
Cholesterol: 313 mg/dL — ABNORMAL HIGH (ref 0–200)
HDL: 67.9 mg/dL (ref >39.00)
LDL Cholesterol: 219 mg/dL — ABNORMAL HIGH (ref 0–99)
NonHDL: 245.25
Total CHOL/HDL Ratio: 5
Triglycerides: 130 mg/dL (ref 0.0–149.0)
VLDL: 26 mg/dL (ref 0.0–40.0)

## 2021-11-05 LAB — COMPREHENSIVE METABOLIC PANEL
ALT: 15 U/L (ref 0–35)
AST: 18 U/L (ref 0–37)
Albumin: 4.4 g/dL (ref 3.5–5.2)
Alkaline Phosphatase: 50 U/L (ref 39–117)
BUN: 17 mg/dL (ref 6–23)
CO2: 27 mEq/L (ref 19–32)
Calcium: 9.7 mg/dL (ref 8.4–10.5)
Chloride: 101 mEq/L (ref 96–112)
Creatinine, Ser: 0.71 mg/dL (ref 0.40–1.20)
GFR: 86.99 mL/min (ref >60.00)
Glucose, Bld: 100 mg/dL — ABNORMAL HIGH (ref 70–99)
Potassium: 4.3 mEq/L (ref 3.5–5.1)
Sodium: 138 mEq/L (ref 135–145)
Total Bilirubin: 0.5 mg/dL (ref 0.2–1.2)
Total Protein: 7.4 g/dL (ref 6.0–8.3)

## 2021-11-05 LAB — HEMOGLOBIN A1C: Hgb A1c MFr Bld: 6.4 % (ref 4.6–6.5)

## 2021-11-05 LAB — TSH: TSH: 1.65 u[IU]/mL (ref 0.35–5.50)

## 2021-11-05 NOTE — Assessment & Plan Note (Signed)
Checking Hga1c today. 

## 2021-11-05 NOTE — Assessment & Plan Note (Signed)
Checking TSH today.

## 2021-11-05 NOTE — Assessment & Plan Note (Signed)
Checking lipid panel and CMP. Previously has declined statins due to hesitancy.

## 2021-11-05 NOTE — Assessment & Plan Note (Signed)
Flu shot yearly. Covid-19 counseled. Pneumonia declines. Shingrix declines. Tetanus due 2026. Colonoscopy due 2023. Mammogram due 2024, pap smear aged out and dexa completed. Counseled about sun safety and mole surveillance. Counseled about the dangers of distracted driving. Given 10 year screening recommendations.

## 2021-11-05 NOTE — Progress Notes (Signed)
   Subjective:   Patient ID: Jean Smith, female    DOB: 1950/07/27, 71 y.o.   MRN: [id number]  HPI The patient is here for physical.  PMH, [facility], social history reviewed and updated  Review of Systems  Constitutional: Negative.   HENT: Negative.    Eyes: Negative.   Respiratory:  Negative for cough, chest tightness and shortness of breath.   Cardiovascular:  Negative for chest pain, palpitations and leg swelling.  Gastrointestinal:  Negative for abdominal distention, abdominal pain, constipation, diarrhea, nausea and vomiting.  Musculoskeletal: Negative.   Skin: Negative.   Neurological: Negative.   Psychiatric/Behavioral:  Positive for dysphoric mood.    Objective:  Physical Exam Constitutional:      Appearance: She is well-developed.  HENT:     Head: Normocephalic and atraumatic.  Cardiovascular:     Rate and Rhythm: Normal rate and regular rhythm.  Pulmonary:     Effort: Pulmonary effort is normal. No respiratory distress.     Breath sounds: Normal breath sounds. No wheezing or rales.  Abdominal:     General: Bowel sounds are normal. There is no distension.     Palpations: Abdomen is soft.     Tenderness: There is no abdominal tenderness. There is no rebound.  Musculoskeletal:     Cervical back: Normal range of motion.  Skin:    General: Skin is warm and dry.  Neurological:     Mental Status: She is alert and oriented to person, place, and time.     Coordination: Coordination normal.    Vitals:   11/05/21 1000  BP: 120/80  Pulse: 70  Resp: 18  SpO2: 97%  Weight: 167 lb 6.4 oz (75.9 kg)  Height: '5\' 4"'$  (1.626 m)    This visit occurred during the SARS-CoV-2 public health emergency.  Safety protocols were in place, including screening questions prior to the visit, additional usage of staff PPE, and extensive cleaning of exam room while observing appropriate contact time as indicated for disinfecting solutions.   Assessment & Plan:

## 2021-11-05 NOTE — Assessment & Plan Note (Signed)
Uses rare xanax last filled 2021. More stress in her life currently does not want additional meds.

## 2021-11-05 NOTE — Patient Instructions (Signed)
We will check the labs today. 

## 2021-12-30 ENCOUNTER — Telehealth: Payer: Self-pay | Admitting: Internal Medicine

## 2021-12-30 NOTE — Telephone Encounter (Signed)
Pt called in and is requesting order to have APO B Test done to test for heart disease.   States she had it done over a decade ago, and her younger sister was tested for it and results were not good.   Wanting to know her values.   Please call if/ when ordered.

## 2021-12-31 NOTE — Telephone Encounter (Signed)
I am not sure I see a utility as her cholesterol levels recently were very high and she was not wanting to start cholesterol medicine to help. I am happy to explain more at a visit about this test and implications.

## 2021-12-31 NOTE — Telephone Encounter (Signed)
Called pt. Jean Smith at [phone] asking her to give our office a call back to discuss. Office number was provided.    If pt calls back please schedule her a visit with Dr. Sharlet Salina to discuss additional testing

## 2022-01-05 ENCOUNTER — Encounter: Payer: Self-pay | Admitting: Internal Medicine

## 2022-01-05 ENCOUNTER — Ambulatory Visit (INDEPENDENT_AMBULATORY_CARE_PROVIDER_SITE_OTHER): Payer: PPO | Admitting: Internal Medicine

## 2022-01-05 VITALS — BP 126/74 | HR 109 | Resp 18 | Ht 64.0 in | Wt 167.8 lb

## 2022-01-05 DIAGNOSIS — E782 Mixed hyperlipidemia: Secondary | ICD-10-CM | POA: Diagnosis not present

## 2022-01-05 NOTE — Progress Notes (Signed)
   Subjective:   Patient ID: Jean Smith, female    DOB: August 12, 1950, 71 y.o.   MRN: [id number]  HPI The patient is a 71 YO female coming in for concerns about heart disease.   Review of Systems  Constitutional: Negative.   HENT: Negative.    Eyes: Negative.   Respiratory:  Negative for cough, chest tightness and shortness of breath.   Cardiovascular:  Negative for chest pain, palpitations and leg swelling.  Gastrointestinal:  Negative for abdominal distention, abdominal pain, constipation, diarrhea, nausea and vomiting.  Musculoskeletal: Negative.   Skin: Negative.   Neurological: Negative.   Psychiatric/Behavioral: Negative.      Objective:  Physical Exam Constitutional:      Appearance: She is well-developed.  HENT:     Head: Normocephalic and atraumatic.  Cardiovascular:     Rate and Rhythm: Normal rate and regular rhythm.  Pulmonary:     Effort: Pulmonary effort is normal. No respiratory distress.     Breath sounds: Normal breath sounds. No wheezing or rales.  Abdominal:     General: Bowel sounds are normal. There is no distension.     Palpations: Abdomen is soft.     Tenderness: There is no abdominal tenderness. There is no rebound.  Musculoskeletal:     Cervical back: Normal range of motion.  Skin:    General: Skin is warm and dry.  Neurological:     Mental Status: She is alert and oriented to person, place, and time.     Coordination: Coordination normal.  Psychiatric:     Comments: Wandering historian     Vitals:   01/05/22 1430  BP: 126/74  Pulse: (!) 109  Resp: 18  SpO2: 96%  Weight: 167 lb 12.8 oz (76.1 kg)  Height: '5\' 4"'$  (1.626 m)    Assessment & Plan:  Visit time 20 minutes in face to face communication with patient and coordination of care, additional 10 minutes spent in record review, coordination or care, ordering tests, communicating/referring to other healthcare professionals, documenting in medical records all on the same day of the  visit for total time 30 minutes spent on the visit.

## 2022-01-05 NOTE — Patient Instructions (Signed)
We will check the cholesterol today.    

## 2022-01-06 LAB — APOLIPOPROTEIN B: Apolipoprotein B: 145 mg/dL — ABNORMAL HIGH (ref <90)

## 2022-01-08 ENCOUNTER — Encounter: Payer: Self-pay | Admitting: Internal Medicine

## 2022-01-08 NOTE — Assessment & Plan Note (Signed)
Checking apolipoprotein b at patient's request. We discussed that her cholesterol panel is very high and cholesterol lowering medication is already advised due to her family history and personal risk of CV disease. She has seen cardiology and they ordered a calcium score test which she did not complete. I advised that that is a better way to assess her plaque burden. She will consider doing that or starting statin based on this lab.

## 2022-04-08 ENCOUNTER — Ambulatory Visit (INDEPENDENT_AMBULATORY_CARE_PROVIDER_SITE_OTHER): Payer: PPO

## 2022-04-08 DIAGNOSIS — Z23 Encounter for immunization: Secondary | ICD-10-CM | POA: Diagnosis not present

## 2022-04-15 ENCOUNTER — Ambulatory Visit: Payer: PPO | Admitting: Family Medicine

## 2022-04-15 ENCOUNTER — Encounter: Payer: Self-pay | Admitting: Internal Medicine

## 2022-04-15 NOTE — Progress Notes (Signed)
Subjective:    Patient ID: Jean Smith, female    DOB: 11/05/1950, 71 y.o.   MRN: [id number]      HPI Vera is here for  Chief Complaint  Patient presents with   Eye irritation    Right lower eyelid with white dot - one month ago her right lower eye lid started bothering her - there was a red line in the lower eye lid.  She did warm compresses and used otc lubricating eye drops. She still had a small lump in the bottom eyelid.  She then noticed some white dots in the bottom of the eyelashes and tried to use a Q-tip to scrape them off and there was a little bit of blood which is now scabbing.  Overall her eye looks much better, but she still has a red mark going down into the eye and the lower eyelid.   Left lower eye lid -  bump and starting to feel the same     Using [facility] eye drops.  They were one of the ones that was recently recalled for Possibly causing eye infections.     Medications and allergies reviewed with patient and updated if appropriate.  Current Outpatient Medications on File Prior to Visit  Medication Sig Dispense Refill   ALPRAZolam (XANAX) 0.25 MG tablet Take 1 tablet (0.25 mg total) by mouth daily as needed for anxiety. 90 tablet 1   Cholecalciferol (VITAMIN D3) 50 MCG (2000 UT) TABS Take 1,000 mg by mouth daily.      loratadine (CLARITIN) 10 MG tablet Take 10 mg by mouth daily as needed for allergies.     MAGNESIUM GLUCONATE PO Take 200 mg by mouth 2 (two) times daily.      No current facility-administered medications on file prior to visit.    Review of Systems  Eyes:  Negative for pain, redness and visual disturbance.       Objective:   Vitals:   04/16/22 1551  BP: 120/80  Pulse: 85  Temp: 97.8 F (36.6 C)  SpO2: 99%   BP Readings from Last 3 Encounters:  04/16/22 120/80  01/05/22 126/74  11/05/21 120/80   Wt Readings from Last 3 Encounters:  04/16/22 168 lb (76.2 kg)  01/05/22 167 lb 12.8 oz (76.1 kg)  11/05/21 167 lb 6.4 oz  (75.9 kg)   Body mass index is 28.84 kg/m.    Physical Exam Constitutional:      General: She is not in acute distress.    Appearance: Normal appearance. She is not ill-appearing.  HENT:     Head: Normocephalic and atraumatic.  Eyes:     General: No scleral icterus.       Right eye: No discharge.        Left eye: No discharge.     Extraocular Movements: Extraocular movements intact.     Conjunctiva/sclera: Conjunctivae normal.     Comments: Right lower eye lid with irritation, red streak on mid-lateral lower eye lid extending downward.   Left lower eyelid with minimal irritation, ? slight swelling  Skin:    General: Skin is warm and dry.     Findings: No lesion or rash.  Neurological:     Mental Status: She is alert.            Assessment & Plan:   Blepharitis: New B/l lower eye lids Warm compresses Erythromycin ointment at HS Was using recalled [facility] brand lubricating eye drops and has stopped - no  evidence of conjunctivitis or eye involvement

## 2022-04-16 ENCOUNTER — Ambulatory Visit (INDEPENDENT_AMBULATORY_CARE_PROVIDER_SITE_OTHER): Payer: PPO | Admitting: Internal Medicine

## 2022-04-16 VITALS — BP 120/80 | HR 85 | Temp 97.8°F | Ht 64.0 in | Wt 168.0 lb

## 2022-04-16 DIAGNOSIS — H01002 Unspecified blepharitis right lower eyelid: Secondary | ICD-10-CM | POA: Diagnosis not present

## 2022-04-16 DIAGNOSIS — H01005 Unspecified blepharitis left lower eyelid: Secondary | ICD-10-CM | POA: Diagnosis not present

## 2022-04-16 MED ORDER — ERYTHROMYCIN 5 MG/GM OP OINT: 1.0000 | TOPICAL_OINTMENT | Freq: Every day | OPHTHALMIC | 0 refills | Status: DC

## 2022-04-16 NOTE — Patient Instructions (Addendum)
     Medications changes include :   erythromycin eye ointment   Do warm compresses    Return if symptoms worsen or fail to improve.

## 2022-04-20 ENCOUNTER — Ambulatory Visit: Payer: PPO | Admitting: Internal Medicine

## 2022-04-20 DIAGNOSIS — H0288A Meibomian gland dysfunction right eye, upper and lower eyelids: Secondary | ICD-10-CM | POA: Diagnosis not present

## 2022-04-20 DIAGNOSIS — H0288B Meibomian gland dysfunction left eye, upper and lower eyelids: Secondary | ICD-10-CM | POA: Diagnosis not present

## 2022-04-23 ENCOUNTER — Encounter: Payer: Self-pay | Admitting: Gastroenterology

## 2022-04-29 ENCOUNTER — Ambulatory Visit: Payer: PPO | Admitting: Family Medicine

## 2022-05-04 DIAGNOSIS — H0288B Meibomian gland dysfunction left eye, upper and lower eyelids: Secondary | ICD-10-CM | POA: Diagnosis not present

## 2022-05-04 DIAGNOSIS — H0288A Meibomian gland dysfunction right eye, upper and lower eyelids: Secondary | ICD-10-CM | POA: Diagnosis not present

## 2022-05-04 LAB — HM DIABETES EYE EXAM

## 2022-05-10 NOTE — Progress Notes (Signed)
[facility]

## 2022-05-26 NOTE — Progress Notes (Signed)
[place] Phone: [phone] Subjective:   Jean Smith, am serving as a scribe for Dr. Hulan Saas.  I'm seeing this patient by the request  of:  Hoyt Koch, MD  CC: right knee pain   IOX:[id number]  Jean Smith is a 71 y.o. female coming in with complaint of R knee pain. Last seen in 2015. Patient states that she continues to have R knee pain for years. Began Pilates this summer and she developed pain over medial aspect. Swelling noted over medial aspect. Unable to perform standing quad stretch due to tightness in the joint. Patient also notices pain with twisting when her foot is planted.   Patient also c/o L shoulder pain. Has done PT in the past. Pain wakes her up at night. Pain over anterior aspect of shoulder into bicep.    X-rays in 15 did have some slight narrowing of the medial compartment    Past Medical History:  Diagnosis Date   Allergy    Anxiety    Aortic regurgitation    moderate by echo 10/2020   Arthritis    Cancer ([place])    skin   Cataract    bilateral - MD is just watching    Diverticulosis    Fuchs' endothelial dystrophy    follows with optho regularly    Gestational diabetes    Heart murmur    MVP    History of depression    HSV-2 infection    Hyperlipidemia    Smith meds - diet controlled   Hyperplastic colon polyp    Mitral valve prolapse    mild to moderate MR by echo 10/2020   PVC's (premature ventricular contractions)    intol of BB, sxc palpitations r/t stress   RVOT ventricular tachycardia/PVCs    EP eval 01/2013 for freq PVCs   Past Surgical History:  Procedure Laterality Date   CESAREAN SECTION     x 3   COLONOSCOPY     KNEE SURGERY Right    MANDIBLE SURGERY     right side in front of ear   MOHS SURGERY  2021   POLYPECTOMY     WISDOM TOOTH EXTRACTION     Social History   Socioeconomic History   Marital status: Married    Spouse name: Not  on file   Number of children: 3   Years of education: Not on file   Highest education level: Not on file  Occupational History   Not on file  Tobacco Use   Smoking status: Former    Types: Cigarettes    Quit date: 06/15/1983    Years since quitting: 38.9   Smokeless tobacco: Never  Vaping Use   Vaping Use: Never used  Substance and Sexual Activity   Alcohol use: Not Currently    Comment: occasional   Drug use: Smith   Sexual activity: Not Currently    Birth control/protection: Post-menopausal  Other Topics Concern   Not on file  Social History Narrative   Regular exercise: yes   Caffeine use: none   Social Determinants of Health   Financial Resource Strain: Low Risk  (06/16/2021)   Overall Financial Resource Strain (CARDIA)    Difficulty of Paying Living Expenses: Not hard at all  Food Insecurity: Smith Food Insecurity (06/16/2021)   Hunger Vital Sign    Worried About Running Out of Food in the Last Year: Never true    Ran  Out of Food in the Last Year: Never true  Transportation Needs: Smith Transportation Needs (06/16/2021)   PRAPARE - Hydrologist (Medical): Smith    Lack of Transportation (Non-Medical): Smith  Physical Activity: Sufficiently Active (06/16/2021)   Exercise Vital Sign    Days of Exercise per Week: 5 days    Minutes of Exercise per Session: 30 min  Stress: Stress Concern Present (06/16/2021)   [place]    Feeling of Stress : To some extent  Social Connections: Socially Integrated (06/16/2021)   Social Connection and Isolation Panel [NHANES]    Frequency of Communication with Friends and Family: More than three times a week    Frequency of Social Gatherings with Friends and Family: More than three times a week    Attends Religious Services: More than 4 times per year    Active Member of [employer] or Organizations: Yes    Attends Music therapist: More than 4 times per year     Marital Status: Married   Allergies  Allergen Reactions   Atorvastatin Other (See Comments)    Sensitive    Crestor [Rosuvastatin Calcium]     Muscle ache   Latex Swelling   Lipitor [Atorvastatin Calcium]     Muscle ache   Other     Grass, local trees, cats and dogs   Pollen Extract    Family History  Problem Relation Age of Onset   Colon cancer Mother        dx'd in her 34's-- stage 2    Heart disease Father    Colon cancer Maternal Grandmother    Colon polyps Sister    Colon polyps Sister    Alcohol abuse Other    Rectal cancer Neg Hx    Stomach cancer Neg Hx    Esophageal cancer Neg Hx       Current Outpatient Medications (Respiratory):    loratadine (CLARITIN) 10 MG tablet, Take 10 mg by mouth daily as needed for allergies.    Current Outpatient Medications (Other):    ALPRAZolam (XANAX) 0.25 MG tablet, Take 1 tablet (0.25 mg total) by mouth daily as needed for anxiety.   Cholecalciferol (VITAMIN D3) 50 MCG (2000 UT) TABS, Take 1,000 mg by mouth daily.    erythromycin ophthalmic ointment, Place 1 Application into both eyes at bedtime.   MAGNESIUM GLUCONATE PO, Take 200 mg by mouth 2 (two) times daily.    Reviewed prior external information including notes and imaging from  primary care provider As well as notes that were available from care everywhere and other healthcare systems.  Past medical history, social, surgical and family history all reviewed in electronic medical record.  Smith pertanent information unless stated regarding to the chief complaint.   Review of Systems:  Smith headache, visual changes, nausea, vomiting, diarrhea, constipation, dizziness, abdominal pain, skin rash, fevers, chills, night sweats, weight loss, swollen lymph nodes, body aches, joint swelling, chest pain, shortness of breath, mood changes. POSITIVE muscle aches  Objective  Blood pressure 134/84, pulse 95, height '5\' 4"'$  (1.626 m), weight 170 lb (77.1 kg), SpO2 98 %.   General:  Smith apparent distress alert and oriented x3 mood and affect normal, dressed appropriately.  HEENT: Pupils equal, extraocular movements intact  Respiratory: Patient's speak in full sentences and does not appear short of breath  Cardiovascular: Smith lower extremity edema, non tender, Smith erythema  Right knee pain does show the  patient is tender over diffusely.  Patient does have lateral tracking of the patella noted.  Smith crepitus noted.  Patient does have some mild instability of the knee noted.  Lipoma noted over the anterior medial aspect of the knee  Left shoulder does show some crepitus noted. Limited range of motion in all planes.  Tender to palpation of the acromioclavicular joint.  Limited muscular skeletal ultrasound was performed and interpreted by Hulan Saas, M  Limited imaging of the patient's right knee shows patient does have significant patellofemoral arthritis with a large hypoechoic changes consistent with a large effusion of the patellofemoral joint space.  Patient has moderate narrowing of the medial joint space with some degenerative changes of the meniscus but nothing significant with displacement. Impression: Knee arthritis  Regarding patient's shoulder does have significant narrowing of the acromioclavicular joint with bone-on-bone.  Patient does have significant hypoechoic changes consistent with an effusion.  Does have some underlying arthritis of the glenohumeral joint as well. Impression: AC arthritis and mild to moderate glenohumeral   Impression and Recommendations:    The above documentation has been reviewed and is accurate and complete Lyndal Pulley, DO

## 2022-05-28 ENCOUNTER — Telehealth: Payer: Self-pay | Admitting: Internal Medicine

## 2022-05-28 ENCOUNTER — Ambulatory Visit: Payer: PPO

## 2022-05-28 ENCOUNTER — Ambulatory Visit (INDEPENDENT_AMBULATORY_CARE_PROVIDER_SITE_OTHER): Payer: PPO

## 2022-05-28 ENCOUNTER — Encounter: Payer: Self-pay | Admitting: Family Medicine

## 2022-05-28 ENCOUNTER — Ambulatory Visit: Payer: Self-pay

## 2022-05-28 ENCOUNTER — Ambulatory Visit (INDEPENDENT_AMBULATORY_CARE_PROVIDER_SITE_OTHER): Payer: PPO | Admitting: Family Medicine

## 2022-05-28 VITALS — BP 134/84 | HR 95 | Ht 64.0 in | Wt 170.0 lb

## 2022-05-28 DIAGNOSIS — G8929 Other chronic pain: Secondary | ICD-10-CM

## 2022-05-28 DIAGNOSIS — M25512 Pain in left shoulder: Secondary | ICD-10-CM | POA: Diagnosis not present

## 2022-05-28 DIAGNOSIS — M25561 Pain in right knee: Secondary | ICD-10-CM

## 2022-05-28 DIAGNOSIS — M19012 Primary osteoarthritis, left shoulder: Secondary | ICD-10-CM | POA: Diagnosis not present

## 2022-05-28 DIAGNOSIS — M1711 Unilateral primary osteoarthritis, right knee: Secondary | ICD-10-CM | POA: Diagnosis not present

## 2022-05-28 DIAGNOSIS — M19019 Primary osteoarthritis, unspecified shoulder: Secondary | ICD-10-CM | POA: Insufficient documentation

## 2022-05-28 NOTE — Assessment & Plan Note (Signed)
Significant arthritic changes of the acromioclavicular joint noted on ultrasound today.  Discussed with patient about icing regimen and home exercises, discussed which activities to do and which ones to avoid.  Increase activity slowly otherwise.  Discussed posture and ergonomics.  Follow-up again in 6 to 8 weeks worsening pain consider formal physical therapy, x-rays of injections

## 2022-05-28 NOTE — Telephone Encounter (Signed)
Left message for patient to call back to schedule Medicare Annual Wellness Visit   Last AWV  06/16/21  Please schedule at anytime with [facility] if patient calls the office back.      Any questions, please call me at [phone]

## 2022-05-28 NOTE — Assessment & Plan Note (Signed)
Large joint effusion noted of the right knee.  Patient declined any type of aspiration.  Discussed with patient that this may take time to resolve.  Hesitant for any type of prescription medication but would do topical anti-inflammatories.  Discussed icing regimen and home exercises.  Patient given a Tru pull lite brace.  Discussed topical anti-inflammatories.  Follow-up again in 6 weeks.  Worsening pain consider injection

## 2022-05-28 NOTE — Patient Instructions (Addendum)
Good to see you  Exercises given- shoulder and knee Keep hand within Periferal view  Tru pull lite right knee to wear with piliates and with long walks  Happy Holliday's  Follow up in 6 weeks

## 2022-07-02 ENCOUNTER — Telehealth: Payer: Self-pay | Admitting: Internal Medicine

## 2022-07-02 NOTE — Telephone Encounter (Signed)
Patient called and said that Dr Sharlet Salina wanted her to try a statin before and she would like to try. She said maybe '5mg'$  Crestor. Does patient need an appointment? I scheduled just in case

## 2022-07-05 DIAGNOSIS — D224 Melanocytic nevi of scalp and neck: Secondary | ICD-10-CM | POA: Diagnosis not present

## 2022-07-05 DIAGNOSIS — L905 Scar conditions and fibrosis of skin: Secondary | ICD-10-CM | POA: Diagnosis not present

## 2022-07-05 DIAGNOSIS — L819 Disorder of pigmentation, unspecified: Secondary | ICD-10-CM | POA: Diagnosis not present

## 2022-07-05 DIAGNOSIS — L821 Other seborrheic keratosis: Secondary | ICD-10-CM | POA: Diagnosis not present

## 2022-07-05 DIAGNOSIS — D2271 Melanocytic nevi of right lower limb, including hip: Secondary | ICD-10-CM | POA: Diagnosis not present

## 2022-07-05 DIAGNOSIS — D225 Melanocytic nevi of trunk: Secondary | ICD-10-CM | POA: Diagnosis not present

## 2022-07-05 DIAGNOSIS — Z85828 Personal history of other malignant neoplasm of skin: Secondary | ICD-10-CM | POA: Diagnosis not present

## 2022-07-05 DIAGNOSIS — L570 Actinic keratosis: Secondary | ICD-10-CM | POA: Diagnosis not present

## 2022-07-05 DIAGNOSIS — D2261 Melanocytic nevi of right upper limb, including shoulder: Secondary | ICD-10-CM | POA: Diagnosis not present

## 2022-07-05 MED ORDER — ROSUVASTATIN CALCIUM 5 MG PO TABS: 5.0000 mg | ORAL_TABLET | Freq: Every day | ORAL | 3 refills | Status: DC

## 2022-07-05 NOTE — Telephone Encounter (Signed)
Crestor 5 mg daily sent in. Does not need visit. Recommend to keep next visit as physical due 11/07/22 or later as this will be good timing to recheck cholesterol. Ok to cancel visit.

## 2022-07-06 DIAGNOSIS — H524 Presbyopia: Secondary | ICD-10-CM | POA: Diagnosis not present

## 2022-07-06 DIAGNOSIS — H35371 Puckering of macula, right eye: Secondary | ICD-10-CM | POA: Diagnosis not present

## 2022-07-06 DIAGNOSIS — H25013 Cortical age-related cataract, bilateral: Secondary | ICD-10-CM | POA: Diagnosis not present

## 2022-07-06 DIAGNOSIS — H2513 Age-related nuclear cataract, bilateral: Secondary | ICD-10-CM | POA: Diagnosis not present

## 2022-07-06 DIAGNOSIS — H0288A Meibomian gland dysfunction right eye, upper and lower eyelids: Secondary | ICD-10-CM | POA: Diagnosis not present

## 2022-07-06 LAB — HM DIABETES EYE EXAM

## 2022-07-06 NOTE — Telephone Encounter (Signed)
Contacted patient and informed her about the prescription we have sent in to her pharmacy and that she is more than welcome to call back and cancel upcoming appointment on 01/30 if she would like since she is due for her physical around 05/26

## 2022-07-07 NOTE — Progress Notes (Deleted)
[name] Phone: [phone] Subjective:    I'm seeing this patient by the request  of:  Hoyt Koch, MD  CC:   [id number]  05/28/2022 Large joint effusion noted of the right knee.  Patient declined any type of aspiration.  Discussed with patient that this may take time to resolve.  Hesitant for any type of prescription medication but would do topical anti-inflammatories.  Discussed icing regimen and home exercises.  Patient given a Tru pull lite brace.  Discussed topical anti-inflammatories.  Follow-up again in 6 weeks.  Worsening pain consider injection  Significant arthritic changes of the acromioclavicular joint noted on ultrasound today.  Discussed with patient about icing regimen and home exercises, discussed which activities to do and which ones to avoid.  Increase activity slowly otherwise.  Discussed posture and ergonomics.  Follow-up again in 6 to 8 weeks worsening pain consider formal physical therapy, x-rays of injections  Updated 07/09/2022 Jean Smith is a 72 y.o. female coming in with complaint of L shoulder and R knee       Past Medical History:  Diagnosis Date   Allergy    Anxiety    Aortic regurgitation    moderate by echo 10/2020   Arthritis    Cancer ([employer])    skin   Cataract    bilateral - MD is just watching    Diverticulosis    Fuchs' endothelial dystrophy    follows with optho regularly    Gestational diabetes    Heart murmur    MVP    History of depression    HSV-2 infection    Hyperlipidemia    no meds - diet controlled   Hyperplastic colon polyp    Mitral valve prolapse    mild to moderate MR by echo 10/2020   PVC's (premature ventricular contractions)    intol of BB, sxc palpitations r/t stress   RVOT ventricular tachycardia/PVCs    EP eval 01/2013 for freq PVCs   Past Surgical History:  Procedure Laterality Date   CESAREAN SECTION     x 3    COLONOSCOPY     KNEE SURGERY Right    MANDIBLE SURGERY     right side in front of ear   MOHS SURGERY  2021   POLYPECTOMY     WISDOM TOOTH EXTRACTION     Social History   Socioeconomic History   Marital status: Married    Spouse name: Not on file   Number of children: 3   Years of education: Not on file   Highest education level: Not on file  Occupational History   Not on file  Tobacco Use   Smoking status: Former    Types: Cigarettes    Quit date: 06/15/1983    Years since quitting: 39.0   Smokeless tobacco: Never  Vaping Use   Vaping Use: Never used  Substance and Sexual Activity   Alcohol use: Not Currently    Comment: occasional   Drug use: No   Sexual activity: Not Currently    Birth control/protection: Post-menopausal  Other Topics Concern   Not on file  Social History Narrative   Regular exercise: yes   Caffeine use: none   Social Determinants of Health   Financial Resource Strain: Low Risk  (06/16/2021)   Overall Financial Resource Strain (CARDIA)    Difficulty of Paying Living Expenses: Not hard at all  Food Insecurity: No Food Insecurity (06/16/2021)  Hunger Vital Sign    Worried About Running Out of Food in the Last Year: Never true    Ran Out of Food in the Last Year: Never true  Transportation Needs: No Transportation Needs (06/16/2021)   PRAPARE - Hydrologist (Medical): No    Lack of Transportation (Non-Medical): No  Physical Activity: Sufficiently Active (06/16/2021)   Exercise Vital Sign    Days of Exercise per Week: 5 days    Minutes of Exercise per Session: 30 min  Stress: Stress Concern Present (06/16/2021)   [place]    Feeling of Stress : To some extent  Social Connections: Socially Integrated (06/16/2021)   Social Connection and Isolation Panel [NHANES]    Frequency of Communication with Friends and Family: More than three times a week    Frequency of  Social Gatherings with Friends and Family: More than three times a week    Attends Religious Services: More than 4 times per year    Active Member of [employer] or Organizations: Yes    Attends Music therapist: More than 4 times per year    Marital Status: Married   Allergies  Allergen Reactions   Atorvastatin Other (See Comments)    Sensitive    Crestor [Rosuvastatin Calcium]     Muscle ache   Latex Swelling   Lipitor [Atorvastatin Calcium]     Muscle ache   Other     Grass, local trees, cats and dogs   Pollen Extract    Family History  Problem Relation Age of Onset   Colon cancer Mother        dx'd in her 63's-- stage 2    Heart disease Father    Colon cancer Maternal Grandmother    Colon polyps Sister    Colon polyps Sister    Alcohol abuse Other    Rectal cancer Neg Hx    Stomach cancer Neg Hx    Esophageal cancer Neg Hx      Current Outpatient Medications (Cardiovascular):    rosuvastatin (CRESTOR) 5 MG tablet, Take 1 tablet (5 mg total) by mouth daily.  Current Outpatient Medications (Respiratory):    loratadine (CLARITIN) 10 MG tablet, Take 10 mg by mouth daily as needed for allergies.    Current Outpatient Medications (Other):    ALPRAZolam (XANAX) 0.25 MG tablet, Take 1 tablet (0.25 mg total) by mouth daily as needed for anxiety.   Cholecalciferol (VITAMIN D3) 50 MCG (2000 UT) TABS, Take 1,000 mg by mouth daily.    erythromycin ophthalmic ointment, Place 1 Application into both eyes at bedtime.   MAGNESIUM GLUCONATE PO, Take 200 mg by mouth 2 (two) times daily.    Reviewed prior external information including notes and imaging from  primary care provider As well as notes that were available from care everywhere and other healthcare systems.  Past medical history, social, surgical and family history all reviewed in electronic medical record.  No pertanent information unless stated regarding to the chief complaint.   Review of Systems:  No  headache, visual changes, nausea, vomiting, diarrhea, constipation, dizziness, abdominal pain, skin rash, fevers, chills, night sweats, weight loss, swollen lymph nodes, body aches, joint swelling, chest pain, shortness of breath, mood changes. POSITIVE muscle aches  Objective  There were no vitals taken for this visit.   General: No apparent distress alert and oriented x3 mood and affect normal, dressed appropriately.  HEENT: Pupils equal, extraocular  movements intact  Respiratory: Patient's speak in full sentences and does not appear short of breath  Cardiovascular: No lower extremity edema, non tender, no erythema      Impression and Recommendations:

## 2022-07-09 ENCOUNTER — Ambulatory Visit: Payer: PPO | Admitting: Family Medicine

## 2022-07-13 ENCOUNTER — Ambulatory Visit: Payer: PPO | Admitting: Internal Medicine

## 2022-07-27 NOTE — Progress Notes (Signed)
Jean Smith [address] [place] [address] Phone: [phone] Subjective:   Jean Smith, am serving as a scribe for Dr. Antoine Primas.  I'm seeing this patient by the request  of:  Jean Broker, MD  CC: Shoulder pain, knee pain follow-up  [id number]  05/28/2022 Large joint effusion noted of the right knee. Patient declined any type of aspiration. Discussed with patient that this may take time to resolve. Hesitant for any type of prescription medication but would do topical anti-inflammatories. Discussed icing regimen and home exercises. Patient given a Tru pull lite brace. Discussed topical anti-inflammatories. Follow-up again in 6 weeks. Worsening pain consider injection   Significant arthritic changes of the acromioclavicular joint noted on ultrasound today. Discussed with patient about icing regimen and home exercises, discussed which activities to do and which ones to avoid. Increase activity slowly otherwise. Discussed posture and ergonomics. Follow-up again in 6 to 8 weeks worsening pain consider formal physical therapy, x-rays of injections   Updated 07/28/2022 Jean Smith is a 72 y.o. female coming in with complaint of shoulder and knee pain. Patient states that her shoulder is improving as she has been mindful about keeping her hands where she can see them. Now able to sleep on her L shoulder without pain.   Using more voltaren gel on the knee but the only thing that stops her pain is IBU. Wears Tru-pull lite for Pilates. Makes her lower leg ache at times but does seem to be helpful. Has a hard time walking the day after pilates.   Started a statin on 07/07/2022. Wonders if her knee pain is being exacerbated by this medication.   Asks if it is ok to take 200mg  of IBU at bedtime and apply voltaren gel.     Past Medical History:  Diagnosis Date   Allergy    Anxiety    Aortic regurgitation    moderate by echo 10/2020    Arthritis    Cancer ([facility])    skin   Cataract    bilateral - MD is just watching    Diverticulosis    Fuchs' endothelial dystrophy    follows with optho regularly    Gestational diabetes    Heart murmur    MVP    History of depression    HSV-2 infection    Hyperlipidemia    no meds - diet controlled   Hyperplastic colon polyp    Mitral valve prolapse    mild to moderate MR by echo 10/2020   PVC's (premature ventricular contractions)    intol of BB, sxc palpitations r/t stress   RVOT ventricular tachycardia/PVCs    EP eval 01/2013 for freq PVCs   Past Surgical History:  Procedure Laterality Date   CESAREAN SECTION     x 3   COLONOSCOPY     KNEE SURGERY Right    MANDIBLE SURGERY     right side in front of ear   MOHS SURGERY  2021   POLYPECTOMY     WISDOM TOOTH EXTRACTION     Social History   Socioeconomic History   Marital status: Married    Spouse name: Not on file   Number of children: 3   Years of education: Not on file   Highest education level: Not on file  Occupational History   Not on file  Tobacco Use   Smoking status: Former    Types: Cigarettes    Quit date: 06/15/1983  Years since quitting: 39.1   Smokeless tobacco: Never  Vaping Use   Vaping Use: Never used  Substance and Sexual Activity   Alcohol use: Not Currently    Comment: occasional   Drug use: No   Sexual activity: Not Currently    Birth control/protection: Post-menopausal  Other Topics Concern   Not on file  Social History Narrative   Regular exercise: yes   Caffeine use: none   Social Determinants of Health   Financial Resource Strain: Low Risk  (06/16/2021)   Overall Financial Resource Strain (CARDIA)    Difficulty of Paying Living Expenses: Not hard at all  Food Insecurity: No Food Insecurity (06/16/2021)   Hunger Vital Sign    Worried About Running Out of Food in the Last Year: Never true    Ran Out of Food in the Last Year: Never true  Transportation Needs: No Transportation  Needs (06/16/2021)   PRAPARE - Administrator, Civil Service (Medical): No    Lack of Transportation (Non-Medical): No  Physical Activity: Sufficiently Active (06/16/2021)   Exercise Vital Sign    Days of Exercise per Week: 5 days    Minutes of Exercise per Session: 30 min  Stress: Stress Concern Present (06/16/2021)   [employer] of Occupational Health - Occupational Stress Questionnaire    Feeling of Stress : To some extent  Social Connections: Socially Integrated (06/16/2021)   Social Connection and Isolation Panel [NHANES]    Frequency of Communication with Friends and Family: More than three times a week    Frequency of Social Gatherings with Friends and Family: More than three times a week    Attends Religious Services: More than 4 times per year    Active Member of [employer] or Organizations: Yes    Attends Engineer, structural: More than 4 times per year    Marital Status: Married   Allergies  Allergen Reactions   Atorvastatin Other (See Comments)    Sensitive    Crestor [Rosuvastatin Calcium]     Muscle ache   Latex Swelling   Lipitor [Atorvastatin Calcium]     Muscle ache   Other     Grass, local trees, cats and dogs   Pollen Extract    Family History  Problem Relation Age of Onset   Colon cancer Mother        dx'd in her 103's-- stage 2    Heart disease Father    Colon cancer Maternal Grandmother    Colon polyps Sister    Colon polyps Sister    Alcohol abuse Other    Rectal cancer Neg Hx    Stomach cancer Neg Hx    Esophageal cancer Neg Hx      Current Outpatient Medications (Cardiovascular):    rosuvastatin (CRESTOR) 5 MG tablet, Take 1 tablet (5 mg total) by mouth daily.  Current Outpatient Medications (Respiratory):    loratadine (CLARITIN) 10 MG tablet, Take 10 mg by mouth daily as needed for allergies.    Current Outpatient Medications (Other):    ALPRAZolam (XANAX) 0.25 MG tablet, Take 1 tablet (0.25 mg total) by mouth daily as  needed for anxiety.   Cholecalciferol (VITAMIN D3) 50 MCG (2000 UT) TABS, Take 1,000 mg by mouth daily.    erythromycin ophthalmic ointment, Place 1 Application into both eyes at bedtime.   MAGNESIUM GLUCONATE PO, Take 200 mg by mouth 2 (two) times daily.    Reviewed prior external information including notes and imaging from  primary care provider As well as notes that were available from care everywhere and other healthcare systems.  Past medical history, social, surgical and family history all reviewed in electronic medical record.  No pertanent information unless stated regarding to the chief complaint.   Review of Systems:  No headache, visual changes, nausea, vomiting, diarrhea, constipation, dizziness, abdominal pain, skin rash, fevers, chills, night sweats, weight loss, swollen lymph nodes, body aches, joint swelling, chest pain, shortness of breath, mood changes. POSITIVE muscle aches  Objective  Blood pressure 108/66, pulse (!) 108, height 5\' 4"  (1.626 m), weight 170 lb (77.1 kg), SpO2 98 %.   General: No apparent distress alert and oriented x3 mood and affect normal, dressed appropriately.  HEENT: Pupils equal, extraocular movements intact  Respiratory: Patient's speak in full sentences and does not appear short of breath  Cardiovascular: No lower extremity edema, non tender, no erythema  Right shoulder has significant improvement in range of motion but still has some mild impingement noted.  Right knee exam still has some tenderness to palpation over the patellofemoral joint noted.  Crepitus noted.  Mild valgus deformity of the knee noted.   Limited muscular skeletal ultrasound was performed and interpreted by Antoine Primas, M  Limited ultrasound continues to show the patient does have some hypoechoic changes in the patellofemoral joint noted.  Some degenerative changes of the meniscus noted. Impression: Improvement in inflammation but still continue effusion   Impression  and Recommendations:    The above documentation has been reviewed and is accurate and complete Judi Saa, DO

## 2022-07-28 ENCOUNTER — Ambulatory Visit: Payer: Self-pay

## 2022-07-28 ENCOUNTER — Ambulatory Visit: Payer: PPO | Admitting: Family Medicine

## 2022-07-28 ENCOUNTER — Encounter: Payer: Self-pay | Admitting: Family Medicine

## 2022-07-28 VITALS — BP 108/66 | HR 108 | Ht 64.0 in | Wt 170.0 lb

## 2022-07-28 DIAGNOSIS — M25512 Pain in left shoulder: Secondary | ICD-10-CM

## 2022-07-28 DIAGNOSIS — M1711 Unilateral primary osteoarthritis, right knee: Secondary | ICD-10-CM | POA: Diagnosis not present

## 2022-07-28 DIAGNOSIS — G8929 Other chronic pain: Secondary | ICD-10-CM

## 2022-07-28 NOTE — Patient Instructions (Signed)
Keep doing exercise Wear brace only with exercise 277m Ibuprofen at night is fine And don't look at your husbands feet See you again in 8 weeks

## 2022-07-28 NOTE — Assessment & Plan Note (Addendum)
Patellofemoral does have some lateral tracking noted, crepitus noted.  Discussed with patient about different treatment options.  Patient is concerned of needles and is like to come another time to consider an injection, will continue to use the brace with alternate Tylenol and icing regimen.  Follow-up again in 6 to 8 weeks

## 2022-07-30 ENCOUNTER — Encounter: Payer: Self-pay | Admitting: Gastroenterology

## 2022-08-18 ENCOUNTER — Telehealth: Payer: Self-pay

## 2022-08-18 NOTE — Telephone Encounter (Signed)
Called patient to schedule Medicare Annual Wellness Visit (AWV). Unable to reach patient.  Last date of AWV: 06/16/21  Please schedule an appointment at any time with [facility].   Norton Blizzard, [place] ([facility])  [facility] [phone]

## 2022-08-20 ENCOUNTER — Telehealth: Payer: Self-pay

## 2022-08-20 NOTE — Telephone Encounter (Signed)
Contacted Jean Smith Seen to schedule their annual wellness visit. Appointment made for 08/26/22.  Norton Blizzard, [place] ([facility])  [facility] [phone]

## 2022-09-07 NOTE — Progress Notes (Deleted)
[place] Phone: [phone] Subjective:    I'm seeing this patient by the request  of:  Hoyt Koch, MD  CC:   [id number]  07/28/2022 Patellofemoral does have some lateral tracking noted, crepitus noted.  Discussed with patient about different treatment options.  Patient is concerned of needles and is like to come another time to consider an injection, will continue to use the brace with alternate Tylenol and icing regimen.  Follow-up again in 6 to 8 weeks   Updated 09/15/2022 Jean Smith is a 72 y.o. female coming in with complaint of R knee L shoulder pain  Onset-  Location Duration-  Character- Aggravating factors- Reliving factors-  Therapies tried-  Severity-     Past Medical History:  Diagnosis Date   Allergy    Anxiety    Aortic regurgitation    moderate by echo 10/2020   Arthritis    Cancer ([facility])    skin   Cataract    bilateral - MD is just watching    Diverticulosis    Fuchs' endothelial dystrophy    follows with optho regularly    Gestational diabetes    Heart murmur    MVP    History of depression    HSV-2 infection    Hyperlipidemia    no meds - diet controlled   Hyperplastic colon polyp    Mitral valve prolapse    mild to moderate MR by echo 10/2020   PVC's (premature ventricular contractions)    intol of BB, sxc palpitations r/t stress   RVOT ventricular tachycardia/PVCs    EP eval 01/2013 for freq PVCs   Past Surgical History:  Procedure Laterality Date   CESAREAN SECTION     x 3   COLONOSCOPY     KNEE SURGERY Right    MANDIBLE SURGERY     right side in front of ear   MOHS SURGERY  2021   POLYPECTOMY     WISDOM TOOTH EXTRACTION     Social History   Socioeconomic History   Marital status: Married    Spouse name: Not on file   Number of children: 3   Years of education: Not on file   Highest education level: Not on file  Occupational History    Not on file  Tobacco Use   Smoking status: Former    Types: Cigarettes    Quit date: 06/15/1983    Years since quitting: 39.2   Smokeless tobacco: Never  Vaping Use   Vaping Use: Never used  Substance and Sexual Activity   Alcohol use: Not Currently    Comment: occasional   Drug use: No   Sexual activity: Not Currently    Birth control/protection: Post-menopausal  Other Topics Concern   Not on file  Social History Narrative   Regular exercise: yes   Caffeine use: none   Social Determinants of Health   Financial Resource Strain: Low Risk  (06/16/2021)   Overall Financial Resource Strain (CARDIA)    Difficulty of Paying Living Expenses: Not hard at all  Food Insecurity: No Food Insecurity (06/16/2021)   Hunger Vital Sign    Worried About Running Out of Food in the Last Year: Never true    Ran Out of Food in the Last Year: Never true  Transportation Needs: No Transportation Needs (06/16/2021)   PRAPARE - Hydrologist (Medical): No    Lack of Transportation (  Non-Medical): No  Physical Activity: Sufficiently Active (06/16/2021)   Exercise Vital Sign    Days of Exercise per Week: 5 days    Minutes of Exercise per Session: 30 min  Stress: Stress Concern Present (06/16/2021)   [place]    Feeling of Stress : To some extent  Social Connections: Socially Integrated (06/16/2021)   Social Connection and Isolation Panel [NHANES]    Frequency of Communication with Friends and Family: More than three times a week    Frequency of Social Gatherings with Friends and Family: More than three times a week    Attends Religious Services: More than 4 times per year    Active Member of [employer] or Organizations: Yes    Attends Music therapist: More than 4 times per year    Marital Status: Married   Allergies  Allergen Reactions   Atorvastatin Other (See Comments)    Sensitive    Crestor  [Rosuvastatin Calcium]     Muscle ache   Latex Swelling   Lipitor [Atorvastatin Calcium]     Muscle ache   Other     Grass, local trees, cats and dogs   Pollen Extract    Family History  Problem Relation Age of Onset   Colon cancer Mother        dx'd in her 25's-- stage 2    Heart disease Father    Colon cancer Maternal Grandmother    Colon polyps Sister    Colon polyps Sister    Alcohol abuse Other    Rectal cancer Neg Hx    Stomach cancer Neg Hx    Esophageal cancer Neg Hx      Current Outpatient Medications (Cardiovascular):    rosuvastatin (CRESTOR) 5 MG tablet, Take 1 tablet (5 mg total) by mouth daily.  Current Outpatient Medications (Respiratory):    loratadine (CLARITIN) 10 MG tablet, Take 10 mg by mouth daily as needed for allergies.    Current Outpatient Medications (Other):    ALPRAZolam (XANAX) 0.25 MG tablet, Take 1 tablet (0.25 mg total) by mouth daily as needed for anxiety.   Cholecalciferol (VITAMIN D3) 50 MCG (2000 UT) TABS, Take 1,000 mg by mouth daily.    erythromycin ophthalmic ointment, Place 1 Application into both eyes at bedtime.   MAGNESIUM GLUCONATE PO, Take 200 mg by mouth 2 (two) times daily.    Reviewed prior external information including notes and imaging from  primary care provider As well as notes that were available from care everywhere and other healthcare systems.  Past medical history, social, surgical and family history all reviewed in electronic medical record.  No pertanent information unless stated regarding to the chief complaint.   Review of Systems:  No headache, visual changes, nausea, vomiting, diarrhea, constipation, dizziness, abdominal pain, skin rash, fevers, chills, night sweats, weight loss, swollen lymph nodes, body aches, joint swelling, chest pain, shortness of breath, mood changes. POSITIVE muscle aches  Objective  There were no vitals taken for this visit.   General: No apparent distress alert and oriented x3  mood and affect normal, dressed appropriately.  HEENT: Pupils equal, extraocular movements intact  Respiratory: Patient's speak in full sentences and does not appear short of breath  Cardiovascular: No lower extremity edema, non tender, no erythema      Impression and Recommendations:

## 2022-09-08 ENCOUNTER — Encounter: Payer: Self-pay | Admitting: Gastroenterology

## 2022-09-08 ENCOUNTER — Ambulatory Visit (AMBULATORY_SURGERY_CENTER): Payer: PPO

## 2022-09-08 VITALS — Ht 64.0 in | Wt 170.0 lb

## 2022-09-08 DIAGNOSIS — Z86010 Personal history of colonic polyps: Secondary | ICD-10-CM

## 2022-09-08 MED ORDER — NA SULFATE-K SULFATE-MG SULF 17.5-3.13-1.6 GM/177ML PO SOLN: 1.0000 | Freq: Once | ORAL | 0 refills | Status: AC

## 2022-09-08 NOTE — Progress Notes (Addendum)

## 2022-09-15 ENCOUNTER — Ambulatory Visit: Payer: PPO | Admitting: Family Medicine

## 2022-09-20 ENCOUNTER — Telehealth: Payer: Self-pay | Admitting: Gastroenterology

## 2022-09-20 NOTE — Telephone Encounter (Signed)
Inbound call from patient , she have a procedure for Friday but she is not feeling good and she is not sure whether or not to keep appt.Please advise

## 2022-09-20 NOTE — Telephone Encounter (Signed)
Returned the patient's phone call. She has been having diarrhea and nausea the past few days, no fever. The diarrhea is unusual for her. She has a Colonoscopy on Friday with Dr. Lavon Paganini. Encouraged her to let us know how she is feeling Thursday morning and we can determine if she should be canceled at that time.

## 2022-09-21 ENCOUNTER — Encounter: Payer: Self-pay | Admitting: Internal Medicine

## 2022-09-21 ENCOUNTER — Ambulatory Visit (INDEPENDENT_AMBULATORY_CARE_PROVIDER_SITE_OTHER): Payer: PPO | Admitting: Internal Medicine

## 2022-09-21 VITALS — BP 112/64 | HR 87 | Temp 98.0°F | Ht 64.0 in | Wt 171.0 lb

## 2022-09-21 DIAGNOSIS — R197 Diarrhea, unspecified: Secondary | ICD-10-CM

## 2022-09-21 NOTE — Patient Instructions (Addendum)
You can add a probiotic to help the diarrhea.   It is okay to use imodium to help.   Make sure to drink plenty of fluid. Let us know if this does not go away within the next week.

## 2022-09-21 NOTE — Progress Notes (Signed)
   Subjective:   Patient ID: Jean Smith, female    DOB: 1950/08/03, 72 y.o.   MRN: [id number]  HPI The patient is a 72 YO female coming in for diarrhea. Has colonoscopy scheduled Friday she likely will cancel. Started about 1 week ago with liquid diarrhea. Cannot quantify how many time lately. Took pepto bismol.   Review of Systems  Constitutional: Negative.   HENT: Negative.    Eyes: Negative.   Respiratory:  Negative for cough, chest tightness and shortness of breath.   Cardiovascular:  Negative for chest pain, palpitations and leg swelling.  Gastrointestinal:  Positive for diarrhea. Negative for abdominal distention, abdominal pain, anal bleeding, blood in stool, constipation, nausea and vomiting.  Musculoskeletal: Negative.   Skin: Negative.   Neurological: Negative.   Psychiatric/Behavioral: Negative.      Objective:  Physical Exam Constitutional:      Appearance: She is well-developed.  HENT:     Head: Normocephalic and atraumatic.  Cardiovascular:     Rate and Rhythm: Normal rate and regular rhythm.  Pulmonary:     Effort: Pulmonary effort is normal. No respiratory distress.     Breath sounds: Normal breath sounds. No wheezing or rales.  Abdominal:     General: Bowel sounds are normal. There is no distension.     Palpations: Abdomen is soft.     Tenderness: There is no abdominal tenderness. There is no rebound.  Musculoskeletal:     Cervical back: Normal range of motion.  Skin:    General: Skin is warm and dry.  Neurological:     Mental Status: She is alert and oriented to person, place, and time.     Coordination: Coordination normal.     Vitals:   09/21/22 0913  BP: 112/64  Pulse: 87  Temp: 98 F (36.7 C)  TempSrc: Oral  SpO2: 96%  Weight: 171 lb (77.6 kg)  Height: 5\' 4"  (1.626 m)    Assessment & Plan:

## 2022-09-21 NOTE — Assessment & Plan Note (Addendum)
Presumed viral gastroenteritis with 1-2 days intense diarrhea. No vomiting. Some intermittent diarrhea since then. She is not able to quantify well. Took imodium Sunday night and no diarrhea today. Maybe 1-2 episodes yesterday. Monitor and BRAT diet and let us know if not improving by end of week we can do stool specimen. No recent antibiotics to suggest C dif.

## 2022-09-24 ENCOUNTER — Encounter: Payer: PPO | Admitting: Gastroenterology

## 2022-09-28 ENCOUNTER — Other Ambulatory Visit: Payer: PPO

## 2022-09-28 ENCOUNTER — Other Ambulatory Visit: Payer: Self-pay

## 2022-09-28 ENCOUNTER — Telehealth: Payer: Self-pay

## 2022-09-28 DIAGNOSIS — R197 Diarrhea, unspecified: Secondary | ICD-10-CM

## 2022-09-28 NOTE — Telephone Encounter (Signed)
Pt calls with concerns diarrhea and nausea and increased gas beginning Sunday after Easter. She reports a Upset stomach churning pain does not feel like the pain she has when she's had diverticulitis typically starts at the top of her abdomen after eating. Reports symptoms are worst at night. Per pt no one else in her family is sick or has been sick so she does not think this is a virus. Pt reports taking imodium which helped with diarrhea but not with the stomach uneasiness she has been having. Pt went a few days thinking she had rid herself of the issues but reports it has now come back. The stools are liquid and per pt have a super foul odor.  Pt was seen by her PCP and told to come if the issues persist. Pt goes back to PCP 09/29/22. She is scheduled for colonoscopy this Friday. She is concerned about having to prep thru this which I offered a rescheduling for. PT is requesting your advise on what her next steps should be as she has been dealing with these symptoms for > 1 week.

## 2022-09-28 NOTE — Telephone Encounter (Signed)
Agree with rescheduling the procedure given concern for possible acute infectious diarrhea.  Please advise patient to come in for stool test, C. difficile and stool culture.  Drink sips of Pedialyte to maintain adequate hydration and continue diet as tolerated

## 2022-09-28 NOTE — Telephone Encounter (Signed)
Inbound call from patient states she is experiencing nausea and diarrhea. Patient states she will call and reschedule upcoming procedure before Thursday if she is still having these complications. Requesting to speak with a nurse to be further advised. She Can be contacted anytime  Thank you

## 2022-09-28 NOTE — Telephone Encounter (Signed)
Spoke with the patient and rescheduled her procedure date. I will mail new instructions to her.

## 2022-09-29 ENCOUNTER — Ambulatory Visit: Payer: PPO | Admitting: Internal Medicine

## 2022-09-29 ENCOUNTER — Ambulatory Visit: Payer: PPO

## 2022-09-29 DIAGNOSIS — R197 Diarrhea, unspecified: Secondary | ICD-10-CM

## 2022-10-01 ENCOUNTER — Encounter: Payer: PPO | Admitting: Gastroenterology

## 2022-10-01 LAB — STOOL CULTURE

## 2022-10-01 NOTE — Telephone Encounter (Signed)
Spoke with the patient. Advised there are not any finalized stool studies yet. We will contact as soon as we know the results. Patient reports feeling better today. 2 soft stools today. Admits to tenesmus.

## 2022-10-01 NOTE — Telephone Encounter (Signed)
Inbound call from patient f/u on lab results. Please advise .  Thank you

## 2022-10-02 LAB — CLOSTRIDIUM DIFFICILE BY PCR: Toxigenic C. Difficile by PCR: NEGATIVE

## 2022-10-06 LAB — STOOL CULTURE: E coli, Shiga toxin Assay: NEGATIVE

## 2022-10-07 LAB — STOOL CULTURE

## 2022-10-17 ENCOUNTER — Encounter: Payer: Self-pay | Admitting: Certified Registered Nurse Anesthetist

## 2022-10-17 NOTE — Progress Notes (Unsigned)
[facility] [address] [place] [address] Phone: [phone] Subjective:   INadine Counts, am serving as a scribe for Dr. Antoine Primas.  I'm seeing this patient by the request  of:  Myrlene Broker, MD  CC: right knee and left shoulder pain   [id number]  07/28/2022 Patellofemoral does have some lateral tracking noted, crepitus noted.  Discussed with patient about different treatment options.  Patient is concerned of needles and is like to come another time to consider an injection, will continue to use the brace with alternate Tylenol and icing regimen.  Follow-up again in 6 to 8 weeks   Updated 5/6/20024 LASHANNON TORTORICH is a 72 y.o. female coming in with complaint of knee pain. Canceled previous appointments because it was feeling better. While doing pilate's no pain, but after walking can be painful. Pain mostly medial, but sometimes goes lateral. Knee isn't worse.   Xrays mild arthritis of both shoulder and knee   Past Medical History:  Diagnosis Date   Allergy    Anxiety    Aortic regurgitation    moderate by echo 10/2020   Arthritis    Cancer ([facility])    skin   Cataract    bilateral - MD is just watching    Diverticulosis    Fuchs' endothelial dystrophy    follows with optho regularly    GERD (gastroesophageal reflux disease)    Gestational diabetes    Heart murmur    MVP    History of depression    HSV-2 infection    Hyperlipidemia    no meds - diet controlled   Hyperplastic colon polyp    Mitral valve prolapse    mild to moderate MR by echo 10/2020   PVC's (premature ventricular contractions)    intol of BB, sxc palpitations r/t stress   RVOT ventricular tachycardia/PVCs    EP eval 01/2013 for freq PVCs   Past Surgical History:  Procedure Laterality Date   CESAREAN SECTION     x 3   COLONOSCOPY     KNEE SURGERY Right    MANDIBLE SURGERY     right side in front of ear   MOHS SURGERY  2021   POLYPECTOMY      WISDOM TOOTH EXTRACTION     Social History   Socioeconomic History   Marital status: Married    Spouse name: Not on file   Number of children: 3   Years of education: Not on file   Highest education level: Not on file  Occupational History   Not on file  Tobacco Use   Smoking status: Former    Types: Cigarettes    Quit date: 06/15/1983    Years since quitting: 39.3   Smokeless tobacco: Never  Vaping Use   Vaping Use: Never used  Substance and Sexual Activity   Alcohol use: Not Currently    Comment: occasional   Drug use: No   Sexual activity: Not Currently    Birth control/protection: Post-menopausal  Other Topics Concern   Not on file  Social History Narrative   Regular exercise: yes   Caffeine use: none   Social Determinants of Health   Financial Resource Strain: Low Risk  (06/16/2021)   Overall Financial Resource Strain (CARDIA)    Difficulty of Paying Living Expenses: Not hard at all  Food Insecurity: No Food Insecurity (06/16/2021)   Hunger Vital Sign    Worried About Running Out of Food in the  Last Year: Never true    Ran Out of Food in the Last Year: Never true  Transportation Needs: No Transportation Needs (06/16/2021)   PRAPARE - Administrator, Civil Service (Medical): No    Lack of Transportation (Non-Medical): No  Physical Activity: Sufficiently Active (06/16/2021)   Exercise Vital Sign    Days of Exercise per Week: 5 days    Minutes of Exercise per Session: 30 min  Stress: Stress Concern Present (06/16/2021)   [employer] of Occupational Health - Occupational Stress Questionnaire    Feeling of Stress : To some extent  Social Connections: Socially Integrated (06/16/2021)   Social Connection and Isolation Panel [NHANES]    Frequency of Communication with Friends and Family: More than three times a week    Frequency of Social Gatherings with Friends and Family: More than three times a week    Attends Religious Services: More than 4 times per year     Active Member of [employer] or Organizations: Yes    Attends Engineer, structural: More than 4 times per year    Marital Status: Married   Allergies  Allergen Reactions   Atorvastatin Other (See Comments)    Sensitive years ago Patient is currently taking crestor 5 mg   Latex Swelling   Other     Grass, local trees, cats and dogs   Pollen Extract    Family History  Problem Relation Age of Onset   Colon cancer Mother        dx'd in her 69's-- stage 2    Heart disease Father    Colon polyps Sister    Colon polyps Sister    Colon cancer Maternal Grandmother    Colon polyps Daughter    Alcohol abuse Other    Rectal cancer Neg Hx    Stomach cancer Neg Hx    Esophageal cancer Neg Hx      Current Outpatient Medications (Cardiovascular):    propranolol (INDERAL) 10 MG tablet, Take 10 mg by mouth as needed (increased HR and skipped beats).   rosuvastatin (CRESTOR) 5 MG tablet, Take 1 tablet (5 mg total) by mouth daily.  Current Outpatient Medications (Respiratory):    loratadine (CLARITIN) 10 MG tablet, Take 10 mg by mouth daily as needed for allergies.    Current Outpatient Medications (Other):    ALPRAZolam (XANAX) 0.25 MG tablet, Take 1 tablet (0.25 mg total) by mouth daily as needed for anxiety.   ascorbic acid (VITAMIN C) 500 MG tablet, Take 500 mg by mouth every 3 (three) days. [phone] MG   b complex vitamins capsule, Take 1 capsule by mouth every 3 (three) days.   Cholecalciferol (VITAMIN D-3) 125 MCG (5000 UT) TABS, Take 1 tablet by mouth every 3 (three) days.   erythromycin ophthalmic ointment, Place 1 Application into both eyes at bedtime.   MAGNESIUM GLUCONATE PO, Take 400 mg by mouth daily.   Turmeric (QC TUMERIC COMPLEX PO), Take by mouth every 3 (three) days.   Reviewed prior external information including notes and imaging from  primary care provider As well as notes that were available from care everywhere and other healthcare systems.  Past medical  history, social, surgical and family history all reviewed in electronic medical record.  No pertanent information unless stated regarding to the chief complaint.   Review of Systems:  No headache, visual changes, nausea, vomiting, diarrhea, constipation, dizziness, abdominal pain, skin rash, fevers, chills, night sweats, weight loss, swollen lymph nodes, body aches,  joint swelling, chest pain, shortness of breath, mood changes. POSITIVE muscle aches  Objective  Blood pressure 128/64, pulse (!) 102, height 5\' 4"  (1.626 m), weight 172 lb (78 kg), SpO2 95 %.   General: No apparent distress alert and oriented x3 mood and affect normal, dressed appropriately.  HEENT: Pupils equal, extraocular movements intact  Respiratory: Patient's speak in full sentences and does not appear short of breath  Cardiovascular: No lower extremity edema, non tender, no erythema  Right knee exam shows lateral tracking of the patella noted.  Patient does have some crepitus noted.  Some instability with valgus and varus force.  Tender to palpation over the medial joint line     Impression and Recommendations:    The above documentation has been reviewed and is accurate and complete Judi Saa, DO

## 2022-10-18 ENCOUNTER — Ambulatory Visit (INDEPENDENT_AMBULATORY_CARE_PROVIDER_SITE_OTHER): Payer: PPO | Admitting: Family Medicine

## 2022-10-18 ENCOUNTER — Encounter: Payer: Self-pay | Admitting: Family Medicine

## 2022-10-18 VITALS — BP 128/64 | HR 102 | Ht 64.0 in | Wt 172.0 lb

## 2022-10-18 DIAGNOSIS — M1711 Unilateral primary osteoarthritis, right knee: Secondary | ICD-10-CM | POA: Diagnosis not present

## 2022-10-18 NOTE — Patient Instructions (Signed)
Read about PRP Continue to stay active The exercises are key See you again in 3 months

## 2022-10-18 NOTE — Assessment & Plan Note (Signed)
Has had varicose vein but does not appear to need any intervention

## 2022-10-18 NOTE — Assessment & Plan Note (Signed)
Discussed with patient again at great length.  Discussed different treatment options.  Patient was interested in the possibility of PRP after discussing viscosupplementation as well as corticosteroid injections as well.  Discussed which activities to do and which ones to avoid.  Discussed the importance of stability of the knee.  Discussed icing regimen.  Will follow-up again in 6 to 8 weeks.  Total time with patient today 63

## 2022-10-21 ENCOUNTER — Encounter: Payer: Self-pay | Admitting: Gastroenterology

## 2022-10-21 ENCOUNTER — Ambulatory Visit (AMBULATORY_SURGERY_CENTER): Payer: PPO | Admitting: Gastroenterology

## 2022-10-21 VITALS — BP 112/50 | HR 85 | Temp 97.5°F | Resp 24 | Ht 64.0 in | Wt 170.0 lb

## 2022-10-21 DIAGNOSIS — Z86010 Personal history of colonic polyps: Secondary | ICD-10-CM | POA: Diagnosis not present

## 2022-10-21 DIAGNOSIS — F419 Anxiety disorder, unspecified: Secondary | ICD-10-CM | POA: Diagnosis not present

## 2022-10-21 DIAGNOSIS — D120 Benign neoplasm of cecum: Secondary | ICD-10-CM | POA: Diagnosis not present

## 2022-10-21 DIAGNOSIS — Z09 Encounter for follow-up examination after completed treatment for conditions other than malignant neoplasm: Secondary | ICD-10-CM

## 2022-10-21 DIAGNOSIS — D122 Benign neoplasm of ascending colon: Secondary | ICD-10-CM

## 2022-10-21 DIAGNOSIS — K635 Polyp of colon: Secondary | ICD-10-CM | POA: Diagnosis not present

## 2022-10-21 DIAGNOSIS — I493 Ventricular premature depolarization: Secondary | ICD-10-CM | POA: Diagnosis not present

## 2022-10-21 MED ORDER — SODIUM CHLORIDE 0.9 % IV SOLN: 500.0000 mL | Freq: Once | INTRAVENOUS | Status: DC

## 2022-10-21 NOTE — Progress Notes (Signed)
[facility] History and Physical   Primary Care Physician:  Myrlene Broker, MD   Reason for Procedure:  History of adenomatous colon polyps, family h/o colon cancer  Plan:    Surveillance colonoscopy with possible interventions as needed     HPI: Jean Smith is a very pleasant 72 y.o. female here for surveillance colonoscopy. Denies any nausea, vomiting, abdominal pain, melena or bright red blood per rectum  The risks and benefits as well as alternatives of endoscopic procedure(s) have been discussed and reviewed. All questions answered. The patient agrees to proceed.    Past Medical History:  Diagnosis Date   Allergy    Anxiety    Aortic regurgitation    moderate by echo 10/2020   Arthritis    Cancer (HCC)    skin   Cataract    bilateral - MD is just watching    Diverticulosis    Fuchs' endothelial dystrophy    follows with optho regularly    GERD (gastroesophageal reflux disease)    Gestational diabetes    Heart murmur    MVP    History of depression    HSV-2 infection    Hyperlipidemia    no meds - diet controlled   Hyperplastic colon polyp    Mitral valve prolapse    mild to moderate MR by echo 10/2020   PVC's (premature ventricular contractions)    intol of BB, sxc palpitations r/t stress   RVOT ventricular tachycardia/PVCs    EP eval 01/2013 for freq PVCs    Past Surgical History:  Procedure Laterality Date   CESAREAN SECTION     x 3   COLONOSCOPY     KNEE SURGERY Right    MANDIBLE SURGERY     right side in front of ear   MOHS SURGERY  2021   POLYPECTOMY     WISDOM TOOTH EXTRACTION      Prior to Admission medications   Medication Sig Start Date End Date Taking? Authorizing Provider  ALPRAZolam (XANAX) 0.25 MG tablet Take 1 tablet (0.25 mg total) by mouth daily as needed for anxiety. 05/20/20  Yes Myrlene Broker, MD  ascorbic acid (VITAMIN C) 500 MG tablet Take 500 mg by mouth every 3 (three) days. [id number] MG   Yes  [provider]  b complex vitamins capsule Take 1 capsule by mouth every 3 (three) days.   Yes [provider]  Cholecalciferol (VITAMIN D-3) 125 MCG (5000 UT) TABS Take 1 tablet by mouth every 3 (three) days.   Yes [provider]  MAGNESIUM GLUCONATE PO Take 400 mg by mouth daily.   Yes [provider]  rosuvastatin (CRESTOR) 5 MG tablet Take 1 tablet (5 mg total) by mouth daily. 07/05/22  Yes Myrlene Broker, MD  Turmeric (QC TUMERIC COMPLEX PO) Take by mouth every 3 (three) days.   Yes [provider]  erythromycin ophthalmic ointment Place 1 Application into both eyes at bedtime. 04/16/22   Pincus Sanes, MD  loratadine (CLARITIN) 10 MG tablet Take 10 mg by mouth daily as needed for allergies.    [provider]  propranolol (INDERAL) 10 MG tablet Take 10 mg by mouth as needed (increased HR and skipped beats).    [provider]    Current Outpatient Medications  Medication Sig Dispense Refill   ALPRAZolam (XANAX) 0.25 MG tablet Take 1 tablet (0.25 mg total) by mouth daily as needed for anxiety. 90 tablet 1   ascorbic acid (  VITAMIN C) 500 MG tablet Take 500 mg by mouth every 3 (three) days. [id number] MG     b complex vitamins capsule Take 1 capsule by mouth every 3 (three) days.     Cholecalciferol (VITAMIN D-3) 125 MCG (5000 UT) TABS Take 1 tablet by mouth every 3 (three) days.     MAGNESIUM GLUCONATE PO Take 400 mg by mouth daily.     rosuvastatin (CRESTOR) 5 MG tablet Take 1 tablet (5 mg total) by mouth daily. 90 tablet 3   Turmeric (QC TUMERIC COMPLEX PO) Take by mouth every 3 (three) days.     erythromycin ophthalmic ointment Place 1 Application into both eyes at bedtime. 3.5 g 0   loratadine (CLARITIN) 10 MG tablet Take 10 mg by mouth daily as needed for allergies.     propranolol (INDERAL) 10 MG tablet Take 10 mg by mouth as needed (increased HR and skipped beats).     No current facility-administered medications  for this visit.    Allergies as of 10/21/2022 - Review Complete 10/21/2022  Allergen Reaction Noted   Atorvastatin Other (See Comments) 10/18/2014   Latex Swelling 12/01/2012   Other  09/14/2013   Pollen extract  09/14/2013    Family History  Problem Relation Age of Onset   Colon cancer Mother        dx'd in her 89's-- stage 2    Heart disease Father    Colon polyps Sister    Colon polyps Sister    Colon cancer Maternal Grandmother    Colon polyps Daughter    Alcohol abuse Other    Rectal cancer Neg Hx    Stomach cancer Neg Hx    Esophageal cancer Neg Hx     Social History   Socioeconomic History   Marital status: Married    Spouse name: Not on file   Number of children: 3   Years of education: Not on file   Highest education level: Not on file  Occupational History   Not on file  Tobacco Use   Smoking status: Former    Types: Cigarettes    Quit date: 06/15/1983    Years since quitting: 39.3   Smokeless tobacco: Never  Vaping Use   Vaping Use: Never used  Substance and Sexual Activity   Alcohol use: Not Currently    Comment: occasional   Drug use: No   Sexual activity: Not Currently    Birth control/protection: Post-menopausal  Other Topics Concern   Not on file  Social History Narrative   Regular exercise: yes   Caffeine use: none   Social Determinants of Health   Financial Resource Strain: Low Risk  (06/16/2021)   Overall Financial Resource Strain (CARDIA)    Difficulty of Paying Living Expenses: Not hard at all  Food Insecurity: No Food Insecurity (06/16/2021)   Hunger Vital Sign    Worried About Running Out of Food in the Last Year: Never true    Ran Out of Food in the Last Year: Never true  Transportation Needs: No Transportation Needs (06/16/2021)   PRAPARE - Administrator, Civil Service (Medical): No    Lack of Transportation (Non-Medical): No  Physical Activity: Sufficiently Active (06/16/2021)   Exercise Vital Sign    Days of Exercise  per Week: 5 days    Minutes of Exercise per Session: 30 min  Stress: Stress Concern Present (06/16/2021)   [employer] of Occupational Health - Occupational Stress Questionnaire    Feeling of  Stress : To some extent  Social Connections: Socially Integrated (06/16/2021)   Social Connection and Isolation Panel [NHANES]    Frequency of Communication with Friends and Family: More than three times a week    Frequency of Social Gatherings with Friends and Family: More than three times a week    Attends Religious Services: More than 4 times per year    Active Member of [employer] or Organizations: Yes    Attends Engineer, structural: More than 4 times per year    Marital Status: Married  Catering manager Violence: Not At Risk (06/16/2021)   Humiliation, Afraid, Rape, and Kick questionnaire    Fear of Current or Ex-Partner: No    Emotionally Abused: No    Physically Abused: No    Sexually Abused: No    Review of Systems:  All other review of systems negative except as mentioned in the HPI.  Physical Exam: Vital signs in last 24 hours: Blood Pressure 129/62   Pulse 100   Temperature (Abnormal) 97.5 F (36.4 C)   Height 5\' 4"  (1.626 m)   Weight 170 lb (77.1 kg)   Oxygen Saturation 95%   Body Mass Index 29.18 kg/m  General:   Alert, NAD Lungs:  Clear .   Heart:  Regular rate and rhythm Abdomen:  Soft, nontender and nondistended. Neuro/Psych:  Alert and cooperative. Normal mood and affect. A and O x 3  Reviewed labs, radiology imaging, old records and pertinent past GI work up  Patient is appropriate for planned procedure(s) and anesthesia in an ambulatory setting   K. Scherry Ran , MD [phone]

## 2022-10-21 NOTE — Progress Notes (Signed)
Report given to PACU, vss 

## 2022-10-21 NOTE — Op Note (Signed)
[facility] Patient Name: Jean Smith Procedure Date: 10/21/2022 10:15 AM MRN: [id number] Endoscopist: Napoleon Form , MD, [id number] Age: 72 Referring MD:  Date of Birth: 1950-11-17 Gender: Female Account #: [id number] Procedure:                Colonoscopy Indications:              Screening in patient at increased risk: Colorectal                            cancer in mother 9 or older, High risk colon                            cancer surveillance: Personal history of adenoma                            (10 mm or greater in size) Medicines:                Monitored Anesthesia Care Procedure:                Pre-Anesthesia Assessment:                           - Prior to the procedure, a History and Physical                            was performed, and patient medications and                            allergies were reviewed. The patient's tolerance of                            previous anesthesia was also reviewed. The risks                            and benefits of the procedure and the sedation                            options and risks were discussed with the patient.                            All questions were answered, and informed consent                            was obtained. Prior Anticoagulants: The patient has                            taken no anticoagulant or antiplatelet agents. ASA                            Grade Assessment: III - A patient with severe                            systemic disease. After reviewing the risks and  benefits, the patient was deemed in satisfactory                            condition to undergo the procedure.                           After obtaining informed consent, the colonoscope                            was passed under direct vision. Throughout the                            procedure, the patient's blood pressure, pulse, and                            oxygen saturations were  monitored continuously. The                            Olympus PCF-H190DL (#[id number]) Colonoscope was                            introduced through the anus and advanced to the the                            cecum, identified by appendiceal orifice and                            ileocecal valve. The colonoscopy was performed                            without difficulty. The patient tolerated the                            procedure well. The quality of the bowel                            preparation was good. The ileocecal valve,                            appendiceal orifice, and rectum were photographed. Scope In: 10:24:23 AM Scope Out: 10:35:06 AM Scope Withdrawal Time: 0 hours 7 minutes 10 seconds  Total Procedure Duration: 0 hours 10 minutes 43 seconds  Findings:                 The perianal and digital rectal examinations were                            normal.                           Two sessile polyps were found in the ascending                            colon and cecum. The polyps were 6 to 11 mm in  size. These polyps were removed with a cold snare.                            Resection and retrieval were complete.                           Scattered large-mouthed, medium-mouthed and                            small-mouthed diverticula were found in the sigmoid                            colon, descending colon, transverse colon and                            ascending colon.                           Non-bleeding external and internal hemorrhoids were                            found during retroflexion. The hemorrhoids were                            medium-sized. Complications:            No immediate complications. Estimated Blood Loss:     Estimated blood loss was minimal. Impression:               - Two 6 to 11 mm polyps in the ascending colon and                            in the cecum, removed with a cold snare. Resected                             and retrieved.                           - Moderate diverticulosis in the sigmoid colon, in                            the descending colon, in the transverse colon and                            in the ascending colon.                           - Non-bleeding external and internal hemorrhoids. Recommendation:           - Patient has a contact number available for                            emergencies. The signs and symptoms of potential                            delayed complications were discussed with the  patient. Return to normal activities tomorrow.                            Written discharge instructions were provided to the                            patient.                           - Resume previous diet.                           - Continue present medications.                           - Await pathology results.                           - Repeat colonoscopy in 3 years for surveillance                            based on pathology results. Napoleon Form, MD 10/21/2022 10:42:44 AM This report has been signed electronically.

## 2022-10-21 NOTE — Patient Instructions (Signed)
Repeat colonoscopy in 3 years  YOU HAD AN ENDOSCOPIC PROCEDURE TODAY: Refer to the procedure report and other information in the discharge instructions given to you for any specific questions about what was found during the examination. If this information does not answer your questions, please call [facility] office at [phone] to clarify.   YOU SHOULD EXPECT: Some feelings of bloating in the abdomen. Passage of more gas than usual. Walking can help get rid of the air that was put into your GI tract during the procedure and reduce the bloating. If you had a lower endoscopy (such as a colonoscopy or flexible sigmoidoscopy) you may notice spotting of blood in your stool or on the toilet paper. Some abdominal soreness may be present for a day or two, also.  DIET: Your first meal following the procedure should be a light meal and then it is ok to progress to your normal diet. A half-sandwich or bowl of soup is an example of a good first meal. Heavy or fried foods are harder to digest and may make you feel nauseous or bloated. Drink plenty of fluids but you should avoid alcoholic beverages for 24 hours. If you had a esophageal dilation, please see attached instructions for diet.    ACTIVITY: Your care partner should take you home directly after the procedure. You should plan to take it easy, moving slowly for the rest of the day. You can resume normal activity the day after the procedure however YOU SHOULD NOT DRIVE, use power tools, machinery or perform tasks that involve climbing or major physical exertion for 24 hours (because of the sedation medicines used during the test).   SYMPTOMS TO REPORT IMMEDIATELY: A gastroenterologist can be reached at any hour. Please call [phone]  for any of the following symptoms:  Following lower endoscopy (colonoscopy, flexible sigmoidoscopy) Excessive amounts of blood in the stool  Significant tenderness, worsening of abdominal pains  Swelling of the abdomen that  is new, acute  Fever of 100 or higher   FOLLOW UP:  If any biopsies were taken you will be contacted by phone or by letter within the next 1-3 weeks. Call [phone]  if you have not heard about the biopsies in 3 weeks.  Please also call with any specific questions about appointments or follow up tests.

## 2022-10-21 NOTE — Progress Notes (Signed)
Called to room to assist during endoscopic procedure.  Patient ID and intended procedure confirmed with present staff. Received instructions for my participation in the procedure from the performing physician.  

## 2022-10-22 ENCOUNTER — Telehealth: Payer: Self-pay | Admitting: *Deleted

## 2022-10-22 NOTE — Telephone Encounter (Signed)
Post procedure follow up phone call. No answer at number given.  Left message on voicemail.  

## 2022-10-25 ENCOUNTER — Ambulatory Visit (INDEPENDENT_AMBULATORY_CARE_PROVIDER_SITE_OTHER): Payer: PPO | Admitting: Obstetrics & Gynecology

## 2022-10-25 ENCOUNTER — Encounter (HOSPITAL_BASED_OUTPATIENT_CLINIC_OR_DEPARTMENT_OTHER): Payer: Self-pay | Admitting: Obstetrics & Gynecology

## 2022-10-25 VITALS — BP 127/72 | HR 84 | Ht 64.0 in | Wt 172.6 lb

## 2022-10-25 DIAGNOSIS — Z780 Asymptomatic menopausal state: Secondary | ICD-10-CM

## 2022-10-25 DIAGNOSIS — M8588 Other specified disorders of bone density and structure, other site: Secondary | ICD-10-CM

## 2022-10-25 DIAGNOSIS — R7303 Prediabetes: Secondary | ICD-10-CM | POA: Diagnosis not present

## 2022-10-25 DIAGNOSIS — B009 Herpesviral infection, unspecified: Secondary | ICD-10-CM | POA: Diagnosis not present

## 2022-10-25 DIAGNOSIS — Z9189 Other specified personal risk factors, not elsewhere classified: Secondary | ICD-10-CM | POA: Diagnosis not present

## 2022-10-25 NOTE — Progress Notes (Signed)
72 y.o. [name] Married White or Caucasian female here for breast and pelvic exam.  Denies vaginal bleeding.  Son is doing well.  She reports she is starting to exercise and is doing piliates.  Frustrated with weight.    Discussed prediabetes, cholesterol with pt today.  Coronary calcium score testing discussed.  She is interested in proceeding this year.  Recommended discussing with Dr. Okey Smith.     No LMP recorded. Patient is postmenopausal.          Sexually active: No.  H/O STD:  no Lifetime hx of >5 partners  Health Maintenance: PCP:  Dr. Okey Smith.  Last wellness appt was 10/2021.  Did blood work at that appt: yes Vaccines are up to date:  has not done shingrix vaccinations Colonoscopy:  10/21/2022, two polyps.  Follow up 3 years.   MMG:  04/21/2021 Negative BMD:  11/04/2020 Osteopenia, -2.0 Last pap smear:  10/17/2020 Negative.  H/o abnormal pap smear:  no    reports that she quit smoking about 39 years ago. Her smoking use included cigarettes. She has never used smokeless tobacco. She reports that she does not currently use alcohol. She reports that she does not use drugs.  Past Medical History:  Diagnosis Date   Allergy    Anxiety    Aortic regurgitation    moderate by echo 10/2020   Arthritis    Cancer (HCC)    skin   Cataract    bilateral - MD is just watching    Diverticulosis    Fuchs' endothelial dystrophy    follows with optho regularly    GERD (gastroesophageal reflux disease)    Gestational diabetes    Heart murmur    MVP    History of depression    HSV-2 infection    Hyperlipidemia    no meds - diet controlled   Hyperplastic colon polyp    Mitral valve prolapse    mild to moderate MR by echo 10/2020   PVC's (premature ventricular contractions)    intol of BB, sxc palpitations r/t stress   RVOT ventricular tachycardia/PVCs    EP eval 01/2013 for freq PVCs    Past Surgical History:  Procedure Laterality Date   CESAREAN SECTION     x 3    COLONOSCOPY     KNEE SURGERY Right    MANDIBLE SURGERY     right side in front of ear   MOHS SURGERY  2021   POLYPECTOMY     WISDOM TOOTH EXTRACTION      Current Outpatient Medications  Medication Sig Dispense Refill   ALPRAZolam (XANAX) 0.25 MG tablet Take 1 tablet (0.25 mg total) by mouth daily as needed for anxiety. 90 tablet 1   ascorbic acid (VITAMIN C) 500 MG tablet Take 500 mg by mouth every 3 (three) days. [id number] MG     b complex vitamins capsule Take 1 capsule by mouth every 3 (three) days.     Cholecalciferol (VITAMIN D-3) 125 MCG (5000 UT) TABS Take 1 tablet by mouth every 3 (three) days.     loratadine (CLARITIN) 10 MG tablet Take 10 mg by mouth daily as needed for allergies.     MAGNESIUM GLUCONATE PO Take 400 mg by mouth daily.     rosuvastatin (CRESTOR) 5 MG tablet Take 1 tablet (5 mg total) by mouth daily. 90 tablet 3   Turmeric (QC TUMERIC COMPLEX PO) Take by mouth every 3 (three) days.     propranolol (INDERAL) 10 MG tablet  Take 10 mg by mouth as needed (increased HR and skipped beats). (Patient not taking: Reported on 10/25/2022)     No current facility-administered medications for this visit.    Family History  Problem Relation Age of Onset   Colon cancer Mother        dx'd in her 2's-- stage 2    Heart disease Father    Colon polyps Sister    Colon polyps Sister    Colon cancer Maternal Grandmother    Colon polyps Daughter    Alcohol abuse Other    Rectal cancer Neg Hx    Stomach cancer Neg Hx    Esophageal cancer Neg Hx     Review of Systems  Constitutional: Negative.   Genitourinary: Negative.     Exam:   BP 127/72 (BP Location: Left Arm, Patient Position: Sitting, Cuff Size: Large)   Pulse 84   Ht 5\' 4"  (1.626 m) Comment: Reported  Wt 172 lb 9.6 oz (78.3 kg)   BMI 29.63 kg/m   Height: 5\' 4"  (162.6 cm) (Reported)  General appearance: alert, cooperative and appears stated age Breasts: normal appearance, no masses or tenderness Abdomen:  soft, non-tender; bowel sounds normal; no masses,  no organomegaly Lymph nodes: Cervical, supraclavicular, and axillary nodes normal.  No abnormal inguinal nodes palpated Neurologic: Grossly normal  Pelvic: External genitalia:  no lesions              Urethra:  normal appearing urethra with no masses, tenderness or lesions              Bartholins and Skenes: normal                 Vagina: normal appearing vagina with atrophic changes and no discharge, no lesions              Cervix: no lesions              Pap taken: No. Bimanual Exam:  Uterus:  normal size, contour, position, consistency, mobility, non-tender              Adnexa: normal adnexa and no mass, fullness, tenderness               Rectovaginal: Confirms               Anus:  normal sphincter tone, no lesions  Chaperone, Ina Homes, CMA, was present for exam.  Assessment/Plan: 1. GYN exam for high-risk Medicare patient - Pap smear guidelines reviewed.  Will not obtained today.  Was negative 2022. - Mammogram 04/2021 - Colonoscopy 10/2022 - Bone mineral density 2022.  Will plan to repeat 1-2 years - lab work done with PCP, Dr. Okey Smith - vaccines reviewed/updated  2. Postmenopausal - not on HRT  3. Osteopenia of spine - on Vit D 5000 IU, about 5 times weekly - calcium intake discussed.  She is getting 3-4 servings a day.  4. HSV infection - not on any antiviral therapy  5.  Prediabetes - followed by Dr. Okey Smith

## 2022-10-27 ENCOUNTER — Encounter: Payer: Self-pay | Admitting: Gastroenterology

## 2022-11-01 ENCOUNTER — Encounter (HOSPITAL_BASED_OUTPATIENT_CLINIC_OR_DEPARTMENT_OTHER): Payer: Self-pay | Admitting: Obstetrics & Gynecology

## 2022-11-09 ENCOUNTER — Ambulatory Visit (INDEPENDENT_AMBULATORY_CARE_PROVIDER_SITE_OTHER): Payer: PPO | Admitting: Internal Medicine

## 2022-11-09 ENCOUNTER — Encounter: Payer: Self-pay | Admitting: Internal Medicine

## 2022-11-09 VITALS — BP 118/84 | HR 97 | Temp 98.2°F | Ht 64.0 in | Wt 170.0 lb

## 2022-11-09 DIAGNOSIS — Z0000 Encounter for general adult medical examination without abnormal findings: Secondary | ICD-10-CM | POA: Diagnosis not present

## 2022-11-09 DIAGNOSIS — E782 Mixed hyperlipidemia: Secondary | ICD-10-CM | POA: Diagnosis not present

## 2022-11-09 DIAGNOSIS — I4729 Other ventricular tachycardia: Secondary | ICD-10-CM

## 2022-11-09 DIAGNOSIS — R7303 Prediabetes: Secondary | ICD-10-CM

## 2022-11-09 LAB — COMPREHENSIVE METABOLIC PANEL
ALT: 15 U/L (ref 0–35)
AST: 18 U/L (ref 0–37)
Albumin: 4.4 g/dL (ref 3.5–5.2)
Alkaline Phosphatase: 47 U/L (ref 39–117)
BUN: 17 mg/dL (ref 6–23)
CO2: 29 mEq/L (ref 19–32)
Calcium: 9.9 mg/dL (ref 8.4–10.5)
Chloride: 100 mEq/L (ref 96–112)
Creatinine, Ser: 0.8 mg/dL (ref 0.40–1.20)
GFR: 74.85 mL/min (ref >60.00)
Glucose, Bld: 120 mg/dL — ABNORMAL HIGH (ref 70–99)
Potassium: 4.8 mEq/L (ref 3.5–5.1)
Sodium: 137 mEq/L (ref 135–145)
Total Bilirubin: 0.5 mg/dL (ref 0.2–1.2)
Total Protein: 7.5 g/dL (ref 6.0–8.3)

## 2022-11-09 LAB — CBC
HCT: 42.4 % (ref 36.0–46.0)
Hemoglobin: 14.2 g/dL (ref 12.0–15.0)
MCHC: 33.4 g/dL (ref 30.0–36.0)
MCV: 87 fl (ref 78.0–100.0)
Platelets: 239 10*3/uL (ref 150.0–400.0)
RBC: 4.87 Mil/uL (ref 3.87–5.11)
RDW: 14.5 % (ref 11.5–15.5)
WBC: 5.3 10*3/uL (ref 4.0–10.5)

## 2022-11-09 LAB — LIPID PANEL
Cholesterol: 201 mg/dL — ABNORMAL HIGH (ref 0–200)
HDL: 80 mg/dL (ref >39.00)
LDL Cholesterol: 103 mg/dL — ABNORMAL HIGH (ref 0–99)
NonHDL: 121.29
Total CHOL/HDL Ratio: 3
Triglycerides: 92 mg/dL (ref 0.0–149.0)
VLDL: 18.4 mg/dL (ref 0.0–40.0)

## 2022-11-09 LAB — HEMOGLOBIN A1C: Hgb A1c MFr Bld: 6.5 % (ref 4.6–6.5)

## 2022-11-09 MED ORDER — ROSUVASTATIN CALCIUM 5 MG PO TABS: 5.0000 mg | ORAL_TABLET | Freq: Every day | ORAL | 3 refills | Status: DC

## 2022-11-09 MED ORDER — ALPRAZOLAM 0.25 MG PO TABS: 0.2500 mg | ORAL_TABLET | Freq: Every day | ORAL | 0 refills | Status: DC | PRN

## 2022-11-09 NOTE — Progress Notes (Unsigned)
Subjective:   Patient ID: Jean Smith, female    DOB: 03/14/1951, 72 y.o.   MRN: [id number]  HPI Here for medicare wellness and physical, no new complaints. Please see A/P for status and treatment of chronic medical problems.   Diet: heart healthy Physical activity: sedentary, pilates weekly Depression/mood screen: negative Hearing: intact to whispered voice Visual acuity: grossly normal with reading lens, performs annual eye exam  ADLs: capable Fall risk: none Home safety: good Cognitive evaluation: intact to orientation, naming, recall and repetition EOL planning: adv directives discussed  Flowsheet Row Office Visit from 11/09/2022 in [facility] [place]  PHQ-2 Total Score 0       Flowsheet Row Office Visit from 11/09/2022 in [facility]  PHQ-9 Total Score 0         06/16/2021    1:19 PM 11/05/2021   10:09 AM 01/05/2022    2:35 PM 09/21/2022    9:16 AM 11/09/2022   10:33 AM  Fall Risk  Falls in the past year? 0 0 0 0 0  Was there an injury with Fall? 0 0 0 0 0  Fall Risk Category Calculator 0 0 0 0 0  Fall Risk Category (Retired) Low Low Low    (RETIRED) Patient Fall Risk Level Low fall risk      Patient at Risk for Falls Due to No Fall Risks      Fall risk Follow up    Falls evaluation completed Falls evaluation completed    I have personally reviewed and have noted 1. The patient's medical and social history - reviewed today no changes 2. Their use of alcohol, tobacco or illicit drugs 3. Their current medications and supplements 4. The patient's functional ability including ADL's, fall risks, home safety risks and hearing or visual impairment. 5. Diet and physical activities 6. Evidence for depression or mood disorders 7. Care team reviewed and updated 8.  The patient is not on an opioid pain medication. Patient Care Team: Myrlene Broker, MD as PCP - General (Internal Medicine) Quintella Reichert, MD as PCP - Cardiology (Cardiology) Lanier Prude, MD as PCP - Electrophysiology (Cardiology) Duke Salvia, MD (Cardiology) Nahser, Deloris Ping, MD (Cardiology) Hart Carwin, MD (Inactive) (Gastroenterology) Noland Fordyce, MD (Obstetrics and Gynecology) Mateo Flow, MD (Ophthalmology) Past Medical History:  Diagnosis Date   Allergy    Anxiety    Aortic regurgitation    moderate by echo 10/2020   Arthritis    Cancer [facility])    skin   Cataract    bilateral - MD is just watching    Diverticulosis    Fuchs' endothelial dystrophy    follows with optho regularly    Gestational diabetes    Heart murmur    MVP    History of depression    HSV-2 infection    Hyperlipidemia    Hyperplastic colon polyp    Mitral valve prolapse    mild to moderate MR by echo 10/2020   PVC's (premature ventricular contractions)    intol of BB, sxc palpitations r/t stress   RVOT ventricular tachycardia/PVCs    EP eval 01/2013 for freq PVCs   Past Surgical History:  Procedure Laterality Date   CESAREAN SECTION     x 3   COLONOSCOPY     KNEE SURGERY Right    MANDIBLE SURGERY     right side in front of ear   MOHS SURGERY  2021   POLYPECTOMY     WISDOM TOOTH EXTRACTION     Family History  Problem Relation Age of Onset   Colon cancer Mother        dx'd in her 89's-- stage 2    Heart disease Father    Colon polyps Sister    Colon polyps Sister    Colon cancer Maternal Grandmother    Colon polyps Daughter    Alcohol abuse Other    Rectal cancer Neg Hx    Stomach cancer Neg Hx    Esophageal cancer Neg Hx    Review of Systems  Constitutional: Negative.   HENT: Negative.    Eyes: Negative.   Respiratory:  Negative for cough, chest tightness and shortness of breath.   Cardiovascular:  Negative for chest pain, palpitations and leg swelling.  Gastrointestinal:  Negative for abdominal distention, abdominal pain, constipation, diarrhea, nausea and vomiting.  Musculoskeletal: Negative.    Skin: Negative.   Neurological: Negative.   Psychiatric/Behavioral: Negative.      Objective:  Physical Exam Constitutional:      Appearance: She is well-developed.  HENT:     Head: Normocephalic and atraumatic.  Cardiovascular:     Rate and Rhythm: Normal rate and regular rhythm.  Pulmonary:     Effort: Pulmonary effort is normal. No respiratory distress.     Breath sounds: Normal breath sounds. No wheezing or rales.  Abdominal:     General: Bowel sounds are normal. There is no distension.     Palpations: Abdomen is soft.     Tenderness: There is no abdominal tenderness. There is no rebound.  Musculoskeletal:     Cervical back: Normal range of motion.  Skin:    General: Skin is warm and dry.  Neurological:     Mental Status: She is alert and oriented to person, place, and time.     Coordination: Coordination normal.     Vitals:   11/09/22 1030  BP: 118/84  Pulse: 97  Temp: 98.2 F (36.8 C)  TempSrc: Oral  SpO2: 96%  Weight: 170 lb (77.1 kg)  Height: 5\' 4"  (1.626 m)    Assessment & Plan:

## 2022-11-10 NOTE — Assessment & Plan Note (Signed)
Flu shot yearly. Pneumonia complete. Shingrix due at pharmacy. Tetanus due 2026. Colonoscopy due 2027. Mammogram due 2024, pap smear aged out and dexa complete. Counseled about sun safety and mole surveillance. Counseled about the dangers of distracted driving. Given 10 year screening recommendations.

## 2022-11-10 NOTE — Assessment & Plan Note (Signed)
Checking HgA1c and adjust as needed. Diet is not great lately she has had a lot of stress and unsure if she would be able to make changes right now.

## 2022-11-10 NOTE — Assessment & Plan Note (Signed)
Checking lipid panel and adjust crestor 5 mg daily as needed. Ordered calcium score (she did not want contrast study cardiology ordered).

## 2022-11-10 NOTE — Assessment & Plan Note (Signed)
No recent episodes and using propranolol prn.

## 2022-11-11 ENCOUNTER — Telehealth: Payer: Self-pay | Admitting: Internal Medicine

## 2022-11-11 NOTE — Telephone Encounter (Signed)
Patient returned call about lab results. She would like a call back at [phone].

## 2022-11-11 NOTE — Telephone Encounter (Signed)
Called pt no answer LMOM RTC.../lmb 

## 2022-11-30 ENCOUNTER — Ambulatory Visit (HOSPITAL_BASED_OUTPATIENT_CLINIC_OR_DEPARTMENT_OTHER)
Admission: RE | Admit: 2022-11-30 | Discharge: 2022-11-30 | Disposition: A | Discharge status: Home / Self Care | Payer: PPO | Source: Ambulatory Visit | Attending: Internal Medicine | Admitting: Internal Medicine

## 2022-11-30 DIAGNOSIS — E782 Mixed hyperlipidemia: Secondary | ICD-10-CM | POA: Insufficient documentation

## 2022-12-01 ENCOUNTER — Encounter: Payer: Self-pay | Admitting: Internal Medicine

## 2023-01-18 ENCOUNTER — Ambulatory Visit: Payer: PPO | Admitting: Family Medicine

## 2023-01-31 DIAGNOSIS — Z85828 Personal history of other malignant neoplasm of skin: Secondary | ICD-10-CM | POA: Diagnosis not present

## 2023-01-31 DIAGNOSIS — D2261 Melanocytic nevi of right upper limb, including shoulder: Secondary | ICD-10-CM | POA: Diagnosis not present

## 2023-01-31 DIAGNOSIS — L570 Actinic keratosis: Secondary | ICD-10-CM | POA: Diagnosis not present

## 2023-01-31 DIAGNOSIS — L820 Inflamed seborrheic keratosis: Secondary | ICD-10-CM | POA: Diagnosis not present

## 2023-02-17 ENCOUNTER — Ambulatory Visit: Payer: PPO | Admitting: Family Medicine

## 2023-02-27 ENCOUNTER — Other Ambulatory Visit: Payer: Self-pay | Admitting: Internal Medicine

## 2023-02-28 ENCOUNTER — Telehealth: Payer: Self-pay | Admitting: Internal Medicine

## 2023-02-28 NOTE — Telephone Encounter (Signed)
Pt is calling to speak with her nurse about a glucose monitor. If someone can give her a call back with advice on that at you earliest convenience.

## 2023-03-02 NOTE — Telephone Encounter (Signed)
Pt states that she would like to have a glucose monitor

## 2023-03-03 NOTE — Telephone Encounter (Signed)
I don't see upcoming apt in line with recommended follow up after last labs of 3-6 months (August to November). Make sure appropriate care scheduled okay for meter daily prn testing.

## 2023-03-08 NOTE — Telephone Encounter (Signed)
Pt has been scheduled.  °

## 2023-03-09 ENCOUNTER — Encounter: Payer: Self-pay | Admitting: Pharmacist

## 2023-03-09 NOTE — Progress Notes (Signed)
Pharmacy Quality Measure Review  This patient is appearing on a report for being at risk of failing the adherence measure for cholesterol (statin) medications this calendar year.   Medication: rosuvastatin 5 mg daily Last fill date: 02/27/2023 for 90 day supply  Insurance report was not up to date. No action needed at this time.   Arbutus Leas, PharmD, BCPS [facility] [phone]

## 2023-03-17 ENCOUNTER — Ambulatory Visit: Payer: PPO | Admitting: Family Medicine

## 2023-03-24 ENCOUNTER — Encounter: Payer: Self-pay | Admitting: Internal Medicine

## 2023-03-24 ENCOUNTER — Ambulatory Visit (INDEPENDENT_AMBULATORY_CARE_PROVIDER_SITE_OTHER): Payer: PPO | Admitting: Internal Medicine

## 2023-03-24 VITALS — BP 112/80 | HR 86 | Temp 97.6°F | Ht 64.0 in | Wt 173.0 lb

## 2023-03-24 DIAGNOSIS — E1169 Type 2 diabetes mellitus with other specified complication: Secondary | ICD-10-CM

## 2023-03-24 DIAGNOSIS — E785 Hyperlipidemia, unspecified: Secondary | ICD-10-CM

## 2023-03-24 DIAGNOSIS — E118 Type 2 diabetes mellitus with unspecified complications: Secondary | ICD-10-CM

## 2023-03-24 LAB — HEMOGLOBIN A1C: Hgb A1c MFr Bld: 6.5 % (ref 4.6–6.5)

## 2023-03-24 LAB — MICROALBUMIN / CREATININE URINE RATIO
Creatinine,U: 15.8 mg/dL
Microalb Creat Ratio: 4.4 mg/g (ref 0.0–30.0)
Microalb, Ur: <0.7 mg/dL (ref 0.0–1.9)

## 2023-03-24 NOTE — Patient Instructions (Signed)
We will check the labs today. You do have new diabetes.   Let us know if you change your mind about talking to the nutritionist.

## 2023-03-24 NOTE — Progress Notes (Signed)
   Subjective:   Patient ID: Jean Smith, female    DOB: 1951/04/02, 72 y.o.   MRN: [id number]  HPI The patient is a 72 YO female coming in for newly diagnosed diabetes. Eye doctor Elmer Picker has not gone this year yet. Many other concerns as well see A/P  Review of Systems  Constitutional:  Positive for fatigue and unexpected weight change.  HENT: Negative.    Eyes: Negative.   Respiratory:  Negative for cough, chest tightness and shortness of breath.   Cardiovascular:  Negative for chest pain, palpitations and leg swelling.  Gastrointestinal:  Negative for abdominal distention, abdominal pain, constipation, diarrhea, nausea and vomiting.  Musculoskeletal: Negative.   Skin: Negative.   Neurological: Negative.   Psychiatric/Behavioral:  Positive for sleep disturbance.     Objective:  Physical Exam Constitutional:      Appearance: She is well-developed.  HENT:     Head: Normocephalic and atraumatic.  Cardiovascular:     Rate and Rhythm: Normal rate and regular rhythm.  Pulmonary:     Effort: Pulmonary effort is normal. No respiratory distress.     Breath sounds: Normal breath sounds. No wheezing or rales.  Abdominal:     General: Bowel sounds are normal. There is no distension.     Palpations: Abdomen is soft.     Tenderness: There is no abdominal tenderness. There is no rebound.  Musculoskeletal:     Cervical back: Normal range of motion.  Skin:    General: Skin is warm and dry.  Neurological:     Mental Status: She is alert and oriented to person, place, and time.     Coordination: Coordination normal.     Vitals:   03/24/23 0938  BP: 112/80  Pulse: 86  Temp: 97.6 F (36.4 C)  TempSrc: Oral  SpO2: 97%  Weight: 173 lb (78.5 kg)  Height: 5\' 4"  (1.626 m)    Assessment & Plan:  Visit time 25 minutes in face to face communication with patient and coordination of care, additional 5 minutes spent in record review, coordination or care, ordering tests,  communicating/referring to other healthcare professionals, documenting in medical records all on the same day of the visit for total time 30 minutes spent on the visit.

## 2023-03-24 NOTE — Assessment & Plan Note (Signed)
Prior LDL not at goal but markedly improved. Taking crestor 5 mg daily

## 2023-03-24 NOTE — Assessment & Plan Note (Signed)
Counseled about etiology. Checking HGA1c, microalbumin to creatinine ratio. Advised we should consider metformin. Diet with room for improvements. She is asking about CGM but counseled she should try to improve diet first and she may not really benefit from cgm now as she understands that working to reduce sugary foods will help (she does eat chocolate). On statin.

## 2023-03-30 NOTE — Progress Notes (Unsigned)
[facility] [address] [place] [address] Phone: [phone] Subjective:   [name], am serving as a scribe for Dr. Antoine Primas.  I'm seeing this patient by the request  of:  Myrlene Broker, MD  CC: Right knee pain  [id number]  10/18/2022 Has had varicose vein but does not appear to need any intervention  Discussed with patient again at great length.  Discussed different treatment options.  Patient was interested in the possibility of PRP after discussing viscosupplementation as well as corticosteroid injections as well.  Discussed which activities to do and which ones to avoid.  Discussed the importance of stability of the knee.  Discussed icing regimen.  Will follow-up again in 6 to 8 weeks.  Total time with patient today 60   Updated 03/31/2023 Jean Smith is a 72 y.o. female coming in with complaint of R knee pain over lateral aspect. Patient states that she is working on strength of her lower legs at the gym. Pain has increased with going up and down stairs. Would like to do PT at [facility]. Motivated to start working out more due to recent prediabetes diagnosis.        Past Medical History:  Diagnosis Date   Allergy    Anxiety    Aortic regurgitation    moderate by echo 10/2020   Arthritis    Cancer (HCC)    skin   Cataract    bilateral - MD is just watching    Diverticulosis    Fuchs' endothelial dystrophy    follows with optho regularly    Gestational diabetes    Heart murmur    MVP    History of depression    HSV-2 infection    Hyperlipidemia    Hyperplastic colon polyp    Mitral valve prolapse    mild to moderate MR by echo 10/2020   PVC's (premature ventricular contractions)    intol of BB, sxc palpitations r/t stress   RVOT ventricular tachycardia/PVCs    EP eval 01/2013 for freq PVCs   Past Surgical History:  Procedure Laterality Date   CESAREAN SECTION     x 3   COLONOSCOPY     KNEE  SURGERY Right    MANDIBLE SURGERY     right side in front of ear   MOHS SURGERY  2021   POLYPECTOMY     WISDOM TOOTH EXTRACTION     Social History   Socioeconomic History   Marital status: Married    Spouse name: Not on file   Number of children: 3   Years of education: Not on file   Highest education level: Some college, no degree  Occupational History   Not on file  Tobacco Use   Smoking status: Former    Current packs/day: 0.00    Types: Cigarettes    Quit date: 06/15/1983    Years since quitting: 39.8   Smokeless tobacco: Never  Vaping Use   Vaping status: Never Used  Substance and Sexual Activity   Alcohol use: Not Currently    Comment: occasional   Drug use: No   Sexual activity: Not Currently    Birth control/protection: Post-menopausal  Other Topics Concern   Not on file  Social History Narrative   Regular exercise: yes   Caffeine use: none   Social Determinants of Health   Financial Resource Strain: Low Risk  (03/23/2023)   Overall Financial Resource Strain (CARDIA)    Difficulty  of Paying Living Expenses: Not hard at all  Food Insecurity: No Food Insecurity (03/23/2023)   Hunger Vital Sign    Worried About Running Out of Food in the Last Year: Never true    Ran Out of Food in the Last Year: Never true  Transportation Needs: No Transportation Needs (03/23/2023)   PRAPARE - Administrator, Civil Service (Medical): No    Lack of Transportation (Non-Medical): No  Physical Activity: Unknown (03/23/2023)   Exercise Vital Sign    Days of Exercise per Week: Patient declined    Minutes of Exercise per Session: Not on file  Stress: Stress Concern Present (03/23/2023)   [employer] of Occupational Health - Occupational Stress Questionnaire    Feeling of Stress : To some extent  Social Connections: Socially Integrated (03/23/2023)   Social Connection and Isolation Panel [NHANES]    Frequency of Communication with Friends and Family: Three times a  week    Frequency of Social Gatherings with Friends and Family: Patient declined    Attends Religious Services: 1 to 4 times per year    Active Member of [employer] or Organizations: Yes    Attends Banker Meetings: 1 to 4 times per year    Marital Status: Married   Allergies  Allergen Reactions   Latex Swelling   Other     Grass, local trees, cats and dogs   Pollen Extract    Family History  Problem Relation Age of Onset   Colon cancer Mother        dx'd in her 59's-- stage 2    Heart disease Father    Colon polyps Sister    Colon polyps Sister    Colon cancer Maternal Grandmother    Colon polyps Daughter    Alcohol abuse Other    Rectal cancer Neg Hx    Stomach cancer Neg Hx    Esophageal cancer Neg Hx      Current Outpatient Medications (Cardiovascular):    propranolol (INDERAL) 10 MG tablet, Take 10 mg by mouth as needed (increased HR and skipped beats).   rosuvastatin (CRESTOR) 5 MG tablet, Take 1 tablet (5 mg total) by mouth daily.  Current Outpatient Medications (Respiratory):    loratadine (CLARITIN) 10 MG tablet, Take 10 mg by mouth daily as needed for allergies.    Current Outpatient Medications (Other):    ALPRAZolam (XANAX) 0.25 MG tablet, TAKE 1 TABLET BY MOUTH DAILY AS NEEDED FOR ANXIETY   ascorbic acid (VITAMIN C) 500 MG tablet, Take 500 mg by mouth every 3 (three) days. [phone] MG   b complex vitamins capsule, Take 1 capsule by mouth every 3 (three) days.   Cholecalciferol (VITAMIN D-3) 125 MCG (5000 UT) TABS, Take 1 tablet by mouth every 3 (three) days.   MAGNESIUM GLUCONATE PO, Take 400 mg by mouth daily.   Turmeric (QC TUMERIC COMPLEX PO), Take by mouth every 3 (three) days.   Reviewed prior external information including notes and imaging from  primary care provider As well as notes that were available from care everywhere and other healthcare systems.  Past medical history, social, surgical and family history all reviewed in electronic  medical record.  No pertanent information unless stated regarding to the chief complaint.   Review of Systems:  No headache, visual changes, nausea, vomiting, diarrhea, constipation, dizziness, abdominal pain, skin rash, fevers, chills, night sweats, weight loss, swollen lymph nodes, body aches, joint swelling, chest pain, shortness of breath, mood changes. POSITIVE  muscle aches  Objective  Blood pressure 138/82, pulse (!) 106, height 5\' 4"  (1.626 m), weight 175 lb (79.4 kg), SpO2 97%.   General: No apparent distress alert and oriented x3 mood and affect normal, dressed appropriately.  HEENT: Pupils equal, extraocular movements intact  Respiratory: Patient's speak in full sentences and does not appear short of breath  Cardiovascular: No lower extremity edema, non tender, no erythema  Right knee does show the patient is tender to palpation over the lateral joint line in the patellofemoral area.  Does have some instability with valgus and varus force.  Is ambulatory.  Antalgic gait noted.    Impression and Recommendations:     The above documentation has been reviewed and is accurate and complete Judi Saa, DO

## 2023-03-31 ENCOUNTER — Ambulatory Visit: Payer: PPO | Admitting: Family Medicine

## 2023-03-31 ENCOUNTER — Encounter: Payer: Self-pay | Admitting: Family Medicine

## 2023-03-31 VITALS — BP 138/82 | HR 106 | Ht 64.0 in | Wt 175.0 lb

## 2023-03-31 DIAGNOSIS — E1369 Other specified diabetes mellitus with other specified complication: Secondary | ICD-10-CM | POA: Diagnosis not present

## 2023-03-31 DIAGNOSIS — G8929 Other chronic pain: Secondary | ICD-10-CM

## 2023-03-31 DIAGNOSIS — R739 Hyperglycemia, unspecified: Secondary | ICD-10-CM

## 2023-03-31 DIAGNOSIS — M1711 Unilateral primary osteoarthritis, right knee: Secondary | ICD-10-CM | POA: Diagnosis not present

## 2023-03-31 DIAGNOSIS — M25561 Pain in right knee: Secondary | ICD-10-CM

## 2023-03-31 DIAGNOSIS — E118 Type 2 diabetes mellitus with unspecified complications: Secondary | ICD-10-CM

## 2023-03-31 NOTE — Patient Instructions (Signed)
Good to see you On referral to endocrinology for hyperglycemia and diabetes Will get you a stability brace with a lateral unloader for the right knee only initially and can look at the left knee later Will get you into physical therapy as well which I think will be beneficial Follow-up with me again in 2 to 3 months

## 2023-03-31 NOTE — Assessment & Plan Note (Signed)
Patient does have some instability with valgus and varus force.  Seems to be more arthritic changes noted bilateral OA stability brace helpful.  Does have abnormal thigh to calf ratio, presumably for a.  Discussed with patient antibiotics potential other things such as icing regimen and home exercises.  Discussed which activities to do and which ones to avoid.  Increase activity slowly.  Follow-up with me again in 6 to 8 weeks otherwise.  Worsening pain will consider the injections.  Will start with physical therapy today secondary to some of the discomfort as well.

## 2023-03-31 NOTE — Assessment & Plan Note (Signed)
Patient has talked to an endocrinologist before.  Would like to see if she could return for this.  Patient will continue to work on increasing activity.

## 2023-04-06 ENCOUNTER — Other Ambulatory Visit: Payer: Self-pay

## 2023-04-06 ENCOUNTER — Ambulatory Visit: Payer: PPO | Attending: Family Medicine

## 2023-04-06 DIAGNOSIS — R262 Difficulty in walking, not elsewhere classified: Secondary | ICD-10-CM | POA: Diagnosis not present

## 2023-04-06 DIAGNOSIS — M6281 Muscle weakness (generalized): Secondary | ICD-10-CM | POA: Diagnosis not present

## 2023-04-06 DIAGNOSIS — R252 Cramp and spasm: Secondary | ICD-10-CM | POA: Diagnosis not present

## 2023-04-06 DIAGNOSIS — G8929 Other chronic pain: Secondary | ICD-10-CM | POA: Insufficient documentation

## 2023-04-06 DIAGNOSIS — M25661 Stiffness of right knee, not elsewhere classified: Secondary | ICD-10-CM | POA: Insufficient documentation

## 2023-04-06 DIAGNOSIS — M25561 Pain in right knee: Secondary | ICD-10-CM | POA: Insufficient documentation

## 2023-04-07 NOTE — Therapy (Signed)
OUTPATIENT PHYSICAL THERAPY LOWER EXTREMITY EVALUATION   Patient Name: Jean Smith MRN: [id number] DOB:02-02-1951, 72 y.o., female Today's Date: 04/07/2023  END OF SESSION:  PT End of Session - 04/06/23 1642     Visit Number 1    Date for PT Re-Evaluation 06/01/23    Authorization Type [employer]    Progress Note Due on Visit 10    PT Start Time 1615    PT Stop Time 1700    PT Time Calculation (min) 45 min    Activity Tolerance Patient tolerated treatment well    Behavior During Therapy [facility] for tasks assessed/performed             Past Medical History:  Diagnosis Date   Allergy    Anxiety    Aortic regurgitation    moderate by echo 10/2020   Arthritis    Cancer (HCC)    skin   Cataract    bilateral - MD is just watching    Diverticulosis    Fuchs' endothelial dystrophy    follows with optho regularly    Gestational diabetes    Heart murmur    MVP    History of depression    HSV-2 infection    Hyperlipidemia    Hyperplastic colon polyp    Mitral valve prolapse    mild to moderate MR by echo 10/2020   PVC's (premature ventricular contractions)    intol of BB, sxc palpitations r/t stress   RVOT ventricular tachycardia/PVCs    EP eval 01/2013 for freq PVCs   Past Surgical History:  Procedure Laterality Date   CESAREAN SECTION     x 3   COLONOSCOPY     KNEE SURGERY Right    MANDIBLE SURGERY     right side in front of ear   MOHS SURGERY  2021   POLYPECTOMY     WISDOM TOOTH EXTRACTION     Patient Active Problem List   Diagnosis Date Noted   AC (acromioclavicular) arthritis 05/28/2022   Mitral valve prolapse    Aortic regurgitation    NSVT (nonsustained ventricular tachycardia) (HCC) 09/25/2020   Diabetes mellitus type 2 with complications (HCC) 08/27/2020   Chronic pain of both shoulders 05/05/2018   Other fatigue 01/02/2018   Allergic rhinitis 01/02/2018   Routine general medical examination at a health care facility 09/01/2015    Hyperlipidemia associated with type 2 diabetes mellitus (HCC) 09/01/2015   Varicose vein 08/27/2014   Primary localized osteoarthrosis, lower leg 10/30/2013   RVOT ventricular tachycardia/PVCs 01/31/2013   Abnormal stress echo 12/11/2012    PCP: Myrlene Broker, MD  REFERRING PROVIDER: Judi Saa, DO  REFERRING DIAG: [phone] (ICD-10-CM) - Chronic pain of right knee  THERAPY DIAG:  Chronic pain of right knee - Plan: PT plan of care cert/re-cert  Stiffness of right knee, not elsewhere classified - Plan: PT plan of care cert/re-cert  Muscle weakness (generalized) - Plan: PT plan of care cert/re-cert  Cramp and spasm - Plan: PT plan of care cert/re-cert  Difficulty in walking, not elsewhere classified - Plan: PT plan of care cert/re-cert  Rationale for Evaluation and Treatment: Rehabilitation  ONSET DATE: 03/31/2023  SUBJECTIVE:   SUBJECTIVE STATEMENT: Patient reports long hx of right knee pain but is also having pain in the left knee.  She had knee scope many years ago and describes a cyst removal from the anteromedial aspect of the right knee which has resurfaced.  She is now having increased right  knee pain and the right knee is giving way as well as having left knee pain.  Provider has ordered, what sounds like, a Don-Joy knee brace for her.  She has pain with start up and prolonged standing or walking.  She is having difficulty with ADL's and IADL's.  She is unable to rise from her toilet without pulling up on bathroom fixtures.    PERTINENT HISTORY: Knee scope approx 15 years ago (right knee) PAIN:  Are you having pain? Yes: NPRS scale: 5/10 Pain location: right > left Pain description: aching, sharp Aggravating factors: walking, standing, bending,stooping, squatting Relieving factors: rest, meds  PRECAUTIONS: Fall  RED FLAGS: None   WEIGHT BEARING RESTRICTIONS: No  FALLS:  Has patient fallen in last 6 months? No  LIVING ENVIRONMENT: Lives  with: lives with their spouse Lives in: House/apartment Stairs: Yes: Internal: 12 steps; on right going up and External: 5 steps; on right going up Has following equipment at home: None  OCCUPATION: retired  PLOF: Independent, Independent with basic ADLs, Independent with household mobility without device, Independent with community mobility without device, Independent with homemaking with ambulation, Independent with gait, and Independent with transfers  PATIENT GOALS: to be able to walk and get up and down from a chair and do her routine daily activities without pain and without her knee giving way  NEXT MD VISIT: prn  OBJECTIVE:  Note: Objective measures were completed at Evaluation unless otherwise noted.  DIAGNOSTIC FINDINGS:  05/30/22: RIGHT KNEE 3 VIEWS  COMPARISON:  Bilateral knee radiographs May 19th, 2015  FINDINGS: No acute fracture or dislocation. No joint effusion. Mild lateral compartment subchondral sclerosis and cystic changes with mild osteophytosis. No soft tissue abnormalities.  IMPRESSION: Mild lateral compartment osteoarthritis.  07/28/22 Left knee: Resulted by: Judi Saa, DO Performed: 07/28/22 1411 - 07/28/22 1411  Accession number: [id number] Resulting lab: [facility]  Narrative: Limited muscular skeletal ultrasound was performed and interpreted by Antoine Primas, M Limited ultrasound continues to show the patient does have some hypoechoic changes in the patellofemoral joint noted.  Some degenerative changes of the meniscus noted. Impression: Improvement in inflammation but still continue effusion    PATIENT SURVEYS:  Eval: FOTO 38, predicted 55  COGNITION: Overall cognitive status: Within functional limits for tasks assessed     SENSATION: WFL  EDEMA:  Figure 8: measure next visit  POSTURE:  slight knee valgus bilaterally  PALPATION: Mod crepitus noted on seated open chain flexion/extension  LOWER EXTREMITY  ROM:  WNL LOWER EXTREMITY MMT:  Generally 4/5 with exception of bilateral knee flexion 4-/5 and bilateral hip abd 4-/5 and bilateral hip ER 3+/5  LOWER EXTREMITY SPECIAL TESTS:  Knee special tests: Patellafemoral grind test: positive   FUNCTIONAL TESTS:  5 times sit to stand: 24 sec Timed up and go (TUG): 14.5 sec  GAIT: Distance walked: 50 feet Assistive device utilized: None Level of assistance: Complete Independence Comments: antalgic, slow, right LE externally rotated, guarded, short step length   TODAY'S TREATMENT:  DATE: 04/06/23 Initial eval completed and initiated HEP    PATIENT EDUCATION:  Education details: Initiated HEP Person educated: Patient Education method: Programmer, multimedia, Facilities manager, Verbal cues, and Handouts Education comprehension: verbalized understanding, returned demonstration, and verbal cues required  HOME EXERCISE PROGRAM: Access Code: [email or web address] URL: [email or web address] Date: 04/07/2023 Prepared by: Mikey Kirschner  Exercises - Long Sitting Quad Set  - 1 x daily - 7 x weekly - 3 sets - 10 reps - Supine Knee Extension Strengthening  - 1 x daily - 7 x weekly - 3 sets - 10 reps - Seated Long Arc Quad  - 1 x daily - 7 x weekly - 3 sets - 10 reps  ASSESSMENT:  CLINICAL IMPRESSION: Patient is a 72 y.o. female who was seen today for physical therapy evaluation and treatment for bilateral knee pain R > L.  She presents with moderate crepitus, pain with activity and positive theatre sign, instability episodes, generalized weakness bil LE's and pain.  She would likely benefit from skilled PT for quad rehab, hip strengthening and alignment training, balance training and pain control.     OBJECTIVE IMPAIRMENTS: Abnormal gait, decreased balance, decreased knowledge of use of DME, decreased mobility, difficulty walking,  decreased strength, increased edema, increased fascial restrictions, increased muscle spasms, impaired flexibility, postural dysfunction, obesity, and pain.   ACTIVITY LIMITATIONS: lifting, bending, sitting, standing, squatting, sleeping, stairs, transfers, bed mobility, bathing, toileting, dressing, and caring for others  PARTICIPATION LIMITATIONS: meal prep, cleaning, laundry, driving, shopping, community activity, yard work, and church  PERSONAL FACTORS: Fitness, Time since onset of injury/illness/exacerbation, and 1-2 comorbidities: anxiety and obesity  are also affecting patient's functional outcome.   REHAB POTENTIAL: Fair due to possible mechanical meniscus injury or joint deterioration  CLINICAL DECISION MAKING: Evolving/moderate complexity  EVALUATION COMPLEXITY: Moderate   GOALS: Goals reviewed with patient? Yes  SHORT TERM GOALS: Target date: 05/04/2023   Patient will be independent with initial HEP  Baseline: Goal status: INITIAL  2.  Pain report to be no greater than 4/10  Baseline:  Goal status: INITIAL   LONG TERM GOALS: Target date: 06/01/2023   Patient to be independent with advanced HEP  Baseline:  Goal status: INITIAL  2.  Patient to report pain no greater than 2/10  Baseline:  Goal status: INITIAL  3.  Patient to be able to bend, stoop and squat with pain no greater than 2/10  Baseline:  Goal status: INITIAL  4.  Patient to be able to ascend and descend steps without pain or no greater than 2/10  Baseline:  Goal status: INITIAL  5.  Patient to be able to sleep through the night  Baseline:  Goal status: INITIAL  6.  Patient to report 85% improvement in overall symptoms  Baseline:  Goal status: INITIAL   PLAN:  PT FREQUENCY: 1-2x/week  PT DURATION: 8 weeks  PLANNED INTERVENTIONS: 97110-Therapeutic exercises, 97530- Therapeutic activity, [id number]- Neuromuscular re-education, 97535- Self Care, [id number]- Manual therapy, [id number]- Gait training,  [phone]- Aquatic Therapy, [phone]- Splinting, 97014- Electrical stimulation (unattended), [id number]- Electrical stimulation (manual), 97016- Vasopneumatic device, [id number]- Ultrasound, [id number]- Ionotophoresis 4mg /ml Dexamethasone, Patient/Family education, Balance training, Stair training, Taping, Dry Needling, Joint mobilization, Scar mobilization, Compression bandaging, Vestibular training, Visual/preceptual remediation/compensation, Cryotherapy, and Moist heat  PLAN FOR NEXT SESSION: Review HEP, Nustep, progress quad rehab, postural training.   Victorino Dike B. Megon Kalina, PT 04/07/23 9:21 AM [facility] [address], [address] [place], [place] [address] Phone # [phone] Fax [phone]

## 2023-04-12 ENCOUNTER — Ambulatory Visit: Payer: PPO | Admitting: Physical Therapy

## 2023-04-12 DIAGNOSIS — M25561 Pain in right knee: Secondary | ICD-10-CM | POA: Diagnosis not present

## 2023-04-12 DIAGNOSIS — M6281 Muscle weakness (generalized): Secondary | ICD-10-CM

## 2023-04-12 DIAGNOSIS — M25661 Stiffness of right knee, not elsewhere classified: Secondary | ICD-10-CM

## 2023-04-12 DIAGNOSIS — G8929 Other chronic pain: Secondary | ICD-10-CM

## 2023-04-12 NOTE — Therapy (Signed)
OUTPATIENT PHYSICAL THERAPY LOWER EXTREMITY PROGRESS NOTE   Patient Name: Jean Smith MRN: [id number] DOB:June 12, 1951, 72 y.o., female Today's Date: 04/12/2023  END OF SESSION:  PT End of Session - 04/12/23 0934     Visit Number 2    Date for PT Re-Evaluation 06/01/23    Authorization Type [employer]    Progress Note Due on Visit 10    PT Start Time 0934    PT Stop Time 1015    PT Time Calculation (min) 41 min    Activity Tolerance Patient tolerated treatment well             Past Medical History:  Diagnosis Date   Allergy    Anxiety    Aortic regurgitation    moderate by echo 10/2020   Arthritis    Cancer (HCC)    skin   Cataract    bilateral - MD is just watching    Diverticulosis    Fuchs' endothelial dystrophy    follows with optho regularly    Gestational diabetes    Heart murmur    MVP    History of depression    HSV-2 infection    Hyperlipidemia    Hyperplastic colon polyp    Mitral valve prolapse    mild to moderate MR by echo 10/2020   PVC's (premature ventricular contractions)    intol of BB, sxc palpitations r/t stress   RVOT ventricular tachycardia/PVCs    EP eval 01/2013 for freq PVCs   Past Surgical History:  Procedure Laterality Date   CESAREAN SECTION     x 3   COLONOSCOPY     KNEE SURGERY Right    MANDIBLE SURGERY     right side in front of ear   MOHS SURGERY  2021   POLYPECTOMY     WISDOM TOOTH EXTRACTION     Patient Active Problem List   Diagnosis Date Noted   AC (acromioclavicular) arthritis 05/28/2022   Mitral valve prolapse    Aortic regurgitation    NSVT (nonsustained ventricular tachycardia) (HCC) 09/25/2020   Diabetes mellitus type 2 with complications (HCC) 08/27/2020   Chronic pain of both shoulders 05/05/2018   Other fatigue 01/02/2018   Allergic rhinitis 01/02/2018   Routine general medical examination at a health care facility 09/01/2015   Hyperlipidemia associated with type 2 diabetes mellitus  (HCC) 09/01/2015   Varicose vein 08/27/2014   Primary localized osteoarthrosis, lower leg 10/30/2013   RVOT ventricular tachycardia/PVCs 01/31/2013   Abnormal stress echo 12/11/2012    PCP: Myrlene Broker, MD  REFERRING PROVIDER: Judi Saa, DO  REFERRING DIAG: [phone] (ICD-10-CM) - Chronic pain of right knee  THERAPY DIAG:  Chronic pain of right knee  Stiffness of right knee, not elsewhere classified  Muscle weakness (generalized)  Rationale for Evaluation and Treatment: Rehabilitation  ONSET DATE: 03/31/2023  SUBJECTIVE:   SUBJECTIVE STATEMENT: I usually wear a knee brace with an opening for the kneecap but I'm not wearing it today.     Has a stationary bike in the garage.  PERTINENT HISTORY: Knee scope approx 15 years ago (right knee) PAIN:  Are you having pain? Yes: NPRS scale: 5/10 Pain location: right > left Pain description: aching, sharp Aggravating factors: walking, standing, bending,stooping, squatting Relieving factors: rest, meds  PRECAUTIONS: Fall  RED FLAGS: None   WEIGHT BEARING RESTRICTIONS: No  FALLS:  Has patient fallen in last 6 months? No  LIVING ENVIRONMENT: Lives with: lives with their spouse Lives  in: House/apartment Stairs: Yes: Internal: 12 steps; on right going up and External: 5 steps; on right going up Has following equipment at home: None  OCCUPATION: retired  PLOF: Independent, Independent with basic ADLs, Independent with household mobility without device, Independent with community mobility without device, Independent with homemaking with ambulation, Independent with gait, and Independent with transfers  PATIENT GOALS: to be able to walk and get up and down from a chair and do her routine daily activities without pain and without her knee giving way  NEXT MD VISIT: prn  OBJECTIVE:  Note: Objective measures were completed at Evaluation unless otherwise noted.  DIAGNOSTIC FINDINGS:  05/30/22: RIGHT  KNEE 3 VIEWS  COMPARISON:  Bilateral knee radiographs May 19th, 2015  FINDINGS: No acute fracture or dislocation. No joint effusion. Mild lateral compartment subchondral sclerosis and cystic changes with mild osteophytosis. No soft tissue abnormalities.  IMPRESSION: Mild lateral compartment osteoarthritis.  07/28/22 Left knee: Resulted by: Judi Saa, DO Performed: 07/28/22 1411 - 07/28/22 1411  Accession number: [id number] Resulting lab: [facility]  Narrative: Limited muscular skeletal ultrasound was performed and interpreted by Antoine Primas, M Limited ultrasound continues to show the patient does have some hypoechoic changes in the patellofemoral joint noted.  Some degenerative changes of the meniscus noted. Impression: Improvement in inflammation but still continue effusion    PATIENT SURVEYS:  Eval: FOTO 38, predicted 55  COGNITION: Overall cognitive status: Within functional limits for tasks assessed     SENSATION: WFL  EDEMA:  Figure 8: measure next visit  POSTURE:  slight knee valgus bilaterally  PALPATION: Mod crepitus noted on seated open chain flexion/extension  LOWER EXTREMITY ROM:  WNL LOWER EXTREMITY MMT:  Generally 4/5 with exception of bilateral knee flexion 4-/5 and bilateral hip abd 4-/5 and bilateral hip ER 3+/5  LOWER EXTREMITY SPECIAL TESTS:  Knee special tests: Patellafemoral grind test: positive   FUNCTIONAL TESTS:  5 times sit to stand: 24 sec Timed up and go (TUG): 14.5 sec  GAIT: Distance walked: 50 feet Assistive device utilized: None Level of assistance: Complete Independence Comments: antalgic, slow, right LE externally rotated, guarded, short step length   TODAY'S TREATMENT:                                                                                                                              DATE: 04/12/23 Nu-Step L1 8 min ( uncomfortable at first but improves with time) Discussed benefits of ROM for  "greasing the joint" LAQ 2# 20x right/left Supine quad sets 10x Supine SAQ with ball 10x SLR 10x right/left Bridge (small to avoid pressure on neck, some HS cramping) Sidelying clam 10x right/left (challenged with this) Seated red band HS curls with slider 10x right/left (given red band for home) Standing heel raises 15x Discussed wider stance and momentum for greater ease with sit to stand    PATIENT EDUCATION:  Education details: Initiated HEP Person educated: Patient Education method: Explanation,  Demonstration, Verbal cues, and Handouts Education comprehension: verbalized understanding, returned demonstration, and verbal cues required  HOME EXERCISE PROGRAM: Access Code: [email or web address] URL: [email or web address] Date: 04/12/2023 Prepared by: Lavinia Sharps  Exercises - Long Sitting Quad Set  - 1 x daily - 7 x weekly - 3 sets - 10 reps - Supine Knee Extension Strengthening  - 1 x daily - 7 x weekly - 3 sets - 10 reps - Seated Long Arc Quad  - 1 x daily - 7 x weekly - 3 sets - 10 reps - Straight Leg Raise  - 1 x daily - 7 x weekly - 1 sets - 10 reps - Clamshell  - 1 x daily - 7 x weekly - 1 sets - 10 reps - Seated Hamstring Curl with Anchored Resistance  - 1 x daily - 7 x weekly - 1 sets - 10 reps - Standing Heel Raise with Support  - 1 x daily - 7 x weekly - 1 sets - 10 reps  ASSESSMENT:  CLINICAL IMPRESSION: Therapist progressing and updating HEP for increased intensity and challenge level for further strengthening and functional mobility.   She reports some joint clicking and initial discomfort with some of the ex's but this improves significantly with repetition.  She has a positive initial response to the ex's (mostly unloaded positions).  Therapist monitoring response throughout treatment session and providing verbal cues to optimize technique for ex effectiveness.     OBJECTIVE IMPAIRMENTS: Abnormal gait, decreased balance, decreased knowledge of use of DME,  decreased mobility, difficulty walking, decreased strength, increased edema, increased fascial restrictions, increased muscle spasms, impaired flexibility, postural dysfunction, obesity, and pain.   ACTIVITY LIMITATIONS: lifting, bending, sitting, standing, squatting, sleeping, stairs, transfers, bed mobility, bathing, toileting, dressing, and caring for others  PARTICIPATION LIMITATIONS: meal prep, cleaning, laundry, driving, shopping, community activity, yard work, and church  PERSONAL FACTORS: Fitness, Time since onset of injury/illness/exacerbation, and 1-2 comorbidities: anxiety and obesity  are also affecting patient's functional outcome.   REHAB POTENTIAL: Fair due to possible mechanical meniscus injury or joint deterioration  CLINICAL DECISION MAKING: Evolving/moderate complexity  EVALUATION COMPLEXITY: Moderate   GOALS: Goals reviewed with patient? Yes  SHORT TERM GOALS: Target date: 05/04/2023   Patient will be independent with initial HEP  Baseline: Goal status: INITIAL  2.  Pain report to be no greater than 4/10  Baseline:  Goal status: INITIAL   LONG TERM GOALS: Target date: 06/01/2023   Patient to be independent with advanced HEP  Baseline:  Goal status: INITIAL  2.  Patient to report pain no greater than 2/10  Baseline:  Goal status: INITIAL  3.  Patient to be able to bend, stoop and squat with pain no greater than 2/10  Baseline:  Goal status: INITIAL  4.  Patient to be able to ascend and descend steps without pain or no greater than 2/10  Baseline:  Goal status: INITIAL  5.  Patient to be able to sleep through the night  Baseline:  Goal status: INITIAL  6.  Patient to report 85% improvement in overall symptoms  Baseline:  Goal status: INITIAL   PLAN:  PT FREQUENCY: 1-2x/week  PT DURATION: 8 weeks  PLANNED INTERVENTIONS: 97110-Therapeutic exercises, 97530- Therapeutic activity, [id number]- Neuromuscular re-education, 97535- Self Care, 44034-  Manual therapy, [id number]- Gait training, [phone]- Aquatic Therapy, [phone]- Splinting, 97014- Electrical stimulation (unattended), [id number]- Electrical stimulation (manual), [id number]- Vasopneumatic device, [id number]- Ultrasound, [id number]- Ionotophoresis 4mg /ml Dexamethasone, Patient/Family education, Balance training, Stair training, Taping, Dry Needling, Joint mobilization,  Scar mobilization, Compression bandaging, Vestibular training, Visual/preceptual remediation/compensation, Cryotherapy, and Moist heat  PLAN FOR NEXT SESSION: try recumbent bike; try sit to stand with feet wider stance; Review HEP, Nustep, progress quad rehab, postural training.  Lavinia Sharps, PT 04/12/23 12:02 PM Phone: [phone] Fax: [phone]  [facility] [address], [address] [place], [place] [address] Phone # [phone] Fax [phone]

## 2023-04-14 ENCOUNTER — Encounter: Payer: PPO | Admitting: Physical Therapy

## 2023-04-15 ENCOUNTER — Ambulatory Visit: Payer: PPO | Attending: Family Medicine | Admitting: Physical Therapy

## 2023-04-15 DIAGNOSIS — R262 Difficulty in walking, not elsewhere classified: Secondary | ICD-10-CM | POA: Insufficient documentation

## 2023-04-15 DIAGNOSIS — G8929 Other chronic pain: Secondary | ICD-10-CM | POA: Insufficient documentation

## 2023-04-15 DIAGNOSIS — M6281 Muscle weakness (generalized): Secondary | ICD-10-CM | POA: Diagnosis not present

## 2023-04-15 DIAGNOSIS — R252 Cramp and spasm: Secondary | ICD-10-CM | POA: Diagnosis not present

## 2023-04-15 DIAGNOSIS — M25561 Pain in right knee: Secondary | ICD-10-CM | POA: Diagnosis not present

## 2023-04-15 DIAGNOSIS — M25661 Stiffness of right knee, not elsewhere classified: Secondary | ICD-10-CM | POA: Insufficient documentation

## 2023-04-15 NOTE — Therapy (Signed)
OUTPATIENT PHYSICAL THERAPY LOWER EXTREMITY PROGRESS NOTE   Patient Name: Jean Smith MRN: [id number] DOB:08-Dec-1950, 72 y.o., female Today's Date: 04/15/2023  END OF SESSION:  PT End of Session - 04/15/23 0937     Visit Number 3    Date for PT Re-Evaluation 06/01/23    Authorization Type [employer]    Progress Note Due on Visit 10    PT Start Time 0934    PT Stop Time 1015    PT Time Calculation (min) 41 min    Activity Tolerance Patient tolerated treatment well             Past Medical History:  Diagnosis Date   Allergy    Anxiety    Aortic regurgitation    moderate by echo 10/2020   Arthritis    Cancer (HCC)    skin   Cataract    bilateral - MD is just watching    Diverticulosis    Fuchs' endothelial dystrophy    follows with optho regularly    Gestational diabetes    Heart murmur    MVP    History of depression    HSV-2 infection    Hyperlipidemia    Hyperplastic colon polyp    Mitral valve prolapse    mild to moderate MR by echo 10/2020   PVC's (premature ventricular contractions)    intol of BB, sxc palpitations r/t stress   RVOT ventricular tachycardia/PVCs    EP eval 01/2013 for freq PVCs   Past Surgical History:  Procedure Laterality Date   CESAREAN SECTION     x 3   COLONOSCOPY     KNEE SURGERY Right    MANDIBLE SURGERY     right side in front of ear   MOHS SURGERY  2021   POLYPECTOMY     WISDOM TOOTH EXTRACTION     Patient Active Problem List   Diagnosis Date Noted   AC (acromioclavicular) arthritis 05/28/2022   Mitral valve prolapse    Aortic regurgitation    NSVT (nonsustained ventricular tachycardia) (HCC) 09/25/2020   Diabetes mellitus type 2 with complications (HCC) 08/27/2020   Chronic pain of both shoulders 05/05/2018   Other fatigue 01/02/2018   Allergic rhinitis 01/02/2018   Routine general medical examination at a health care facility 09/01/2015   Hyperlipidemia associated with type 2 diabetes mellitus  (HCC) 09/01/2015   Varicose vein 08/27/2014   Primary localized osteoarthrosis, lower leg 10/30/2013   RVOT ventricular tachycardia/PVCs 01/31/2013   Abnormal stress echo 12/11/2012    PCP: Myrlene Broker, MD  REFERRING PROVIDER: Judi Saa, DO  REFERRING DIAG: [phone] (ICD-10-CM) - Chronic pain of right knee  THERAPY DIAG:  Chronic pain of right knee  Stiffness of right knee, not elsewhere classified  Muscle weakness (generalized)  Rationale for Evaluation and Treatment: Rehabilitation  ONSET DATE: 03/31/2023  SUBJECTIVE:   SUBJECTIVE STATEMENT: I had some soreness but it goes away.  I did the ex's on Wednesday.  A little pain now but I hurt over night. Has a stationary bike in the garage.  PERTINENT HISTORY: Knee scope approx 15 years ago (right knee) PAIN:  Are you having pain? Yes: NPRS scale: 5/10 Pain location: right > left Pain description: aching, sharp Aggravating factors: walking, standing, bending,stooping, squatting Relieving factors: rest, meds  PRECAUTIONS: Fall  RED FLAGS: None   WEIGHT BEARING RESTRICTIONS: No  FALLS:  Has patient fallen in last 6 months? No  LIVING ENVIRONMENT: Lives with: lives with  their spouse Lives in: House/apartment Stairs: Yes: Internal: 12 steps; on right going up and External: 5 steps; on right going up Has following equipment at home: None  OCCUPATION: retired  PLOF: Independent, Independent with basic ADLs, Independent with household mobility without device, Independent with community mobility without device, Independent with homemaking with ambulation, Independent with gait, and Independent with transfers  PATIENT GOALS: to be able to walk and get up and down from a chair and do her routine daily activities without pain and without her knee giving way  NEXT MD VISIT: prn  OBJECTIVE:  Note: Objective measures were completed at Evaluation unless otherwise noted.  DIAGNOSTIC FINDINGS:   05/30/22: RIGHT KNEE 3 VIEWS  COMPARISON:  Bilateral knee radiographs May 19th, 2015  FINDINGS: No acute fracture or dislocation. No joint effusion. Mild lateral compartment subchondral sclerosis and cystic changes with mild osteophytosis. No soft tissue abnormalities.  IMPRESSION: Mild lateral compartment osteoarthritis.  07/28/22 Left knee: Resulted by: Judi Saa, DO Performed: 07/28/22 1411 - 07/28/22 1411  Accession number: [id number] Resulting lab:  RADIOLOGY  Narrative: Limited muscular skeletal ultrasound was performed and interpreted by Antoine Primas, M Limited ultrasound continues to show the patient does have some hypoechoic changes in the patellofemoral joint noted.  Some degenerative changes of the meniscus noted. Impression: Improvement in inflammation but still continue effusion    PATIENT SURVEYS:  Eval: FOTO 38, predicted 55  COGNITION: Overall cognitive status: Within functional limits for tasks assessed     SENSATION: WFL  EDEMA:  Figure 8: measure next visit  POSTURE:  slight knee valgus bilaterally  PALPATION: Mod crepitus noted on seated open chain flexion/extension  LOWER EXTREMITY ROM:  WNL LOWER EXTREMITY MMT:  Generally 4/5 with exception of bilateral knee flexion 4-/5 and bilateral hip abd 4-/5 and bilateral hip ER 3+/5  LOWER EXTREMITY SPECIAL TESTS:  Knee special tests: Patellafemoral grind test: positive   FUNCTIONAL TESTS:  5 times sit to stand: 24 sec Timed up and go (TUG): 14.5 sec  GAIT: Distance walked: 50 feet Assistive device utilized: None Level of assistance: Complete Independence Comments: antalgic, slow, right LE externally rotated, guarded, short step length   TODAY'S TREATMENT:      DATE: 04/15/23 Bike 7 min no resistance   HR 111   O2 98% SLR 10x right/left Bridge 5x (small to avoid pressure on neck) Sidelying clam 10x right/left (challenged) Seated clam bil red band (easy);  Seated red band  around thighs with floor sliders with abduction (more difficult on right) 10x right/left  LAQ 2# 20x right/left (more difficult on right) Seated red band HS curls with slider 10x right/left (has red band for home) Mat table + cushion sit to stand painful even with wider stance 6x Education on the benefits of ex for OA and strategy for unloaded positions first  DATE: 04/12/23 Nu-Step L1 8 min ( uncomfortable at first but improves with time) Discussed benefits of ROM for "greasing the joint" LAQ 2# 20x right/left Supine quad sets 10x Supine SAQ with ball 10x SLR 10x right/left Bridge (small to avoid pressure on neck, some HS cramping) Sidelying clam 10x right/left (challenged with this) Seated red band HS curls with slider 10x right/left (given red band for home) Standing heel raises 15x Discussed wider stance and momentum for greater ease with sit to stand    PATIENT EDUCATION:  Education details: Initiated HEP Person educated: Patient Education method: Programmer, multimedia, Facilities manager, Verbal cues, and Handouts Education comprehension: verbalized understanding, returned demonstration, and verbal cues required  HOME EXERCISE PROGRAM: Access Code: [email or web address] URL: [email or web address] Date: 04/12/2023 Prepared by: Lavinia Sharps  Exercises - Long Sitting Quad Set  - 1 x daily - 7 x weekly - 3 sets - 10 reps - Supine Knee Extension Strengthening  - 1 x daily - 7 x weekly - 3 sets - 10 reps - Seated Long Arc Quad  - 1 x daily - 7 x weekly - 3 sets - 10 reps - Straight Leg Raise  - 1 x daily - 7 x weekly - 1 sets - 10 reps - Clamshell  - 1 x daily - 7 x weekly - 1 sets - 10 reps - Seated Hamstring Curl with Anchored Resistance  - 1 x daily - 7 x weekly - 1 sets - 10 reps - Standing Heel Raise with Support  - 1 x daily - 7 x weekly - 1 sets - 10  reps  ASSESSMENT:  CLINICAL IMPRESSION: The patient had a good initial response to exercise and is hopeful using her home recumbent bike regularly will help with her diabetes diagnosis.  She does well with most exercises with minimal to no pain in an unloaded position (supine and seated) although she is quite challenged muscularly with several including the gluteals.  Verbal cues for proper recruitment of glute medius muscle activation and decrease compensatory strategies.   OBJECTIVE IMPAIRMENTS: Abnormal gait, decreased balance, decreased knowledge of use of DME, decreased mobility, difficulty walking, decreased strength, increased edema, increased fascial restrictions, increased muscle spasms, impaired flexibility, postural dysfunction, obesity, and pain.   ACTIVITY LIMITATIONS: lifting, bending, sitting, standing, squatting, sleeping, stairs, transfers, bed mobility, bathing, toileting, dressing, and caring for others  PARTICIPATION LIMITATIONS: meal prep, cleaning, laundry, driving, shopping, community activity, yard work, and church  PERSONAL FACTORS: Fitness, Time since onset of injury/illness/exacerbation, and 1-2 comorbidities: anxiety and obesity  are also affecting patient's functional outcome.   REHAB POTENTIAL: Fair due to possible mechanical meniscus injury or joint deterioration  CLINICAL DECISION MAKING: Evolving/moderate complexity  EVALUATION COMPLEXITY: Moderate   GOALS: Goals reviewed with patient? Yes  SHORT TERM GOALS: Target date: 05/04/2023   Patient will be independent with initial HEP  Baseline: Goal status: INITIAL  2.  Pain report to be no greater than 4/10  Baseline:  Goal status: INITIAL   LONG TERM GOALS: Target date: 06/01/2023   Patient to be independent with advanced HEP  Baseline:  Goal status: INITIAL  2.  Patient to report pain no greater than 2/10  Baseline:  Goal status: INITIAL  3.  Patient to be able to bend, stoop and squat  with pain no greater than 2/10  Baseline:  Goal status: INITIAL  4.  Patient to be able to ascend and descend steps without pain or no greater than 2/10  Baseline:  Goal status: INITIAL  5.  Patient to be able to sleep through the night  Baseline:  Goal status: INITIAL  6.  Patient to report 85% improvement in overall symptoms  Baseline:  Goal status: INITIAL   PLAN:  PT FREQUENCY: 1-2x/week  PT DURATION: 8 weeks  PLANNED INTERVENTIONS: 97110-Therapeutic exercises, 97530- Therapeutic activity, [id number]- Neuromuscular re-education, 97535- Self Care, [id number]- Manual therapy, [id number]- Gait training, [phone]- Aquatic Therapy, [phone]- Splinting, 97014- Electrical stimulation (unattended), [id number]- Electrical stimulation (manual), 97016- Vasopneumatic device, [id number]- Ultrasound, [id number]- Ionotophoresis 4mg /ml Dexamethasone, Patient/Family education, Balance training, Stair training, Taping, Dry Needling, Joint mobilization, Scar mobilization, Compression bandaging, Vestibular training, Visual/preceptual remediation/compensation, Cryotherapy, and Moist heat  PLAN FOR NEXT SESSION:recumbent bike;  sit to stand with feet wider stance; Review HEP, Nustep, progress quad rehab, postural training.   Lavinia Sharps, PT 04/15/23 10:34 AM Phone: [phone] Fax: [phone]  [facility] [address], [address] [place], [place] [address] Phone # [phone] Fax [phone]

## 2023-04-18 ENCOUNTER — Encounter: Payer: Self-pay | Admitting: Rehabilitative and Restorative Service Providers"

## 2023-04-18 ENCOUNTER — Ambulatory Visit: Payer: PPO | Admitting: Rehabilitative and Restorative Service Providers"

## 2023-04-18 DIAGNOSIS — M6281 Muscle weakness (generalized): Secondary | ICD-10-CM

## 2023-04-18 DIAGNOSIS — R262 Difficulty in walking, not elsewhere classified: Secondary | ICD-10-CM

## 2023-04-18 DIAGNOSIS — M25561 Pain in right knee: Secondary | ICD-10-CM | POA: Diagnosis not present

## 2023-04-18 DIAGNOSIS — R252 Cramp and spasm: Secondary | ICD-10-CM

## 2023-04-18 DIAGNOSIS — M25661 Stiffness of right knee, not elsewhere classified: Secondary | ICD-10-CM

## 2023-04-18 DIAGNOSIS — G8929 Other chronic pain: Secondary | ICD-10-CM

## 2023-04-18 NOTE — Therapy (Signed)
OUTPATIENT PHYSICAL THERAPY TREATMENT NOTE   Patient Name: Jean Smith MRN: [id number] DOB:Dec 02, 1950, 72 y.o., female Today's Date: 04/18/2023  END OF SESSION:  PT End of Session - 04/18/23 1113     Visit Number 4    Date for PT Re-Evaluation 06/01/23    Authorization Type [employer]    Progress Note Due on Visit 10    PT Start Time 1105    PT Stop Time 1145    PT Time Calculation (min) 40 min    Activity Tolerance Patient tolerated treatment well    Behavior During Therapy [facility] for tasks assessed/performed             Past Medical History:  Diagnosis Date   Allergy    Anxiety    Aortic regurgitation    moderate by echo 10/2020   Arthritis    Cancer (HCC)    skin   Cataract    bilateral - MD is just watching    Diverticulosis    Fuchs' endothelial dystrophy    follows with optho regularly    Gestational diabetes    Heart murmur    MVP    History of depression    HSV-2 infection    Hyperlipidemia    Hyperplastic colon polyp    Mitral valve prolapse    mild to moderate MR by echo 10/2020   PVC's (premature ventricular contractions)    intol of BB, sxc palpitations r/t stress   RVOT ventricular tachycardia/PVCs    EP eval 01/2013 for freq PVCs   Past Surgical History:  Procedure Laterality Date   CESAREAN SECTION     x 3   COLONOSCOPY     KNEE SURGERY Right    MANDIBLE SURGERY     right side in front of ear   MOHS SURGERY  2021   POLYPECTOMY     WISDOM TOOTH EXTRACTION     Patient Active Problem List   Diagnosis Date Noted   AC (acromioclavicular) arthritis 05/28/2022   Mitral valve prolapse    Aortic regurgitation    NSVT (nonsustained ventricular tachycardia) (HCC) 09/25/2020   Diabetes mellitus type 2 with complications (HCC) 08/27/2020   Chronic pain of both shoulders 05/05/2018   Other fatigue 01/02/2018   Allergic rhinitis 01/02/2018   Routine general medical examination at a health care facility 09/01/2015    Hyperlipidemia associated with type 2 diabetes mellitus (HCC) 09/01/2015   Varicose vein 08/27/2014   Primary localized osteoarthrosis, lower leg 10/30/2013   RVOT ventricular tachycardia/PVCs 01/31/2013   Abnormal stress echo 12/11/2012    PCP: Myrlene Broker, MD  REFERRING PROVIDER: Judi Saa, DO  REFERRING DIAG: [phone] (ICD-10-CM) - Chronic pain of right knee  THERAPY DIAG:  Chronic pain of right knee  Stiffness of right knee, not elsewhere classified  Muscle weakness (generalized)  Cramp and spasm  Difficulty in walking, not elsewhere classified  Rationale for Evaluation and Treatment: Rehabilitation  ONSET DATE: 03/31/2023  SUBJECTIVE:   SUBJECTIVE STATEMENT: Pt reports that she has been moving, so she has ben having increased pain.  Pt reports pain at rest is 5/10.  PERTINENT HISTORY: Knee scope approx 15 years ago (right knee) PAIN:  Are you having pain? Yes: NPRS scale: 5-8/10 Pain location: right > left Pain description: aching, sharp Aggravating factors: walking, standing, bending,stooping, squatting Relieving factors: rest, meds  PRECAUTIONS: Fall  RED FLAGS: None   WEIGHT BEARING RESTRICTIONS: No  FALLS:  Has patient fallen in last 6  months? No  LIVING ENVIRONMENT: Lives with: lives with their spouse Lives in: House/apartment Stairs: Yes: Internal: 12 steps; on right going up and External: 5 steps; on right going up Has following equipment at home: None  OCCUPATION: retired  PLOF: Independent, Independent with basic ADLs, Independent with household mobility without device, Independent with community mobility without device, Independent with homemaking with ambulation, Independent with gait, and Independent with transfers  PATIENT GOALS: to be able to walk and get up and down from a chair and do her routine daily activities without pain and without her knee giving way  NEXT MD VISIT: prn  OBJECTIVE:  Note: Objective  measures were completed at Evaluation unless otherwise noted.  DIAGNOSTIC FINDINGS:  05/30/22: RIGHT KNEE 3 VIEWS  COMPARISON:  Bilateral knee radiographs May 19th, 2015  FINDINGS: No acute fracture or dislocation. No joint effusion. Mild lateral compartment subchondral sclerosis and cystic changes with mild osteophytosis. No soft tissue abnormalities.  IMPRESSION: Mild lateral compartment osteoarthritis.  07/28/22 Left knee: Resulted by: Judi Saa, DO Performed: 07/28/22 1411 - 07/28/22 1411  Accession number: [id number] Resulting lab: [facility]  Narrative: Limited muscular skeletal ultrasound was performed and interpreted by Antoine Primas, M Limited ultrasound continues to show the patient does have some hypoechoic changes in the patellofemoral joint noted.  Some degenerative changes of the meniscus noted. Impression: Improvement in inflammation but still continue effusion    PATIENT SURVEYS:  Eval: FOTO 38, predicted 55  COGNITION: Overall cognitive status: Within functional limits for tasks assessed     SENSATION: WFL  EDEMA:  Figure 8: measure next visit  POSTURE:  slight knee valgus bilaterally  PALPATION: Mod crepitus noted on seated open chain flexion/extension  LOWER EXTREMITY ROM:  WNL LOWER EXTREMITY MMT:  Generally 4/5 with exception of bilateral knee flexion 4-/5 and bilateral hip abd 4-/5 and bilateral hip ER 3+/5  LOWER EXTREMITY SPECIAL TESTS:  Knee special tests: Patellafemoral grind test: positive   FUNCTIONAL TESTS:  Eval: 5 times sit to stand: 24 sec Timed up and go (TUG): 14.5 sec  GAIT: Distance walked: 50 feet Assistive device utilized: None Level of assistance: Complete Independence Comments: antalgic, slow, right LE externally rotated, guarded, short step length   TODAY'S TREATMENT:       DATE: 04/18/2023 Recumbent bike level 1 x7 min with PT present to discuss status Standing hamstring stretch at step 2x20  sec bilat Education on stair negotiation with step to pattern.  Then negotiated 4 steps with step to pattern with bilat rails FWD step ups with 4" step ups with bilat UE support 2x10 bilat Heel tap down from 2" step with UE support x10 bilat Seated LAQ with 2# 2x10 bilat Seated with with 2# ankle weights:  side "step/lift" over low cone 2x10 bilat Sit to/from stand with elevated surface (foam pad on mat table) x5    DATE: 04/15/23 Bike 7 min no resistance   HR 111   O2 98% SLR 10x right/left Bridge 5x (small to avoid pressure on neck) Sidelying clam 10x right/left (challenged) Seated clam bil red band (easy);  Seated red band around thighs with floor sliders with abduction (more difficult on right) 10x right/left  LAQ 2# 20x right/left (more difficult on right) Seated red band HS curls with slider 10x right/left (has red band for home) Mat table + cushion sit to stand painful even with wider stance 6x Education on the benefits of ex for OA and strategy for unloaded positions first  DATE: 04/12/23 Nu-Step L1 8 min ( uncomfortable at first but improves with time) Discussed benefits of ROM for "greasing the joint" LAQ 2# 20x right/left Supine quad sets 10x Supine SAQ with ball 10x SLR 10x right/left Bridge (small to avoid pressure on neck, some HS cramping) Sidelying clam 10x right/left (challenged with this) Seated red band HS curls with slider 10x right/left (given red band for home) Standing heel raises 15x Discussed wider stance and momentum for greater ease with sit to stand    PATIENT EDUCATION:  Education details: Initiated HEP Person educated: Patient Education method: Programmer, multimedia, Facilities manager, Verbal cues, and Handouts Education comprehension: verbalized understanding, returned demonstration, and verbal cues required  HOME EXERCISE PROGRAM: Access  Code: [email or web address] URL: [email or web address] Date: 04/12/2023 Prepared by: Lavinia Sharps  Exercises - Long Sitting Quad Set  - 1 x daily - 7 x weekly - 3 sets - 10 reps - Supine Knee Extension Strengthening  - 1 x daily - 7 x weekly - 3 sets - 10 reps - Seated Long Arc Quad  - 1 x daily - 7 x weekly - 3 sets - 10 reps - Straight Leg Raise  - 1 x daily - 7 x weekly - 1 sets - 10 reps - Clamshell  - 1 x daily - 7 x weekly - 1 sets - 10 reps - Seated Hamstring Curl with Anchored Resistance  - 1 x daily - 7 x weekly - 1 sets - 10 reps - Standing Heel Raise with Support  - 1 x daily - 7 x weekly - 1 sets - 10 reps  ASSESSMENT:  CLINICAL IMPRESSION: Ms Canady presents to skilled PT reporting that she is having the most difficulty with navigation of stairs and with getting in and out of the car.  Treatment session today focused on functional tasks related to those activities.  Patient required cuing with long arc quad to maintain terminal knee extension/quad set for 1-2 sec hold for improved quad activation.  Patient educated on continued performing of HEP at home to continue to progress with LE strengthening to assist with improved functional tasks; pt verbalizes understanding.  OBJECTIVE IMPAIRMENTS: Abnormal gait, decreased balance, decreased knowledge of use of DME, decreased mobility, difficulty walking, decreased strength, increased edema, increased fascial restrictions, increased muscle spasms, impaired flexibility, postural dysfunction, obesity, and pain.   ACTIVITY LIMITATIONS: lifting, bending, sitting, standing, squatting, sleeping, stairs, transfers, bed mobility, bathing, toileting, dressing, and caring for others  PARTICIPATION LIMITATIONS: meal prep, cleaning, laundry, driving, shopping, community activity, yard work, and church  PERSONAL FACTORS: Fitness, Time since onset of injury/illness/exacerbation, and 1-2 comorbidities: anxiety and obesity  are also affecting  patient's functional outcome.   REHAB POTENTIAL: Fair due to possible mechanical meniscus injury or joint deterioration  CLINICAL DECISION MAKING: Evolving/moderate complexity  EVALUATION COMPLEXITY: Moderate   GOALS: Goals reviewed with patient? Yes  SHORT TERM GOALS: Target date: 05/04/2023   Patient will be independent with initial HEP  Baseline: Goal status: Ongoing  2.  Pain report to be no greater than 4/10  Baseline:  Goal status: Ongoing   LONG TERM GOALS: Target date: 06/01/2023   Patient to be independent with advanced HEP  Baseline:  Goal status: INITIAL  2.  Patient to report pain no greater than 2/10  Baseline:  Goal status: INITIAL  3.  Patient to be able to bend, stoop and squat with pain no greater than 2/10  Baseline:  Goal status: INITIAL  4.  Patient to be able to  ascend and descend steps without pain or no greater than 2/10  Baseline:  Goal status: INITIAL  5.  Patient to be able to sleep through the night  Baseline:  Goal status: INITIAL  6.  Patient to report 85% improvement in overall symptoms  Baseline:  Goal status: INITIAL   PLAN:  PT FREQUENCY: 1-2x/week  PT DURATION: 8 weeks  PLANNED INTERVENTIONS: 97110-Therapeutic exercises, 97530- Therapeutic activity, [id number]- Neuromuscular re-education, 97535- Self Care, [id number]- Manual therapy, [id number]- Gait training, [phone]- Aquatic Therapy, [phone]- Splinting, 97014- Electrical stimulation (unattended), [id number]- Electrical stimulation (manual), 97016- Vasopneumatic device, [id number]- Ultrasound, [id number]- Ionotophoresis 4mg /ml Dexamethasone, Patient/Family education, Balance training, Stair training, Taping, Dry Needling, Joint mobilization, Scar mobilization, Compression bandaging, Vestibular training, Visual/preceptual remediation/compensation, Cryotherapy, and Moist heat  PLAN FOR NEXT SESSION: Assess and progress HEP as indicated, strengthening, sit to stand with feet wider stance; Review HEP, progress  quad rehab, postural training.   Reather Laurence, PT, DPT 04/18/23, 12:01 PM  [facility] [address] [place], [place] [address] Phone # [phone] Fax [phone]

## 2023-04-21 ENCOUNTER — Ambulatory Visit: Payer: PPO | Admitting: Physical Therapy

## 2023-04-21 DIAGNOSIS — G8929 Other chronic pain: Secondary | ICD-10-CM

## 2023-04-21 DIAGNOSIS — M25661 Stiffness of right knee, not elsewhere classified: Secondary | ICD-10-CM

## 2023-04-21 DIAGNOSIS — M6281 Muscle weakness (generalized): Secondary | ICD-10-CM

## 2023-04-21 DIAGNOSIS — M25561 Pain in right knee: Secondary | ICD-10-CM | POA: Diagnosis not present

## 2023-04-21 NOTE — Therapy (Signed)
OUTPATIENT PHYSICAL THERAPY TREATMENT NOTE   Patient Name: Jean Smith MRN: [id number] DOB:Mar 25, 1951, 72 y.o., female Today's Date: 04/21/2023  END OF SESSION:  PT End of Session - 04/21/23 0941     Visit Number 5    Date for PT Re-Evaluation 06/01/23    Authorization Type [employer]    Progress Note Due on Visit 10    PT Start Time 0936    PT Stop Time 1014    PT Time Calculation (min) 38 min    Activity Tolerance Patient tolerated treatment well             Past Medical History:  Diagnosis Date   Allergy    Anxiety    Aortic regurgitation    moderate by echo 10/2020   Arthritis    Cancer (HCC)    skin   Cataract    bilateral - MD is just watching    Diverticulosis    Fuchs' endothelial dystrophy    follows with optho regularly    Gestational diabetes    Heart murmur    MVP    History of depression    HSV-2 infection    Hyperlipidemia    Hyperplastic colon polyp    Mitral valve prolapse    mild to moderate MR by echo 10/2020   PVC's (premature ventricular contractions)    intol of BB, sxc palpitations r/t stress   RVOT ventricular tachycardia/PVCs    EP eval 01/2013 for freq PVCs   Past Surgical History:  Procedure Laterality Date   CESAREAN SECTION     x 3   COLONOSCOPY     KNEE SURGERY Right    MANDIBLE SURGERY     right side in front of ear   MOHS SURGERY  2021   POLYPECTOMY     WISDOM TOOTH EXTRACTION     Patient Active Problem List   Diagnosis Date Noted   AC (acromioclavicular) arthritis 05/28/2022   Mitral valve prolapse    Aortic regurgitation    NSVT (nonsustained ventricular tachycardia) (HCC) 09/25/2020   Diabetes mellitus type 2 with complications (HCC) 08/27/2020   Chronic pain of both shoulders 05/05/2018   Other fatigue 01/02/2018   Allergic rhinitis 01/02/2018   Routine general medical examination at a health care facility 09/01/2015   Hyperlipidemia associated with type 2 diabetes mellitus (HCC) 09/01/2015    Varicose vein 08/27/2014   Primary localized osteoarthrosis, lower leg 10/30/2013   RVOT ventricular tachycardia/PVCs 01/31/2013   Abnormal stress echo 12/11/2012    PCP: Myrlene Broker, MD  REFERRING PROVIDER: Judi Saa, DO  REFERRING DIAG: [phone] (ICD-10-CM) - Chronic pain of right knee  THERAPY DIAG:  Chronic pain of right knee  Stiffness of right knee, not elsewhere classified  Muscle weakness (generalized)  Rationale for Evaluation and Treatment: Rehabilitation  ONSET DATE: 03/31/2023  SUBJECTIVE:   SUBJECTIVE STATEMENT: What we did was hard last time but I think it really helped.  I hurt but I used heat and ice and it went away.  It hurts to put full weight on my leg.  PERTINENT HISTORY: Knee scope approx 15 years ago (right knee) PAIN:  Are you having pain? Yes: NPRS scale: 5/10 Pain location: right > left Pain description: aching, sharp Aggravating factors: walking, standing, bending,stooping, squatting Relieving factors: rest, meds  PRECAUTIONS: Fall  RED FLAGS: None   WEIGHT BEARING RESTRICTIONS: No  FALLS:  Has patient fallen in last 6 months? No  LIVING ENVIRONMENT: Lives with:  lives with their spouse Lives in: House/apartment Stairs: Yes: Internal: 12 steps; on right going up and External: 5 steps; on right going up Has following equipment at home: None  OCCUPATION: retired  PLOF: Independent, Independent with basic ADLs, Independent with household mobility without device, Independent with community mobility without device, Independent with homemaking with ambulation, Independent with gait, and Independent with transfers  PATIENT GOALS: to be able to walk and get up and down from a chair and do her routine daily activities without pain and without her knee giving way  NEXT MD VISIT: prn  OBJECTIVE:  Note: Objective measures were completed at Evaluation unless otherwise noted.  DIAGNOSTIC FINDINGS:  05/30/22: RIGHT  KNEE 3 VIEWS  COMPARISON:  Bilateral knee radiographs May 19th, 2015  FINDINGS: No acute fracture or dislocation. No joint effusion. Mild lateral compartment subchondral sclerosis and cystic changes with mild osteophytosis. No soft tissue abnormalities.  IMPRESSION: Mild lateral compartment osteoarthritis.  07/28/22 Left knee: Resulted by: Judi Saa, DO Performed: 07/28/22 1411 - 07/28/22 1411  Accession number: [id number] Resulting lab: [facility]  Narrative: Limited muscular skeletal ultrasound was performed and interpreted by Antoine Primas, M Limited ultrasound continues to show the patient does have some hypoechoic changes in the patellofemoral joint noted.  Some degenerative changes of the meniscus noted. Impression: Improvement in inflammation but still continue effusion    PATIENT SURVEYS:  Eval: FOTO 38, predicted 55  COGNITION: Overall cognitive status: Within functional limits for tasks assessed     SENSATION: WFL  EDEMA:  Figure 8: measure next visit  POSTURE:  slight knee valgus bilaterally  PALPATION: Mod crepitus noted on seated open chain flexion/extension  LOWER EXTREMITY ROM:  WNL LOWER EXTREMITY MMT:  Generally 4/5 with exception of bilateral knee flexion 4-/5 and bilateral hip abd 4-/5 and bilateral hip ER 3+/5  LOWER EXTREMITY SPECIAL TESTS:  Knee special tests: Patellafemoral grind test: positive   FUNCTIONAL TESTS:  Eval: 5 times sit to stand: 24 sec Timed up and go (TUG): 14.5 sec  GAIT: Distance walked: 50 feet Assistive device utilized: None Level of assistance: Complete Independence Comments: antalgic, slow, right LE externally rotated, guarded, short step length   TODAY'S TREATMENT:      DATE: 04/21/2023 Recumbent bike level 1 x7 min with PT present to discuss status 2nd step: knee flexion 10x right /left (pt states "this is scary") Standing 2nd step: hamstring stretch  2x20 sec bilat FWD step ups with 4" step  ups with bilat UE support x10 right/left (very painful on right) Leg press seat 8 bil 70# 15x (6/10) single leg 30# 15x right/left Seated green band HS curls 15x right/left  ("I need this") Leaning over on tall table: hip extension 15x right/left ("I like this one.")  DATE: 04/18/2023 Recumbent bike level 1 x7 min with PT present to discuss status Standing hamstring stretch at step 2x20 sec bilat Education on stair negotiation with step to pattern.  Then negotiated 4 steps with step to pattern with bilat rails FWD step ups with 4" step ups with bilat UE support 2x10 bilat Heel tap down from 2" step with UE support x10 bilat Seated LAQ with 2# 2x10 bilat Seated with with 2# ankle weights:  side "step/lift" over low cone 2x10 bilat Sit to/from stand with elevated surface (foam pad on mat table) x5    DATE: 04/15/23 Bike 7 min no resistance   HR 111   O2 98% SLR 10x right/left Bridge 5x (small to avoid pressure  on neck) Sidelying clam 10x right/left (challenged) Seated clam bil red band (easy);  Seated red band around thighs with floor sliders with abduction (more difficult on right) 10x right/left  LAQ 2# 20x right/left (more difficult on right) Seated red band HS curls with slider 10x right/left (has red band for home) Mat table + cushion sit to stand painful even with wider stance 6x Education on the benefits of ex for OA and strategy for unloaded positions first                    PATIENT EDUCATION:  Education details: Initiated HEP Person educated: Patient Education method: Programmer, multimedia, Facilities manager, Verbal cues, and Handouts Education comprehension: verbalized understanding, returned demonstration, and verbal cues required  HOME EXERCISE PROGRAM: Access Code: [email or web address] URL: [email or web address] Date: 04/12/2023 Prepared by: Lavinia Sharps  Exercises - Long Sitting Quad Set  - 1 x daily - 7 x weekly - 3 sets - 10 reps - Supine Knee Extension Strengthening  -  1 x daily - 7 x weekly - 3 sets - 10 reps - Seated Long Arc Quad  - 1 x daily - 7 x weekly - 3 sets - 10 reps - Straight Leg Raise  - 1 x daily - 7 x weekly - 1 sets - 10 reps - Clamshell  - 1 x daily - 7 x weekly - 1 sets - 10 reps - Seated Hamstring Curl with Anchored Resistance  - 1 x daily - 7 x weekly - 1 sets - 10 reps - Standing Heel Raise with Support  - 1 x daily - 7 x weekly - 1 sets - 10 reps  ASSESSMENT:  CLINICAL IMPRESSION: 4 inch step ups painful on right but able to do double and single leg presses with minimal discomfort.  Several of the weight bearing ex's are painful at this point so may need modifications for unloaded strengthening initially.  Good response to HS strengthening in sitting and glute strengthening leaning on tall table.  Therapist providing verbal cues to optimize technique with  exercises in order to achieve the greatest benefit.   OBJECTIVE IMPAIRMENTS: Abnormal gait, decreased balance, decreased knowledge of use of DME, decreased mobility, difficulty walking, decreased strength, increased edema, increased fascial restrictions, increased muscle spasms, impaired flexibility, postural dysfunction, obesity, and pain.   ACTIVITY LIMITATIONS: lifting, bending, sitting, standing, squatting, sleeping, stairs, transfers, bed mobility, bathing, toileting, dressing, and caring for others  PARTICIPATION LIMITATIONS: meal prep, cleaning, laundry, driving, shopping, community activity, yard work, and church  PERSONAL FACTORS: Fitness, Time since onset of injury/illness/exacerbation, and 1-2 comorbidities: anxiety and obesity  are also affecting patient's functional outcome.   REHAB POTENTIAL: Fair due to possible mechanical meniscus injury or joint deterioration  CLINICAL DECISION MAKING: Evolving/moderate complexity  EVALUATION COMPLEXITY: Moderate   GOALS: Goals reviewed with patient? Yes  SHORT TERM GOALS: Target date: 05/04/2023   Patient will be  independent with initial HEP  Baseline: Goal status: Ongoing  2.  Pain report to be no greater than 4/10  Baseline:  Goal status: Ongoing   LONG TERM GOALS: Target date: 06/01/2023   Patient to be independent with advanced HEP  Baseline:  Goal status: INITIAL  2.  Patient to report pain no greater than 2/10  Baseline:  Goal status: INITIAL  3.  Patient to be able to bend, stoop and squat with pain no greater than 2/10  Baseline:  Goal status: INITIAL  4.  Patient to be able to ascend and descend  steps without pain or no greater than 2/10  Baseline:  Goal status: INITIAL  5.  Patient to be able to sleep through the night  Baseline:  Goal status: INITIAL  6.  Patient to report 85% improvement in overall symptoms  Baseline:  Goal status: INITIAL   PLAN:  PT FREQUENCY: 1-2x/week  PT DURATION: 8 weeks  PLANNED INTERVENTIONS: 97110-Therapeutic exercises, 97530- Therapeutic activity, [id number]- Neuromuscular re-education, 97535- Self Care, 08657- Manual therapy, [id number]- Gait training, [phone]- Aquatic Therapy, [phone]- Splinting, 97014- Electrical stimulation (unattended), [id number]- Electrical stimulation (manual), 97016- Vasopneumatic device, [id number]- Ultrasound, [id number]- Ionotophoresis 4mg /ml Dexamethasone, Patient/Family education, Balance training, Stair training, Taping, Dry Needling, Joint mobilization, Scar mobilization, Compression bandaging, Vestibular training, Visual/preceptual remediation/compensation, Cryotherapy, and Moist heat  PLAN FOR NEXT SESSION: Assess and progress HEP as indicated, strengthening, sit to stand with feet wider stance; Review HEP, progress quad rehab, postural training.  Lavinia Sharps, PT 04/21/23 1:39 PM Phone: [phone] Fax: [phone]   [facility] [address] [place], [place] [address] Phone # [phone] Fax [phone]

## 2023-04-26 ENCOUNTER — Ambulatory Visit: Payer: PPO | Admitting: Physical Therapy

## 2023-04-26 DIAGNOSIS — M25661 Stiffness of right knee, not elsewhere classified: Secondary | ICD-10-CM

## 2023-04-26 DIAGNOSIS — M25561 Pain in right knee: Secondary | ICD-10-CM | POA: Diagnosis not present

## 2023-04-26 DIAGNOSIS — M6281 Muscle weakness (generalized): Secondary | ICD-10-CM

## 2023-04-26 DIAGNOSIS — G8929 Other chronic pain: Secondary | ICD-10-CM

## 2023-04-26 NOTE — Therapy (Signed)
OUTPATIENT PHYSICAL THERAPY TREATMENT NOTE   Patient Name: Jean Smith MRN: [id number] DOB:[birth year], 72 y.o., female Today's Date: 04/26/2023  END OF SESSION:  PT End of Session - 04/26/23 1200     Visit Number 6    Date for PT Re-Evaluation 06/01/23    Progress Note Due on Visit 10    PT Start Time 1150    PT Stop Time 1230    PT Time Calculation (min) 40 min    Activity Tolerance Patient tolerated treatment well             Past Medical History:  Diagnosis Date   Allergy    Anxiety    Aortic regurgitation    moderate by echo 10/2020   Arthritis    Cancer (HCC)    skin   Cataract    bilateral - MD is just watching    Diverticulosis    Fuchs' endothelial dystrophy    follows with optho regularly    Gestational diabetes    Heart murmur    MVP    History of depression    HSV-2 infection    Hyperlipidemia    Hyperplastic colon polyp    Mitral valve prolapse    mild to moderate MR by echo 10/2020   PVC's (premature ventricular contractions)    intol of BB, sxc palpitations r/t stress   RVOT ventricular tachycardia/PVCs    EP eval 01/2013 for freq PVCs   Past Surgical History:  Procedure Laterality Date   CESAREAN SECTION     x 3   COLONOSCOPY     KNEE SURGERY Right    MANDIBLE SURGERY     right side in front of ear   MOHS SURGERY  2021   POLYPECTOMY     WISDOM TOOTH EXTRACTION     Patient Active Problem List   Diagnosis Date Noted   AC (acromioclavicular) arthritis 05/28/2022   Mitral valve prolapse    Aortic regurgitation    NSVT (nonsustained ventricular tachycardia) (HCC) 09/25/2020   Diabetes mellitus type 2 with complications (HCC) 08/27/2020   Chronic pain of both shoulders 05/05/2018   Other fatigue 01/02/2018   Allergic rhinitis 01/02/2018   Routine general medical examination at a health care facility 09/01/2015   Hyperlipidemia associated with type 2 diabetes mellitus (HCC) 09/01/2015   Varicose vein 08/27/2014   Primary localized  osteoarthrosis, lower leg 10/30/2013   RVOT ventricular tachycardia/PVCs 01/31/2013   Abnormal stress echo 12/11/2012    PCP: Myrlene Broker, MD  REFERRING PROVIDER: Judi Saa, DO  REFERRING DIAG: [id number] (ICD-10-CM) - Chronic pain of right knee  THERAPY DIAG:  Chronic pain of right knee  Stiffness of right knee, not elsewhere classified  Muscle weakness (generalized)  Rationale for Evaluation and Treatment: Rehabilitation  ONSET DATE: 03/31/2023  SUBJECTIVE:   SUBJECTIVE STATEMENT: I was sore after last time but it was good.  I felt good.  I tried wearing G defy shoes and they hurt my feet.    PERTINENT HISTORY: Knee scope approx 15 years ago (right knee) PAIN:  Are you having pain? Yes: NPRS scale: 4/10 Pain location: right > left Pain description: aching, sharp Aggravating factors: walking, standing, bending,stooping, squatting Relieving factors: rest, meds  PRECAUTIONS: Fall  RED FLAGS: None   WEIGHT BEARING RESTRICTIONS: No  FALLS:  Has patient fallen in last 6 months? No  LIVING ENVIRONMENT: Lives with: lives with their spouse Lives in: House/apartment Stairs: Yes: Internal: 12 steps; on right going up  and External: 5 steps; on right going up Has following equipment at home: None  OCCUPATION: retired  PLOF: Independent, Independent with basic ADLs, Independent with household mobility without device, Independent with community mobility without device, Independent with homemaking with ambulation, Independent with gait, and Independent with transfers  PATIENT GOALS: to be able to walk and get up and down from a chair and do her routine daily activities without pain and without her knee giving way  NEXT MD VISIT: prn  OBJECTIVE:  Note: Objective measures were completed at Evaluation unless otherwise noted.  DIAGNOSTIC FINDINGS:  05/30/22: RIGHT KNEE 3 VIEWS  COMPARISON:  Bilateral knee radiographs May 19th, 2015  FINDINGS: No  acute fracture or dislocation. No joint effusion. Mild lateral compartment subchondral sclerosis and cystic changes with mild osteophytosis. No soft tissue abnormalities.  IMPRESSION: Mild lateral compartment osteoarthritis.  07/28/22 Left knee: Resulted by: Judi Saa, DO Performed: 07/28/22 1411 - 07/28/22 1411  Accession number: [id number] Resulting lab: [facility]  Narrative: Limited muscular skeletal ultrasound was performed and interpreted by Antoine Primas, M Limited ultrasound continues to show the patient does have some hypoechoic changes in the patellofemoral joint noted.  Some degenerative changes of the meniscus noted. Impression: Improvement in inflammation but still continue effusion    PATIENT SURVEYS:  Eval: FOTO 38, predicted 55  COGNITION: Overall cognitive status: Within functional limits for tasks assessed     SENSATION: WFL  EDEMA:  Figure 8: measure next visit  POSTURE:  slight knee valgus bilaterally  PALPATION: Mod crepitus noted on seated open chain flexion/extension  LOWER EXTREMITY ROM:  WNL LOWER EXTREMITY MMT:  Generally 4/5 with exception of bilateral knee flexion 4-/5 and bilateral hip abd 4-/5 and bilateral hip ER 3+/5  LOWER EXTREMITY SPECIAL TESTS:  Knee special tests: Patellafemoral grind test: positive   FUNCTIONAL TESTS:  Eval: 5 times sit to stand: 24 sec Timed up and go (TUG): 14.5 sec  GAIT: Distance walked: 50 feet Assistive device utilized: None Level of assistance: Complete Independence Comments: antalgic, slow, right LE externally rotated, guarded, short step length   TODAY'S TREATMENT:    DATE: 04/26/2023 Recumbent bike level 1 x10 min with PT present to discuss status Seated LAQ 3# 20x right/left Seated hip flexion up and over cane on floor 3# ankle weights 15x right/left Leg press seat 8 bil 70# 15x; single leg 30# 15x right/left Seated green band HS curls 15x right/left   Side planks on tall  table 5x right/left  Leaning over on tall table: hip extension 15x right/left  DATE: 04/21/2023 Recumbent bike level 1 x7 min with PT present to discuss status 2nd step: knee flexion 10x right /left (pt states "this is scary") Standing 2nd step: hamstring stretch  2x20 sec bilat FWD step ups with 4" step ups with bilat UE support x10 right/left (very painful on right) Leg press seat 8 bil 70# 15x (6/10) single leg 30# 15x right/left Seated green band HS curls 15x right/left  ("I need this") Leaning over on tall table: hip extension 15x right/left ("I like this one.")  DATE: 04/18/2023 Recumbent bike level 1 x7 min with PT present to discuss status Standing hamstring stretch at step 2x20 sec bilat Education on stair negotiation with step to pattern.  Then negotiated 4 steps with step to pattern with bilat rails FWD step ups with 4" step ups with bilat UE support 2x10 bilat Heel tap down from 2" step with UE support x10 bilat Seated LAQ with 2# 2x10  bilat Seated with with 2# ankle weights:  side "step/lift" over low cone 2x10 bilat Sit to/from stand with elevated surface (foam pad on mat table) x5    DATE: 04/15/23 Bike 7 min no resistance   HR 111   O2 98% SLR 10x right/left Bridge 5x (small to avoid pressure on neck) Sidelying clam 10x right/left (challenged) Seated clam bil red band (easy);  Seated red band around thighs with floor sliders with abduction (more difficult on right) 10x right/left  LAQ 2# 20x right/left (more difficult on right) Seated red band HS curls with slider 10x right/left (has red band for home) Mat table + cushion sit to stand painful even with wider stance 6x Education on the benefits of ex for OA and strategy for unloaded positions first                    PATIENT EDUCATION:  Education details: Initiated HEP Person educated: Patient Education method: Programmer, multimedia, Facilities manager, Verbal cues, and Handouts Education comprehension: verbalized understanding,  returned demonstration, and verbal cues required  HOME EXERCISE PROGRAM: Access Code: [email or web address] URL: [email or web address] Date: 04/12/2023 Prepared by: Lavinia Sharps  Exercises - Long Sitting Quad Set  - 1 x daily - 7 x weekly - 3 sets - 10 reps - Supine Knee Extension Strengthening  - 1 x daily - 7 x weekly - 3 sets - 10 reps - Seated Long Arc Quad  - 1 x daily - 7 x weekly - 3 sets - 10 reps - Straight Leg Raise  - 1 x daily - 7 x weekly - 1 sets - 10 reps - Clamshell  - 1 x daily - 7 x weekly - 1 sets - 10 reps - Seated Hamstring Curl with Anchored Resistance  - 1 x daily - 7 x weekly - 1 sets - 10 reps - Standing Heel Raise with Support  - 1 x daily - 7 x weekly - 1 sets - 10 reps  ASSESSMENT:  CLINICAL IMPRESSION: Therapist providing verbal cues to optimize technique with exercises in order to achieve the greatest benefit.  Some crepitus on leg press but reports it is not painful. She reports less fear of the leg press with it feeling "safe" to do.  Continue with unloaded or low load strengthening targeting quads, HS and gluteals.     OBJECTIVE IMPAIRMENTS: Abnormal gait, decreased balance, decreased knowledge of use of DME, decreased mobility, difficulty walking, decreased strength, increased edema, increased fascial restrictions, increased muscle spasms, impaired flexibility, postural dysfunction, obesity, and pain.   ACTIVITY LIMITATIONS: lifting, bending, sitting, standing, squatting, sleeping, stairs, transfers, bed mobility, bathing, toileting, dressing, and caring for others  PARTICIPATION LIMITATIONS: meal prep, cleaning, laundry, driving, shopping, community activity, yard work, and church  PERSONAL FACTORS: Fitness, Time since onset of injury/illness/exacerbation, and 1-2 comorbidities: anxiety and obesity  are also affecting patient's functional outcome.   REHAB POTENTIAL: Fair due to possible mechanical meniscus injury or joint deterioration  CLINICAL  DECISION MAKING: Evolving/moderate complexity  EVALUATION COMPLEXITY: Moderate   GOALS: Goals reviewed with patient? Yes  SHORT TERM GOALS: Target date: 05/04/2023   Patient will be independent with initial HEP  Baseline: Goal status: Ongoing  2.  Pain report to be no greater than 4/10  Baseline:  Goal status: Ongoing   LONG TERM GOALS: Target date: 06/01/2023   Patient to be independent with advanced HEP  Baseline:  Goal status: INITIAL  2.  Patient to report pain no greater than 2/10  Baseline:  Goal status: INITIAL  3.  Patient to be able to bend, stoop and squat with pain no greater than 2/10  Baseline:  Goal status: INITIAL  4.  Patient to be able to ascend and descend steps without pain or no greater than 2/10  Baseline:  Goal status: INITIAL  5.  Patient to be able to sleep through the night  Baseline:  Goal status: INITIAL  6.  Patient to report 85% improvement in overall symptoms  Baseline:  Goal status: INITIAL   PLAN:  PT FREQUENCY: 1-2x/week  PT DURATION: 8 weeks  PLANNED INTERVENTIONS: 97110-Therapeutic exercises, 97530- Therapeutic activity, 97112- Neuromuscular re-education, 97535- Self Care, 16109- Manual therapy, [phone]- Gait training, [id number]- Aquatic Therapy, [phone]- Splinting, 97014- Electrical stimulation (unattended), [id number]- Electrical stimulation (manual), 97016- Vasopneumatic device, 97035- Ultrasound, 29562- Ionotophoresis 4mg /ml Dexamethasone, Patient/Family education, Balance training, Stair training, Taping, Dry Needling, Joint mobilization, Scar mobilization, Compression bandaging, Vestibular training, Visual/preceptual remediation/compensation, Cryotherapy, and Moist heat  PLAN FOR NEXT SESSION: Assess and progress HEP as indicated,quad, HS, glute strengthening, sit to stand with feet wider stance   Lavinia Sharps, PT 04/26/23 5:32 PM Phone: [phone] Fax: [phone]  [facility] [address] [place], [place] [address] Phone # [phone] Fax [phone]

## 2023-04-27 ENCOUNTER — Encounter: Payer: PPO | Admitting: Physical Therapy

## 2023-04-28 ENCOUNTER — Ambulatory Visit: Payer: PPO | Admitting: Physical Therapy

## 2023-04-28 DIAGNOSIS — M25661 Stiffness of right knee, not elsewhere classified: Secondary | ICD-10-CM

## 2023-04-28 DIAGNOSIS — M25561 Pain in right knee: Secondary | ICD-10-CM | POA: Diagnosis not present

## 2023-04-28 DIAGNOSIS — M6281 Muscle weakness (generalized): Secondary | ICD-10-CM

## 2023-04-28 DIAGNOSIS — G8929 Other chronic pain: Secondary | ICD-10-CM

## 2023-04-28 NOTE — Therapy (Signed)
OUTPATIENT PHYSICAL THERAPY TREATMENT NOTE   Patient Name: Jean Smith MRN: [id number] DOB:[birth year], 72 y.o., female Today's Date: 04/28/2023  END OF SESSION:  PT End of Session - 04/28/23 1240     Visit Number 7    Date for PT Re-Evaluation 06/01/23    Authorization Type [employer]    Progress Note Due on Visit 10    PT Start Time 1237    PT Stop Time 1315    PT Time Calculation (min) 38 min    Activity Tolerance Patient tolerated treatment well             Past Medical History:  Diagnosis Date   Allergy    Anxiety    Aortic regurgitation    moderate by echo 10/2020   Arthritis    Cancer (HCC)    skin   Cataract    bilateral - MD is just watching    Diverticulosis    Fuchs' endothelial dystrophy    follows with optho regularly    Gestational diabetes    Heart murmur    MVP    History of depression    HSV-2 infection    Hyperlipidemia    Hyperplastic colon polyp    Mitral valve prolapse    mild to moderate MR by echo 10/2020   PVC's (premature ventricular contractions)    intol of BB, sxc palpitations r/t stress   RVOT ventricular tachycardia/PVCs    EP eval 01/2013 for freq PVCs   Past Surgical History:  Procedure Laterality Date   CESAREAN SECTION     x 3   COLONOSCOPY     KNEE SURGERY Right    MANDIBLE SURGERY     right side in front of ear   MOHS SURGERY  2021   POLYPECTOMY     WISDOM TOOTH EXTRACTION     Patient Active Problem List   Diagnosis Date Noted   AC (acromioclavicular) arthritis 05/28/2022   Mitral valve prolapse    Aortic regurgitation    NSVT (nonsustained ventricular tachycardia) (HCC) 09/25/2020   Diabetes mellitus type 2 with complications (HCC) 08/27/2020   Chronic pain of both shoulders 05/05/2018   Other fatigue 01/02/2018   Allergic rhinitis 01/02/2018   Routine general medical examination at a health care facility 09/01/2015   Hyperlipidemia associated with type 2 diabetes mellitus (HCC) 09/01/2015    Varicose vein 08/27/2014   Primary localized osteoarthrosis, lower leg 10/30/2013   RVOT ventricular tachycardia/PVCs 01/31/2013   Abnormal stress echo 12/11/2012    PCP: Myrlene Broker, MD  REFERRING PROVIDER: Judi Saa, DO  REFERRING DIAG: [phone] (ICD-10-CM) - Chronic pain of right knee  THERAPY DIAG:  Chronic pain of right knee  Stiffness of right knee, not elsewhere classified  Muscle weakness (generalized)  Rationale for Evaluation and Treatment: Rehabilitation  ONSET DATE: 03/31/2023  SUBJECTIVE:   SUBJECTIVE STATEMENT: I'm here.  I'm hurting in my hip not my knee.  It woke me up.    PERTINENT HISTORY: Knee scope approx 15 years ago (right knee) PAIN:  Are you having pain? Yes: NPRS scale: 8/10 Pain location: right > left Pain description: aching, sharp Aggravating factors: walking, standing, bending,stooping, squatting Relieving factors: rest, meds  PRECAUTIONS: Fall  RED FLAGS: None   WEIGHT BEARING RESTRICTIONS: No  FALLS:  Has patient fallen in last 6 months? No  LIVING ENVIRONMENT: Lives with: lives with their spouse Lives in: House/apartment Stairs: Yes: Internal: 12 steps; on right going up and External:  5 steps; on right going up Has following equipment at home: None  OCCUPATION: retired  PLOF: Independent, Independent with basic ADLs, Independent with household mobility without device, Independent with community mobility without device, Independent with homemaking with ambulation, Independent with gait, and Independent with transfers  PATIENT GOALS: to be able to walk and get up and down from a chair and do her routine daily activities without pain and without her knee giving way  NEXT MD VISIT: prn  OBJECTIVE:  Note: Objective measures were completed at Evaluation unless otherwise noted.  DIAGNOSTIC FINDINGS:  05/30/22: RIGHT KNEE 3 VIEWS  COMPARISON:  Bilateral knee radiographs May 19th, 2015  FINDINGS: No  acute fracture or dislocation. No joint effusion. Mild lateral compartment subchondral sclerosis and cystic changes with mild osteophytosis. No soft tissue abnormalities.  IMPRESSION: Mild lateral compartment osteoarthritis.  07/28/22 Left knee: Resulted by: Judi Saa, DO Performed: 07/28/22 1411 - 07/28/22 1411  Accession number: [id number] Resulting lab: [facility]  Narrative: Limited muscular skeletal ultrasound was performed and interpreted by Antoine Primas, M Limited ultrasound continues to show the patient does have some hypoechoic changes in the patellofemoral joint noted.  Some degenerative changes of the meniscus noted. Impression: Improvement in inflammation but still continue effusion    PATIENT SURVEYS:  Eval: FOTO 38, predicted 55  COGNITION: Overall cognitive status: Within functional limits for tasks assessed     SENSATION: WFL  EDEMA:  Figure 8: measure next visit  POSTURE:  slight knee valgus bilaterally  PALPATION: Mod crepitus noted on seated open chain flexion/extension  LOWER EXTREMITY ROM:  WNL LOWER EXTREMITY MMT:  Generally 4/5 with exception of bilateral knee flexion 4-/5 and bilateral hip abd 4-/5 and bilateral hip ER 3+/5  LOWER EXTREMITY SPECIAL TESTS:  Knee special tests: Patellafemoral grind test: positive   FUNCTIONAL TESTS:  Eval: 5 times sit to stand: 24 sec Timed up and go (TUG): 14.5 sec  GAIT: Distance walked: 50 feet Assistive device utilized: None Level of assistance: Complete Independence Comments: antalgic, slow, right LE externally rotated, guarded, short step length   TODAY'S TREATMENT:    DATE: 04/28/2023 Recumbent bike level 1 x10 min with PT present to discuss status Seated LAQ 3# 2x 10 right/left (feels hard but try 4# next visit with fewer reps) Leg press seat 8 bil 75# 20x; single leg 35# 12x right/left Seated green band HS curls with floor slider 18x right/left   Leaning over on tall table  on elbows:floor sliders hip abduction and hip extension 10x right/left ("this is unfamiliar and I'm worried it will hurt my knee- but it doesn't") DATE: 04/26/2023 Recumbent bike level 1 x10 min with PT present to discuss status Seated LAQ 3# 20x right/left Seated hip flexion up and over cane on floor 3# ankle weights 15x right/left Leg press seat 8 bil 70# 15x; single leg 30# 15x right/left Seated green band HS curls 15x right/left   Side planks on tall table 5x right/left  Leaning over on tall table: hip extension 15x right/left  DATE: 04/21/2023 Recumbent bike level 1 x7 min with PT present to discuss status 2nd step: knee flexion 10x right /left (pt states "this is scary") Standing 2nd step: hamstring stretch  2x20 sec bilat FWD step ups with 4" step ups with bilat UE support x10 right/left (very painful on right) Leg press seat 8 bil 70# 15x (6/10) single leg 30# 15x right/left Seated green band HS curls 15x right/left  ("I need this") Leaning over on tall  table: hip extension 15x right/left ("I like this one.")  DATE: 04/18/2023 Recumbent bike level 1 x7 min with PT present to discuss status Standing hamstring stretch at step 2x20 sec bilat Education on stair negotiation with step to pattern.  Then negotiated 4 steps with step to pattern with bilat rails FWD step ups with 4" step ups with bilat UE support 2x10 bilat Heel tap down from 2" step with UE support x10 bilat Seated LAQ with 2# 2x10 bilat Seated with with 2# ankle weights:  side "step/lift" over low cone 2x10 bilat Sit to/from stand with elevated surface (foam pad on mat table) x5    DATE: 04/15/23 Bike 7 min no resistance   HR 111   O2 98% SLR 10x right/left Bridge 5x (small to avoid pressure on neck) Sidelying clam 10x right/left (challenged) Seated clam bil red band (easy);  Seated red band around thighs with floor sliders with abduction (more difficult on right) 10x right/left  LAQ 2# 20x right/left (more difficult  on right) Seated red band HS curls with slider 10x right/left (has red band for home) Mat table + cushion sit to stand painful even with wider stance 6x Education on the benefits of ex for OA and strategy for unloaded positions first                    PATIENT EDUCATION:  Education details: Initiated HEP Person educated: Patient Education method: Programmer, multimedia, Facilities manager, Verbal cues, and Handouts Education comprehension: verbalized understanding, returned demonstration, and verbal cues required  HOME EXERCISE PROGRAM: Access Code: [email or web address] URL: [email or web address] Date: 04/12/2023 Prepared by: Lavinia Sharps  Exercises - Long Sitting Quad Set  - 1 x daily - 7 x weekly - 3 sets - 10 reps - Supine Knee Extension Strengthening  - 1 x daily - 7 x weekly - 3 sets - 10 reps - Seated Long Arc Quad  - 1 x daily - 7 x weekly - 3 sets - 10 reps - Straight Leg Raise  - 1 x daily - 7 x weekly - 1 sets - 10 reps - Clamshell  - 1 x daily - 7 x weekly - 1 sets - 10 reps - Seated Hamstring Curl with Anchored Resistance  - 1 x daily - 7 x weekly - 1 sets - 10 reps - Standing Heel Raise with Support  - 1 x daily - 7 x weekly - 1 sets - 10 reps  ASSESSMENT:  CLINICAL IMPRESSION:   Therapist providing verbal cues to optimize technique with  exercises in order to achieve the greatest benefit.  Therapist closely monitoring pain in both hip and knee throughout session.  Although she reports concern that the exercises will be painful, she denies pain while doing them and feels the exercises are helpful to her muscles.  May increase resistance but will keep number of repetitions low.      OBJECTIVE IMPAIRMENTS: Abnormal gait, decreased balance, decreased knowledge of use of DME, decreased mobility, difficulty walking, decreased strength, increased edema, increased fascial restrictions, increased muscle spasms, impaired flexibility, postural dysfunction, obesity, and pain.   ACTIVITY  LIMITATIONS: lifting, bending, sitting, standing, squatting, sleeping, stairs, transfers, bed mobility, bathing, toileting, dressing, and caring for others  PARTICIPATION LIMITATIONS: meal prep, cleaning, laundry, driving, shopping, community activity, yard work, and church  PERSONAL FACTORS: Fitness, Time since onset of injury/illness/exacerbation, and 1-2 comorbidities: anxiety and obesity  are also affecting patient's functional outcome.   REHAB POTENTIAL: Fair due to possible mechanical meniscus injury  or joint deterioration  CLINICAL DECISION MAKING: Evolving/moderate complexity  EVALUATION COMPLEXITY: Moderate   GOALS: Goals reviewed with patient? Yes  SHORT TERM GOALS: Target date: 05/04/2023   Patient will be independent with initial HEP  Baseline: Goal status: Ongoing  2.  Pain report to be no greater than 4/10  Baseline:  Goal status: Ongoing   LONG TERM GOALS: Target date: 06/01/2023   Patient to be independent with advanced HEP  Baseline:  Goal status: INITIAL  2.  Patient to report pain no greater than 2/10  Baseline:  Goal status: INITIAL  3.  Patient to be able to bend, stoop and squat with pain no greater than 2/10  Baseline:  Goal status: INITIAL  4.  Patient to be able to ascend and descend steps without pain or no greater than 2/10  Baseline:  Goal status: INITIAL  5.  Patient to be able to sleep through the night  Baseline:  Goal status: INITIAL  6.  Patient to report 85% improvement in overall symptoms  Baseline:  Goal status: INITIAL   PLAN:  PT FREQUENCY: 1-2x/week  PT DURATION: 8 weeks  PLANNED INTERVENTIONS: 97110-Therapeutic exercises, 97530- Therapeutic activity, 97112- Neuromuscular re-education, 97535- Self Care, 36644- Manual therapy, [phone]- Gait training, [phone]- Aquatic Therapy, [phone]- Splinting, 97014- Electrical stimulation (unattended), [phone]- Electrical stimulation (manual), 97016- Vasopneumatic device, [id number]-  Ultrasound, [id number]- Ionotophoresis 4mg /ml Dexamethasone, Patient/Family education, Balance training, Stair training, Taping, Dry Needling, Joint mobilization, Scar mobilization, Compression bandaging, Vestibular training, Visual/preceptual remediation/compensation, Cryotherapy, and Moist heat  PLAN FOR NEXT SESSION: Assess and progress HEP as indicated,quad, HS, glute strengthening in unloaded positions initially and will work up to weight bearing over time   Lavinia Sharps, PT 04/28/23 5:37 PM Phone: [phone] Fax: [phone]  [facility] [address] [place], [place] [address] Phone # [phone] Fax [phone]

## 2023-05-03 ENCOUNTER — Encounter: Payer: Self-pay | Admitting: Rehabilitative and Restorative Service Providers"

## 2023-05-03 ENCOUNTER — Ambulatory Visit: Payer: PPO | Admitting: Rehabilitative and Restorative Service Providers"

## 2023-05-03 DIAGNOSIS — G8929 Other chronic pain: Secondary | ICD-10-CM

## 2023-05-03 DIAGNOSIS — M25561 Pain in right knee: Secondary | ICD-10-CM | POA: Diagnosis not present

## 2023-05-03 DIAGNOSIS — M6281 Muscle weakness (generalized): Secondary | ICD-10-CM

## 2023-05-03 DIAGNOSIS — M25661 Stiffness of right knee, not elsewhere classified: Secondary | ICD-10-CM

## 2023-05-03 DIAGNOSIS — R252 Cramp and spasm: Secondary | ICD-10-CM

## 2023-05-03 DIAGNOSIS — R262 Difficulty in walking, not elsewhere classified: Secondary | ICD-10-CM

## 2023-05-03 NOTE — Therapy (Signed)
OUTPATIENT PHYSICAL THERAPY TREATMENT NOTE   Patient Name: Jean Smith MRN: [id number] DOB:1951-02-14, 72 y.o., female Today's Date: 05/03/2023  END OF SESSION:  PT End of Session - 05/03/23 1021     Visit Number 8    Date for PT Re-Evaluation 06/01/23    Authorization Type [employer]    Progress Note Due on Visit 10    PT Start Time 1018    PT Stop Time 1056    PT Time Calculation (min) 38 min    Activity Tolerance Patient tolerated treatment well    Behavior During Therapy WFL for tasks assessed/performed             Past Medical History:  Diagnosis Date   Allergy    Anxiety    Aortic regurgitation    moderate by echo 10/2020   Arthritis    Cancer (HCC)    skin   Cataract    bilateral - MD is just watching    Diverticulosis    Fuchs' endothelial dystrophy    follows with optho regularly    Gestational diabetes    Heart murmur    MVP    History of depression    HSV-2 infection    Hyperlipidemia    Hyperplastic colon polyp    Mitral valve prolapse    mild to moderate MR by echo 10/2020   PVC's (premature ventricular contractions)    intol of BB, sxc palpitations r/t stress   RVOT ventricular tachycardia/PVCs    EP eval 01/2013 for freq PVCs   Past Surgical History:  Procedure Laterality Date   CESAREAN SECTION     x 3   COLONOSCOPY     KNEE SURGERY Right    MANDIBLE SURGERY     right side in front of ear   MOHS SURGERY  2021   POLYPECTOMY     WISDOM TOOTH EXTRACTION     Patient Active Problem List   Diagnosis Date Noted   AC (acromioclavicular) arthritis 05/28/2022   Mitral valve prolapse    Aortic regurgitation    NSVT (nonsustained ventricular tachycardia) (HCC) 09/25/2020   Diabetes mellitus type 2 with complications (HCC) 08/27/2020   Chronic pain of both shoulders 05/05/2018   Other fatigue 01/02/2018   Allergic rhinitis 01/02/2018   Routine general medical examination at a health care facility 09/01/2015    Hyperlipidemia associated with type 2 diabetes mellitus (HCC) 09/01/2015   Varicose vein 08/27/2014   Primary localized osteoarthrosis, lower leg 10/30/2013   RVOT ventricular tachycardia/PVCs 01/31/2013   Abnormal stress echo 12/11/2012    PCP: Myrlene Broker, MD  REFERRING PROVIDER: Judi Saa, DO  REFERRING DIAG: [phone] (ICD-10-CM) - Chronic pain of right knee  THERAPY DIAG:  Chronic pain of right knee  Stiffness of right knee, not elsewhere classified  Muscle weakness (generalized)  Cramp and spasm  Difficulty in walking, not elsewhere classified  Rationale for Evaluation and Treatment: Rehabilitation  ONSET DATE: 03/31/2023  SUBJECTIVE:   SUBJECTIVE STATEMENT: Pt reports that she is still having some hip and knee pain.  States that her hip is the pain that wakes her up.  PERTINENT HISTORY: Knee scope approx 15 years ago (right knee) PAIN:  Are you having pain? Yes: NPRS scale: 5/10 Pain location: right > left Pain description: aching, sharp Aggravating factors: walking, standing, bending,stooping, squatting Relieving factors: rest, meds  PRECAUTIONS: Fall  RED FLAGS: None   WEIGHT BEARING RESTRICTIONS: No  FALLS:  Has patient fallen in  last 6 months? No  LIVING ENVIRONMENT: Lives with: lives with their spouse Lives in: House/apartment Stairs: Yes: Internal: 12 steps; on right going up and External: 5 steps; on right going up Has following equipment at home: None  OCCUPATION: retired  PLOF: Independent, Independent with basic ADLs, Independent with household mobility without device, Independent with community mobility without device, Independent with homemaking with ambulation, Independent with gait, and Independent with transfers  PATIENT GOALS: to be able to walk and get up and down from a chair and do her routine daily activities without pain and without her knee giving way  NEXT MD VISIT: prn  OBJECTIVE:  Note: Objective  measures were completed at Evaluation unless otherwise noted.  DIAGNOSTIC FINDINGS:  05/30/22: RIGHT KNEE 3 VIEWS  COMPARISON:  Bilateral knee radiographs May 19th, 2015  FINDINGS: No acute fracture or dislocation. No joint effusion. Mild lateral compartment subchondral sclerosis and cystic changes with mild osteophytosis. No soft tissue abnormalities.  IMPRESSION: Mild lateral compartment osteoarthritis.  07/28/22 Left knee: Resulted by: Judi Saa, DO Performed: 07/28/22 1411 - 07/28/22 1411  Accession number: [id number] Resulting lab: [facility]  Narrative: Limited muscular skeletal ultrasound was performed and interpreted by Antoine Primas, M Limited ultrasound continues to show the patient does have some hypoechoic changes in the patellofemoral joint noted.  Some degenerative changes of the meniscus noted. Impression: Improvement in inflammation but still continue effusion    PATIENT SURVEYS:  Eval: FOTO 38, predicted 55  COGNITION: Overall cognitive status: Within functional limits for tasks assessed     SENSATION: WFL   POSTURE:  slight knee valgus bilaterally  PALPATION: Mod crepitus noted on seated open chain flexion/extension  LOWER EXTREMITY ROM:  WNL LOWER EXTREMITY MMT:  Generally 4/5 with exception of bilateral knee flexion 4-/5 and bilateral hip abd 4-/5 and bilateral hip ER 3+/5  LOWER EXTREMITY SPECIAL TESTS:  Knee special tests: Patellafemoral grind test: positive   FUNCTIONAL TESTS:  Eval: 5 times sit to stand: 24 sec Timed up and go (TUG): 14.5 sec  05/03/2023: 5 times sit to stand:  18.36 sec without UE use Timed up and go (TUG): 8.00 sec 3 minute walk test: 612 ft without increased pain with RPE of 3/10  GAIT: Distance walked: 50 feet Assistive device utilized: None Level of assistance: Complete Independence Comments: antalgic, slow, right LE externally rotated, guarded, short step length   TODAY'S TREATMENT:      DATE: 05/03/2023 Recumbent bike level 2, x6 min with PT present to discuss status Functional tests:  5 times sit to stand and TUG 3 minute walk test: 612 ft Seated with 4# ankle weight:  LAQ, marching. 2x7 each bilat Seated hamstring curls with green tband 2x10 bilat Leg Press (seat at 8) 80# 2x10 Single Leg Press (seat 8) 40# 2x10 bilat Standing at counter:  hip abduction and hip extension with foot on slider.  X10 each bilat   DATE: 04/28/2023 Recumbent bike level 1 x10 min with PT present to discuss status Seated LAQ 3# 2x 10 right/left (feels hard but try 4# next visit with fewer reps) Leg press seat 8 bil 75# 20x; single leg 35# 12x right/left Seated green band HS curls with floor slider 18x right/left   Leaning over on tall table on elbows:floor sliders hip abduction and hip extension 10x right/left ("this is unfamiliar and I'm worried it will hurt my knee- but it doesn't")   DATE: 04/26/2023 Recumbent bike level 1 x10 min with PT present to discuss  status Seated LAQ 3# 20x right/left Seated hip flexion up and over cane on floor 3# ankle weights 15x right/left Leg press seat 8 bil 70# 15x; single leg 30# 15x right/left Seated green band HS curls 15x right/left   Side planks on tall table 5x right/left  Leaning over on tall table: hip extension 15x right/left      PATIENT EDUCATION:  Education details: Initiated HEP Person educated: Patient Education method: Programmer, multimedia, Facilities manager, Verbal cues, and Handouts Education comprehension: verbalized understanding, returned demonstration, and verbal cues required  HOME EXERCISE PROGRAM: Access Code: [email or web address] URL: [email or web address] Date: 04/12/2023 Prepared by: Lavinia Sharps  Exercises - Long Sitting Quad Set  - 1 x daily - 7 x weekly - 3 sets - 10 reps - Supine Knee Extension Strengthening  - 1 x daily - 7 x weekly - 3 sets - 10 reps - Seated Long Arc Quad  - 1 x daily - 7 x weekly - 3 sets - 10  reps - Straight Leg Raise  - 1 x daily - 7 x weekly - 1 sets - 10 reps - Clamshell  - 1 x daily - 7 x weekly - 1 sets - 10 reps - Seated Hamstring Curl with Anchored Resistance  - 1 x daily - 7 x weekly - 1 sets - 10 reps - Standing Heel Raise with Support  - 1 x daily - 7 x weekly - 1 sets - 10 reps  ASSESSMENT:  CLINICAL IMPRESSION: Ms Sebastian presents to skilled PT reporting that she is still having the most pain with negotiation of the stairs.  Patient continues to be able to progress with exercises in clinic without any complaints of increased pain, only muscle fatigue.  Pt states that she can feel that the exercises are working the appropriate muscles and that she is getting stronger.  Patient able to complete new functional tests and has made great progress in those.  Additionally, able to complete a 3 minute walk test today to establish a baseline measurement.  Patient continues to progress towards goal related activities.    OBJECTIVE IMPAIRMENTS: Abnormal gait, decreased balance, decreased knowledge of use of DME, decreased mobility, difficulty walking, decreased strength, increased edema, increased fascial restrictions, increased muscle spasms, impaired flexibility, postural dysfunction, obesity, and pain.   ACTIVITY LIMITATIONS: lifting, bending, sitting, standing, squatting, sleeping, stairs, transfers, bed mobility, bathing, toileting, dressing, and caring for others  PARTICIPATION LIMITATIONS: meal prep, cleaning, laundry, driving, shopping, community activity, yard work, and church  PERSONAL FACTORS: Fitness, Time since onset of injury/illness/exacerbation, and 1-2 comorbidities: anxiety and obesity  are also affecting patient's functional outcome.   REHAB POTENTIAL: Fair due to possible mechanical meniscus injury or joint deterioration  CLINICAL DECISION MAKING: Evolving/moderate complexity  EVALUATION COMPLEXITY: Moderate   GOALS: Goals reviewed with patient?  Yes  SHORT TERM GOALS: Target date: 05/04/2023   Patient will be independent with initial HEP  Baseline: Goal status: MET on 05/03/2023  2.  Pain report to be no greater than 4/10  Baseline:  Goal status: Ongoing   LONG TERM GOALS: Target date: 06/01/2023   Patient to be independent with advanced HEP  Baseline:  Goal status: Ongoing  2.  Patient to report pain no greater than 2/10  Baseline:  Goal status: INITIAL  3.  Patient to be able to bend, stoop and squat with pain no greater than 2/10  Baseline:  Goal status: INITIAL  4.  Patient to be able to ascend and descend steps without pain  or no greater than 2/10  Baseline:  Goal status: Ongoing  5.  Patient to be able to sleep through the night  Baseline:  Goal status: Ongoing  6.  Patient to report 85% improvement in overall symptoms  Baseline:  Goal status: INITIAL   PLAN:  PT FREQUENCY: 1-2x/week  PT DURATION: 8 weeks  PLANNED INTERVENTIONS: 97110-Therapeutic exercises, 97530- Therapeutic activity, [id number]- Neuromuscular re-education, 97535- Self Care, 57846- Manual therapy, [phone]- Gait training, [phone]- Aquatic Therapy, [phone]- Splinting, 97014- Electrical stimulation (unattended), [phone]- Electrical stimulation (manual), 97016- Vasopneumatic device, [id number]- Ultrasound, [id number]- Ionotophoresis 4mg /ml Dexamethasone, Patient/Family education, Balance training, Stair training, Taping, Dry Needling, Joint mobilization, Scar mobilization, Compression bandaging, Vestibular training, Visual/preceptual remediation/compensation, Cryotherapy, and Moist heat  PLAN FOR NEXT SESSION: Assess and progress HEP as indicated,quad, HS, glute strengthening in unloaded positions initially and will work up to weight bearing over time   Reather Laurence, PT, DPT 05/03/23, 11:02 AM  [facility] [address], [address] [place], [place] [address] Phone # [phone] Fax [phone]

## 2023-05-05 ENCOUNTER — Ambulatory Visit: Payer: PPO | Admitting: Physical Therapy

## 2023-05-05 DIAGNOSIS — M25561 Pain in right knee: Secondary | ICD-10-CM | POA: Diagnosis not present

## 2023-05-05 DIAGNOSIS — M6281 Muscle weakness (generalized): Secondary | ICD-10-CM

## 2023-05-05 DIAGNOSIS — G8929 Other chronic pain: Secondary | ICD-10-CM

## 2023-05-05 DIAGNOSIS — M25661 Stiffness of right knee, not elsewhere classified: Secondary | ICD-10-CM

## 2023-05-05 NOTE — Therapy (Signed)
OUTPATIENT PHYSICAL THERAPY TREATMENT NOTE   Patient Name: Jean Smith MRN: [id number] DOB:06-07-1951, 72 y.o., female Today's Date: 05/05/2023  END OF SESSION:  PT End of Session - 05/05/23 1018     Visit Number 9    Date for PT Re-Evaluation 06/01/23    Authorization Type [employer]    Progress Note Due on Visit 10    PT Start Time 1018    PT Stop Time 1057    PT Time Calculation (min) 39 min    Activity Tolerance Patient tolerated treatment well             Past Medical History:  Diagnosis Date   Allergy    Anxiety    Aortic regurgitation    moderate by echo 10/2020   Arthritis    Cancer (HCC)    skin   Cataract    bilateral - MD is just watching    Diverticulosis    Fuchs' endothelial dystrophy    follows with optho regularly    Gestational diabetes    Heart murmur    MVP    History of depression    HSV-2 infection    Hyperlipidemia    Hyperplastic colon polyp    Mitral valve prolapse    mild to moderate MR by echo 10/2020   PVC's (premature ventricular contractions)    intol of BB, sxc palpitations r/t stress   RVOT ventricular tachycardia/PVCs    EP eval 01/2013 for freq PVCs   Past Surgical History:  Procedure Laterality Date   CESAREAN SECTION     x 3   COLONOSCOPY     KNEE SURGERY Right    MANDIBLE SURGERY     right side in front of ear   MOHS SURGERY  2021   POLYPECTOMY     WISDOM TOOTH EXTRACTION     Patient Active Problem List   Diagnosis Date Noted   AC (acromioclavicular) arthritis 05/28/2022   Mitral valve prolapse    Aortic regurgitation    NSVT (nonsustained ventricular tachycardia) (HCC) 09/25/2020   Diabetes mellitus type 2 with complications (HCC) 08/27/2020   Chronic pain of both shoulders 05/05/2018   Other fatigue 01/02/2018   Allergic rhinitis 01/02/2018   Routine general medical examination at a health care facility 09/01/2015   Hyperlipidemia associated with type 2 diabetes mellitus (HCC) 09/01/2015    Varicose vein 08/27/2014   Primary localized osteoarthrosis, lower leg 10/30/2013   RVOT ventricular tachycardia/PVCs 01/31/2013   Abnormal stress echo 12/11/2012    PCP: Myrlene Broker, MD  REFERRING PROVIDER: Judi Saa, DO  REFERRING DIAG: [phone] (ICD-10-CM) - Chronic pain of right knee  THERAPY DIAG:  Chronic pain of right knee  Stiffness of right knee, not elsewhere classified  Muscle weakness (generalized)  Rationale for Evaluation and Treatment: Rehabilitation  ONSET DATE: 03/31/2023  SUBJECTIVE:   SUBJECTIVE STATEMENT: I slept 6 hours and the hip didn't wake me up.  Steps are still difficult. I'm going to get on my bike this weekend!  PERTINENT HISTORY: Knee scope approx 15 years ago (right knee) PAIN:  Are you having pain? Yes: NPRS scale: 5/10 Pain location: right > left Pain description: aching, sharp Aggravating factors: walking, standing, bending,stooping, squatting Relieving factors: rest, meds  PRECAUTIONS: Fall  RED FLAGS: None   WEIGHT BEARING RESTRICTIONS: No  FALLS:  Has patient fallen in last 6 months? No  LIVING ENVIRONMENT: Lives with: lives with their spouse Lives in: House/apartment Stairs: Yes: Internal: 12  steps; on right going up and External: 5 steps; on right going up Has following equipment at home: None  OCCUPATION: retired  PLOF: Independent, Independent with basic ADLs, Independent with household mobility without device, Independent with community mobility without device, Independent with homemaking with ambulation, Independent with gait, and Independent with transfers  PATIENT GOALS: to be able to walk and get up and down from a chair and do her routine daily activities without pain and without her knee giving way  NEXT MD VISIT: prn  OBJECTIVE:  Note: Objective measures were completed at Evaluation unless otherwise noted.  DIAGNOSTIC FINDINGS:  05/30/22: RIGHT KNEE 3 VIEWS  COMPARISON:   Bilateral knee radiographs May 19th, 2015  FINDINGS: No acute fracture or dislocation. No joint effusion. Mild lateral compartment subchondral sclerosis and cystic changes with mild osteophytosis. No soft tissue abnormalities.  IMPRESSION: Mild lateral compartment osteoarthritis.  07/28/22 Left knee: Resulted by: Judi Saa, DO Performed: 07/28/22 1411 - 07/28/22 1411  Accession number: [id number] Resulting lab: [facility]  Narrative: Limited muscular skeletal ultrasound was performed and interpreted by Antoine Primas, M Limited ultrasound continues to show the patient does have some hypoechoic changes in the patellofemoral joint noted.  Some degenerative changes of the meniscus noted. Impression: Improvement in inflammation but still continue effusion    PATIENT SURVEYS:  Eval: FOTO 38, predicted 55  COGNITION: Overall cognitive status: Within functional limits for tasks assessed     SENSATION: WFL   POSTURE:  slight knee valgus bilaterally  PALPATION: Mod crepitus noted on seated open chain flexion/extension  LOWER EXTREMITY ROM:  WNL LOWER EXTREMITY MMT:  Generally 4/5 with exception of bilateral knee flexion 4-/5 and bilateral hip abd 4-/5 and bilateral hip ER 3+/5  LOWER EXTREMITY SPECIAL TESTS:  Knee special tests: Patellafemoral grind test: positive   FUNCTIONAL TESTS:  Eval: 5 times sit to stand: 24 sec Timed up and go (TUG): 14.5 sec  05/03/2023: 5 times sit to stand:  18.36 sec without UE use Timed up and go (TUG): 8.00 sec 3 minute walk test: 612 ft without increased pain with RPE of 3/10  GAIT: Distance walked: 50 feet Assistive device utilized: None Level of assistance: Complete Independence Comments: antalgic, slow, right LE externally rotated, guarded, short step length   TODAY'S TREATMENT:    DATE: 05/05/2023 Recumbent bike level 1 x 10 min with PT present to discuss status Seated with 4# ankle weight:  LAQ, marching.  2x10 each bilat Seated hamstring curls with green tband 2x10 bilat 2 inch step ups 5x right/left (right 4/10 pain) 2 inch lateral step ups 5x right/left (feels wobbly balance- wise on right) Leg Press (seat at 8) 80# 10x 2 Single Leg Press (seat 8) 40# 15x bilat Standing at railing:  donkey kick (bent knee hip extension) and fire hydrants X10 each bilat  DATE: 05/03/2023 Recumbent bike level 2, x6 min with PT present to discuss status Functional tests:  5 times sit to stand and TUG 3 minute walk test: 612 ft Seated with 4# ankle weight:  LAQ, marching. 2x7 each bilat Seated hamstring curls with green tband 2x10 bilat Leg Press (seat at 8) 80# 2x10 Single Leg Press (seat 8) 40# 2x10 bilat Standing at counter:  hip abduction and hip extension with foot on slider.  X10 each bilat   DATE: 04/28/2023 Recumbent bike level 1 x10 min with PT present to discuss status Seated LAQ 3# 2x 10 right/left (feels hard but try 4# next visit with fewer reps)  Leg press seat 8 bil 75# 20x; single leg 35# 12x right/left Seated green band HS curls with floor slider 18x right/left   Leaning over on tall table on elbows:floor sliders hip abduction and hip extension 10x right/left ("this is unfamiliar and I'm worried it will hurt my knee- but it doesn't")   DATE: 04/26/2023 Recumbent bike level 1 x10 min with PT present to discuss status Seated LAQ 3# 20x right/left Seated hip flexion up and over cane on floor 3# ankle weights 15x right/left Leg press seat 8 bil 70# 15x; single leg 30# 15x right/left Seated green band HS curls 15x right/left   Side planks on tall table 5x right/left  Leaning over on tall table: hip extension 15x right/left      PATIENT EDUCATION:  Education details: Initiated HEP Person educated: Patient Education method: Programmer, multimedia, Facilities manager, Verbal cues, and Handouts Education comprehension: verbalized understanding, returned demonstration, and verbal cues required  HOME  EXERCISE PROGRAM: Access Code: [email or web address] URL: [email or web address] Date: 04/12/2023 Prepared by: Lavinia Sharps  Exercises - Long Sitting Quad Set  - 1 x daily - 7 x weekly - 3 sets - 10 reps - Supine Knee Extension Strengthening  - 1 x daily - 7 x weekly - 3 sets - 10 reps - Seated Long Arc Quad  - 1 x daily - 7 x weekly - 3 sets - 10 reps - Straight Leg Raise  - 1 x daily - 7 x weekly - 1 sets - 10 reps - Clamshell  - 1 x daily - 7 x weekly - 1 sets - 10 reps - Seated Hamstring Curl with Anchored Resistance  - 1 x daily - 7 x weekly - 1 sets - 10 reps - Standing Heel Raise with Support  - 1 x daily - 7 x weekly - 1 sets - 10 reps  ASSESSMENT:  CLINICAL IMPRESSION: Able to add small step ups in limited volume with pain level 4/10 on right (tolerable per pt report).  Some discomfort on leg press as well but again she feels the pain is at an acceptable level.  Therapist providing verbal cues to optimize technique with  exercises in order to achieve the greatest benefit.  Will follow up her plan for riding her recumbent bike this weekend, patient states this will help to hold her accountable.     OBJECTIVE IMPAIRMENTS: Abnormal gait, decreased balance, decreased knowledge of use of DME, decreased mobility, difficulty walking, decreased strength, increased edema, increased fascial restrictions, increased muscle spasms, impaired flexibility, postural dysfunction, obesity, and pain.   ACTIVITY LIMITATIONS: lifting, bending, sitting, standing, squatting, sleeping, stairs, transfers, bed mobility, bathing, toileting, dressing, and caring for others  PARTICIPATION LIMITATIONS: meal prep, cleaning, laundry, driving, shopping, community activity, yard work, and church  PERSONAL FACTORS: Fitness, Time since onset of injury/illness/exacerbation, and 1-2 comorbidities: anxiety and obesity  are also affecting patient's functional outcome.   REHAB POTENTIAL: Fair due to possible  mechanical meniscus injury or joint deterioration  CLINICAL DECISION MAKING: Evolving/moderate complexity  EVALUATION COMPLEXITY: Moderate   GOALS: Goals reviewed with patient? Yes  SHORT TERM GOALS: Target date: 05/04/2023   Patient will be independent with initial HEP  Baseline: Goal status: MET on 05/03/2023  2.  Pain report to be no greater than 4/10  Baseline:  Goal status: Ongoing   LONG TERM GOALS: Target date: 06/01/2023   Patient to be independent with advanced HEP  Baseline:  Goal status: Ongoing  2.  Patient to report pain no greater than  2/10  Baseline:  Goal status: INITIAL  3.  Patient to be able to bend, stoop and squat with pain no greater than 2/10  Baseline:  Goal status: INITIAL  4.  Patient to be able to ascend and descend steps without pain or no greater than 2/10  Baseline:  Goal status: Ongoing  5.  Patient to be able to sleep through the night  Baseline:  Goal status: Ongoing  6.  Patient to report 85% improvement in overall symptoms  Baseline:  Goal status: INITIAL   PLAN:  PT FREQUENCY: 1-2x/week  PT DURATION: 8 weeks  PLANNED INTERVENTIONS: 97110-Therapeutic exercises, 97530- Therapeutic activity, 97112- Neuromuscular re-education, 97535- Self Care, 52841- Manual therapy, [phone]- Gait training, [phone]- Aquatic Therapy, [phone]- Splinting, 97014- Electrical stimulation (unattended), [phone]- Electrical stimulation (manual), 97016- Vasopneumatic device, [id number]- Ultrasound, [id number]- Ionotophoresis 4mg /ml Dexamethasone, Patient/Family education, Balance training, Stair training, Taping, Dry Needling, Joint mobilization, Scar mobilization, Compression bandaging, Vestibular training, Visual/preceptual remediation/compensation, Cryotherapy, and Moist heat  PLAN FOR NEXT SESSION: 10th visit progress note; FOTO; see how recumbent bike went over the weekend; Assess and progress HEP as indicated,quad, HS, glute strengthening in unloaded positions  initially and will work up to weight bearing over time   Lavinia Sharps, PT 05/05/23 5:22 PM Phone: [phone] Fax: [phone]   [facility] [address], [address] [place], [place] [address] Phone # [phone] Fax [phone]

## 2023-05-09 ENCOUNTER — Ambulatory Visit: Payer: PPO | Admitting: Rehabilitative and Restorative Service Providers"

## 2023-05-09 ENCOUNTER — Encounter: Payer: Self-pay | Admitting: Rehabilitative and Restorative Service Providers"

## 2023-05-09 DIAGNOSIS — G8929 Other chronic pain: Secondary | ICD-10-CM

## 2023-05-09 DIAGNOSIS — M25661 Stiffness of right knee, not elsewhere classified: Secondary | ICD-10-CM

## 2023-05-09 DIAGNOSIS — R262 Difficulty in walking, not elsewhere classified: Secondary | ICD-10-CM

## 2023-05-09 DIAGNOSIS — M6281 Muscle weakness (generalized): Secondary | ICD-10-CM

## 2023-05-09 DIAGNOSIS — R252 Cramp and spasm: Secondary | ICD-10-CM

## 2023-05-09 DIAGNOSIS — M25561 Pain in right knee: Secondary | ICD-10-CM | POA: Diagnosis not present

## 2023-05-09 NOTE — Therapy (Signed)
OUTPATIENT PHYSICAL THERAPY TREATMENT NOTE   Patient Name: Jean Smith MRN: [id number] DOB:03/03/1951, 72 y.o., female Today's Date: 05/09/2023   Progress Note Reporting Period 04/06/2023 to 05/09/2023.  See note below for Objective Data and Assessment of Progress/Goals.       END OF SESSION:  PT End of Session - 05/09/23 1026     Visit Number 10    Date for PT Re-Evaluation 06/01/23    Authorization Type [employer]    Progress Note Due on Visit 20    PT Start Time 1023   pt arrived late for appointment   PT Stop Time 1101    PT Time Calculation (min) 38 min    Activity Tolerance Patient tolerated treatment well    Behavior During Therapy WFL for tasks assessed/performed             Past Medical History:  Diagnosis Date   Allergy    Anxiety    Aortic regurgitation    moderate by echo 10/2020   Arthritis    Cancer (HCC)    skin   Cataract    bilateral - MD is just watching    Diverticulosis    Fuchs' endothelial dystrophy    follows with optho regularly    Gestational diabetes    Heart murmur    MVP    History of depression    HSV-2 infection    Hyperlipidemia    Hyperplastic colon polyp    Mitral valve prolapse    mild to moderate MR by echo 10/2020   PVC's (premature ventricular contractions)    intol of BB, sxc palpitations r/t stress   RVOT ventricular tachycardia/PVCs    EP eval 01/2013 for freq PVCs   Past Surgical History:  Procedure Laterality Date   CESAREAN SECTION     x 3   COLONOSCOPY     KNEE SURGERY Right    MANDIBLE SURGERY     right side in front of ear   MOHS SURGERY  2021   POLYPECTOMY     WISDOM TOOTH EXTRACTION     Patient Active Problem List   Diagnosis Date Noted   AC (acromioclavicular) arthritis 05/28/2022   Mitral valve prolapse    Aortic regurgitation    NSVT (nonsustained ventricular tachycardia) (HCC) 09/25/2020   Diabetes mellitus type 2 with complications (HCC) 08/27/2020   Chronic pain of  both shoulders 05/05/2018   Other fatigue 01/02/2018   Allergic rhinitis 01/02/2018   Routine general medical examination at a health care facility 09/01/2015   Hyperlipidemia associated with type 2 diabetes mellitus (HCC) 09/01/2015   Varicose vein 08/27/2014   Primary localized osteoarthrosis, lower leg 10/30/2013   RVOT ventricular tachycardia/PVCs 01/31/2013   Abnormal stress echo 12/11/2012    PCP: Myrlene Broker, MD  REFERRING PROVIDER: Judi Saa, DO  REFERRING DIAG: [phone] (ICD-10-CM) - Chronic pain of right knee  THERAPY DIAG:  Chronic pain of right knee  Stiffness of right knee, not elsewhere classified  Muscle weakness (generalized)  Cramp and spasm  Difficulty in walking, not elsewhere classified  Rationale for Evaluation and Treatment: Rehabilitation  ONSET DATE: 03/31/2023  SUBJECTIVE:   SUBJECTIVE STATEMENT: Patient reports that she did not get on the bike this weekend, as she was hoping.  States pain 5/10 this AM, but has decreased to 4/10 since she has been up.  PERTINENT HISTORY: Knee scope approx 15 years ago (right knee) PAIN:  Are you having pain? Yes: NPRS scale: 4-5/10  Pain location: right > left Pain description: aching, sharp Aggravating factors: walking, standing, bending,stooping, squatting Relieving factors: rest, meds  PRECAUTIONS: Fall  RED FLAGS: None   WEIGHT BEARING RESTRICTIONS: No  FALLS:  Has patient fallen in last 6 months? No  LIVING ENVIRONMENT: Lives with: lives with their spouse Lives in: House/apartment Stairs: Yes: Internal: 12 steps; on right going up and External: 5 steps; on right going up Has following equipment at home: None  OCCUPATION: retired  PLOF: Independent, Independent with basic ADLs, Independent with household mobility without device, Independent with community mobility without device, Independent with homemaking with ambulation, Independent with gait, and Independent with  transfers  PATIENT GOALS: to be able to walk and get up and down from a chair and do her routine daily activities without pain and without her knee giving way  NEXT MD VISIT: prn  OBJECTIVE:  Note: Objective measures were completed at Evaluation unless otherwise noted.  DIAGNOSTIC FINDINGS:  05/30/22: RIGHT KNEE 3 VIEWS  COMPARISON:  Bilateral knee radiographs May 19th, 2015  FINDINGS: No acute fracture or dislocation. No joint effusion. Mild lateral compartment subchondral sclerosis and cystic changes with mild osteophytosis. No soft tissue abnormalities.  IMPRESSION: Mild lateral compartment osteoarthritis.  07/28/22 Left knee: Resulted by: Judi Saa, DO Performed: 07/28/22 1411 - 07/28/22 1411  Accession number: [id number] Resulting lab: [facility]  Narrative: Limited muscular skeletal ultrasound was performed and interpreted by Antoine Primas, M Limited ultrasound continues to show the patient does have some hypoechoic changes in the patellofemoral joint noted.  Some degenerative changes of the meniscus noted. Impression: Improvement in inflammation but still continue effusion    PATIENT SURVEYS:  Eval: FOTO 38, predicted 55 05/09/2023:  FOTO 49  COGNITION: Overall cognitive status: Within functional limits for tasks assessed     SENSATION: WFL   POSTURE:  slight knee valgus bilaterally  PALPATION: Mod crepitus noted on seated open chain flexion/extension  LOWER EXTREMITY ROM:  WNL LOWER EXTREMITY MMT:  Generally 4/5 with exception of bilateral knee flexion 4-/5 and bilateral hip abd 4-/5 and bilateral hip ER 3+/5  LOWER EXTREMITY SPECIAL TESTS:  Knee special tests: Patellafemoral grind test: positive   FUNCTIONAL TESTS:  Eval: 5 times sit to stand: 24 sec Timed up and go (TUG): 14.5 sec  05/03/2023: 5 times sit to stand:  18.36 sec without UE use Timed up and go (TUG): 8.00 sec 3 minute walk test: 612 ft without increased pain with  RPE of 3/10  GAIT: Distance walked: 50 feet Assistive device utilized: None Level of assistance: Complete Independence Comments: antalgic, slow, right LE externally rotated, guarded, short step length   TODAY'S TREATMENT:     DATE: 05/09/2023 Recumbent bike level 2, x6 min with PT present to discuss status FOTO Seated with 4# ankle weight:  LAQ, marching. 2x10 each bilat Ambulation with 4# ankle weights down PT gym hallway and back Seated hamstring curls with green tband 2x10 bilat 3 way steps on 2" step (fwd, lateral, down) x8 each bilat Leg Press (seat at 7) 90# x10, 80# x10 Leg Press machine for calf raise (seat at 7) 80# 2x10 Standing calf stretch with toes on half foam bolster x20 sec   DATE: 05/05/2023 Recumbent bike level 1 x 10 min with PT present to discuss status Seated with 4# ankle weight:  LAQ, marching. 2x10 each bilat Seated hamstring curls with green tband 2x10 bilat 2 inch step ups 5x right/left (right 4/10 pain) 2 inch lateral step ups  5x right/left (feels wobbly balance- wise on right) Leg Press (seat at 8) 80# 10x 2 Single Leg Press (seat 8) 40# 15x bilat Standing at railing:  donkey kick (bent knee hip extension) and fire hydrants X10 each bilat  DATE: 05/03/2023 Recumbent bike level 2, x6 min with PT present to discuss status Functional tests:  5 times sit to stand and TUG 3 minute walk test: 612 ft Seated with 4# ankle weight:  LAQ, marching. 2x7 each bilat Seated hamstring curls with green tband 2x10 bilat Leg Press (seat at 8) 80# 2x10 Single Leg Press (seat 8) 40# 2x10 bilat Standing at counter:  hip abduction and hip extension with foot on slider.  X10 each bilat     PATIENT EDUCATION:  Education details: Initiated HEP Person educated: Patient Education method: Programmer, multimedia, Facilities manager, Verbal cues, and Handouts Education comprehension: verbalized understanding, returned demonstration, and verbal cues required  HOME EXERCISE  PROGRAM: Access Code: [email or web address] URL: [email or web address] Date: 04/12/2023 Prepared by: Lavinia Sharps  Exercises - Long Sitting Quad Set  - 1 x daily - 7 x weekly - 3 sets - 10 reps - Supine Knee Extension Strengthening  - 1 x daily - 7 x weekly - 3 sets - 10 reps - Seated Long Arc Quad  - 1 x daily - 7 x weekly - 3 sets - 10 reps - Straight Leg Raise  - 1 x daily - 7 x weekly - 1 sets - 10 reps - Clamshell  - 1 x daily - 7 x weekly - 1 sets - 10 reps - Seated Hamstring Curl with Anchored Resistance  - 1 x daily - 7 x weekly - 1 sets - 10 reps - Standing Heel Raise with Support  - 1 x daily - 7 x weekly - 1 sets - 10 reps  ASSESSMENT:  CLINICAL IMPRESSION: Ms Santanna presents to skilled PT with continued complaints of knee pain.  Patient states that she has not been using her recumbent bike at home, like she was hoping to do this past weekend.  Patient is progressing with improved FOTO score noted today.  Patient is progressing towards goals, but continues to have increased pain overall with activities.  Patient states that she continues to have pain waking her up in the middle of the night.  Patient continues to require skilled PT to progress towards goal related activities.    OBJECTIVE IMPAIRMENTS: Abnormal gait, decreased balance, decreased knowledge of use of DME, decreased mobility, difficulty walking, decreased strength, increased edema, increased fascial restrictions, increased muscle spasms, impaired flexibility, postural dysfunction, obesity, and pain.   ACTIVITY LIMITATIONS: lifting, bending, sitting, standing, squatting, sleeping, stairs, transfers, bed mobility, bathing, toileting, dressing, and caring for others  PARTICIPATION LIMITATIONS: meal prep, cleaning, laundry, driving, shopping, community activity, yard work, and church  PERSONAL FACTORS: Fitness, Time since onset of injury/illness/exacerbation, and 1-2 comorbidities: anxiety and obesity  are also  affecting patient's functional outcome.   REHAB POTENTIAL: Fair due to possible mechanical meniscus injury or joint deterioration  CLINICAL DECISION MAKING: Evolving/moderate complexity  EVALUATION COMPLEXITY: Moderate   GOALS: Goals reviewed with patient? Yes  SHORT TERM GOALS: Target date: 05/04/2023   Patient will be independent with initial HEP  Baseline: Goal status: MET on 05/03/2023  2.  Pain report to be no greater than 4/10  Baseline:  Goal status: Ongoing   LONG TERM GOALS: Target date: 06/01/2023   Patient to be independent with advanced HEP  Baseline:  Goal status: Ongoing  2.  Patient to report pain no greater than 2/10  Baseline:  Goal status: INITIAL  3.  Patient to be able to bend, stoop and squat with pain no greater than 2/10  Baseline:  Goal status: INITIAL  4.  Patient to be able to ascend and descend steps without pain or no greater than 2/10  Baseline:  Goal status: Ongoing  5.  Patient to be able to sleep through the night  Baseline:  Goal status: Ongoing  6.  Patient to report 85% improvement in overall symptoms  Baseline:  Goal status: Ongoing   PLAN:  PT FREQUENCY: 1-2x/week  PT DURATION: 8 weeks  PLANNED INTERVENTIONS: 97110-Therapeutic exercises, 97530- Therapeutic activity, 97112- Neuromuscular re-education, 97535- Self Care, 40347- Manual therapy, [phone]- Gait training, [phone]- Aquatic Therapy, [phone]- Splinting, 97014- Electrical stimulation (unattended), [phone]- Electrical stimulation (manual), 97016- Vasopneumatic device, [id number]- Ultrasound, [id number]- Ionotophoresis 4mg /ml Dexamethasone, Patient/Family education, Balance training, Stair training, Taping, Dry Needling, Joint mobilization, Scar mobilization, Compression bandaging, Vestibular training, Visual/preceptual remediation/compensation, Cryotherapy, and Moist heat  PLAN FOR NEXT SESSION: Assess and progress HEP as indicated,quad, HS, glute strengthening in unloaded positions  initially and will work up to weight bearing over time   Reather Laurence, PT, DPT 05/09/23, 12:05 PM  [facility] [address], [address] [place], [place] [address] Phone # [phone] Fax [phone]

## 2023-05-16 ENCOUNTER — Ambulatory Visit: Payer: PPO | Attending: Family Medicine

## 2023-05-16 DIAGNOSIS — R293 Abnormal posture: Secondary | ICD-10-CM | POA: Diagnosis not present

## 2023-05-16 DIAGNOSIS — M25561 Pain in right knee: Secondary | ICD-10-CM | POA: Diagnosis not present

## 2023-05-16 DIAGNOSIS — R252 Cramp and spasm: Secondary | ICD-10-CM | POA: Diagnosis not present

## 2023-05-16 DIAGNOSIS — G8929 Other chronic pain: Secondary | ICD-10-CM

## 2023-05-16 DIAGNOSIS — M25661 Stiffness of right knee, not elsewhere classified: Secondary | ICD-10-CM

## 2023-05-16 DIAGNOSIS — M6281 Muscle weakness (generalized): Secondary | ICD-10-CM | POA: Diagnosis not present

## 2023-05-16 DIAGNOSIS — R262 Difficulty in walking, not elsewhere classified: Secondary | ICD-10-CM | POA: Diagnosis not present

## 2023-05-16 NOTE — Therapy (Signed)
OUTPATIENT PHYSICAL THERAPY TREATMENT NOTE   Patient Name: Jean Smith MRN: [id number] DOB:Feb 21, 1951, 72 y.o., female Today's Date: 05/16/2023    END OF SESSION:  PT End of Session - 05/16/23 1151     Visit Number 11    Date for PT Re-Evaluation 06/01/23    Authorization Type [employer]    Progress Note Due on Visit 20    PT Start Time 1150    PT Stop Time 1238    PT Time Calculation (min) 48 min    Activity Tolerance Patient tolerated treatment well    Behavior During Therapy [facility] for tasks assessed/performed             Past Medical History:  Diagnosis Date   Allergy    Anxiety    Aortic regurgitation    moderate by echo 10/2020   Arthritis    Cancer (HCC)    skin   Cataract    bilateral - MD is just watching    Diverticulosis    Fuchs' endothelial dystrophy    follows with optho regularly    Gestational diabetes    Heart murmur    MVP    History of depression    HSV-2 infection    Hyperlipidemia    Hyperplastic colon polyp    Mitral valve prolapse    mild to moderate MR by echo 10/2020   PVC's (premature ventricular contractions)    intol of BB, sxc palpitations r/t stress   RVOT ventricular tachycardia/PVCs    EP eval 01/2013 for freq PVCs   Past Surgical History:  Procedure Laterality Date   CESAREAN SECTION     x 3   COLONOSCOPY     KNEE SURGERY Right    MANDIBLE SURGERY     right side in front of ear   MOHS SURGERY  2021   POLYPECTOMY     WISDOM TOOTH EXTRACTION     Patient Active Problem List   Diagnosis Date Noted   AC (acromioclavicular) arthritis 05/28/2022   Mitral valve prolapse    Aortic regurgitation    NSVT (nonsustained ventricular tachycardia) (HCC) 09/25/2020   Diabetes mellitus type 2 with complications (HCC) 08/27/2020   Chronic pain of both shoulders 05/05/2018   Other fatigue 01/02/2018   Allergic rhinitis 01/02/2018   Routine general medical examination at a health care facility 09/01/2015    Hyperlipidemia associated with type 2 diabetes mellitus (HCC) 09/01/2015   Varicose vein 08/27/2014   Primary localized osteoarthrosis, lower leg 10/30/2013   RVOT ventricular tachycardia/PVCs 01/31/2013   Abnormal stress echo 12/11/2012    PCP: Myrlene Broker, MD  REFERRING PROVIDER: Judi Saa, DO  REFERRING DIAG: [phone] (ICD-10-CM) - Chronic pain of right knee  THERAPY DIAG:  Chronic pain of right knee  Stiffness of right knee, not elsewhere classified  Muscle weakness (generalized)  Difficulty in walking, not elsewhere classified  Cramp and spasm  Abnormal posture  Rationale for Evaluation and Treatment: Rehabilitation  ONSET DATE: 03/31/2023  SUBJECTIVE:   SUBJECTIVE STATEMENT: Patient reports that she was sore from last visit but doing ok now.  Pain in right knee 3/10, and in left knee 3/10.    PERTINENT HISTORY: Knee scope approx 15 years ago (right knee) PAIN:  05/16/23 Are you having pain? Yes: NPRS scale: 3 bilateral knees/10 Pain location: right > left Pain description: aching, sharp Aggravating factors: walking, standing, bending,stooping, squatting Relieving factors: rest, meds  PRECAUTIONS: Fall  RED FLAGS: None   WEIGHT  BEARING RESTRICTIONS: No  FALLS:  Has patient fallen in last 6 months? No  LIVING ENVIRONMENT: Lives with: lives with their spouse Lives in: House/apartment Stairs: Yes: Internal: 12 steps; on right going up and External: 5 steps; on right going up Has following equipment at home: None  OCCUPATION: retired  PLOF: Independent, Independent with basic ADLs, Independent with household mobility without device, Independent with community mobility without device, Independent with homemaking with ambulation, Independent with gait, and Independent with transfers  PATIENT GOALS: to be able to walk and get up and down from a chair and do her routine daily activities without pain and without her knee giving  way  NEXT MD VISIT: prn  OBJECTIVE:  Note: Objective measures were completed at Evaluation unless otherwise noted.  DIAGNOSTIC FINDINGS:  05/30/22: RIGHT KNEE 3 VIEWS  COMPARISON:  Bilateral knee radiographs May 19th, 2015  FINDINGS: No acute fracture or dislocation. No joint effusion. Mild lateral compartment subchondral sclerosis and cystic changes with mild osteophytosis. No soft tissue abnormalities.  IMPRESSION: Mild lateral compartment osteoarthritis.  07/28/22 Left knee: Resulted by: Judi Saa, DO Performed: 07/28/22 1411 - 07/28/22 1411  Accession number: [id number] Resulting lab: [facility]  Narrative: Limited muscular skeletal ultrasound was performed and interpreted by Antoine Primas, M Limited ultrasound continues to show the patient does have some hypoechoic changes in the patellofemoral joint noted.  Some degenerative changes of the meniscus noted. Impression: Improvement in inflammation but still continue effusion    PATIENT SURVEYS:  Eval: FOTO 38, predicted 55 05/09/2023:  FOTO 49  COGNITION: Overall cognitive status: Within functional limits for tasks assessed     SENSATION: WFL   POSTURE:  slight knee valgus bilaterally  PALPATION: Mod crepitus noted on seated open chain flexion/extension  LOWER EXTREMITY ROM:  WNL LOWER EXTREMITY MMT:  Generally 4/5 with exception of bilateral knee flexion 4-/5 and bilateral hip abd 4-/5 and bilateral hip ER 3+/5  LOWER EXTREMITY SPECIAL TESTS:  Knee special tests: Patellafemoral grind test: positive   FUNCTIONAL TESTS:  Eval: 5 times sit to stand: 24 sec Timed up and go (TUG): 14.5 sec  05/03/2023: 5 times sit to stand:  18.36 sec without UE use Timed up and go (TUG): 8.00 sec 3 minute walk test: 612 ft without increased pain with RPE of 3/10  GAIT: Distance walked: 50 feet Assistive device utilized: None Level of assistance: Complete Independence Comments: antalgic, slow, right  LE externally rotated, guarded, short step length   TODAY'S TREATMENT:    DATE: 05/16/2023 Recumbent bike level 1 x 5 min with PT present to discuss status Seated with 4# ankle weight:  LAQ  2x10 each bilat Seated with 4# ankle weight:  marching x 20 Seated hip ER with 4# 2 x 10 each LE Seated hamstring curls with green tband 2x10 bilat Lateral band walks with blue loop at back counter x 3 laps (patient states she felt this in her lower leg?) 4 inch step ups 2 x 10 right/left (right 4/10 pain) 4 inch lateral step ups 2 x 10 right/left  Leg Press (seat at 8) 70# 2 x 10 (patient mentioned that she felt like the 80# was too much) after first set of 10 she wanted to increase to 75# Single Leg Press (seat 8) 40# 2 x 10 bilat Discussed appropriate amount of activity at home and ok to use bike for exercise at home.   DATE: 05/09/2023 Recumbent bike level 2, x6 min with PT present to discuss status  FOTO Seated with 4# ankle weight:  LAQ, marching. 2x10 each bilat Ambulation with 4# ankle weights down PT gym hallway and back Seated hamstring curls with green tband 2x10 bilat 3 way steps on 2" step (fwd, lateral, down) x8 each bilat Leg Press (seat at 7) 90# x10, 80# x10 Leg Press machine for calf raise (seat at 7) 80# 2x10 Standing calf stretch with toes on half foam bolster x20 sec   DATE: 05/05/2023 Recumbent bike level 1 x 10 min with PT present to discuss status Seated with 4# ankle weight:  LAQ, marching. 2x10 each bilat Seated hamstring curls with green tband 2x10 bilat 2 inch step ups 5x right/left (right 4/10 pain) 2 inch lateral step ups 5x right/left (feels wobbly balance- wise on right) Leg Press (seat at 8) 80# 10x 2 Single Leg Press (seat 8) 40# 15x bilat Standing at railing:  donkey kick (bent knee hip extension) and fire hydrants X10 each bilat   PATIENT EDUCATION:  Education details: Initiated HEP Person educated: Patient Education method: Programmer, multimedia, Facilities manager,  Verbal cues, and Handouts Education comprehension: verbalized understanding, returned demonstration, and verbal cues required  HOME EXERCISE PROGRAM: Access Code: [email or web address] URL: [email or web address] Date: 04/12/2023 Prepared by: Lavinia Sharps  Exercises - Long Sitting Quad Set  - 1 x daily - 7 x weekly - 3 sets - 10 reps - Supine Knee Extension Strengthening  - 1 x daily - 7 x weekly - 3 sets - 10 reps - Seated Long Arc Quad  - 1 x daily - 7 x weekly - 3 sets - 10 reps - Straight Leg Raise  - 1 x daily - 7 x weekly - 1 sets - 10 reps - Clamshell  - 1 x daily - 7 x weekly - 1 sets - 10 reps - Seated Hamstring Curl with Anchored Resistance  - 1 x daily - 7 x weekly - 1 sets - 10 reps - Standing Heel Raise with Support  - 1 x daily - 7 x weekly - 1 sets - 10 reps  ASSESSMENT:  CLINICAL IMPRESSION: Regine is progressing appropriately.  She admits she has never been much of an "exerciser" or very active so she is guarded when she feels any pain or discomfort and tends to be protective, decreasing her activity.  She was able to do 4 inch step vs 2 inch today.  We decreased her leg press resistance due to her feeling sore after last visit.  Patient continues to require skilled PT to progress towards goal related activities.    OBJECTIVE IMPAIRMENTS: Abnormal gait, decreased balance, decreased knowledge of use of DME, decreased mobility, difficulty walking, decreased strength, increased edema, increased fascial restrictions, increased muscle spasms, impaired flexibility, postural dysfunction, obesity, and pain.   ACTIVITY LIMITATIONS: lifting, bending, sitting, standing, squatting, sleeping, stairs, transfers, bed mobility, bathing, toileting, dressing, and caring for others  PARTICIPATION LIMITATIONS: meal prep, cleaning, laundry, driving, shopping, community activity, yard work, and church  PERSONAL FACTORS: Fitness, Time since onset of injury/illness/exacerbation, and 1-2  comorbidities: anxiety and obesity  are also affecting patient's functional outcome.   REHAB POTENTIAL: Fair due to possible mechanical meniscus injury or joint deterioration  CLINICAL DECISION MAKING: Evolving/moderate complexity  EVALUATION COMPLEXITY: Moderate   GOALS: Goals reviewed with patient? Yes  SHORT TERM GOALS: Target date: 05/04/2023   Patient will be independent with initial HEP  Baseline: Goal status: MET on 05/03/2023  2.  Pain report to be no greater than 4/10  Baseline:  Goal status:  Ongoing   LONG TERM GOALS: Target date: 06/01/2023   Patient to be independent with advanced HEP  Baseline:  Goal status: Ongoing  2.  Patient to report pain no greater than 2/10  Baseline:  Goal status: INITIAL  3.  Patient to be able to bend, stoop and squat with pain no greater than 2/10  Baseline:  Goal status: INITIAL  4.  Patient to be able to ascend and descend steps without pain or no greater than 2/10  Baseline:  Goal status: Ongoing  5.  Patient to be able to sleep through the night  Baseline:  Goal status: Ongoing  6.  Patient to report 85% improvement in overall symptoms  Baseline:  Goal status: Ongoing   PLAN:  PT FREQUENCY: 1-2x/week  PT DURATION: 8 weeks  PLANNED INTERVENTIONS: 97110-Therapeutic exercises, 97530- Therapeutic activity, [id number]- Neuromuscular re-education, 97535- Self Care, 95621- Manual therapy, [id number]- Gait training, [phone]- Aquatic Therapy, [phone]- Splinting, 97014- Electrical stimulation (unattended), [id number]- Electrical stimulation (manual), 97016- Vasopneumatic device, [id number]- Ultrasound, [id number]- Ionotophoresis 4mg /ml Dexamethasone, Patient/Family education, Balance training, Stair training, Taping, Dry Needling, Joint mobilization, Scar mobilization, Compression bandaging, Vestibular training, Visual/preceptual remediation/compensation, Cryotherapy, and Moist heat  PLAN FOR NEXT SESSION: Possibly quad progression in supine, assess  and progress HEP as indicated,quad, HS, glute strengthening in unloaded positions initially and will work up to weight bearing over time   [place] B. Freida Nebel, PT 05/16/23 12:51 PM [facility] [address], [address] [place], [place] [address] Phone # [phone] Fax [phone]

## 2023-05-17 ENCOUNTER — Ambulatory Visit: Payer: PPO | Admitting: Physical Therapy

## 2023-05-19 ENCOUNTER — Ambulatory Visit: Payer: PPO | Admitting: Physical Therapy

## 2023-05-19 DIAGNOSIS — M25561 Pain in right knee: Secondary | ICD-10-CM | POA: Diagnosis not present

## 2023-05-19 DIAGNOSIS — G8929 Other chronic pain: Secondary | ICD-10-CM

## 2023-05-19 DIAGNOSIS — M6281 Muscle weakness (generalized): Secondary | ICD-10-CM

## 2023-05-19 DIAGNOSIS — M25661 Stiffness of right knee, not elsewhere classified: Secondary | ICD-10-CM

## 2023-05-19 NOTE — Therapy (Signed)
OUTPATIENT PHYSICAL THERAPY TREATMENT NOTE   Patient Name: Jean Smith MRN: [id number] DOB:[birth year], 72 y.o., female Today's Date: 05/19/2023    END OF SESSION:  PT End of Session - 05/19/23 1023     Visit Number 12    Date for PT Re-Evaluation 06/01/23    Authorization Type [employer]    Progress Note Due on Visit 20    PT Start Time 1023    PT Stop Time 1101    PT Time Calculation (min) 38 min    Activity Tolerance Patient tolerated treatment well             Past Medical History:  Diagnosis Date   Allergy    Anxiety    Aortic regurgitation    moderate by echo 10/2020   Arthritis    Cancer (HCC)    skin   Cataract    bilateral - MD is just watching    Diverticulosis    Fuchs' endothelial dystrophy    follows with optho regularly    Gestational diabetes    Heart murmur    MVP    History of depression    HSV-2 infection    Hyperlipidemia    Hyperplastic colon polyp    Mitral valve prolapse    mild to moderate MR by echo 10/2020   PVC's (premature ventricular contractions)    intol of BB, sxc palpitations r/t stress   RVOT ventricular tachycardia/PVCs    EP eval 01/2013 for freq PVCs   Past Surgical History:  Procedure Laterality Date   CESAREAN SECTION     x 3   COLONOSCOPY     KNEE SURGERY Right    MANDIBLE SURGERY     right side in front of ear   MOHS SURGERY  2021   POLYPECTOMY     WISDOM TOOTH EXTRACTION     Patient Active Problem List   Diagnosis Date Noted   AC (acromioclavicular) arthritis 05/28/2022   Mitral valve prolapse    Aortic regurgitation    NSVT (nonsustained ventricular tachycardia) (HCC) 09/25/2020   Diabetes mellitus type 2 with complications (HCC) 08/27/2020   Chronic pain of both shoulders 05/05/2018   Other fatigue 01/02/2018   Allergic rhinitis 01/02/2018   Routine general medical examination at a health care facility 09/01/2015   Hyperlipidemia associated with type 2 diabetes mellitus (HCC)  09/01/2015   Varicose vein 08/27/2014   Primary localized osteoarthrosis, lower leg 10/30/2013   RVOT ventricular tachycardia/PVCs 01/31/2013   Abnormal stress echo 12/11/2012    PCP: Myrlene Broker, MD  REFERRING PROVIDER: Judi Saa, DO  REFERRING DIAG: [phone] (ICD-10-CM) - Chronic pain of right knee  THERAPY DIAG:  Chronic pain of right knee  Stiffness of right knee, not elsewhere classified  Muscle weakness (generalized)  Rationale for Evaluation and Treatment: Rehabilitation  ONSET DATE: 03/31/2023  SUBJECTIVE:   SUBJECTIVE STATEMENT: My knees are OK but I haven't done much today except hobble down the stairs.  I didn't hurt after last time. I haven't done the bike yet at home.  PERTINENT HISTORY: Knee scope approx 15 years ago (right knee) PAIN:   Are you having pain? Yes: NPRS scale: 3 /10 Pain location: bil knees right > left Pain description: aching, sharp Aggravating factors: walking, standing, bending,stooping, squatting Relieving factors: rest, meds  PRECAUTIONS: Fall  RED FLAGS: None   WEIGHT BEARING RESTRICTIONS: No  FALLS:  Has patient fallen in last 6 months? No  LIVING ENVIRONMENT: Lives with:  lives with their spouse Lives in: House/apartment Stairs: Yes: Internal: 12 steps; on right going up and External: 5 steps; on right going up Has following equipment at home: None  OCCUPATION: retired  PLOF: Independent, Independent with basic ADLs, Independent with household mobility without device, Independent with community mobility without device, Independent with homemaking with ambulation, Independent with gait, and Independent with transfers  PATIENT GOALS: to be able to walk and get up and down from a chair and do her routine daily activities without pain and without her knee giving way  NEXT MD VISIT: prn  OBJECTIVE:  Note: Objective measures were completed at Evaluation unless otherwise noted.  DIAGNOSTIC FINDINGS:   05/30/22: RIGHT KNEE 3 VIEWS  COMPARISON:  Bilateral knee radiographs May 19th, 2015  FINDINGS: No acute fracture or dislocation. No joint effusion. Mild lateral compartment subchondral sclerosis and cystic changes with mild osteophytosis. No soft tissue abnormalities.  IMPRESSION: Mild lateral compartment osteoarthritis.  07/28/22 Left knee: Resulted by: Judi Saa, DO Performed: 07/28/22 1411 - 07/28/22 1411  Accession number: [id number] Resulting lab: [facility]  Narrative: Limited muscular skeletal ultrasound was performed and interpreted by Antoine Primas, M Limited ultrasound continues to show the patient does have some hypoechoic changes in the patellofemoral joint noted.  Some degenerative changes of the meniscus noted. Impression: Improvement in inflammation but still continue effusion    PATIENT SURVEYS:  Eval: FOTO 38, predicted 55 05/09/2023:  FOTO 49  COGNITION: Overall cognitive status: Within functional limits for tasks assessed     SENSATION: WFL   POSTURE:  slight knee valgus bilaterally  PALPATION: Mod crepitus noted on seated open chain flexion/extension  LOWER EXTREMITY ROM:  WNL LOWER EXTREMITY MMT:  Generally 4/5 with exception of bilateral knee flexion 4-/5 and bilateral hip abd 4-/5 and bilateral hip ER 3+/5  LOWER EXTREMITY SPECIAL TESTS:  Knee special tests: Patellafemoral grind test: positive   FUNCTIONAL TESTS:  Eval: 5 times sit to stand: 24 sec Timed up and go (TUG): 14.5 sec  05/03/2023: 5 times sit to stand:  18.36 sec without UE use Timed up and go (TUG): 8.00 sec 3 minute walk test: 612 ft without increased pain with RPE of 3/10  GAIT: Distance walked: 50 feet Assistive device utilized: None Level of assistance: Complete Independence Comments: antalgic, slow, right LE externally rotated, guarded, short step length   TODAY'S TREATMENT:    DATE: 05/19/2023 Recumbent bike level 1 x 7 min with PT present  to discuss status Seated with 4# ankle weight:  LAQ  2x10 each bilat Seated with 4# ankle weight: sitting on purple cushion for some elevation up and over target on floor 10x right/left Seated hamstring curls with green band (high on leg b/c of varicose veins) 2x10 bilat Lateral band walks with green band above knees with cue not to let legs touch 8 laps at the railing 4 inch step ups (long step) 2 x 10 right/left (feels unsteady on right uses railing assist) Leg Press (seat at 8) (try closer seat next time) 75# 2 x 10 (patient mentioned that she felt like the 80# was too much)  Single Leg Press (seat 8) 40# 2 x 10 bilat Standing bil calf raises 10x  DATE: 05/16/2023 Recumbent bike level 1 x 5 min with PT present to discuss status Seated with 4# ankle weight:  LAQ  2x10 each bilat Seated with 4# ankle weight:  marching x 20 Seated hip ER with 4# 2 x 10 each LE Seated hamstring  curls with green tband 2x10 bilat Lateral band walks with blue loop at back counter x 3 laps (patient states she felt this in her lower leg?) 4 inch step ups 2 x 10 right/left (right 4/10 pain) 4 inch lateral step ups 2 x 10 right/left  Leg Press (seat at 8) 70# 2 x 10 (patient mentioned that she felt like the 80# was too much) after first set of 10 she wanted to increase to 75# Single Leg Press (seat 8) 40# 2 x 10 bilat Discussed appropriate amount of activity at home and ok to use bike for exercise at home.   DATE: 05/09/2023 Recumbent bike level 2, x6 min with PT present to discuss status FOTO Seated with 4# ankle weight:  LAQ, marching. 2x10 each bilat Ambulation with 4# ankle weights down PT gym hallway and back Seated hamstring curls with green tband 2x10 bilat 3 way steps on 2" step (fwd, lateral, down) x8 each bilat Leg Press (seat at 7) 90# x10, 80# x10 Leg Press machine for calf raise (seat at 7) 80# 2x10 Standing calf stretch with toes on half foam bolster x20 sec   DATE: 05/05/2023 Recumbent bike  level 1 x 10 min with PT present to discuss status Seated with 4# ankle weight:  LAQ, marching. 2x10 each bilat Seated hamstring curls with green tband 2x10 bilat 2 inch step ups 5x right/left (right 4/10 pain) 2 inch lateral step ups 5x right/left (feels wobbly balance- wise on right) Leg Press (seat at 8) 80# 10x 2 Single Leg Press (seat 8) 40# 15x bilat Standing at railing:  donkey kick (bent knee hip extension) and fire hydrants X10 each bilat   PATIENT EDUCATION:  Education details: Initiated HEP Person educated: Patient Education method: Programmer, multimedia, Facilities manager, Verbal cues, and Handouts Education comprehension: verbalized understanding, returned demonstration, and verbal cues required  HOME EXERCISE PROGRAM: Access Code: [email or web address] URL: [email or web address] Date: 04/12/2023 Prepared by: Lavinia Sharps  Exercises - Long Sitting Quad Set  - 1 x daily - 7 x weekly - 3 sets - 10 reps - Supine Knee Extension Strengthening  - 1 x daily - 7 x weekly - 3 sets - 10 reps - Seated Long Arc Quad  - 1 x daily - 7 x weekly - 3 sets - 10 reps - Straight Leg Raise  - 1 x daily - 7 x weekly - 1 sets - 10 reps - Clamshell  - 1 x daily - 7 x weekly - 1 sets - 10 reps - Seated Hamstring Curl with Anchored Resistance  - 1 x daily - 7 x weekly - 1 sets - 10 reps - Standing Heel Raise with Support  - 1 x daily - 7 x weekly - 1 sets - 10 reps  ASSESSMENT:  CLINICAL IMPRESSION: Verbal cues for patellofemoral alignment particularly with seated hip flexion/abduction and avoidance of external rotation which causes lateral knee pain.  Band above the knees worked well to activate both gluteal and quad muscles and with good alignment.  Step ups are more difficult on right vs. Left but tolerance is improving since start of care.  Therapist monitoring response to all interventions and modifying treatment accordingly.     OBJECTIVE IMPAIRMENTS: Abnormal gait, decreased balance, decreased  knowledge of use of DME, decreased mobility, difficulty walking, decreased strength, increased edema, increased fascial restrictions, increased muscle spasms, impaired flexibility, postural dysfunction, obesity, and pain.   ACTIVITY LIMITATIONS: lifting, bending, sitting, standing, squatting, sleeping, stairs, transfers, bed mobility, bathing, toileting, dressing, and  caring for others  PARTICIPATION LIMITATIONS: meal prep, cleaning, laundry, driving, shopping, community activity, yard work, and church  PERSONAL FACTORS: Fitness, Time since onset of injury/illness/exacerbation, and 1-2 comorbidities: anxiety and obesity  are also affecting patient's functional outcome.   REHAB POTENTIAL: Fair due to possible mechanical meniscus injury or joint deterioration  CLINICAL DECISION MAKING: Evolving/moderate complexity  EVALUATION COMPLEXITY: Moderate   GOALS: Goals reviewed with patient? Yes  SHORT TERM GOALS: Target date: 05/04/2023   Patient will be independent with initial HEP  Baseline: Goal status: MET on 05/03/2023  2.  Pain report to be no greater than 4/10  Baseline:  Goal status: Ongoing   LONG TERM GOALS: Target date: 06/01/2023   Patient to be independent with advanced HEP  Baseline:  Goal status: Ongoing  2.  Patient to report pain no greater than 2/10  Baseline:  Goal status: INITIAL  3.  Patient to be able to bend, stoop and squat with pain no greater than 2/10  Baseline:  Goal status: INITIAL  4.  Patient to be able to ascend and descend steps without pain or no greater than 2/10  Baseline:  Goal status: Ongoing  5.  Patient to be able to sleep through the night  Baseline:  Goal status: Ongoing  6.  Patient to report 85% improvement in overall symptoms  Baseline:  Goal status: Ongoing   PLAN:  PT FREQUENCY: 1-2x/week  PT DURATION: 8 weeks  PLANNED INTERVENTIONS: 97110-Therapeutic exercises, 97530- Therapeutic activity, [id number]- Neuromuscular  re-education, 97535- Self Care, 16109- Manual therapy, [id number]- Gait training, [phone]- Aquatic Therapy, [phone]- Splinting, 97014- Electrical stimulation (unattended), [id number]- Electrical stimulation (manual), 97016- Vasopneumatic device, [id number]- Ultrasound, [id number]- Ionotophoresis 4mg /ml Dexamethasone, Patient/Family education, Balance training, Stair training, Taping, Dry Needling, Joint mobilization, Scar mobilization, Compression bandaging, Vestibular training, Visual/preceptual remediation/compensation, Cryotherapy, and Moist heat  PLAN FOR NEXT SESSION:follow up on bike use at home, quad, HS, glute strengthening in unloaded positions and partial load  Lavinia Sharps, PT 05/19/23 2:10 PM Phone: [phone] Fax: [phone]

## 2023-05-24 ENCOUNTER — Ambulatory Visit: Payer: PPO | Admitting: Physical Therapy

## 2023-05-24 DIAGNOSIS — M25561 Pain in right knee: Secondary | ICD-10-CM | POA: Diagnosis not present

## 2023-05-24 DIAGNOSIS — M6281 Muscle weakness (generalized): Secondary | ICD-10-CM

## 2023-05-24 DIAGNOSIS — G8929 Other chronic pain: Secondary | ICD-10-CM

## 2023-05-24 DIAGNOSIS — M25661 Stiffness of right knee, not elsewhere classified: Secondary | ICD-10-CM

## 2023-05-24 NOTE — Therapy (Signed)
OUTPATIENT PHYSICAL THERAPY TREATMENT NOTE   Patient Name: Jean Smith MRN: [id number] DOB:[birth year], 72 y.o., female Today's Date: 05/24/2023    END OF SESSION:  PT End of Session - 05/24/23 1235     Visit Number 13    Date for PT Re-Evaluation 06/01/23    Authorization Type [employer]    Progress Note Due on Visit 20    PT Start Time 1232    PT Stop Time 1315    PT Time Calculation (min) 43 min    Activity Tolerance Patient tolerated treatment well             Past Medical History:  Diagnosis Date   Allergy    Anxiety    Aortic regurgitation    moderate by echo 10/2020   Arthritis    Cancer (HCC)    skin   Cataract    bilateral - MD is just watching    Diverticulosis    Fuchs' endothelial dystrophy    follows with optho regularly    Gestational diabetes    Heart murmur    MVP    History of depression    HSV-2 infection    Hyperlipidemia    Hyperplastic colon polyp    Mitral valve prolapse    mild to moderate MR by echo 10/2020   PVC's (premature ventricular contractions)    intol of BB, sxc palpitations r/t stress   RVOT ventricular tachycardia/PVCs    EP eval 01/2013 for freq PVCs   Past Surgical History:  Procedure Laterality Date   CESAREAN SECTION     x 3   COLONOSCOPY     KNEE SURGERY Right    MANDIBLE SURGERY     right side in front of ear   MOHS SURGERY  2021   POLYPECTOMY     WISDOM TOOTH EXTRACTION     Patient Active Problem List   Diagnosis Date Noted   AC (acromioclavicular) arthritis 05/28/2022   Mitral valve prolapse    Aortic regurgitation    NSVT (nonsustained ventricular tachycardia) (HCC) 09/25/2020   Diabetes mellitus type 2 with complications (HCC) 08/27/2020   Chronic pain of both shoulders 05/05/2018   Other fatigue 01/02/2018   Allergic rhinitis 01/02/2018   Routine general medical examination at a health care facility 09/01/2015   Hyperlipidemia associated with type 2 diabetes mellitus (HCC)  09/01/2015   Varicose vein 08/27/2014   Primary localized osteoarthrosis, lower leg 10/30/2013   RVOT ventricular tachycardia/PVCs 01/31/2013   Abnormal stress echo 12/11/2012    PCP: Myrlene Broker, MD  REFERRING PROVIDER: Judi Saa, DO  REFERRING DIAG: [phone] (ICD-10-CM) - Chronic pain of right knee  THERAPY DIAG:  Chronic pain of right knee  Stiffness of right knee, not elsewhere classified  Muscle weakness (generalized)  Rationale for Evaluation and Treatment: Rehabilitation  ONSET DATE: 03/31/2023  SUBJECTIVE:   SUBJECTIVE STATEMENT: I fell on Thursday night tripped on raised pathway going into my neighbor's house for a Christmas party.  Bruises on left wrist, left elbow and right > left knees.  I haven't done anything since then. PERTINENT HISTORY: Knee scope approx 15 years ago (right knee) PAIN:   Are you having pain? Yes: NPRS scale: 4/10 Pain location: bil knees right > left Pain description: aching, sharp Aggravating factors: walking, standing, bending,stooping, squatting Relieving factors: rest, meds  PRECAUTIONS: Fall  RED FLAGS: None   WEIGHT BEARING RESTRICTIONS: No  FALLS:  Has patient fallen in last 6 months? No  LIVING ENVIRONMENT: Lives with: lives with their spouse Lives in: House/apartment Stairs: Yes: Internal: 12 steps; on right going up and External: 5 steps; on right going up Has following equipment at home: None  OCCUPATION: retired  PLOF: Independent, Independent with basic ADLs, Independent with household mobility without device, Independent with community mobility without device, Independent with homemaking with ambulation, Independent with gait, and Independent with transfers  PATIENT GOALS: to be able to walk and get up and down from a chair and do her routine daily activities without pain and without her knee giving way  NEXT MD VISIT: prn  OBJECTIVE:  Note: Objective measures were completed at  Evaluation unless otherwise noted.  DIAGNOSTIC FINDINGS:  05/30/22: RIGHT KNEE 3 VIEWS  COMPARISON:  Bilateral knee radiographs May 19th, 2015  FINDINGS: No acute fracture or dislocation. No joint effusion. Mild lateral compartment subchondral sclerosis and cystic changes with mild osteophytosis. No soft tissue abnormalities.  IMPRESSION: Mild lateral compartment osteoarthritis.  07/28/22 Left knee: Resulted by: Judi Saa, DO Performed: 07/28/22 1411 - 07/28/22 1411  Accession number: [id number] Resulting lab: [facility]  Narrative: Limited muscular skeletal ultrasound was performed and interpreted by Antoine Primas, M Limited ultrasound continues to show the patient does have some hypoechoic changes in the patellofemoral joint noted.  Some degenerative changes of the meniscus noted. Impression: Improvement in inflammation but still continue effusion    PATIENT SURVEYS:  Eval: FOTO 38, predicted 55 05/09/2023:  FOTO 49  COGNITION: Overall cognitive status: Within functional limits for tasks assessed     SENSATION: WFL   POSTURE:  slight knee valgus bilaterally  PALPATION: Mod crepitus noted on seated open chain flexion/extension  LOWER EXTREMITY ROM:  WNL LOWER EXTREMITY MMT:  Generally 4/5 with exception of bilateral knee flexion 4-/5 and bilateral hip abd 4-/5 and bilateral hip ER 3+/5  LOWER EXTREMITY SPECIAL TESTS:  Knee special tests: Patellafemoral grind test: positive   FUNCTIONAL TESTS:  Eval: 5 times sit to stand: 24 sec Timed up and go (TUG): 14.5 sec  05/03/2023: 5 times sit to stand:  18.36 sec without UE use Timed up and go (TUG): 8.00 sec 3 minute walk test: 612 ft without increased pain with RPE of 3/10  GAIT: Distance walked: 50 feet Assistive device utilized: None Level of assistance: Complete Independence Comments: antalgic, slow, right LE externally rotated, guarded, short step length   TODAY'S TREATMENT:    DATE:  05/24/2023 Recumbent bike level 1 x 8 min with PT present to discuss status Seated with 4# ankle weight:  LAQ  2x10 each bilat Seated with 4# ankle weight: sitting on purple cushion marching 10x right/left Seated no weight hip flexion up and over target on floor 10x Seated hamstring curls with green band (high on leg b/c of varicose veins) 15x bilat Lateral band walks with green band above knees with cue not to let legs touch 8 laps at the railing 4 inch step ups (long step)  2x 10 right/left  (single arm railing assist) Leg Press (seat at 7)  75# 2 x 10 (2nd set with heels lifted) Single Leg Press (seat 7) 35# 10x right/left  Standing bil calf raises 10x DATE: 05/19/2023 Recumbent bike level 1 x 7 min with PT present to discuss status Seated with 4# ankle weight:  LAQ  2x10 each bilat Seated with 4# ankle weight: sitting on purple cushion for some elevation up and over target on floor 10x right/left Seated hamstring curls with green band (high on  leg b/c of varicose veins) 2x10 bilat Lateral band walks with green band above knees with cue not to let legs touch 8 laps at the railing 4 inch step ups (long step) 2 x 10 right/left (feels unsteady on right uses railing assist) Leg Press (seat at 8) (try closer seat next time) 75# 2 x 10 (patient mentioned that she felt like the 80# was too much)  Single Leg Press (seat 8) 40# 2 x 10 bilat Standing bil calf raises 10x  DATE: 05/16/2023 Recumbent bike level 1 x 5 min with PT present to discuss status Seated with 4# ankle weight:  LAQ  2x10 each bilat Seated with 4# ankle weight:  marching x 20 Seated hip ER with 4# 2 x 10 each LE Seated hamstring curls with green tband 2x10 bilat Lateral band walks with blue loop at back counter x 3 laps (patient states she felt this in her lower leg?) 4 inch step ups 2 x 10 right/left (right 4/10 pain) 4 inch lateral step ups 2 x 10 right/left  Leg Press (seat at 8) 70# 2 x 10 (patient mentioned that she felt  like the 80# was too much) after first set of 10 she wanted to increase to 75# Single Leg Press (seat 8) 40# 2 x 10 bilat Discussed appropriate amount of activity at home and ok to use bike for exercise at home.   DATE: 05/09/2023 Recumbent bike level 2, x6 min with PT present to discuss status FOTO Seated with 4# ankle weight:  LAQ, marching. 2x10 each bilat Ambulation with 4# ankle weights down PT gym hallway and back Seated hamstring curls with green tband 2x10 bilat 3 way steps on 2" step (fwd, lateral, down) x8 each bilat Leg Press (seat at 7) 90# x10, 80# x10 Leg Press machine for calf raise (seat at 7) 80# 2x10 Standing calf stretch with toes on half foam bolster x20 sec   DATE: 05/05/2023 Recumbent bike level 1 x 10 min with PT present to discuss status Seated with 4# ankle weight:  LAQ, marching. 2x10 each bilat Seated hamstring curls with green tband 2x10 bilat 2 inch step ups 5x right/left (right 4/10 pain) 2 inch lateral step ups 5x right/left (feels wobbly balance- wise on right) Leg Press (seat at 8) 80# 10x 2 Single Leg Press (seat 8) 40# 15x bilat Standing at railing:  donkey kick (bent knee hip extension) and fire hydrants X10 each bilat   PATIENT EDUCATION:  Education details: Initiated HEP Person educated: Patient Education method: Programmer, multimedia, Facilities manager, Verbal cues, and Handouts Education comprehension: verbalized understanding, returned demonstration, and verbal cues required  HOME EXERCISE PROGRAM: Access Code: [email or web address] URL: [email or web address] Date: 04/12/2023 Prepared by: Lavinia Sharps  Exercises - Long Sitting Quad Set  - 1 x daily - 7 x weekly - 3 sets - 10 reps - Supine Knee Extension Strengthening  - 1 x daily - 7 x weekly - 3 sets - 10 reps - Seated Long Arc Quad  - 1 x daily - 7 x weekly - 3 sets - 10 reps - Straight Leg Raise  - 1 x daily - 7 x weekly - 1 sets - 10 reps - Clamshell  - 1 x daily - 7 x weekly - 1 sets - 10  reps - Seated Hamstring Curl with Anchored Resistance  - 1 x daily - 7 x weekly - 1 sets - 10 reps - Standing Heel Raise with Support  - 1 x daily - 7 x weekly -  1 sets - 10 reps  ASSESSMENT:  CLINICAL IMPRESSION: Despite recent fall with bruising and swelling noted (right knee especially) minimal modifications needed (decreased single leg press resistance and slight reduction in number of reps).  Patient states, "I think it's good I'm doing this.  I haven't moved since the fall happened."  Therapist closely monitoring response throughout treatment session.  OBJECTIVE IMPAIRMENTS: Abnormal gait, decreased balance, decreased knowledge of use of DME, decreased mobility, difficulty walking, decreased strength, increased edema, increased fascial restrictions, increased muscle spasms, impaired flexibility, postural dysfunction, obesity, and pain.   ACTIVITY LIMITATIONS: lifting, bending, sitting, standing, squatting, sleeping, stairs, transfers, bed mobility, bathing, toileting, dressing, and caring for others  PARTICIPATION LIMITATIONS: meal prep, cleaning, laundry, driving, shopping, community activity, yard work, and church  PERSONAL FACTORS: Fitness, Time since onset of injury/illness/exacerbation, and 1-2 comorbidities: anxiety and obesity  are also affecting patient's functional outcome.   REHAB POTENTIAL: Fair due to possible mechanical meniscus injury or joint deterioration  CLINICAL DECISION MAKING: Evolving/moderate complexity  EVALUATION COMPLEXITY: Moderate   GOALS: Goals reviewed with patient? Yes  SHORT TERM GOALS: Target date: 05/04/2023   Patient will be independent with initial HEP  Baseline: Goal status: MET on 05/03/2023  2.  Pain report to be no greater than 4/10  Baseline:  Goal status: Ongoing   LONG TERM GOALS: Target date: 06/01/2023   Patient to be independent with advanced HEP  Baseline:  Goal status: Ongoing  2.  Patient to report pain no greater  than 2/10  Baseline:  Goal status: INITIAL  3.  Patient to be able to bend, stoop and squat with pain no greater than 2/10  Baseline:  Goal status: INITIAL  4.  Patient to be able to ascend and descend steps without pain or no greater than 2/10  Baseline:  Goal status: Ongoing  5.  Patient to be able to sleep through the night  Baseline:  Goal status: Ongoing  6.  Patient to report 85% improvement in overall symptoms  Baseline:  Goal status: Ongoing   PLAN:  PT FREQUENCY: 1-2x/week  PT DURATION: 8 weeks  PLANNED INTERVENTIONS: 97110-Therapeutic exercises, 97530- Therapeutic activity, [id number]- Neuromuscular re-education, 97535- Self Care, 86578- Manual therapy, [id number]- Gait training, [phone]- Aquatic Therapy, [phone]- Splinting, 97014- Electrical stimulation (unattended), [id number]- Electrical stimulation (manual), 97016- Vasopneumatic device, [id number]- Ultrasound, [id number]- Ionotophoresis 4mg /ml Dexamethasone, Patient/Family education, Balance training, Stair training, Taping, Dry Needling, Joint mobilization, Scar mobilization, Compression bandaging, Vestibular training, Visual/preceptual remediation/compensation, Cryotherapy, and Moist heat  PLAN FOR NEXT SESSION: quad, HS, glute strengthening in unloaded positions and partial load; may need some modification secondary to recent fall  Lavinia Sharps, PT 05/24/23 1:43 PM Phone: [phone] Fax: [phone]

## 2023-05-25 ENCOUNTER — Ambulatory Visit: Payer: PPO | Admitting: Family Medicine

## 2023-05-26 ENCOUNTER — Ambulatory Visit: Payer: PPO | Admitting: Physical Therapy

## 2023-05-26 DIAGNOSIS — G8929 Other chronic pain: Secondary | ICD-10-CM

## 2023-05-26 DIAGNOSIS — M25661 Stiffness of right knee, not elsewhere classified: Secondary | ICD-10-CM

## 2023-05-26 DIAGNOSIS — M6281 Muscle weakness (generalized): Secondary | ICD-10-CM

## 2023-05-26 DIAGNOSIS — M25561 Pain in right knee: Secondary | ICD-10-CM | POA: Diagnosis not present

## 2023-05-26 NOTE — Therapy (Signed)
OUTPATIENT PHYSICAL THERAPY TREATMENT NOTE   Patient Name: Jean Smith MRN: [id number] DOB:Aug 31, 1950, 72 y.o., female Today's Date: 05/26/2023    END OF SESSION:  PT End of Session - 05/26/23 1108     Visit Number 14    Date for PT Re-Evaluation 06/01/23    Authorization Type [employer]    Progress Note Due on Visit 20    PT Start Time 1106    PT Stop Time 1145    PT Time Calculation (min) 39 min    Activity Tolerance Patient tolerated treatment well             Past Medical History:  Diagnosis Date   Allergy    Anxiety    Aortic regurgitation    moderate by echo 10/2020   Arthritis    Cancer (HCC)    skin   Cataract    bilateral - MD is just watching    Diverticulosis    Fuchs' endothelial dystrophy    follows with optho regularly    Gestational diabetes    Heart murmur    MVP    History of depression    HSV-2 infection    Hyperlipidemia    Hyperplastic colon polyp    Mitral valve prolapse    mild to moderate MR by echo 10/2020   PVC's (premature ventricular contractions)    intol of BB, sxc palpitations r/t stress   RVOT ventricular tachycardia/PVCs    EP eval 01/2013 for freq PVCs   Past Surgical History:  Procedure Laterality Date   CESAREAN SECTION     x 3   COLONOSCOPY     KNEE SURGERY Right    MANDIBLE SURGERY     right side in front of ear   MOHS SURGERY  2021   POLYPECTOMY     WISDOM TOOTH EXTRACTION     Patient Active Problem List   Diagnosis Date Noted   AC (acromioclavicular) arthritis 05/28/2022   Mitral valve prolapse    Aortic regurgitation    NSVT (nonsustained ventricular tachycardia) (HCC) 09/25/2020   Diabetes mellitus type 2 with complications (HCC) 08/27/2020   Chronic pain of both shoulders 05/05/2018   Other fatigue 01/02/2018   Allergic rhinitis 01/02/2018   Routine general medical examination at a health care facility 09/01/2015   Hyperlipidemia associated with type 2 diabetes mellitus (HCC)  09/01/2015   Varicose vein 08/27/2014   Primary localized osteoarthrosis, lower leg 10/30/2013   RVOT ventricular tachycardia/PVCs 01/31/2013   Abnormal stress echo 12/11/2012    PCP: Myrlene Broker, MD  REFERRING PROVIDER: Judi Saa, DO  REFERRING DIAG: [phone] (ICD-10-CM) - Chronic pain of right knee  THERAPY DIAG:  Chronic pain of right knee  Stiffness of right knee, not elsewhere classified  Muscle weakness (generalized)  Rationale for Evaluation and Treatment: Rehabilitation  ONSET DATE: 03/31/2023  SUBJECTIVE:   SUBJECTIVE STATEMENT: My muscles weren't sore after last time but the joint was (right knee).  It hurts to push foot into my shoe.  Right knee bruised and swollen.  I've got the brace ordered but I don't know if I need it b/c PT is helping.  It's the stairs that bothers me.  The single leg press is the hardest thing.   Tried a Don Joy sleeve but it compressed my blood vessels too much.  PERTINENT HISTORY: Knee scope approx 15 years ago (right knee) PAIN:   Are you having pain? Yes: NPRS scale: 4/10 Pain location: bil knees  right > left Pain description: aching, sharp Aggravating factors: walking, standing, bending,stooping, squatting Relieving factors: rest, meds  PRECAUTIONS: Fall  RED FLAGS: None   WEIGHT BEARING RESTRICTIONS: No  FALLS:  Has patient fallen in last 6 months? No  LIVING ENVIRONMENT: Lives with: lives with their spouse Lives in: House/apartment Stairs: Yes: Internal: 12 steps; on right going up and External: 5 steps; on right going up Has following equipment at home: None  OCCUPATION: retired  PLOF: Independent, Independent with basic ADLs, Independent with household mobility without device, Independent with community mobility without device, Independent with homemaking with ambulation, Independent with gait, and Independent with transfers  PATIENT GOALS: to be able to walk and get up and down from a chair  and do her routine daily activities without pain and without her knee giving way  NEXT MD VISIT: prn  OBJECTIVE:  Note: Objective measures were completed at Evaluation unless otherwise noted.  DIAGNOSTIC FINDINGS:  05/30/22: RIGHT KNEE 3 VIEWS  COMPARISON:  Bilateral knee radiographs May 19th, 2015  FINDINGS: No acute fracture or dislocation. No joint effusion. Mild lateral compartment subchondral sclerosis and cystic changes with mild osteophytosis. No soft tissue abnormalities.  IMPRESSION: Mild lateral compartment osteoarthritis.  07/28/22 Left knee: Resulted by: Judi Saa, DO Performed: 07/28/22 1411 - 07/28/22 1411  Accession number: [id number] Resulting lab: [facility]  Narrative: Limited muscular skeletal ultrasound was performed and interpreted by Antoine Primas, M Limited ultrasound continues to show the patient does have some hypoechoic changes in the patellofemoral joint noted.  Some degenerative changes of the meniscus noted. Impression: Improvement in inflammation but still continue effusion    PATIENT SURVEYS:  Eval: FOTO 38, predicted 55 05/09/2023:  FOTO 49  COGNITION: Overall cognitive status: Within functional limits for tasks assessed     SENSATION: WFL   POSTURE:  slight knee valgus bilaterally  PALPATION: Mod crepitus noted on seated open chain flexion/extension  LOWER EXTREMITY ROM:  WNL LOWER EXTREMITY MMT:  Generally 4/5 with exception of bilateral knee flexion 4-/5 and bilateral hip abd 4-/5 and bilateral hip ER 3+/5  LOWER EXTREMITY SPECIAL TESTS:  Knee special tests: Patellafemoral grind test: positive   FUNCTIONAL TESTS:  Eval: 5 times sit to stand: 24 sec Timed up and go (TUG): 14.5 sec  05/03/2023: 5 times sit to stand:  18.36 sec without UE use Timed up and go (TUG): 8.00 sec 3 minute walk test: 612 ft without increased pain with RPE of 3/10  GAIT: Distance walked: 50 feet Assistive device utilized:  None Level of assistance: Complete Independence Comments: antalgic, slow, right LE externally rotated, guarded, short step length   TODAY'S TREATMENT:    DATE: 05/26/2023 Recumbent bike level 1 x 8 min with PT present to discuss status Mini trampoline:weight shifting side to side, front to back, marching, light bounce 1 min each Medium pink TKEs bil 20x Seated HS curl red power cord and slider 20x right/left Spanish squat style/sit to stand  with medium power cord behind knees 3x (painful so discontinued) Seated single leg press into red ball isometric 5 sec holds 10x right/left Leg Press (seat at 7)  75# 15x Single Leg Press (seat 7) 35# 15x right/left (turns toe in for comfort)  DATE: 05/24/2023 Recumbent bike level 1 x 8 min with PT present to discuss status Seated with 4# ankle weight:  LAQ  2x10 each bilat Seated with 4# ankle weight: sitting on purple cushion marching 10x right/left Seated no weight hip flexion up and  over target on floor 10x Seated hamstring curls with green band (high on leg b/c of varicose veins) 15x bilat Lateral band walks with green band above knees with cue not to let legs touch 8 laps at the railing 4 inch step ups (long step)  2x 10 right/left  (single arm railing assist) Leg Press (seat at 7)  75# 2 x 10 (2nd set with heels lifted) Single Leg Press (seat 7) 35# 10x right/left  Standing bil calf raises 10x DATE: 05/19/2023 Recumbent bike level 1 x 7 min with PT present to discuss status Seated with 4# ankle weight:  LAQ  2x10 each bilat Seated with 4# ankle weight: sitting on purple cushion for some elevation up and over target on floor 10x right/left Seated hamstring curls with green band (high on leg b/c of varicose veins) 2x10 bilat Lateral band walks with green band above knees with cue not to let legs touch 8 laps at the railing 4 inch step ups (long step) 2 x 10 right/left (feels unsteady on right uses railing assist) Leg Press (seat at 8) (try  closer seat next time) 75# 2 x 10 (patient mentioned that she felt like the 80# was too much)  Single Leg Press (seat 8) 40# 2 x 10 bilat Standing bil calf raises 10x  DATE: 05/16/2023 Recumbent bike level 1 x 5 min with PT present to discuss status Seated with 4# ankle weight:  LAQ  2x10 each bilat Seated with 4# ankle weight:  marching x 20 Seated hip ER with 4# 2 x 10 each LE Seated hamstring curls with green tband 2x10 bilat Lateral band walks with blue loop at back counter x 3 laps (patient states she felt this in her lower leg?) 4 inch step ups 2 x 10 right/left (right 4/10 pain) 4 inch lateral step ups 2 x 10 right/left  Leg Press (seat at 8) 70# 2 x 10 (patient mentioned that she felt like the 80# was too much) after first set of 10 she wanted to increase to 75# Single Leg Press (seat 8) 40# 2 x 10 bilat Discussed appropriate amount of activity at home and ok to use bike for exercise at home.   DATE: 05/09/2023 Recumbent bike level 2, x6 min with PT present to discuss status FOTO Seated with 4# ankle weight:  LAQ, marching. 2x10 each bilat Ambulation with 4# ankle weights down PT gym hallway and back Seated hamstring curls with green tband 2x10 bilat 3 way steps on 2" step (fwd, lateral, down) x8 each bilat Leg Press (seat at 7) 90# x10, 80# x10 Leg Press machine for calf raise (seat at 7) 80# 2x10 Standing calf stretch with toes on half foam bolster x20 sec    PATIENT EDUCATION:  Education details: Initiated HEP Person educated: Patient Education method: Programmer, multimedia, Facilities manager, Verbal cues, and Handouts Education comprehension: verbalized understanding, returned demonstration, and verbal cues required  HOME EXERCISE PROGRAM: Access Code: [email or web address] URL: [email or web address] Date: 04/12/2023 Prepared by: Lavinia Sharps  Exercises - Long Sitting Quad Set  - 1 x daily - 7 x weekly - 3 sets - 10 reps - Supine Knee Extension Strengthening  - 1 x daily -  7 x weekly - 3 sets - 10 reps - Seated Long Arc Quad  - 1 x daily - 7 x weekly - 3 sets - 10 reps - Straight Leg Raise  - 1 x daily - 7 x weekly - 1 sets - 10 reps - Clamshell  - 1  x daily - 7 x weekly - 1 sets - 10 reps - Seated Hamstring Curl with Anchored Resistance  - 1 x daily - 7 x weekly - 1 sets - 10 reps - Standing Heel Raise with Support  - 1 x daily - 7 x weekly - 1 sets - 10 reps  ASSESSMENT:  CLINICAL IMPRESSION:  Bruising and swelling persists in right knee from recent fall.  She expresses interest in a home mini trampoline and patient was successful on the clinic trampoline with weight shifting and marching with UE support on bar.  Increased resistance with HS curls and added quad isometric push and banded TKEs without pain exacerbation.  Therapist closely monitoring pain throughout session and modifying ex's as needed.   OBJECTIVE IMPAIRMENTS: Abnormal gait, decreased balance, decreased knowledge of use of DME, decreased mobility, difficulty walking, decreased strength, increased edema, increased fascial restrictions, increased muscle spasms, impaired flexibility, postural dysfunction, obesity, and pain.   ACTIVITY LIMITATIONS: lifting, bending, sitting, standing, squatting, sleeping, stairs, transfers, bed mobility, bathing, toileting, dressing, and caring for others  PARTICIPATION LIMITATIONS: meal prep, cleaning, laundry, driving, shopping, community activity, yard work, and church  PERSONAL FACTORS: Fitness, Time since onset of injury/illness/exacerbation, and 1-2 comorbidities: anxiety and obesity  are also affecting patient's functional outcome.   REHAB POTENTIAL: Fair due to possible mechanical meniscus injury or joint deterioration  CLINICAL DECISION MAKING: Evolving/moderate complexity  EVALUATION COMPLEXITY: Moderate   GOALS: Goals reviewed with patient? Yes  SHORT TERM GOALS: Target date: 05/04/2023   Patient will be independent with initial HEP   Baseline: Goal status: MET on 05/03/2023  2.  Pain report to be no greater than 4/10  Baseline:  Goal status: Ongoing   LONG TERM GOALS: Target date: 06/01/2023   Patient to be independent with advanced HEP  Baseline:  Goal status: Ongoing  2.  Patient to report pain no greater than 2/10  Baseline:  Goal status: INITIAL  3.  Patient to be able to bend, stoop and squat with pain no greater than 2/10  Baseline:  Goal status: INITIAL  4.  Patient to be able to ascend and descend steps without pain or no greater than 2/10  Baseline:  Goal status: Ongoing  5.  Patient to be able to sleep through the night  Baseline:  Goal status: Ongoing  6.  Patient to report 85% improvement in overall symptoms  Baseline:  Goal status: Ongoing   PLAN:  PT FREQUENCY: 1-2x/week  PT DURATION: 8 weeks  PLANNED INTERVENTIONS: 97110-Therapeutic exercises, 97530- Therapeutic activity, [id number]- Neuromuscular re-education, 97535- Self Care, 54098- Manual therapy, [id number]- Gait training, [phone]- Aquatic Therapy, [phone]- Splinting, 97014- Electrical stimulation (unattended), [id number]- Electrical stimulation (manual), 97016- Vasopneumatic device, [id number]- Ultrasound, [id number]- Ionotophoresis 4mg /ml Dexamethasone, Patient/Family education, Balance training, Stair training, Taping, Dry Needling, Joint mobilization, Scar mobilization, Compression bandaging, Vestibular training, Visual/preceptual remediation/compensation, Cryotherapy, and Moist heat  PLAN FOR NEXT SESSION: ERO in 1 week; quad, HS, glute strengthening in unloaded positions and partial load; may need some modification secondary to recent fall  Lavinia Sharps, PT 05/26/23 1:31 PM Phone: [phone] Fax: [phone]

## 2023-05-30 ENCOUNTER — Ambulatory Visit: Payer: PPO | Admitting: Rehabilitative and Restorative Service Providers"

## 2023-05-30 ENCOUNTER — Encounter: Payer: Self-pay | Admitting: Rehabilitative and Restorative Service Providers"

## 2023-05-30 DIAGNOSIS — M6281 Muscle weakness (generalized): Secondary | ICD-10-CM

## 2023-05-30 DIAGNOSIS — M25661 Stiffness of right knee, not elsewhere classified: Secondary | ICD-10-CM

## 2023-05-30 DIAGNOSIS — M25561 Pain in right knee: Secondary | ICD-10-CM | POA: Diagnosis not present

## 2023-05-30 DIAGNOSIS — R262 Difficulty in walking, not elsewhere classified: Secondary | ICD-10-CM

## 2023-05-30 DIAGNOSIS — R252 Cramp and spasm: Secondary | ICD-10-CM

## 2023-05-30 DIAGNOSIS — G8929 Other chronic pain: Secondary | ICD-10-CM

## 2023-05-30 NOTE — Therapy (Signed)
OUTPATIENT PHYSICAL THERAPY TREATMENT NOTE   Patient Name: Jean Smith MRN: [id number] DOB:05-13-1951, 72 y.o., female Today's Date: 05/30/2023    END OF SESSION:  PT End of Session - 05/30/23 0935     Visit Number 15    Date for PT Re-Evaluation 06/01/23    Authorization Type [employer]    Progress Note Due on Visit 20    PT Start Time 0933    PT Stop Time 1011    PT Time Calculation (min) 38 min    Activity Tolerance Patient tolerated treatment well    Behavior During Therapy [facility] for tasks assessed/performed             Past Medical History:  Diagnosis Date   Allergy    Anxiety    Aortic regurgitation    moderate by echo 10/2020   Arthritis    Cancer (HCC)    skin   Cataract    bilateral - MD is just watching    Diverticulosis    Fuchs' endothelial dystrophy    follows with optho regularly    Gestational diabetes    Heart murmur    MVP    History of depression    HSV-2 infection    Hyperlipidemia    Hyperplastic colon polyp    Mitral valve prolapse    mild to moderate MR by echo 10/2020   PVC's (premature ventricular contractions)    intol of BB, sxc palpitations r/t stress   RVOT ventricular tachycardia/PVCs    EP eval 01/2013 for freq PVCs   Past Surgical History:  Procedure Laterality Date   CESAREAN SECTION     x 3   COLONOSCOPY     KNEE SURGERY Right    MANDIBLE SURGERY     right side in front of ear   MOHS SURGERY  2021   POLYPECTOMY     WISDOM TOOTH EXTRACTION     Patient Active Problem List   Diagnosis Date Noted   AC (acromioclavicular) arthritis 05/28/2022   Mitral valve prolapse    Aortic regurgitation    NSVT (nonsustained ventricular tachycardia) (HCC) 09/25/2020   Diabetes mellitus type 2 with complications (HCC) 08/27/2020   Chronic pain of both shoulders 05/05/2018   Other fatigue 01/02/2018   Allergic rhinitis 01/02/2018   Routine general medical examination at a health care facility 09/01/2015    Hyperlipidemia associated with type 2 diabetes mellitus (HCC) 09/01/2015   Varicose vein 08/27/2014   Primary localized osteoarthrosis, lower leg 10/30/2013   RVOT ventricular tachycardia/PVCs 01/31/2013   Abnormal stress echo 12/11/2012    PCP: Myrlene Broker, MD  REFERRING PROVIDER: Judi Saa, DO  REFERRING DIAG: [phone] (ICD-10-CM) - Chronic pain of right knee  THERAPY DIAG:  Chronic pain of right knee  Stiffness of right knee, not elsewhere classified  Muscle weakness (generalized)  Difficulty in walking, not elsewhere classified  Cramp and spasm  Rationale for Evaluation and Treatment: Rehabilitation  ONSET DATE: 03/31/2023  SUBJECTIVE:   SUBJECTIVE STATEMENT: Patient reports having increased pain today.  States that she still has not used her bike at home.  Patient reports that she had 2 different outings yesterday where she was sitting, near stationary, for 3 hours each.  Reports increased pain following.  Patient reports bilateral knee pain today.  Pt reports that she is still sore from her fall and goes to the MD later this week.  PERTINENT HISTORY: Knee scope approx 15 years ago (right knee) PAIN:  Are you having pain? Yes: NPRS scale: 5/10 Pain location: bil knees right > left Pain description: aching, sharp Aggravating factors: walking, standing, bending,stooping, squatting Relieving factors: rest, meds  PRECAUTIONS: Fall  RED FLAGS: None   WEIGHT BEARING RESTRICTIONS: No  FALLS:  Has patient fallen in last 6 months? No  LIVING ENVIRONMENT: Lives with: lives with their spouse Lives in: House/apartment Stairs: Yes: Internal: 12 steps; on right going up and External: 5 steps; on right going up Has following equipment at home: None  OCCUPATION: retired  PLOF: Independent, Independent with basic ADLs, Independent with household mobility without device, Independent with community mobility without device, Independent with  homemaking with ambulation, Independent with gait, and Independent with transfers  PATIENT GOALS: to be able to walk and get up and down from a chair and do her routine daily activities without pain and without her knee giving way  NEXT MD VISIT: prn  OBJECTIVE:  Note: Objective measures were completed at Evaluation unless otherwise noted.  DIAGNOSTIC FINDINGS:  05/30/22: RIGHT KNEE 3 VIEWS  COMPARISON:  Bilateral knee radiographs May 19th, 2015  FINDINGS: No acute fracture or dislocation. No joint effusion. Mild lateral compartment subchondral sclerosis and cystic changes with mild osteophytosis. No soft tissue abnormalities.  IMPRESSION: Mild lateral compartment osteoarthritis.  07/28/22 Left knee: Resulted by: Judi Saa, DO Performed: 07/28/22 1411 - 07/28/22 1411  Accession number: [id number] Resulting lab: [facility]  Narrative: Limited muscular skeletal ultrasound was performed and interpreted by Antoine Primas, M Limited ultrasound continues to show the patient does have some hypoechoic changes in the patellofemoral joint noted.  Some degenerative changes of the meniscus noted. Impression: Improvement in inflammation but still continue effusion    PATIENT SURVEYS:  Eval: FOTO 38, predicted 55 05/09/2023:  FOTO 49  COGNITION: Overall cognitive status: Within functional limits for tasks assessed     SENSATION: WFL   POSTURE:  slight knee valgus bilaterally  PALPATION: Mod crepitus noted on seated open chain flexion/extension  LOWER EXTREMITY ROM:  WNL LOWER EXTREMITY MMT:  Generally 4/5 with exception of bilateral knee flexion 4-/5 and bilateral hip abd 4-/5 and bilateral hip ER 3+/5  LOWER EXTREMITY SPECIAL TESTS:  Knee special tests: Patellafemoral grind test: positive   FUNCTIONAL TESTS:  Eval: 5 times sit to stand: 24 sec Timed up and go (TUG): 14.5 sec  05/03/2023: 5 times sit to stand:  18.36 sec without UE use Timed up and go  (TUG): 8.00 sec 3 minute walk test: 612 ft without increased pain with RPE of 3/10  GAIT: Distance walked: 50 feet Assistive device utilized: None Level of assistance: Complete Independence Comments: antalgic, slow, right LE externally rotated, guarded, short step length   TODAY'S TREATMENT:     DATE: 05/30/2023 Recumbent bike level 2, x6 min with PT present to discuss status Seated hamstring stretch 2x20 sec bilat Seated with 4# ankle weight:  LAQ and marching.  2x10 each bilat Standing high marching 15 ft down hallway and back with 4# ankle weights Side stepping with 4# ankle weights 2x10 ft bilaterally Seated hamstring curl with green tband 2x10 Box stepping with 4 dots on floor x5 each direction Leg Press (seat at 7) 80# 2x10 Single Leg Press 40# x10 left LE, 30# x10 right LE   DATE: 05/26/2023 Recumbent bike level 1 x 8 min with PT present to discuss status Mini trampoline:weight shifting side to side, front to back, marching, light bounce 1 min each Medium pink TKEs bil 20x Seated  HS curl red power cord and slider 20x right/left Spanish squat style/sit to stand  with medium power cord behind knees 3x (painful so discontinued) Seated single leg press into red ball isometric 5 sec holds 10x right/left Leg Press (seat at 7)  75# 15x Single Leg Press (seat 7) 35# 15x right/left (turns toe in for comfort)  DATE: 05/24/2023 Recumbent bike level 1 x 8 min with PT present to discuss status Seated with 4# ankle weight:  LAQ  2x10 each bilat Seated with 4# ankle weight: sitting on purple cushion marching 10x right/left Seated no weight hip flexion up and over target on floor 10x Seated hamstring curls with green band (high on leg b/c of varicose veins) 15x bilat Lateral band walks with green band above knees with cue not to let legs touch 8 laps at the railing 4 inch step ups (long step)  2x 10 right/left  (single arm railing assist) Leg Press (seat at 7)  75# 2 x 10 (2nd set  with heels lifted) Single Leg Press (seat 7) 35# 10x right/left  Standing bil calf raises 10x    PATIENT EDUCATION:  Education details: Initiated HEP Person educated: Patient Education method: Programmer, multimedia, Facilities manager, Verbal cues, and Handouts Education comprehension: verbalized understanding, returned demonstration, and verbal cues required  HOME EXERCISE PROGRAM: Access Code: [email or web address] URL: [email or web address] Date: 04/12/2023 Prepared by: Lavinia Sharps  Exercises - Long Sitting Quad Set  - 1 x daily - 7 x weekly - 3 sets - 10 reps - Supine Knee Extension Strengthening  - 1 x daily - 7 x weekly - 3 sets - 10 reps - Seated Long Arc Quad  - 1 x daily - 7 x weekly - 3 sets - 10 reps - Straight Leg Raise  - 1 x daily - 7 x weekly - 1 sets - 10 reps - Clamshell  - 1 x daily - 7 x weekly - 1 sets - 10 reps - Seated Hamstring Curl with Anchored Resistance  - 1 x daily - 7 x weekly - 1 sets - 10 reps - Standing Heel Raise with Support  - 1 x daily - 7 x weekly - 1 sets - 10 reps  ASSESSMENT:  CLINICAL IMPRESSION: Ms Clasen continues to present with increased pain today.  Patient reports that her pain seemed to increase after she was in seated position for 2 different functions for approximately 3 hours each over the weekend.  Educated patient on the importance of not staying in seated position for prolonged periods and the importance of standing up and walking around to decrease stiffness and increased pain.  Patient verbalizes her understanding and that she should have gotten on her bike between 2 activities, but states that she still has not used the bike at home.  Patient continues to report increased pain in her knee since the fall and that she will follow up with MD later this week.  Patient with difficulty with single leg press on right leg and had to lower the resistance on her right side.  Patient to have reassessment visit next visit to assess for likely need for  continued PT services.  OBJECTIVE IMPAIRMENTS: Abnormal gait, decreased balance, decreased knowledge of use of DME, decreased mobility, difficulty walking, decreased strength, increased edema, increased fascial restrictions, increased muscle spasms, impaired flexibility, postural dysfunction, obesity, and pain.   ACTIVITY LIMITATIONS: lifting, bending, sitting, standing, squatting, sleeping, stairs, transfers, bed mobility, bathing, toileting, dressing, and caring for others  PARTICIPATION LIMITATIONS: meal prep,  cleaning, laundry, driving, shopping, community activity, yard work, and church  PERSONAL FACTORS: Fitness, Time since onset of injury/illness/exacerbation, and 1-2 comorbidities: anxiety and obesity  are also affecting patient's functional outcome.   REHAB POTENTIAL: Fair due to possible mechanical meniscus injury or joint deterioration  CLINICAL DECISION MAKING: Evolving/moderate complexity  EVALUATION COMPLEXITY: Moderate   GOALS: Goals reviewed with patient? Yes  SHORT TERM GOALS: Target date: 05/04/2023   Patient will be independent with initial HEP  Baseline: Goal status: MET on 05/03/2023  2.  Pain report to be no greater than 4/10  Baseline:  Goal status: Ongoing   LONG TERM GOALS: Target date: 06/01/2023   Patient to be independent with advanced HEP  Baseline:  Goal status: Ongoing  2.  Patient to report pain no greater than 2/10  Baseline:  Goal status: INITIAL  3.  Patient to be able to bend, stoop and squat with pain no greater than 2/10  Baseline:  Goal status: INITIAL  4.  Patient to be able to ascend and descend steps without pain or no greater than 2/10  Baseline:  Goal status: Ongoing  5.  Patient to be able to sleep through the night  Baseline:  Goal status: Ongoing  6.  Patient to report 85% improvement in overall symptoms  Baseline:  Goal status: Ongoing   PLAN:  PT FREQUENCY: 1-2x/week  PT DURATION: 8 weeks  PLANNED  INTERVENTIONS: 97110-Therapeutic exercises, 97530- Therapeutic activity, [id number]- Neuromuscular re-education, 97535- Self Care, 16109- Manual therapy, [id number]- Gait training, [phone]- Aquatic Therapy, [phone]- Splinting, 97014- Electrical stimulation (unattended), [id number]- Electrical stimulation (manual), 97016- Vasopneumatic device, [id number]- Ultrasound, [id number]- Ionotophoresis 4mg /ml Dexamethasone, Patient/Family education, Balance training, Stair training, Taping, Dry Needling, Joint mobilization, Scar mobilization, Compression bandaging, Vestibular training, Visual/preceptual remediation/compensation, Cryotherapy, and Moist heat  PLAN FOR NEXT SESSION: Reassessment/ERO; quad, HS, glute strengthening in unloaded positions and partial load; may need some modification secondary to recent fall   Steadman Prosperi, PT, DPT 05/30/23, 11:59 AM  [facility] [address] [place], [place] [address] Phone # [phone] Fax [phone]

## 2023-05-31 DIAGNOSIS — M1711 Unilateral primary osteoarthritis, right knee: Secondary | ICD-10-CM | POA: Diagnosis not present

## 2023-06-01 ENCOUNTER — Ambulatory Visit: Payer: PPO | Admitting: Physical Therapy

## 2023-06-01 DIAGNOSIS — M6281 Muscle weakness (generalized): Secondary | ICD-10-CM

## 2023-06-01 DIAGNOSIS — M25661 Stiffness of right knee, not elsewhere classified: Secondary | ICD-10-CM

## 2023-06-01 DIAGNOSIS — M25561 Pain in right knee: Secondary | ICD-10-CM | POA: Diagnosis not present

## 2023-06-01 DIAGNOSIS — G8929 Other chronic pain: Secondary | ICD-10-CM

## 2023-06-01 NOTE — Therapy (Signed)
OUTPATIENT PHYSICAL THERAPY TREATMENT NOTE/RECERTIFICATION   Patient Name: Jean Smith MRN: [id number] DOB:[birth year], 72 y.o., female Today's Date: 06/01/2023    END OF SESSION:  PT End of Session - 06/01/23 1022     Visit Number 16    Date for PT Re-Evaluation 07/27/23    Authorization Type [employer]    Progress Note Due on Visit 20    PT Start Time 1020    PT Stop Time 1100    PT Time Calculation (min) 40 min    Activity Tolerance Patient tolerated treatment well             Past Medical History:  Diagnosis Date   Allergy    Anxiety    Aortic regurgitation    moderate by echo 10/2020   Arthritis    Cancer (HCC)    skin   Cataract    bilateral - MD is just watching    Diverticulosis    Fuchs' endothelial dystrophy    follows with optho regularly    Gestational diabetes    Heart murmur    MVP    History of depression    HSV-2 infection    Hyperlipidemia    Hyperplastic colon polyp    Mitral valve prolapse    mild to moderate MR by echo 10/2020   PVC's (premature ventricular contractions)    intol of BB, sxc palpitations r/t stress   RVOT ventricular tachycardia/PVCs    EP eval 01/2013 for freq PVCs   Past Surgical History:  Procedure Laterality Date   CESAREAN SECTION     x 3   COLONOSCOPY     KNEE SURGERY Right    MANDIBLE SURGERY     right side in front of ear   MOHS SURGERY  2021   POLYPECTOMY     WISDOM TOOTH EXTRACTION     Patient Active Problem List   Diagnosis Date Noted   AC (acromioclavicular) arthritis 05/28/2022   Mitral valve prolapse    Aortic regurgitation    NSVT (nonsustained ventricular tachycardia) (HCC) 09/25/2020   Diabetes mellitus type 2 with complications (HCC) 08/27/2020   Chronic pain of both shoulders 05/05/2018   Other fatigue 01/02/2018   Allergic rhinitis 01/02/2018   Routine general medical examination at a health care facility 09/01/2015   Hyperlipidemia associated with type 2 diabetes  mellitus (HCC) 09/01/2015   Varicose vein 08/27/2014   Primary localized osteoarthrosis, lower leg 10/30/2013   RVOT ventricular tachycardia/PVCs 01/31/2013   Abnormal stress echo 12/11/2012    PCP: Myrlene Broker, MD  REFERRING PROVIDER: Judi Saa, DO  REFERRING DIAG: [phone] (ICD-10-CM) - Chronic pain of right knee  THERAPY DIAG:  Chronic pain of right knee  Stiffness of right knee, not elsewhere classified  Muscle weakness (generalized)  Rationale for Evaluation and Treatment: Rehabilitation  ONSET DATE: 03/31/2023  SUBJECTIVE:   SUBJECTIVE STATEMENT: I think the walking with weights bothered me last time.  I stood all day making fruit cakes with my other son yesterday.  Some swelling this morning, my knee brace is at the other house or else I would have put it on.  Pain is sharp when I stand up. PERTINENT HISTORY: Knee scope approx 15 years ago (right knee) PAIN:   Are you having pain? Yes: NPRS scale: at least a 5/10 Pain location: bil knees right > left Pain description: aching, sharp Aggravating factors: walking, standing, bending,stooping, squatting Relieving factors: rest, meds  PRECAUTIONS: Fall  RED FLAGS:  None   WEIGHT BEARING RESTRICTIONS: No  FALLS:  Has patient fallen in last 6 months? No  LIVING ENVIRONMENT: Lives with: lives with their spouse Lives in: House/apartment Stairs: Yes: Internal: 12 steps; on right going up and External: 5 steps; on right going up Has following equipment at home: None  OCCUPATION: retired  PLOF: Independent, Independent with basic ADLs, Independent with household mobility without device, Independent with community mobility without device, Independent with homemaking with ambulation, Independent with gait, and Independent with transfers  PATIENT GOALS: to be able to walk and get up and down from a chair and do her routine daily activities without pain and without her knee giving way  NEXT MD  VISIT: prn  OBJECTIVE:  Note: Objective measures were completed at Evaluation unless otherwise noted.  DIAGNOSTIC FINDINGS:  05/30/22: RIGHT KNEE 3 VIEWS  COMPARISON:  Bilateral knee radiographs May 19th, 2015  FINDINGS: No acute fracture or dislocation. No joint effusion. Mild lateral compartment subchondral sclerosis and cystic changes with mild osteophytosis. No soft tissue abnormalities.  IMPRESSION: Mild lateral compartment osteoarthritis.  07/28/22 Left knee: Resulted by: Judi Saa, DO Performed: 07/28/22 1411 - 07/28/22 1411  Accession number: [id number] Resulting lab: [facility]  Narrative: Limited muscular skeletal ultrasound was performed and interpreted by Antoine Primas, M Limited ultrasound continues to show the patient does have some hypoechoic changes in the patellofemoral joint noted.  Some degenerative changes of the meniscus noted. Impression: Improvement in inflammation but still continue effusion    PATIENT SURVEYS:  Eval: FOTO 38, predicted 55 05/09/2023:  FOTO 49 12/18:  54  COGNITION: Overall cognitive status: Within functional limits for tasks assessed     SENSATION: WFL   POSTURE:  slight knee valgus bilaterally  PALPATION: Mod crepitus noted on seated open chain flexion/extension  LOWER EXTREMITY ROM:  WNL LOWER EXTREMITY MMT:  Generally 4/5 with exception of bilateral knee flexion 4-/5 and bilateral hip abd 4-/5 and bilateral hip ER 3+/5  LOWER EXTREMITY SPECIAL TESTS:  Knee special tests: Patellafemoral grind test: positive   FUNCTIONAL TESTS:  Eval: 5 times sit to stand: 24 sec Timed up and go (TUG): 14.5 sec  05/03/2023: 5 times sit to stand:  18.36 sec without UE use Timed up and go (TUG): 8.00 sec 3 minute walk test: 612 ft without increased pain with RPE of 3/10  12/18 5x 20.16 (painful secondary to recent fall and prolonged standing yesterday) 6 MWT 1140 3/10 knee pain, back pain  3 MWT 550  feet  GAIT: Distance walked: 50 feet Assistive device utilized: None Level of assistance: Complete Independence Comments: antalgic, slow, right LE externally rotated, guarded, short step length   TODAY'S TREATMENT:    DATE: 05/26/2023 Recumbent bike level 1 x 10 min with PT present to discuss status FOTO Goal setting 6 MWT 5x STS Green band HS curls (band placed high on leg for comfort) 15x right/left Leg Press (seat at 7)  70# 20x 2 rounds  DATE: 05/30/2023 Recumbent bike level 2 6 min with PT present to discuss status Seated hamstring stretch 2x20 sec bilat Seated with 4# ankle weight:  LAQ and marching.  2x10 each bilat Standing high marching 15 ft down hallway and back with 4# ankle weights Side stepping with 4# ankle weights 2x10 ft bilaterally Seated hamstring curl with green tband 2x10 Box stepping with 4 dots on floor x5 each direction Leg Press (seat at 7) 80# 2x10 Single Leg Press 40# x10 left LE, 30# x10 right  LE   DATE: 05/26/2023 Recumbent bike level 1 x 8 min with PT present to discuss status Mini trampoline:weight shifting side to side, front to back, marching, light bounce 1 min each Medium pink TKEs bil 20x Seated HS curl red power cord and slider 20x right/left Spanish squat style/sit to stand  with medium power cord behind knees 3x (painful so discontinued) Seated single leg press into red ball isometric 5 sec holds 10x right/left Leg Press (seat at 7)  75# 15x Single Leg Press (seat 7) 35# 15x right/left (turns toe in for comfort)  DATE: 05/24/2023 Recumbent bike level 1 x 8 min with PT present to discuss status Seated with 4# ankle weight:  LAQ  2x10 each bilat Seated with 4# ankle weight: sitting on purple cushion marching 10x right/left Seated no weight hip flexion up and over target on floor 10x Seated hamstring curls with green band (high on leg b/c of varicose veins) 15x bilat Lateral band walks with green band above knees with cue not to let  legs touch 8 laps at the railing 4 inch step ups (long step)  2x 10 right/left  (single arm railing assist) Leg Press (seat at 7)  75# 2 x 10 (2nd set with heels lifted) Single Leg Press (seat 7) 35# 10x right/left  Standing bil calf raises 10x    PATIENT EDUCATION:  Education details: Initiated HEP Person educated: Patient Education method: Programmer, multimedia, Facilities manager, Verbal cues, and Handouts Education comprehension: verbalized understanding, returned demonstration, and verbal cues required  HOME EXERCISE PROGRAM: Access Code: [email or web address] URL: [email or web address] Date: 04/12/2023 Prepared by: Lavinia Sharps  Exercises - Long Sitting Quad Set  - 1 x daily - 7 x weekly - 3 sets - 10 reps - Supine Knee Extension Strengthening  - 1 x daily - 7 x weekly - 3 sets - 10 reps - Seated Long Arc Quad  - 1 x daily - 7 x weekly - 3 sets - 10 reps - Straight Leg Raise  - 1 x daily - 7 x weekly - 1 sets - 10 reps - Clamshell  - 1 x daily - 7 x weekly - 1 sets - 10 reps - Seated Hamstring Curl with Anchored Resistance  - 1 x daily - 7 x weekly - 1 sets - 10 reps - Standing Heel Raise with Support  - 1 x daily - 7 x weekly - 1 sets - 10 reps  ASSESSMENT:  CLINICAL IMPRESSION: Patient has responded well to PT overall but did a recent set back with a fall 1 1/2 weeks ago.  She also has an increase in pain today after prolonged standing in the kitchen yesterday.  Despite these occurrences, her FOTO functional outcome score has improved since start of care as well as her STS speed and walking speed. She would benefit for further skilled PT for an appropriate progression of strengthening needed for a return to the highest functional level possible with ADLs.  Will continue to update her HEP and promote independence.     OBJECTIVE IMPAIRMENTS: Abnormal gait, decreased balance, decreased knowledge of use of DME, decreased mobility, difficulty walking, decreased strength, increased edema,  increased fascial restrictions, increased muscle spasms, impaired flexibility, postural dysfunction, obesity, and pain.   ACTIVITY LIMITATIONS: lifting, bending, sitting, standing, squatting, sleeping, stairs, transfers, bed mobility, bathing, toileting, dressing, and caring for others  PARTICIPATION LIMITATIONS: meal prep, cleaning, laundry, driving, shopping, community activity, yard work, and church  PERSONAL FACTORS: Fitness, Time since onset of injury/illness/exacerbation, and  1-2 comorbidities: anxiety and obesity  are also affecting patient's functional outcome.   REHAB POTENTIAL: Fair due to possible mechanical meniscus injury or joint deterioration  CLINICAL DECISION MAKING: Evolving/moderate complexity  EVALUATION COMPLEXITY: Moderate   GOALS: Goals reviewed with patient? Yes  SHORT TERM GOALS: Target date: 05/04/2023   Patient will be independent with initial HEP  Baseline: Goal status: MET on 05/03/2023  2.  Pain report to be no greater than 4/10  Baseline:  Goal status: Ongoing   LONG TERM GOALS: Target date: 07/27/2023   Patient to be independent with advanced HEP  Baseline:  Goal status: Ongoing  2.  Patient to report pain no greater than 2/10  Baseline:  Goal status: INITIAL  3.  Patient to be able to bend, stoop and squat with pain no greater than 2/10  Baseline:  Goal status: INITIAL  4.  Patient to be able to ascend and descend steps without pain or no greater than 2/10  Baseline:  Goal status: Ongoing  5.  Patient to be able to sleep through the night  Baseline:  Goal status: Ongoing  6.  Patient to report 85% improvement in overall symptoms  Baseline:  Goal status: Ongoing 7.  Six minute walk test 1200 feet indicating improved gait speed and strength.  new  8. Able to rise sit to stand from a standard chair with minimal knee pain (5x STS <17 sec) new   PLAN:  PT FREQUENCY: 1-2x/week  PT DURATION: 8 weeks  PLANNED INTERVENTIONS:  97110-Therapeutic exercises, 97530- Therapeutic activity, [id number]- Neuromuscular re-education, 97535- Self Care, 21308- Manual therapy, [id number]- Gait training, [phone]- Aquatic Therapy, [id number]- Splinting, 97014- Electrical stimulation (unattended), [id number]- Electrical stimulation (manual), 97016- Vasopneumatic device, [id number]- Ultrasound, [id number]- Ionotophoresis 4mg /ml Dexamethasone, Patient/Family education, Balance training, Stair training, Taping, Dry Needling, Joint mobilization, Scar mobilization, Compression bandaging, Vestibular training, Visual/preceptual remediation/compensation, Cryotherapy, and Moist heat  PLAN FOR NEXT SESSION:  quad, HS, glute strengthening in unloaded positions and partial load; may need some modification secondary to recent fall   Lavinia Sharps, PT 06/01/23 9:29 PM Phone: [phone] Fax: [phone]

## 2023-06-01 NOTE — Progress Notes (Unsigned)
[facility] [address] [place] [address] Phone: [phone] Subjective:   Jean Smith, am serving as a scribe for Dr. Antoine Primas.  I'm seeing this patient by the request  of:  Myrlene Broker, MD  CC: Knee pain follow-up  HKV:[id number]  03/31/2023 Patient has talked to an endocrinologist before.  Would like to see if she could return for this.  Patient will continue to work on increasing activity.   Patient does have some instability with valgus and varus force.  Seems to be more arthritic changes noted bilateral OA stability brace helpful.  Does have abnormal thigh to calf ratio, presumably for a.  Discussed with patient antibiotics potential other things such as icing regimen and home exercises.  Discussed which activities to do and which ones to avoid.  Increase activity slowly.  Follow-up with me again in 6 to 8 weeks otherwise.  Worsening pain will consider the injections.  Will start with physical therapy today secondary to some of the discomfort as well.     Updated 06/02/2023 Jean Smith is a 72 y.o. female coming in with complaint of knee pain, found to have arthritic changes.  Was to get a custom brace.  Patient states fell since last visit on bad knee. Brought her brace.       Past Medical History:  Diagnosis Date   Allergy    Anxiety    Aortic regurgitation    moderate by echo 10/2020   Arthritis    Cancer (HCC)    skin   Cataract    bilateral - MD is just watching    Diverticulosis    Fuchs' endothelial dystrophy    follows with optho regularly    Gestational diabetes    Heart murmur    MVP    History of depression    HSV-2 infection    Hyperlipidemia    Hyperplastic colon polyp    Mitral valve prolapse    mild to moderate MR by echo 10/2020   PVC's (premature ventricular contractions)    intol of BB, sxc palpitations r/t stress   RVOT ventricular tachycardia/PVCs    EP eval 01/2013 for freq PVCs    Past Surgical History:  Procedure Laterality Date   CESAREAN SECTION     x 3   COLONOSCOPY     KNEE SURGERY Right    MANDIBLE SURGERY     right side in front of ear   MOHS SURGERY  2021   POLYPECTOMY     WISDOM TOOTH EXTRACTION     Social History   Socioeconomic History   Marital status: Married    Spouse name: Not on file   Number of children: 3   Years of education: Not on file   Highest education level: Some college, no degree  Occupational History   Not on file  Tobacco Use   Smoking status: Former    Current packs/day: 0.00    Types: Cigarettes    Quit date: 06/15/1983    Years since quitting: 39.9   Smokeless tobacco: Never  Vaping Use   Vaping status: Never Used  Substance and Sexual Activity   Alcohol use: Not Currently    Comment: occasional   Drug use: No   Sexual activity: Not Currently    Birth control/protection: Post-menopausal  Other Topics Concern   Not on file  Social History Narrative   Regular exercise: yes   Caffeine use: none   Social Drivers  of Health   Financial Resource Strain: Low Risk  (03/23/2023)   Overall Financial Resource Strain (CARDIA)    Difficulty of Paying Living Expenses: Not hard at all  Food Insecurity: No Food Insecurity (03/23/2023)   Hunger Vital Sign    Worried About Running Out of Food in the Last Year: Never true    Ran Out of Food in the Last Year: Never true  Transportation Needs: No Transportation Needs (03/23/2023)   PRAPARE - Administrator, Civil Service (Medical): No    Lack of Transportation (Non-Medical): No  Physical Activity: Unknown (03/23/2023)   Exercise Vital Sign    Days of Exercise per Week: Patient declined    Minutes of Exercise per Session: Not on file  Stress: Stress Concern Present (03/23/2023)   [employer] of Occupational Health - Occupational Stress Questionnaire    Feeling of Stress : To some extent  Social Connections: Socially Integrated (03/23/2023)   Social  Connection and Isolation Panel [NHANES]    Frequency of Communication with Friends and Family: Three times a week    Frequency of Social Gatherings with Friends and Family: Patient declined    Attends Religious Services: 1 to 4 times per year    Active Member of [employer] or Organizations: Yes    Attends Banker Meetings: 1 to 4 times per year    Marital Status: Married   Allergies  Allergen Reactions   Latex Swelling   Other     Grass, local trees, cats and dogs   Pollen Extract    Family History  Problem Relation Age of Onset   Colon cancer Mother        dx'd in her 36's-- stage 2    Heart disease Father    Colon polyps Sister    Colon polyps Sister    Colon cancer Maternal Grandmother    Colon polyps Daughter    Alcohol abuse Other    Rectal cancer Neg Hx    Stomach cancer Neg Hx    Esophageal cancer Neg Hx      Current Outpatient Medications (Cardiovascular):    propranolol (INDERAL) 10 MG tablet, Take 10 mg by mouth as needed (increased HR and skipped beats).   rosuvastatin (CRESTOR) 5 MG tablet, Take 1 tablet (5 mg total) by mouth daily.  Current Outpatient Medications (Respiratory):    loratadine (CLARITIN) 10 MG tablet, Take 10 mg by mouth daily as needed for allergies.    Current Outpatient Medications (Other):    ALPRAZolam (XANAX) 0.25 MG tablet, TAKE 1 TABLET BY MOUTH DAILY AS NEEDED FOR ANXIETY   ascorbic acid (VITAMIN C) 500 MG tablet, Take 500 mg by mouth every 3 (three) days. [phone] MG   b complex vitamins capsule, Take 1 capsule by mouth every 3 (three) days.   Cholecalciferol (VITAMIN D-3) 125 MCG (5000 UT) TABS, Take 1 tablet by mouth every 3 (three) days.   MAGNESIUM GLUCONATE PO, Take 400 mg by mouth daily.   Turmeric (QC TUMERIC COMPLEX PO), Take by mouth every 3 (three) days.   Reviewed prior external information including notes and imaging from  primary care provider As well as notes that were available from care everywhere and  other healthcare systems.  Past medical history, social, surgical and family history all reviewed in electronic medical record.  No pertanent information unless stated regarding to the chief complaint.   Review of Systems:  No headache, visual changes, nausea, vomiting, diarrhea, constipation, dizziness, abdominal pain, skin  rash, fevers, chills, night sweats, weight loss, swollen lymph nodes, body aches, joint swelling, chest pain, shortness of breath, mood changes. POSITIVE muscle aches  Objective  Blood pressure 130/62, pulse (!) 110, height 5\' 4"  (1.626 m), weight 173 lb (78.5 kg), SpO2 96%.   General: No apparent distress alert and oriented x3 mood and affect normal, dressed appropriately.  HEENT: Pupils equal, extraocular movements intact  Respiratory: Patient's speak in full sentences and does not appear short of breath  Cardiovascular: No lower extremity edema, non tender, no erythema  Knee exam shows arthritic changes noted.  Does have a bruising noted on the anterior tibialis area.  Needs to be evaluated significant arthritic changes with some instability noted    Impression and Recommendations:     The above documentation has been reviewed and is accurate and complete Judi Saa, DO

## 2023-06-02 ENCOUNTER — Ambulatory Visit: Payer: PPO | Admitting: Family Medicine

## 2023-06-02 ENCOUNTER — Encounter: Payer: Self-pay | Admitting: Family Medicine

## 2023-06-02 VITALS — BP 130/62 | HR 110 | Ht 64.0 in | Wt 173.0 lb

## 2023-06-02 DIAGNOSIS — M1711 Unilateral primary osteoarthritis, right knee: Secondary | ICD-10-CM

## 2023-06-02 DIAGNOSIS — G8929 Other chronic pain: Secondary | ICD-10-CM

## 2023-06-02 DIAGNOSIS — M25561 Pain in right knee: Secondary | ICD-10-CM

## 2023-06-02 NOTE — Patient Instructions (Signed)
[employer] rep will reach out to you See you again in 2-3 months

## 2023-06-02 NOTE — Assessment & Plan Note (Addendum)
Does have degenerative arthritic changes noted of this right knee.  Patient did bring in her new custom OA stability brace but has not been sized yet by vendor.  Discussed that she does need to contact them at the moment.  Will redo physical therapy referral to allow her daughter to continue with patient having fall recently and the bruising.  Patient should do well with this for another 6 to 12 weeks. Follow-up with me at the end of that to further evaluate

## 2023-06-09 ENCOUNTER — Ambulatory Visit: Payer: Self-pay | Admitting: Physical Therapy

## 2023-06-09 ENCOUNTER — Ambulatory Visit: Payer: PPO | Admitting: Internal Medicine

## 2023-06-09 ENCOUNTER — Encounter: Payer: Self-pay | Admitting: Internal Medicine

## 2023-06-09 VITALS — BP 138/72 | HR 111 | Ht 64.0 in | Wt 171.8 lb

## 2023-06-09 DIAGNOSIS — E785 Hyperlipidemia, unspecified: Secondary | ICD-10-CM | POA: Diagnosis not present

## 2023-06-09 DIAGNOSIS — E1169 Type 2 diabetes mellitus with other specified complication: Secondary | ICD-10-CM | POA: Diagnosis not present

## 2023-06-09 DIAGNOSIS — M25661 Stiffness of right knee, not elsewhere classified: Secondary | ICD-10-CM

## 2023-06-09 DIAGNOSIS — M6281 Muscle weakness (generalized): Secondary | ICD-10-CM

## 2023-06-09 DIAGNOSIS — E1165 Type 2 diabetes mellitus with hyperglycemia: Secondary | ICD-10-CM

## 2023-06-09 DIAGNOSIS — M25561 Pain in right knee: Secondary | ICD-10-CM | POA: Diagnosis not present

## 2023-06-09 DIAGNOSIS — G8929 Other chronic pain: Secondary | ICD-10-CM

## 2023-06-09 DIAGNOSIS — R262 Difficulty in walking, not elsewhere classified: Secondary | ICD-10-CM

## 2023-06-09 LAB — POCT GLYCOSYLATED HEMOGLOBIN (HGB A1C): Hemoglobin A1C: 6.4 % — AB (ref 4.0–5.6)

## 2023-06-09 MED ORDER — FREESTYLE LIBRE 3 READER DEVI: 1.0000 | Freq: Once | 0 refills | Status: AC

## 2023-06-09 MED ORDER — FREESTYLE LIBRE 3 PLUS SENSOR MISC: 1.0000 | 3 refills | Status: DC

## 2023-06-09 NOTE — Therapy (Signed)
OUTPATIENT PHYSICAL THERAPY TREATMENT NOTE  Patient Name: Jean Smith MRN: [id number] DOB:18-May-1951, 72 y.o., female Today's Date: 06/09/2023    END OF SESSION:  PT End of Session - 06/09/23 1153     Visit Number 17    Date for PT Re-Evaluation 07/27/23    Progress Note Due on Visit 20    PT Start Time 1150    PT Stop Time 1230    PT Time Calculation (min) 40 min    Activity Tolerance Patient tolerated treatment well             Past Medical History:  Diagnosis Date   Allergy    Anxiety    Aortic regurgitation    moderate by echo 10/2020   Arthritis    Cancer (HCC)    skin   Cataract    bilateral - MD is just watching    Diverticulosis    Fuchs' endothelial dystrophy    follows with optho regularly    Gestational diabetes    Heart murmur    MVP    History of depression    HSV-2 infection    Hyperlipidemia    Hyperplastic colon polyp    Mitral valve prolapse    mild to moderate MR by echo 10/2020   PVC's (premature ventricular contractions)    intol of BB, sxc palpitations r/t stress   RVOT ventricular tachycardia/PVCs    EP eval 01/2013 for freq PVCs   Past Surgical History:  Procedure Laterality Date   CESAREAN SECTION     x 3   COLONOSCOPY     KNEE SURGERY Right    MANDIBLE SURGERY     right side in front of ear   MOHS SURGERY  2021   POLYPECTOMY     WISDOM TOOTH EXTRACTION     Patient Active Problem List   Diagnosis Date Noted   AC (acromioclavicular) arthritis 05/28/2022   Mitral valve prolapse    Aortic regurgitation    NSVT (nonsustained ventricular tachycardia) (HCC) 09/25/2020   Diabetes mellitus type 2 with complications (HCC) 08/27/2020   Chronic pain of both shoulders 05/05/2018   Other fatigue 01/02/2018   Allergic rhinitis 01/02/2018   Routine general medical examination at a health care facility 09/01/2015   Hyperlipidemia associated with type 2 diabetes mellitus (HCC) 09/01/2015   Varicose vein 08/27/2014   Primary  localized osteoarthrosis, lower leg 10/30/2013   RVOT ventricular tachycardia/PVCs 01/31/2013   Abnormal stress echo 12/11/2012    PCP: Myrlene Broker, MD  REFERRING PROVIDER: Judi Saa, DO  REFERRING DIAG: [phone] (ICD-10-CM) - Chronic pain of right knee  THERAPY DIAG:  Chronic pain of right knee  Stiffness of right knee, not elsewhere classified  Muscle weakness (generalized)  Difficulty in walking, not elsewhere classified  Rationale for Evaluation and Treatment: Rehabilitation  ONSET DATE: 03/31/2023  SUBJECTIVE:   SUBJECTIVE STATEMENT: My dishwasher flooded.  I'm glad my family was home, there's no way I could have cleaned up all that water.    PERTINENT HISTORY: Knee scope approx 15 years ago (right knee) PAIN:   Are you having pain? Yes: NPRS scale: 4/10 Pain location: bil knees right > left Pain description: aching, sharp Aggravating factors: walking, standing, bending,stooping, squatting Relieving factors: rest, meds  PRECAUTIONS: Fall  RED FLAGS: None   WEIGHT BEARING RESTRICTIONS: No  FALLS:  Has patient fallen in last 6 months? No  LIVING ENVIRONMENT: Lives with: lives with their spouse Lives in: House/apartment Stairs: Yes: Internal:  12 steps; on right going up and External: 5 steps; on right going up Has following equipment at home: None  OCCUPATION: retired  PLOF: Independent, Independent with basic ADLs, Independent with household mobility without device, Independent with community mobility without device, Independent with homemaking with ambulation, Independent with gait, and Independent with transfers  PATIENT GOALS: to be able to walk and get up and down from a chair and do her routine daily activities without pain and without her knee giving way  NEXT MD VISIT: prn  OBJECTIVE:  Note: Objective measures were completed at Evaluation unless otherwise noted.  DIAGNOSTIC FINDINGS:  05/30/22: RIGHT KNEE 3 VIEWS   COMPARISON:  Bilateral knee radiographs May 19th, 2015  FINDINGS: No acute fracture or dislocation. No joint effusion. Mild lateral compartment subchondral sclerosis and cystic changes with mild osteophytosis. No soft tissue abnormalities.  IMPRESSION: Mild lateral compartment osteoarthritis.  07/28/22 Left knee: Resulted by: Judi Saa, DO Performed: 07/28/22 1411 - 07/28/22 1411  Accession number: [id number] Resulting lab: [facility]  Narrative: Limited muscular skeletal ultrasound was performed and interpreted by Antoine Primas, M Limited ultrasound continues to show the patient does have some hypoechoic changes in the patellofemoral joint noted.  Some degenerative changes of the meniscus noted. Impression: Improvement in inflammation but still continue effusion    PATIENT SURVEYS:  Eval: FOTO 38, predicted 55 05/09/2023:  FOTO 49 12/18:  54  COGNITION: Overall cognitive status: Within functional limits for tasks assessed     SENSATION: WFL   POSTURE:  slight knee valgus bilaterally  PALPATION: Mod crepitus noted on seated open chain flexion/extension  LOWER EXTREMITY ROM:  WNL LOWER EXTREMITY MMT:  Generally 4/5 with exception of bilateral knee flexion 4-/5 and bilateral hip abd 4-/5 and bilateral hip ER 3+/5  LOWER EXTREMITY SPECIAL TESTS:  Knee special tests: Patellafemoral grind test: positive   FUNCTIONAL TESTS:  Eval: 5 times sit to stand: 24 sec Timed up and go (TUG): 14.5 sec  05/03/2023: 5 times sit to stand:  18.36 sec without UE use Timed up and go (TUG): 8.00 sec 3 minute walk test: 612 ft without increased pain with RPE of 3/10  12/18 5x 20.16 (painful secondary to recent fall and prolonged standing yesterday) 6 MWT 1140 3/10 knee pain, back pain  3 MWT 550 feet  GAIT: Distance walked: 50 feet Assistive device utilized: None Level of assistance: Complete Independence Comments: antalgic, slow, right LE externally  rotated, guarded, short step length   TODAY'S TREATMENT:    DATE: 06/09/2023 Recumbent bike level 1 10 min with PT present to discuss status Leg Press (seat at 7) 75# 2x10 bil Single Leg Press 35# 15x right/left Standing bil calf raises 10x Seated HS green band high on leg for comfort and slider 15x right/left Side stepping with green band above thighs 5 laps Attempted Spanish squat style with purple heavy band behind both knees with isometric hold 3x 10 sec hold (painful on side of knee) Isometric holds in partial squat (worked better, more comfortable) 3x 10 sec holds   DATE: 05/26/2023 Recumbent bike level 1 x 10 min with PT present to discuss status FOTO Goal setting 6 MWT 5x STS Green band HS curls (band placed high on leg for comfort) 15x right/left Leg Press (seat at 7)  70# 20x 2 rounds  DATE: 05/30/2023 Recumbent bike level 2 6 min with PT present to discuss status Seated hamstring stretch 2x20 sec bilat Seated with 4# ankle weight:  LAQ and marching.  2x10 each bilat Standing high marching 15 ft down hallway and back with 4# ankle weights Side stepping with 4# ankle weights 2x10 ft bilaterally Seated hamstring curl with green tband 2x10 Box stepping with 4 dots on floor x5 each direction Leg Press (seat at 7) 80# 2x10 Single Leg Press 40# x10 left LE, 30# x10 right LE   DATE: 05/26/2023 Recumbent bike level 1 x 8 min with PT present to discuss status Mini trampoline:weight shifting side to side, front to back, marching, light bounce 1 min each Medium pink TKEs bil 20x Seated HS curl red power cord and slider 20x right/left Spanish squat style/sit to stand  with medium power cord behind knees 3x (painful so discontinued) Seated single leg press into red ball isometric 5 sec holds 10x right/left Leg Press (seat at 7)  75# 15x Single Leg Press (seat 7) 35# 15x right/left (turns toe in for comfort)  DATE: 05/24/2023 Recumbent bike level 1 x 8 min with PT present  to discuss status Seated with 4# ankle weight:  LAQ  2x10 each bilat Seated with 4# ankle weight: sitting on purple cushion marching 10x right/left Seated no weight hip flexion up and over target on floor 10x Seated hamstring curls with green band (high on leg b/c of varicose veins) 15x bilat Lateral band walks with green band above knees with cue not to let legs touch 8 laps at the railing 4 inch step ups (long step)  2x 10 right/left  (single arm railing assist) Leg Press (seat at 7)  75# 2 x 10 (2nd set with heels lifted) Single Leg Press (seat 7) 35# 10x right/left  Standing bil calf raises 10x    PATIENT EDUCATION:  Education details: Initiated HEP Person educated: Patient Education method: Programmer, multimedia, Facilities manager, Verbal cues, and Handouts Education comprehension: verbalized understanding, returned demonstration, and verbal cues required  HOME EXERCISE PROGRAM: Access Code: [email or web address] URL: [email or web address] Date: 04/12/2023 Prepared by: Lavinia Sharps  Exercises - Long Sitting Quad Set  - 1 x daily - 7 x weekly - 3 sets - 10 reps - Supine Knee Extension Strengthening  - 1 x daily - 7 x weekly - 3 sets - 10 reps - Seated Long Arc Quad  - 1 x daily - 7 x weekly - 3 sets - 10 reps - Straight Leg Raise  - 1 x daily - 7 x weekly - 1 sets - 10 reps - Clamshell  - 1 x daily - 7 x weekly - 1 sets - 10 reps - Seated Hamstring Curl with Anchored Resistance  - 1 x daily - 7 x weekly - 1 sets - 10 reps - Standing Heel Raise with Support  - 1 x daily - 7 x weekly - 1 sets - 10 reps  ASSESSMENT:  CLINICAL IMPRESSION: Some modifications for pain including band placement and UE assistance to achieve partial squats isometrics.   Overall she is close to status prior to fall. We continue to target quads, HS, gluteals and gastrocs for knee joint support.  She continues to be load sensitive but able to tolerate moderate weight on the leg press.  Will resume 2 or 4 inch step  ups next visit.   OBJECTIVE IMPAIRMENTS: Abnormal gait, decreased balance, decreased knowledge of use of DME, decreased mobility, difficulty walking, decreased strength, increased edema, increased fascial restrictions, increased muscle spasms, impaired flexibility, postural dysfunction, obesity, and pain.   ACTIVITY LIMITATIONS: lifting, bending, sitting, standing, squatting, sleeping, stairs, transfers, bed mobility, bathing, toileting, dressing,  and caring for others  PARTICIPATION LIMITATIONS: meal prep, cleaning, laundry, driving, shopping, community activity, yard work, and church  PERSONAL FACTORS: Fitness, Time since onset of injury/illness/exacerbation, and 1-2 comorbidities: anxiety and obesity  are also affecting patient's functional outcome.   REHAB POTENTIAL: Fair due to possible mechanical meniscus injury or joint deterioration  CLINICAL DECISION MAKING: Evolving/moderate complexity  EVALUATION COMPLEXITY: Moderate   GOALS: Goals reviewed with patient? Yes  SHORT TERM GOALS: Target date: 05/04/2023   Patient will be independent with initial HEP  Baseline: Goal status: MET on 05/03/2023  2.  Pain report to be no greater than 4/10  Baseline:  Goal status: Ongoing   LONG TERM GOALS: Target date: 07/27/2023   Patient to be independent with advanced HEP  Baseline:  Goal status: Ongoing  2.  Patient to report pain no greater than 2/10  Baseline:  Goal status: INITIAL  3.  Patient to be able to bend, stoop and squat with pain no greater than 2/10  Baseline:  Goal status: INITIAL  4.  Patient to be able to ascend and descend steps without pain or no greater than 2/10  Baseline:  Goal status: Ongoing  5.  Patient to be able to sleep through the night  Baseline:  Goal status: Ongoing  6.  Patient to report 85% improvement in overall symptoms  Baseline:  Goal status: Ongoing 7.  Six minute walk test 1200 feet indicating improved gait speed and strength.   new  8. Able to rise sit to stand from a standard chair with minimal knee pain (5x STS <17 sec) new   PLAN:  PT FREQUENCY: 1-2x/week  PT DURATION: 8 weeks  PLANNED INTERVENTIONS: 97110-Therapeutic exercises, 97530- Therapeutic activity, [id number]- Neuromuscular re-education, 97535- Self Care, 40981- Manual therapy, [id number]- Gait training, [phone]- Aquatic Therapy, [phone]- Splinting, 97014- Electrical stimulation (unattended), [id number]- Electrical stimulation (manual), 97016- Vasopneumatic device, [id number]- Ultrasound, [id number]- Ionotophoresis 4mg /ml Dexamethasone, Patient/Family education, Balance training, Stair training, Taping, Dry Needling, Joint mobilization, Scar mobilization, Compression bandaging, Vestibular training, Visual/preceptual remediation/compensation, Cryotherapy, and Moist heat  PLAN FOR NEXT SESSION:  2 or 4 inch step ups;  leg press; quad, HS, glute strengthening in unloaded positions and partial load; may need some modification secondary to recent fall  Lavinia Sharps, PT 06/09/23 3:16 PM Phone: [phone] Fax: [phone]

## 2023-06-09 NOTE — Patient Instructions (Addendum)
Try to get the Freestyle Libre 3 CGM.  Please come back in 3 months.  PATIENT INSTRUCTIONS FOR TYPE 2 DIABETES:  DIET AND EXERCISE Diet and exercise is an important part of diabetic treatment.  We recommended aerobic exercise in the form of brisk walking (working between 40-60% of maximal aerobic capacity, similar to brisk walking) for 150 minutes per week (such as 30 minutes five days per week) along with 3 times per week performing 'resistance' training (using various gauge rubber tubes with handles) 5-10 exercises involving the major muscle groups (upper body, lower body and core) performing 10-15 repetitions (or near fatigue) each exercise. Start at half the above goal but build slowly to reach the above goals. If limited by weight, joint pain, or disability, we recommend daily walking in a swimming pool with water up to waist to reduce pressure from joints while allow for adequate exercise.    BLOOD GLUCOSES Monitoring your blood glucoses is important for continued management of your diabetes. Please check your blood glucoses 2-4 times a day: fasting, before meals and at bedtime (you can rotate these measurements - e.g. one day check before the 3 meals, the next day check before 2 of the meals and before bedtime, etc.).   HYPOGLYCEMIA (low blood sugar) Hypoglycemia is usually a reaction to not eating, exercising, or taking too much insulin/ other diabetes drugs.  Symptoms include tremors, sweating, hunger, confusion, headache, etc. Treat IMMEDIATELY with 15 grams of Carbs: 4 glucose tablets  cup regular juice/soda 2 tablespoons raisins 4 teaspoons sugar 1 tablespoon honey Recheck blood glucose in 15 mins and repeat above if still symptomatic/blood glucose <100.  RECOMMENDATIONS TO REDUCE YOUR RISK OF DIABETIC COMPLICATIONS: * Take your prescribed MEDICATION(S) * Follow a DIABETIC diet: Complex carbs, fiber rich foods, (monounsaturated and polyunsaturated) fats * AVOID saturated/trans  fats, high fat foods, >2,300 mg salt per day. * EXERCISE at least 5 times a week for 30 minutes or preferably daily.  * DO NOT SMOKE OR DRINK more than 1 drink a day. * Check your FEET every day. Do not wear tightfitting shoes. Contact us if you develop an ulcer * See your EYE doctor once a year or more if needed * Get a FLU shot once a year * Get a PNEUMONIA vaccine once before and once after age 15 years  GOALS:  * Your Hemoglobin A1c of <7%  * fasting sugars need to be 80-130 * after meals sugars need to be <180 (2h after you start eating) * Your Systolic BP should be 130 or lower  * Your Diastolic BP should be 80 or lower  * Your HDL (Good Cholesterol) should be 40 or higher  * Your LDL (Bad Cholesterol) should be ideally <70. * Your Triglycerides should be 150 or lower  * Your Urine microalbumin (kidney function) should be <30 * Your Body Mass Index should be 25 or lower   Please consider the following ways to cut down carbs and fat and increase fiber and micronutrients in your diet: - substitute whole grain for white bread or pasta - substitute brown rice for white rice - substitute 90-calorie flat bread pieces for slices of bread when possible - substitute sweet potatoes or yams for white potatoes - substitute humus for margarine - substitute tofu for cheese when possible - substitute almond or rice milk for regular milk (would not drink soy milk daily due to concern for soy estrogen influence on breast cancer risk) - substitute dark chocolate for other  sweets when possible - substitute water - can add lemon or orange slices for taste - for diet sodas (artificial sweeteners will trick your body that you can eat sweets without getting calories and will lead you to overeating and weight gain in the long run) - do not skip breakfast or other meals (this will slow down the metabolism and will result in more weight gain over time)  - can try smoothies made from fruit and almond/rice  milk in am instead of regular breakfast - can also try old-fashioned (not instant) oatmeal made with almond/rice milk in am - order the dressing on the side when eating salad at a restaurant (pour less than half of the dressing on the salad) - eat as little meat as possible - can try juicing, but should not forget that juicing will get rid of the fiber, so would alternate with eating raw veg./fruits or drinking smoothies - use as little oil as possible, even when using olive oil - can dress a salad with a mix of balsamic vinegar and lemon juice, for e.g. - use agave nectar, stevia sugar, or regular sugar rather than artificial sweateners - steam or broil/roast veggies  - snack on veggies/fruit/nuts (unsalted, preferably) when possible, rather than processed foods - reduce or eliminate aspartame in diet (it is in diet sodas, chewing gum, etc) Read the labels!  Try to read Dr. Katherina Right book: "Program for Reversing Diabetes" for other ideas for healthy eating.

## 2023-06-09 NOTE — Progress Notes (Signed)
Patient ID: Jean Smith, female   DOB: 1951/06/13, 72 y.o.   MRN: [id number]  HPI: Jean Smith is a 72-year-old 72 y.o.-year-old female, referred by Dr. Ayesha Mohair, for management of newly diagnosed DM2, dx in 03/2023, non-insulin-dependent, without long-term complications.  I previously saw the patient 10 years ago for fatigue, palpitations, and dizziness with essentially negative workup.  Patient has a history of GDM, then developed prediabetes.  She was diagnosed with diabetes 2 months ago.  She mentions that this diagnosis came after several years of significant stress.  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  Reviewed HbA1c: Lab Results  Component Value Date   HGBA1C 6.5 03/24/2023   HGBA1C 6.5 11/09/2022   HGBA1C 6.4 11/05/2021   HGBA1C 6.4 (A) 02/25/2021   HGBA1C 6.4 08/25/2020   HGBA1C 6.3 05/20/2020   HGBA1C 6.4 01/02/2018   HGBA1C 6.3 09/01/2015   HGBA1C 5.9 01/23/2013   HGBA1C 5.9 10/16/2012   Pt is not on any medication for her diabetes.  Pt. Is not checking blood sugars.  Glucometer: none  Pt's meals are: - Breakfast: 2 eggs or oats with blueberries - Lunch: Soup or vegetables or [place] - Dinner: Spaghetti with chicken or salmon, salad - Snacks: 2-3: Apple, almond butter, chocolate She is exercising - walking, Pilates, strength training, stretching, and yoga 1-2 times a week. Has a a stationary bike at home but he she is not using it right now.  - no CKD, last BUN/creatinine:  Lab Results  Component Value Date   BUN 17 11/09/2022   BUN 17 11/05/2021   CREATININE 0.80 11/09/2022   CREATININE 0.71 11/05/2021  No components found for: "MICRALCREAT"  -+ HL; last set of lipids: Lab Results  Component Value Date   CHOL 201 (H) 11/09/2022   HDL 80.00 11/09/2022   LDLCALC 103 (H) 11/09/2022   LDLDIRECT 168.0 01/02/2018   TRIG 92.0 11/09/2022   CHOLHDL 3 11/09/2022   He is not on a statin.  - last eye exam was 06/2022: No DR. Elmer Picker)  - no numbness and tingling in her feet. She sees ortho.  Last foot exam 03/24/2023.  Pt has FH of DM in uncle, aunt - alcoholism.  I reviewed my previous note from 2014 when she presented with fatigue, palpitations, and dizziness: Patient mentioned that she had anxiety (GAD) but felt well up until Spring of 2014 when she developed dizziness that started during her daily walks in 11/2012 >> saw Dr Elease Hashimoto >> NSVT on Holter monitor >> started beta blocker (Propranolol q 6 h) >> nausea, SOB, numbness, dysphagia >> had to go to the ED >> thought to be 2/2 anxiety (is on benzodiazepines) >> event monitor in 01/2013 >> ED again: PVCs (despite Xanax and Propranolol). She decided to see endocrinology.  At the time of the 2014 visit, she c/o: - + weight gain > 10 lbs - + flushing >> 5HIAA urinary - normal, cathecolamines reportedly normal - gets red in the face and on neck - ? If they are Hot flushes as she never had them when she actually went through menopause 5 years prior - + increased pulse, decreased BP - only on prn Propranolol - + anxiety - Xanax - + dizziness - especially with raising hands and washing hair, for e.g. Or when exercising with elastic bands at [employer] - + palpitations - + lightheaded  before meals and ~1.5 hours post meals, but mostly after Propranolol - + spider veins on legs have increased - + bloated and occasionally RLQ pain - she feels unwell, cannot describe better, thinks she may be depressed, too.   At that time, her HbA1c was 5.9%, slightly high x2 giving her a diagnosis of prediabetes.  If her meter at that time to check blood sugars whenever feeling dizzy to check for reactive hypoglycemia. ESR was very slightly elevated. Thyroid tests, CBC, CMP, 5 HIAA (for carcinoid), celiac blood workup, cortisol level were all normal. She saw cardiology: ruled out for POTS, and she had normal 2-D echo  and stress test. She did have a 24 HR monitor that showed, PVCs and several runs of NSVT. She also has a history of MVP.  Patient also has a history of aortic regurgitation, osteopenia, GERD, skin cancer  ROS: Constitutional: + weight gain, no weight loss, + fatigue, no subjective hyperthermia, no subjective hypothermia,, + excessive urination and  nocturia Eyes: + Occasional blurry vision, no xerophthalmia ENT: no sore throat, no nodules palpated in neck, no dysphagia, no odynophagia, no hoarseness, + tinnitus, no hypoacusis Cardiovascular: no CP, no SOB, + palpitations, no leg swelling Respiratory: no cough, no SOB, no wheezing Gastrointestinal: no N, no V, no D, no C, no acid reflux Musculoskeletal: no muscle, no joint aches, + bone pain Skin: no rash, no hair loss, + easy bruising Neurological: no tremors, no numbness or tingling/no dizziness/no HAs Psychiatric: + Anxiety and depression  Past Medical History:  Diagnosis Date   Allergy    Anxiety    Aortic regurgitation    moderate by echo 10/2020   Arthritis    Cancer (HCC)    skin   Cataract    bilateral - MD is just watching    Diverticulosis    Fuchs' endothelial dystrophy    follows with optho regularly    Gestational diabetes    Heart murmur    MVP    History of depression    HSV-2 infection    Hyperlipidemia    Hyperplastic colon polyp    Mitral valve prolapse    mild to moderate MR by echo 10/2020   PVC's (premature ventricular contractions)    intol of BB, sxc palpitations r/t stress   RVOT ventricular tachycardia/PVCs    EP eval 01/2013 for freq PVCs   Past Surgical History:  Procedure Laterality Date   CESAREAN SECTION     x 3   COLONOSCOPY     KNEE SURGERY Right    MANDIBLE SURGERY     right side in front of ear   MOHS SURGERY  2021   POLYPECTOMY     WISDOM TOOTH EXTRACTION     Social History   Socioeconomic History   Marital status: Married    Spouse name: Not on file   Number of children: 3    Years of education: Not on file   Highest education level: Some college, no degree  Occupational History   Not on file  Tobacco Use   Smoking status: Former    Current packs/day: 0.00    Types: Cigarettes    Quit date: 06/15/1983    Years since quitting: 40.0   Smokeless tobacco: Never  Vaping Use   Vaping status: Never Used  Substance and Sexual Activity   Alcohol use: Not Currently    Comment: occasional   Drug use: No   Sexual activity: Not Currently    Birth control/protection:  Post-menopausal  Other Topics Concern   Not on file  Social History Narrative   Regular exercise: yes   Caffeine use: none   Social Drivers of Corporate investment banker Strain: Low Risk  (03/23/2023)   Overall Financial Resource Strain (CARDIA)    Difficulty of Paying Living Expenses: Not hard at all  Food Insecurity: No Food Insecurity (03/23/2023)   Hunger Vital Sign    Worried About Running Out of Food in the Last Year: Never true    Ran Out of Food in the Last Year: Never true  Transportation Needs: No Transportation Needs (03/23/2023)   PRAPARE - Administrator, Civil Service (Medical): No    Lack of Transportation (Non-Medical): No  Physical Activity: Unknown (03/23/2023)   Exercise Vital Sign    Days of Exercise per Week: Patient declined    Minutes of Exercise per Session: Not on file  Stress: Stress Concern Present (03/23/2023)   [employer] of Occupational Health - Occupational Stress Questionnaire    Feeling of Stress : To some extent  Social Connections: Socially Integrated (03/23/2023)   Social Connection and Isolation Panel [NHANES]    Frequency of Communication with Friends and Family: Three times a week    Frequency of Social Gatherings with Friends and Family: Patient declined    Attends Religious Services: 1 to 4 times per year    Active Member of [employer] or Organizations: Yes    Attends Banker Meetings: 1 to 4 times per year    Marital Status:  Married  Catering manager Violence: Not At Risk (06/16/2021)   Humiliation, Afraid, Rape, and Kick questionnaire    Fear of Current or Ex-Partner: No    Emotionally Abused: No    Physically Abused: No    Sexually Abused: No   Current Outpatient Medications on File Prior to Visit  Medication Sig Dispense Refill   ALPRAZolam (XANAX) 0.25 MG tablet TAKE 1 TABLET BY MOUTH DAILY AS NEEDED FOR ANXIETY 20 tablet 0   Cholecalciferol (VITAMIN D-3) 125 MCG (5000 UT) TABS Take 1 tablet by mouth every 3 (three) days.     loratadine (CLARITIN) 10 MG tablet Take 10 mg by mouth daily as needed for allergies.     MAGNESIUM GLUCONATE PO Take 400 mg by mouth daily.     propranolol (INDERAL) 10 MG tablet Take 10 mg by mouth as needed (increased HR and skipped beats).     rosuvastatin (CRESTOR) 5 MG tablet Take 1 tablet (5 mg total) by mouth daily. 90 tablet 3   Turmeric (QC TUMERIC COMPLEX PO) Take by mouth every 3 (three) days.     ascorbic acid (VITAMIN C) 500 MG tablet Take 500 mg by mouth every 3 (three) days. [phone] MG (Patient not taking: Reported on 06/09/2023)     b complex vitamins capsule Take 1 capsule by mouth every 3 (three) days. (Patient not taking: Reported on 06/09/2023)     No current facility-administered medications on file prior to visit.   Allergies  Allergen Reactions   Latex Swelling   Other     Grass, local trees, cats and dogs   Pollen Extract    Family History  Problem Relation Age of Onset   Colon cancer Mother        dx'd in her 35's-- stage 2    Hypertension Father    Heart disease Father    Hyperlipidemia Father    Thyroid disease Sister    Colon polyps  Sister    Colon polyps Sister    Colon cancer Maternal Grandmother    Colon polyps Daughter    Alcohol abuse Other    Rectal cancer Neg Hx    Stomach cancer Neg Hx    Esophageal cancer Neg Hx    PE: BP 138/72   Pulse (!) 111   Ht 5\' 4"  (1.626 m)   Wt 171 lb 12.8 oz (77.9 kg)   SpO2 94%   BMI 29.49 kg/m   Wt Readings from Last 3 Encounters:  06/09/23 171 lb 12.8 oz (77.9 kg)  06/02/23 173 lb (78.5 kg)  03/31/23 175 lb (79.4 kg)   Constitutional: overweight, in NAD Eyes:  EOMI, no exophthalmos ENT: no neck masses, no cervical lymphadenopathy Cardiovascular: RRR, No MRG Respiratory: CTA B Musculoskeletal: no deformities Skin:no rashes Neurological: no tremor with outstretched hands  ASSESSMENT: 1. DM2, non-insulin-dependent, newly diagnosed type 2 diabetes  2. HL  PLAN:  1. Patient with diagnosis of diabetes, diet controlled.  She previously had prediabetes for at least 10 years.  In the last 3 years she has been stressed after her son had a fall and developed traumatic brain injury.  She was taking care of him for a long time and was not able to be very active.  She started to eat more and gained weight.  She had an HbA1c in the diabetic range in 10/2022 and a repeat was still 6.5% in 03/2023.  At today's visit, HbA1c is slightly lower at 6.4%. -At today's visit we discussed about the fact that her HbA1c values are borderline between prediabetes and diabetes, and at this point, her diabetes may still be reversible.  This requires increased attention to diet and exercise.  She is exercising and I recommended to add some more cardio-she has a stationary bike and plans to use it.  She would be very much be interested in CGM and we discussed that this may not be covered by her insurance but I would highly recommended.  She would like to try to pay out-of-pocket if this is not covered.  We discussed how this can influence her behavior and therefore her blood sugars.  I would definitely recommend a trial of a CGM for few months before deciding whether she needs medications or not.  If so, we discussed that metformin is the first-line.  GLP-1 receptor agonist would also be an option especially as she is very interested in losing weight.  We did discuss that the long-term consequences of using these  medications are not known. -We discussed about how to improve diet, also given written suggestions.  She will like to hold off a referral to nutrition for now but we may revisit this in the future. - I suggested to:  Patient Instructions  Try to get the Freestyle Libre 3 CGM.  Please come back in 3 months.  - Strongly advised her to start checking sugars at different times of the day (ideally with a CGM)- check 4x a day, rotating checks - discussed about CBG targets for treatment: 80-130 mg/dL before meals and <161 mg/dL after meals; target [id number] <7%. - given foot care handout and explained the principles  - given instructions for hypoglycemia management "15-15 rule"  - advised for yearly eye exams  - Return to clinic in 3 mo    2. HL - Reviewed latest lipid panel from 10/2022: LDL above target, but much improved in 5 years: Lab Results  Component Value Date   CHOL 201 (  H) 11/09/2022   HDL 80.00 11/09/2022   LDLCALC 103 (H) 11/09/2022   LDLDIRECT 168.0 01/02/2018   TRIG 92.0 11/09/2022   CHOLHDL 3 11/09/2022  - Continues the statin without side effects.  She is on Crestor 5 mg daily.  She tolerates this well.  Carlus Pavlov, MD PhD [facility] Endocrinology

## 2023-06-10 ENCOUNTER — Encounter: Payer: Self-pay | Admitting: Rehabilitative and Restorative Service Providers"

## 2023-06-14 ENCOUNTER — Encounter: Payer: Self-pay | Admitting: Rehabilitative and Restorative Service Providers"

## 2023-06-16 ENCOUNTER — Ambulatory Visit: Payer: PPO | Attending: Family Medicine | Admitting: Physical Therapy

## 2023-06-16 DIAGNOSIS — G8929 Other chronic pain: Secondary | ICD-10-CM | POA: Insufficient documentation

## 2023-06-16 DIAGNOSIS — M25561 Pain in right knee: Secondary | ICD-10-CM | POA: Insufficient documentation

## 2023-06-16 DIAGNOSIS — M6281 Muscle weakness (generalized): Secondary | ICD-10-CM | POA: Insufficient documentation

## 2023-06-16 DIAGNOSIS — M25661 Stiffness of right knee, not elsewhere classified: Secondary | ICD-10-CM | POA: Insufficient documentation

## 2023-06-16 NOTE — Therapy (Signed)
 OUTPATIENT PHYSICAL THERAPY TREATMENT NOTE  Patient Name: Jean Smith MRN: [id number] DOB:04-Feb-1951, 73 y.o., female Today's Date: 06/16/2023    END OF SESSION:  PT End of Session - 06/16/23 1150     Visit Number 18    Date for PT Re-Evaluation 07/27/23    Authorization Type [employer]    Progress Note Due on Visit 20    PT Start Time 1150    PT Stop Time 1230    PT Time Calculation (min) 40 min    Activity Tolerance Patient tolerated treatment well             Past Medical History:  Diagnosis Date   Allergy    Anxiety    Aortic regurgitation    moderate by echo 10/2020   Arthritis    Cancer (HCC)    skin   Cataract    bilateral - MD is just watching    Diverticulosis    Fuchs' endothelial dystrophy    follows with optho regularly    Gestational diabetes    Heart murmur    MVP    History of depression    HSV-2 infection    Hyperlipidemia    Hyperplastic colon polyp    Mitral valve prolapse    mild to moderate MR by echo 10/2020   PVC's (premature ventricular contractions)    intol of BB, sxc palpitations r/t stress   RVOT ventricular tachycardia/PVCs    EP eval 01/2013 for freq PVCs   Past Surgical History:  Procedure Laterality Date   CESAREAN SECTION     x 3   COLONOSCOPY     KNEE SURGERY Right    MANDIBLE SURGERY     right side in front of ear   MOHS SURGERY  2021   POLYPECTOMY     WISDOM TOOTH EXTRACTION     Patient Active Problem List   Diagnosis Date Noted   AC (acromioclavicular) arthritis 05/28/2022   Mitral valve prolapse    Aortic regurgitation    NSVT (nonsustained ventricular tachycardia) (HCC) 09/25/2020   Diabetes mellitus type 2 with complications (HCC) 08/27/2020   Chronic pain of both shoulders 05/05/2018   Other fatigue 01/02/2018   Allergic rhinitis 01/02/2018   Routine general medical examination at a health care facility 09/01/2015   Hyperlipidemia associated with type 2 diabetes mellitus (HCC) 09/01/2015    Varicose vein 08/27/2014   Primary localized osteoarthrosis, lower leg 10/30/2013   RVOT ventricular tachycardia/PVCs 01/31/2013   Abnormal stress echo 12/11/2012    PCP: Rollene Almarie LABOR, MD  REFERRING PROVIDER: Claudene Arthea HERO, DO  REFERRING DIAG: [phone] (ICD-10-CM) - Chronic pain of right knee  THERAPY DIAG:  Chronic pain of right knee  Stiffness of right knee, not elsewhere classified  Muscle weakness (generalized)  Rationale for Evaluation and Treatment: Rehabilitation  ONSET DATE: 03/31/2023  SUBJECTIVE:   SUBJECTIVE STATEMENT: My knee feels cranky on this (the Nu-Step).  My A1C was a little better.  I got a glucose monitor but I don't know how to use it yet.   PERTINENT HISTORY: Knee scope approx 15 years ago (right knee) PAIN:   Are you having pain? Yes: NPRS scale: 3-4/10 Pain location: bil knees right > left Pain description: aching, sharp Aggravating factors: walking, standing, bending,stooping, squatting Relieving factors: rest, meds  PRECAUTIONS: Fall  RED FLAGS: None   WEIGHT BEARING RESTRICTIONS: No  FALLS:  Has patient fallen in last 6 months? No  LIVING ENVIRONMENT: Lives with: lives  with their spouse Lives in: House/apartment Stairs: Yes: Internal: 12 steps; on right going up and External: 5 steps; on right going up Has following equipment at home: None  OCCUPATION: retired  PLOF: Independent, Independent with basic ADLs, Independent with household mobility without device, Independent with community mobility without device, Independent with homemaking with ambulation, Independent with gait, and Independent with transfers  PATIENT GOALS: to be able to walk and get up and down from a chair and do her routine daily activities without pain and without her knee giving way  NEXT MD VISIT: prn  OBJECTIVE:  Note: Objective measures were completed at Evaluation unless otherwise noted.  DIAGNOSTIC FINDINGS:  05/30/22: RIGHT  KNEE 3 VIEWS  COMPARISON:  Bilateral knee radiographs May 19th, 2015  FINDINGS: No acute fracture or dislocation. No joint effusion. Mild lateral compartment subchondral sclerosis and cystic changes with mild osteophytosis. No soft tissue abnormalities.  IMPRESSION: Mild lateral compartment osteoarthritis.  07/28/22 Left knee: Resulted by: Claudene Arthea HERO, DO Performed: 07/28/22 1411 - 07/28/22 1411  Accession number: [id number] Resulting lab: [facility]  Narrative: Limited muscular skeletal ultrasound was performed and interpreted by CLAUDENE ARTHEA, M Limited ultrasound continues to show the patient does have some hypoechoic changes in the patellofemoral joint noted.  Some degenerative changes of the meniscus noted. Impression: Improvement in inflammation but still continue effusion    PATIENT SURVEYS:  Eval: FOTO 38, predicted 55 05/09/2023:  FOTO 49 12/18:  54  COGNITION: Overall cognitive status: Within functional limits for tasks assessed     SENSATION: WFL   POSTURE:  slight knee valgus bilaterally  PALPATION: Mod crepitus noted on seated open chain flexion/extension  LOWER EXTREMITY ROM:  WNL LOWER EXTREMITY MMT:  Generally 4/5 with exception of bilateral knee flexion 4-/5 and bilateral hip abd 4-/5 and bilateral hip ER 3+/5  LOWER EXTREMITY SPECIAL TESTS:  Knee special tests: Patellafemoral grind test: positive   FUNCTIONAL TESTS:  Eval: 5 times sit to stand: 24 sec Timed up and go (TUG): 14.5 sec  05/03/2023: 5 times sit to stand:  18.36 sec without UE use Timed up and go (TUG): 8.00 sec 3 minute walk test: 612 ft without increased pain with RPE of 3/10  12/18 5x 20.16 (painful secondary to recent fall and prolonged standing yesterday) 6 MWT 1140 3/10 knee pain, back pain  3 MWT 550 feet  GAIT: Distance walked: 50 feet Assistive device utilized: None Level of assistance: Complete Independence Comments: antalgic, slow, right LE  externally rotated, guarded, short step length   TODAY'S TREATMENT:    DATE: 06/16/2023 Nu-Step L 2 (green machine) 8 min with PT present to discuss status; pt initially reports her knee feels cranky on the Nu-Step but after 1-2 minutes started to feel good 2 inch step ups single arm support 10x right/left  (single arm support) Blue band above knees side stepping 8 laps at the railing 4 inch forward step up  with railing support on right 5x on right, 10x on left 4 inch lateral step up with railing support 8x on right  Dead lifting 10# KB to tall cone 10x Leg Press (seat at 7) 75# 2x10 bil Standing bil calf raises 10x Seated HS green band high on leg for comfort (avoid letting band roll) and slider (therapist holding) 15x right/left  DATE: 06/09/2023 Recumbent bike level 1 10 min with PT present to discuss status Leg Press (seat at 7) 75# 2x10 bil Single Leg Press 35# 15x right/left Standing bil calf raises  10x Seated HS green band high on leg for comfort and slider 15x right/left Side stepping with green band above thighs 5 laps Attempted Spanish squat style with purple heavy band behind both knees with isometric hold 3x 10 sec hold (painful on side of knee) Isometric holds in partial squat (worked better, more comfortable) 3x 10 sec holds   DATE: 05/26/2023 Recumbent bike level 1 x 10 min with PT present to discuss status FOTO Goal setting 6 MWT 5x STS Green band HS curls (band placed high on leg for comfort) 15x right/left Leg Press (seat at 7)  70# 20x 2 rounds  DATE: 05/30/2023 Recumbent bike level 2 6 min with PT present to discuss status Seated hamstring stretch 2x20 sec bilat Seated with 4# ankle weight:  LAQ and marching.  2x10 each bilat Standing high marching 15 ft down hallway and back with 4# ankle weights Side stepping with 4# ankle weights 2x10 ft bilaterally Seated hamstring curl with green tband 2x10 Box stepping with 4 dots on floor x5 each direction Leg  Press (seat at 7) 80# 2x10 Single Leg Press 40# x10 left LE, 30# x10 right LE   DATE: 05/26/2023 Recumbent bike level 1 x 8 min with PT present to discuss status Mini trampoline:weight shifting side to side, front to back, marching, light bounce 1 min each Medium pink TKEs bil 20x Seated HS curl red power cord and slider 20x right/left Spanish squat style/sit to stand  with medium power cord behind knees 3x (painful so discontinued) Seated single leg press into red ball isometric 5 sec holds 10x right/left Leg Press (seat at 7)  75# 15x Single Leg Press (seat 7) 35# 15x right/left (turns toe in for comfort)    PATIENT EDUCATION:  Education details: Initiated HEP Person educated: Patient Education method: Programmer, Multimedia, Facilities Manager, Verbal cues, and Handouts Education comprehension: verbalized understanding, returned demonstration, and verbal cues required  HOME EXERCISE PROGRAM: Access Code: [email or web address] URL: [email or web address] Date: 04/12/2023 Prepared by: Glade Pesa  Exercises - Long Sitting Quad Set  - 1 x daily - 7 x weekly - 3 sets - 10 reps - Supine Knee Extension Strengthening  - 1 x daily - 7 x weekly - 3 sets - 10 reps - Seated Long Arc Quad  - 1 x daily - 7 x weekly - 3 sets - 10 reps - Straight Leg Raise  - 1 x daily - 7 x weekly - 1 sets - 10 reps - Clamshell  - 1 x daily - 7 x weekly - 1 sets - 10 reps - Seated Hamstring Curl with Anchored Resistance  - 1 x daily - 7 x weekly - 1 sets - 10 reps - Standing Heel Raise with Support  - 1 x daily - 7 x weekly - 1 sets - 10 reps  ASSESSMENT:  CLINICAL IMPRESSION: The patient reports some fearfulness and lack of exposure to exercise, especially at the gym.  We continue to gradually progress resistance exercise to promote strength to the muscles and bones surrounding the knee joint as well as in the hip and ankle.  Able to tolerate low volume reps with small forward and lateral step ups today with a  minimal increase in pain for a short duration.  Recommend continued low load, gradual progression to continue building her confidence to perform exercise.  At the end of session she reports she feels pretty good!   OBJECTIVE IMPAIRMENTS: Abnormal gait, decreased balance, decreased knowledge of use of DME, decreased mobility, difficulty  walking, decreased strength, increased edema, increased fascial restrictions, increased muscle spasms, impaired flexibility, postural dysfunction, obesity, and pain.   ACTIVITY LIMITATIONS: lifting, bending, sitting, standing, squatting, sleeping, stairs, transfers, bed mobility, bathing, toileting, dressing, and caring for others  PARTICIPATION LIMITATIONS: meal prep, cleaning, laundry, driving, shopping, community activity, yard work, and church  PERSONAL FACTORS: Fitness, Time since onset of injury/illness/exacerbation, and 1-2 comorbidities: anxiety and obesity  are also affecting patient's functional outcome.   REHAB POTENTIAL: Fair due to possible mechanical meniscus injury or joint deterioration  CLINICAL DECISION MAKING: Evolving/moderate complexity  EVALUATION COMPLEXITY: Moderate   GOALS: Goals reviewed with patient? Yes  SHORT TERM GOALS: Target date: 05/04/2023   Patient will be independent with initial HEP  Baseline: Goal status: MET on 05/03/2023  2.  Pain report to be no greater than 4/10  Baseline:  Goal status: Ongoing   LONG TERM GOALS: Target date: 07/27/2023   Patient to be independent with advanced HEP  Baseline:  Goal status: Ongoing  2.  Patient to report pain no greater than 2/10  Baseline:  Goal status: INITIAL  3.  Patient to be able to bend, stoop and squat with pain no greater than 2/10  Baseline:  Goal status: INITIAL  4.  Patient to be able to ascend and descend steps without pain or no greater than 2/10  Baseline:  Goal status: Ongoing  5.  Patient to be able to sleep through the night  Baseline:   Goal status: Ongoing  6.  Patient to report 85% improvement in overall symptoms  Baseline:  Goal status: Ongoing 7.  Six minute walk test 1200 feet indicating improved gait speed and strength.  new  8. Able to rise sit to stand from a standard chair with minimal knee pain (5x STS <17 sec) new   PLAN:  PT FREQUENCY: 1-2x/week  PT DURATION: 8 weeks  PLANNED INTERVENTIONS: 97110-Therapeutic exercises, 97530- Therapeutic activity, [id number]- Neuromuscular re-education, 97535- Self Care, [id number]- Manual therapy, [id number]- Gait training, [phone]- Aquatic Therapy, [phone]- Splinting, 97014- Electrical stimulation (unattended), [id number]- Electrical stimulation (manual), 97016- Vasopneumatic device, [id number]- Ultrasound, [id number]- Ionotophoresis 4mg /ml Dexamethasone, Patient/Family education, Balance training, Stair training, Taping, Dry Needling, Joint mobilization, Scar mobilization, Compression bandaging, Vestibular training, Visual/preceptual remediation/compensation, Cryotherapy, and Moist heat  PLAN FOR NEXT SESSION:  2 or 4 inch step ups;  leg press; quad, HS, glute strengthening in unloaded positions and partial load  Glade Pesa, PT 06/16/23 1:57 PM Phone: [phone] Fax: [phone]

## 2023-06-20 ENCOUNTER — Ambulatory Visit: Payer: PPO | Admitting: Rehabilitative and Restorative Service Providers"

## 2023-06-22 ENCOUNTER — Ambulatory Visit: Payer: PPO | Admitting: Physical Therapy

## 2023-06-22 DIAGNOSIS — M6281 Muscle weakness (generalized): Secondary | ICD-10-CM

## 2023-06-22 DIAGNOSIS — M25561 Pain in right knee: Secondary | ICD-10-CM | POA: Diagnosis not present

## 2023-06-22 DIAGNOSIS — M25661 Stiffness of right knee, not elsewhere classified: Secondary | ICD-10-CM

## 2023-06-22 DIAGNOSIS — G8929 Other chronic pain: Secondary | ICD-10-CM

## 2023-06-22 NOTE — Therapy (Signed)
 OUTPATIENT PHYSICAL THERAPY TREATMENT NOTE  Patient Name: Jean Smith MRN: [id number] DOB:1951-03-20, 73 y.o., female Today's Date: 06/22/2023    END OF SESSION:  PT End of Session - 06/22/23 0940     Visit Number 19    Date for PT Re-Evaluation 07/27/23    Authorization Type [employer]    Progress Note Due on Visit 20    PT Start Time 0939   late   PT Stop Time 1015    PT Time Calculation (min) 36 min    Activity Tolerance Patient tolerated treatment well             Past Medical History:  Diagnosis Date   Allergy    Anxiety    Aortic regurgitation    moderate by echo 10/2020   Arthritis    Cancer (HCC)    skin   Cataract    bilateral - MD is just watching    Diverticulosis    Fuchs' endothelial dystrophy    follows with optho regularly    Gestational diabetes    Heart murmur    MVP    History of depression    HSV-2 infection    Hyperlipidemia    Hyperplastic colon polyp    Mitral valve prolapse    mild to moderate MR by echo 10/2020   PVC's (premature ventricular contractions)    intol of BB, sxc palpitations r/t stress   RVOT ventricular tachycardia/PVCs    EP eval 01/2013 for freq PVCs   Past Surgical History:  Procedure Laterality Date   CESAREAN SECTION     x 3   COLONOSCOPY     KNEE SURGERY Right    MANDIBLE SURGERY     right side in front of ear   MOHS SURGERY  2021   POLYPECTOMY     WISDOM TOOTH EXTRACTION     Patient Active Problem List   Diagnosis Date Noted   AC (acromioclavicular) arthritis 05/28/2022   Mitral valve prolapse    Aortic regurgitation    NSVT (nonsustained ventricular tachycardia) (HCC) 09/25/2020   Diabetes mellitus type 2 with complications (HCC) 08/27/2020   Chronic pain of both shoulders 05/05/2018   Other fatigue 01/02/2018   Allergic rhinitis 01/02/2018   Routine general medical examination at a health care facility 09/01/2015   Hyperlipidemia associated with type 2 diabetes mellitus (HCC)  09/01/2015   Varicose vein 08/27/2014   Primary localized osteoarthrosis, lower leg 10/30/2013   RVOT ventricular tachycardia/PVCs 01/31/2013   Abnormal stress echo 12/11/2012    PCP: Rollene Almarie LABOR, MD  REFERRING PROVIDER: Claudene Arthea HERO, DO  REFERRING DIAG: [phone] (ICD-10-CM) - Chronic pain of right knee  THERAPY DIAG:  Chronic pain of right knee  Stiffness of right knee, not elsewhere classified  Muscle weakness (generalized)  Rationale for Evaluation and Treatment: Rehabilitation  ONSET DATE: 03/31/2023  SUBJECTIVE:   SUBJECTIVE STATEMENT: I almost cancelled.  I got my brace yesterday.   I was supposed to wear it 1 hour and I wore it 4 hours.  I feel it up through my hip to my neck.   PERTINENT HISTORY: Knee scope approx 15 years ago (right knee) PAIN:   Are you having pain? Yes: NPRS scale: 3-4/10 Pain location: bil knees right > left Pain description: aching, sharp Aggravating factors: walking, standing, bending,stooping, squatting Relieving factors: rest, meds  PRECAUTIONS: Fall  RED FLAGS: None   WEIGHT BEARING RESTRICTIONS: No  FALLS:  Has patient fallen in last 6  months? No  LIVING ENVIRONMENT: Lives with: lives with their spouse Lives in: House/apartment Stairs: Yes: Internal: 12 steps; on right going up and External: 5 steps; on right going up Has following equipment at home: None  OCCUPATION: retired  PLOF: Independent, Independent with basic ADLs, Independent with household mobility without device, Independent with community mobility without device, Independent with homemaking with ambulation, Independent with gait, and Independent with transfers  PATIENT GOALS: to be able to walk and get up and down from a chair and do her routine daily activities without pain and without her knee giving way  NEXT MD VISIT: prn  OBJECTIVE:  Note: Objective measures were completed at Evaluation unless otherwise noted.  DIAGNOSTIC  FINDINGS:  05/30/22: RIGHT KNEE 3 VIEWS  COMPARISON:  Bilateral knee radiographs May 19th, 2015  FINDINGS: No acute fracture or dislocation. No joint effusion. Mild lateral compartment subchondral sclerosis and cystic changes with mild osteophytosis. No soft tissue abnormalities.  IMPRESSION: Mild lateral compartment osteoarthritis.  07/28/22 Left knee: Resulted by: Claudene Arthea HERO, DO Performed: 07/28/22 1411 - 07/28/22 1411  Accession number: [id number] Resulting lab: [facility]  Narrative: Limited muscular skeletal ultrasound was performed and interpreted by CLAUDENE ARTHEA, M Limited ultrasound continues to show the patient does have some hypoechoic changes in the patellofemoral joint noted.  Some degenerative changes of the meniscus noted. Impression: Improvement in inflammation but still continue effusion    PATIENT SURVEYS:  Eval: FOTO 38, predicted 55 05/09/2023:  FOTO 49 12/18:  54  COGNITION: Overall cognitive status: Within functional limits for tasks assessed     SENSATION: WFL   POSTURE:  slight knee valgus bilaterally  PALPATION: Mod crepitus noted on seated open chain flexion/extension  LOWER EXTREMITY ROM:  WNL LOWER EXTREMITY MMT:  Generally 4/5 with exception of bilateral knee flexion 4-/5 and bilateral hip abd 4-/5 and bilateral hip ER 3+/5  LOWER EXTREMITY SPECIAL TESTS:  Knee special tests: Patellafemoral grind test: positive   FUNCTIONAL TESTS:  Eval: 5 times sit to stand: 24 sec Timed up and go (TUG): 14.5 sec  05/03/2023: 5 times sit to stand:  18.36 sec without UE use Timed up and go (TUG): 8.00 sec 3 minute walk test: 612 ft without increased pain with RPE of 3/10  12/18 5x 20.16 (painful secondary to recent fall and prolonged standing yesterday) 6 MWT 1140 3/10 knee pain, back pain  3 MWT 550 feet  GAIT: Distance walked: 50 feet Assistive device utilized: None Level of assistance: Complete Independence Comments:  antalgic, slow, right LE externally rotated, guarded, short step length   TODAY'S TREATMENT:    DATE: 06/22/2023 Bike 8 min while discussing status  4 inch lateral step up and overs 10x (2 hand support) moderate discomfort Bil calf raises 10x 4 inch step ups 5 reps right/left Dead lifting 10# KB to tall cone 10x Leg Press (seat at 7) 75# 2x10 bil; single leg 35# 15x left; increased 40# on right/left 15x Staggered stance deadlift 10# 10x right/left Nu-Step L 5 (green machine) 8 min (increased resistance from last time no pain)  DATE: 06/16/2023 Nu-Step L 2 (green machine) 8 min with PT present to discuss status; pt initially reports her knee feels cranky on the Nu-Step but after 1-2 minutes started to feel good 2 inch step ups single arm support 10x right/left  (single arm support) Blue band above knees side stepping 8 laps at the railing 4 inch forward step up  with railing support on right 5x on right,  10x on left 4 inch lateral step up with railing support 8x on right  Dead lifting 10# KB to tall cone 10x Leg Press (seat at 7) 75# 2x10 bil Standing bil calf raises 10x Seated HS green band high on leg for comfort (avoid letting band roll) and slider (therapist holding) 15x right/left  DATE: 06/09/2023 Recumbent bike level 1 10 min with PT present to discuss status Leg Press (seat at 7) 75# 2x10 bil Single Leg Press 35# 15x right/left Standing bil calf raises 10x Seated HS green band high on leg for comfort and slider 15x right/left Side stepping with green band above thighs 5 laps Attempted Spanish squat style with purple heavy band behind both knees with isometric hold 3x 10 sec hold (painful on side of knee) Isometric holds in partial squat (worked better, more comfortable) 3x 10 sec holds   DATE: 05/26/2023 Recumbent bike level 1 x 10 min with PT present to discuss status FOTO Goal setting 6 MWT 5x STS Green band HS curls (band placed high on leg for comfort) 15x  right/left Leg Press (seat at 7)  70# 20x 2 rounds    PATIENT EDUCATION:  Education details: Initiated HEP Person educated: Patient Education method: Programmer, Multimedia, Facilities Manager, Verbal cues, and Handouts Education comprehension: verbalized understanding, returned demonstration, and verbal cues required  HOME EXERCISE PROGRAM: Access Code: [email or web address] URL: [email or web address] Date: 04/12/2023 Prepared by: Glade Pesa  Exercises - Long Sitting Quad Set  - 1 x daily - 7 x weekly - 3 sets - 10 reps - Supine Knee Extension Strengthening  - 1 x daily - 7 x weekly - 3 sets - 10 reps - Seated Long Arc Quad  - 1 x daily - 7 x weekly - 3 sets - 10 reps - Straight Leg Raise  - 1 x daily - 7 x weekly - 1 sets - 10 reps - Clamshell  - 1 x daily - 7 x weekly - 1 sets - 10 reps - Seated Hamstring Curl with Anchored Resistance  - 1 x daily - 7 x weekly - 1 sets - 10 reps - Standing Heel Raise with Support  - 1 x daily - 7 x weekly - 1 sets - 10 reps  ASSESSMENT:  CLINICAL IMPRESSION: Patient arrives not feeling her best after increased discomfort from wearing her new leg brace for an extended period yesterday.  Despite this she is able to perform strengthening ex's targeting quads, HS, gastroc and gluteals.  Lateral and forward step ups produce some pain but pt feels she is able to complete repetitions.  I always feel better when I leave, I never feel worse.  OBJECTIVE IMPAIRMENTS: Abnormal gait, decreased balance, decreased knowledge of use of DME, decreased mobility, difficulty walking, decreased strength, increased edema, increased fascial restrictions, increased muscle spasms, impaired flexibility, postural dysfunction, obesity, and pain.   ACTIVITY LIMITATIONS: lifting, bending, sitting, standing, squatting, sleeping, stairs, transfers, bed mobility, bathing, toileting, dressing, and caring for others  PARTICIPATION LIMITATIONS: meal prep, cleaning, laundry, driving,  shopping, community activity, yard work, and church  PERSONAL FACTORS: Fitness, Time since onset of injury/illness/exacerbation, and 1-2 comorbidities: anxiety and obesity  are also affecting patient's functional outcome.   REHAB POTENTIAL: Fair due to possible mechanical meniscus injury or joint deterioration  CLINICAL DECISION MAKING: Evolving/moderate complexity  EVALUATION COMPLEXITY: Moderate   GOALS: Goals reviewed with patient? Yes  SHORT TERM GOALS: Target date: 05/04/2023   Patient will be independent with initial HEP  Baseline: Goal status: MET on  05/03/2023  2.  Pain report to be no greater than 4/10  Baseline:  Goal status: Ongoing   LONG TERM GOALS: Target date: 07/27/2023   Patient to be independent with advanced HEP  Baseline:  Goal status: Ongoing  2.  Patient to report pain no greater than 2/10  Baseline:  Goal status: INITIAL  3.  Patient to be able to bend, stoop and squat with pain no greater than 2/10  Baseline:  Goal status: INITIAL  4.  Patient to be able to ascend and descend steps without pain or no greater than 2/10  Baseline:  Goal status: Ongoing  5.  Patient to be able to sleep through the night  Baseline:  Goal status: Ongoing  6.  Patient to report 85% improvement in overall symptoms  Baseline:  Goal status: Ongoing 7.  Six minute walk test 1200 feet indicating improved gait speed and strength.  new  8. Able to rise sit to stand from a standard chair with minimal knee pain (5x STS <17 sec) new   PLAN:  PT FREQUENCY: 1-2x/week  PT DURATION: 8 weeks  PLANNED INTERVENTIONS: 97110-Therapeutic exercises, 97530- Therapeutic activity, [id number]- Neuromuscular re-education, 97535- Self Care, [id number]- Manual therapy, [id number]- Gait training, [phone]- Aquatic Therapy, [id number]- Splinting, 97014- Electrical stimulation (unattended), [id number]- Electrical stimulation (manual), 97016- Vasopneumatic device, [id number]- Ultrasound, [id number]- Ionotophoresis 4mg /ml  Dexamethasone, Patient/Family education, Balance training, Stair training, Taping, Dry Needling, Joint mobilization, Scar mobilization, Compression bandaging, Vestibular training, Visual/preceptual remediation/compensation, Cryotherapy, and Moist heat  PLAN FOR NEXT SESSION:  2 or 4 inch step ups;  dead lifts; leg press; quad, HS, glute strengthening in unloaded positions and partial load  Glade Pesa, PT 06/22/23 1:58 PM Phone: [phone] Fax: [phone]

## 2023-06-23 ENCOUNTER — Encounter: Payer: PPO | Admitting: Rehabilitative and Restorative Service Providers"

## 2023-06-24 ENCOUNTER — Ambulatory Visit: Payer: PPO | Admitting: Physical Therapy

## 2023-06-24 DIAGNOSIS — G8929 Other chronic pain: Secondary | ICD-10-CM

## 2023-06-24 DIAGNOSIS — M25661 Stiffness of right knee, not elsewhere classified: Secondary | ICD-10-CM

## 2023-06-24 DIAGNOSIS — M6281 Muscle weakness (generalized): Secondary | ICD-10-CM

## 2023-06-24 DIAGNOSIS — M25561 Pain in right knee: Secondary | ICD-10-CM | POA: Diagnosis not present

## 2023-06-24 NOTE — Therapy (Signed)
 OUTPATIENT PHYSICAL THERAPY TREATMENT NOTE  Patient Name: Jean Smith MRN: [id number] DOB:Jul 29, 1950, 73 y.o., female Today's Date: 06/24/2023  Progress Note Reporting Period 05/09/23 to 06/24/23  See note below for Objective Data and Assessment of Progress/Goals.      END OF SESSION:  PT End of Session - 06/24/23 1023     Visit Number 20    Date for PT Re-Evaluation 07/27/23    Authorization Type [employer]    Progress Note Due on Visit 30    PT Start Time 1021    PT Stop Time 1100    PT Time Calculation (min) 39 min    Activity Tolerance Patient tolerated treatment well             Past Medical History:  Diagnosis Date   Allergy    Anxiety    Aortic regurgitation    moderate by echo 10/2020   Arthritis    Cancer (HCC)    skin   Cataract    bilateral - MD is just watching    Diverticulosis    Fuchs' endothelial dystrophy    follows with optho regularly    Gestational diabetes    Heart murmur    MVP    History of depression    HSV-2 infection    Hyperlipidemia    Hyperplastic colon polyp    Mitral valve prolapse    mild to moderate MR by echo 10/2020   PVC's (premature ventricular contractions)    intol of BB, sxc palpitations r/t stress   RVOT ventricular tachycardia/PVCs    EP eval 01/2013 for freq PVCs   Past Surgical History:  Procedure Laterality Date   CESAREAN SECTION     x 3   COLONOSCOPY     KNEE SURGERY Right    MANDIBLE SURGERY     right side in front of ear   MOHS SURGERY  2021   POLYPECTOMY     WISDOM TOOTH EXTRACTION     Patient Active Problem List   Diagnosis Date Noted   AC (acromioclavicular) arthritis 05/28/2022   Mitral valve prolapse    Aortic regurgitation    NSVT (nonsustained ventricular tachycardia) (HCC) 09/25/2020   Diabetes mellitus type 2 with complications (HCC) 08/27/2020   Chronic pain of both shoulders 05/05/2018   Other fatigue 01/02/2018   Allergic rhinitis 01/02/2018   Routine general  medical examination at a health care facility 09/01/2015   Hyperlipidemia associated with type 2 diabetes mellitus (HCC) 09/01/2015   Varicose vein 08/27/2014   Primary localized osteoarthrosis, lower leg 10/30/2013   RVOT ventricular tachycardia/PVCs 01/31/2013   Abnormal stress echo 12/11/2012    PCP: Rollene Almarie LABOR, MD  REFERRING PROVIDER: Claudene Arthea HERO, DO  REFERRING DIAG: [phone] (ICD-10-CM) - Chronic pain of right knee  THERAPY DIAG:  Chronic pain of right knee  Stiffness of right knee, not elsewhere classified  Muscle weakness (generalized)  Rationale for Evaluation and Treatment: Rehabilitation  ONSET DATE: 03/31/2023  SUBJECTIVE:   SUBJECTIVE STATEMENT: I did a lot of walking yesterday, there was a lot of sharpness in the knee.  I felt it in my neck maybe from the kettlebell or the Nu-Step.  I don't have my glucose monitor on yet.  I got so frustrated with that brace.     PERTINENT HISTORY: Knee scope approx 15 years ago (right knee) PAIN:   Are you having pain? Yes: NPRS scale: over 5/10 Pain location: bil knees right > left Pain description: aching,  sharp Aggravating factors: walking, standing, bending,stooping, squatting Relieving factors: rest, meds  PRECAUTIONS: Fall  RED FLAGS: None   WEIGHT BEARING RESTRICTIONS: No  FALLS:  Has patient fallen in last 6 months? No  LIVING ENVIRONMENT: Lives with: lives with their spouse Lives in: House/apartment Stairs: Yes: Internal: 12 steps; on right going up and External: 5 steps; on right going up Has following equipment at home: None  OCCUPATION: retired  PLOF: Independent, Independent with basic ADLs, Independent with household mobility without device, Independent with community mobility without device, Independent with homemaking with ambulation, Independent with gait, and Independent with transfers  PATIENT GOALS: to be able to walk and get up and down from a chair and do her  routine daily activities without pain and without her knee giving way  NEXT MD VISIT: prn  OBJECTIVE:  Note: Objective measures were completed at Evaluation unless otherwise noted.  DIAGNOSTIC FINDINGS:  05/30/22: RIGHT KNEE 3 VIEWS  COMPARISON:  Bilateral knee radiographs May 19th, 2015  FINDINGS: No acute fracture or dislocation. No joint effusion. Mild lateral compartment subchondral sclerosis and cystic changes with mild osteophytosis. No soft tissue abnormalities.  IMPRESSION: Mild lateral compartment osteoarthritis.  07/28/22 Left knee: Resulted by: Claudene Arthea HERO, DO Performed: 07/28/22 1411 - 07/28/22 1411  Accession number: [id number] Resulting lab: [facility]  Narrative: Limited muscular skeletal ultrasound was performed and interpreted by CLAUDENE ARTHEA, M Limited ultrasound continues to show the patient does have some hypoechoic changes in the patellofemoral joint noted.  Some degenerative changes of the meniscus noted. Impression: Improvement in inflammation but still continue effusion    PATIENT SURVEYS:  Eval: FOTO 38, predicted 55 05/09/2023:  FOTO 49 12/18:  54 1/10: 55 taken out of FOTO  COGNITION: Overall cognitive status: Within functional limits for tasks assessed     SENSATION: WFL   POSTURE:  slight knee valgus bilaterally  PALPATION: Mod crepitus noted on seated open chain flexion/extension  LOWER EXTREMITY ROM:  WNL LOWER EXTREMITY MMT:  Generally 4/5 with exception of bilateral knee flexion 4-/5 and bilateral hip abd 4-/5 and bilateral hip ER 3+/5  LOWER EXTREMITY SPECIAL TESTS:  Knee special tests: Patellafemoral grind test: positive   FUNCTIONAL TESTS:  Eval: 5 times sit to stand: 24 sec Timed up and go (TUG): 14.5 sec  05/03/2023: 5 times sit to stand:  18.36 sec without UE use Timed up and go (TUG): 8.00 sec 3 minute walk test: 612 ft without increased pain with RPE of 3/10  12/18 5x 20.16 (painful secondary  to recent fall and prolonged standing yesterday) 6 MWT 1140 3/10 knee pain, back pain  3 MWT 550 feet  06/24/23: 5x STS painful: 18.84 no hands TUG 11 sec   GAIT: Distance walked: 50 feet Assistive device utilized: None Level of assistance: Complete Independence Comments: antalgic, slow, right LE externally rotated, guarded, short step length   TODAY'S TREATMENT:    DATE: 06/24/2023 Bike 10 min while discussing status  FOTO 5x STS TUG Leg Press (seat at 7) 80# 2x10 bil; single leg; 40# on right/left 15x Staggered stance Bil calf raises 16x Dead lifting pair of 5# weights 12x  Resisted backward walk backward 10# 5x (cue to keep handles close to the body)  Nu-Step L 5 (green machine) 5 min (pt has pain initially, if I drive through my hip there's no pain)  DATE: 06/22/2023 Bike 8 min while discussing status  4 inch lateral step up and overs 10x (2 hand support) moderate discomfort Bil  calf raises 10x 4 inch step ups 5 reps right/left Dead lifting 10# KB to tall cone 10x Leg Press (seat at 7) 75# 2x10 bil; single leg 35# 15x left; increased 40# on right/left 15x Staggered stance deadlift 10# 10x right/left Nu-Step L 5 (green machine) 8 min (increased resistance from last time no pain)  DATE: 06/16/2023 Nu-Step L 2 (green machine) 8 min with PT present to discuss status; pt initially reports her knee feels cranky on the Nu-Step but after 1-2 minutes started to feel good 2 inch step ups single arm support 10x right/left  (single arm support) Blue band above knees side stepping 8 laps at the railing 4 inch forward step up  with railing support on right 5x on right, 10x on left 4 inch lateral step up with railing support 8x on right  Dead lifting 10# KB to tall cone 10x Leg Press (seat at 7) 75# 2x10 bil Standing bil calf raises 10x Seated HS green band high on leg for comfort (avoid letting band roll) and slider (therapist holding) 15x right/left  DATE: 06/09/2023 Recumbent  bike level 1 10 min with PT present to discuss status Leg Press (seat at 7) 75# 2x10 bil Single Leg Press 35# 15x right/left Standing bil calf raises 10x Seated HS green band high on leg for comfort and slider 15x right/left Side stepping with green band above thighs 5 laps Attempted Spanish squat style with purple heavy band behind both knees with isometric hold 3x 10 sec hold (painful on side of knee) Isometric holds in partial squat (worked better, more comfortable) 3x 10 sec holds    PATIENT EDUCATION:  Education details: Initiated HEP Person educated: Patient Education method: Programmer, Multimedia, Facilities Manager, Verbal cues, and Handouts Education comprehension: verbalized understanding, returned demonstration, and verbal cues required  HOME EXERCISE PROGRAM: Access Code: [email or web address] URL: [email or web address] Date: 04/12/2023 Prepared by: Glade Pesa  Exercises - Long Sitting Quad Set  - 1 x daily - 7 x weekly - 3 sets - 10 reps - Supine Knee Extension Strengthening  - 1 x daily - 7 x weekly - 3 sets - 10 reps - Seated Long Arc Quad  - 1 x daily - 7 x weekly - 3 sets - 10 reps - Straight Leg Raise  - 1 x daily - 7 x weekly - 1 sets - 10 reps - Clamshell  - 1 x daily - 7 x weekly - 1 sets - 10 reps - Seated Hamstring Curl with Anchored Resistance  - 1 x daily - 7 x weekly - 1 sets - 10 reps - Standing Heel Raise with Support  - 1 x daily - 7 x weekly - 1 sets - 10 reps  ASSESSMENT:  CLINICAL IMPRESSION: Nazaria reports frustration over continued knee pain although she has made good improvements in FOTO functional outcome score, 5x STS and TUG tests compared to start of care.  She did have a fall in December that exacerbated symptoms and more recently got a new brace, wore it for several hours and had marked pain following.  She has been able to continue with LE strengthening with some modifications.  Step ups and downs are the most painful so we limit number of reps and  keep the step height small.  Sybrina will continue to need a slower progression and exposure to exercise.    OBJECTIVE IMPAIRMENTS: Abnormal gait, decreased balance, decreased knowledge of use of DME, decreased mobility, difficulty walking, decreased strength, increased edema, increased fascial restrictions, increased muscle  spasms, impaired flexibility, postural dysfunction, obesity, and pain.   ACTIVITY LIMITATIONS: lifting, bending, sitting, standing, squatting, sleeping, stairs, transfers, bed mobility, bathing, toileting, dressing, and caring for others  PARTICIPATION LIMITATIONS: meal prep, cleaning, laundry, driving, shopping, community activity, yard work, and church  PERSONAL FACTORS: Fitness, Time since onset of injury/illness/exacerbation, and 1-2 comorbidities: anxiety and obesity  are also affecting patient's functional outcome.   REHAB POTENTIAL: Fair due to possible mechanical meniscus injury or joint deterioration  CLINICAL DECISION MAKING: Evolving/moderate complexity  EVALUATION COMPLEXITY: Moderate   GOALS: Goals reviewed with patient? Yes  SHORT TERM GOALS: Target date: 05/04/2023   Patient will be independent with initial HEP  Baseline: Goal status: MET on 05/03/2023  2.  Pain report to be no greater than 4/10  Baseline:  Goal status: Ongoing   LONG TERM GOALS: Target date: 07/27/2023   Patient to be independent with advanced HEP  Baseline:  Goal status: Ongoing  2.  Patient to report pain no greater than 2/10  Baseline:  Goal status: INITIAL  3.  Patient to be able to bend, stoop and squat with pain no greater than 2/10  Baseline:  Goal status: INITIAL  4.  Patient to be able to ascend and descend steps without pain or no greater than 2/10  Baseline:  Goal status: Ongoing  5.  Patient to be able to sleep through the night  Baseline:  Goal status: Ongoing  6.  Patient to report 85% improvement in overall symptoms  Baseline:  Goal status:  Ongoing 7.  Six minute walk test 1200 feet indicating improved gait speed and strength.  new  8. Able to rise sit to stand from a standard chair with minimal knee pain (5x STS <17 sec) new   PLAN:  PT FREQUENCY: 1-2x/week  PT DURATION: 8 weeks  PLANNED INTERVENTIONS: 97110-Therapeutic exercises, 97530- Therapeutic activity, [id number]- Neuromuscular re-education, 97535- Self Care, [id number]- Manual therapy, [id number]- Gait training, [phone]- Aquatic Therapy, [phone]- Splinting, 97014- Electrical stimulation (unattended), [id number]- Electrical stimulation (manual), 97016- Vasopneumatic device, [id number]- Ultrasound, [id number]- Ionotophoresis 4mg /ml Dexamethasone, Patient/Family education, Balance training, Stair training, Taping, Dry Needling, Joint mobilization, Scar mobilization, Compression bandaging, Vestibular training, Visual/preceptual remediation/compensation, Cryotherapy, and Moist heat  PLAN FOR NEXT SESSION:  2 or 4 inch step ups;  dead lifts; leg press; quad, HS, glute strengthening in unloaded positions and partial load Glade Pesa, PT 06/24/23 11:13 AM Phone: [phone] Fax: [phone]

## 2023-06-28 ENCOUNTER — Ambulatory Visit: Payer: PPO | Admitting: Physical Therapy

## 2023-06-28 DIAGNOSIS — M25561 Pain in right knee: Secondary | ICD-10-CM | POA: Diagnosis not present

## 2023-06-28 DIAGNOSIS — M6281 Muscle weakness (generalized): Secondary | ICD-10-CM

## 2023-06-28 DIAGNOSIS — G8929 Other chronic pain: Secondary | ICD-10-CM

## 2023-06-28 DIAGNOSIS — M25661 Stiffness of right knee, not elsewhere classified: Secondary | ICD-10-CM

## 2023-06-28 NOTE — Therapy (Signed)
 OUTPATIENT PHYSICAL THERAPY TREATMENT NOTE  Patient Name: Jean Smith MRN: [id number] DOB:November 12, 1950, 73 y.o., female Today's Date: 06/28/2023     END OF SESSION:  PT End of Session - 06/28/23 1154     Visit Number 21    Date for PT Re-Evaluation 07/27/23    Authorization Type [employer]    Progress Note Due on Visit 30    PT Start Time 1151    PT Stop Time 1230    PT Time Calculation (min) 39 min    Activity Tolerance Patient tolerated treatment well             Past Medical History:  Diagnosis Date   Allergy    Anxiety    Aortic regurgitation    moderate by echo 10/2020   Arthritis    Cancer (HCC)    skin   Cataract    bilateral - MD is just watching    Diverticulosis    Fuchs' endothelial dystrophy    follows with optho regularly    Gestational diabetes    Heart murmur    MVP    History of depression    HSV-2 infection    Hyperlipidemia    Hyperplastic colon polyp    Mitral valve prolapse    mild to moderate MR by echo 10/2020   PVC's (premature ventricular contractions)    intol of BB, sxc palpitations r/t stress   RVOT ventricular tachycardia/PVCs    EP eval 01/2013 for freq PVCs   Past Surgical History:  Procedure Laterality Date   CESAREAN SECTION     x 3   COLONOSCOPY     KNEE SURGERY Right    MANDIBLE SURGERY     right side in front of ear   MOHS SURGERY  2021   POLYPECTOMY     WISDOM TOOTH EXTRACTION     Patient Active Problem List   Diagnosis Date Noted   AC (acromioclavicular) arthritis 05/28/2022   Mitral valve prolapse    Aortic regurgitation    NSVT (nonsustained ventricular tachycardia) (HCC) 09/25/2020   Diabetes mellitus type 2 with complications (HCC) 08/27/2020   Chronic pain of both shoulders 05/05/2018   Other fatigue 01/02/2018   Allergic rhinitis 01/02/2018   Routine general medical examination at a health care facility 09/01/2015   Hyperlipidemia associated with type 2 diabetes mellitus (HCC)  09/01/2015   Varicose vein 08/27/2014   Primary localized osteoarthrosis, lower leg 10/30/2013   RVOT ventricular tachycardia/PVCs 01/31/2013   Abnormal stress echo 12/11/2012    PCP: Rollene Almarie LABOR, MD  REFERRING PROVIDER: Claudene Arthea HERO, DO  REFERRING DIAG: [id number] (ICD-10-CM) - Chronic pain of right knee  THERAPY DIAG:  Chronic pain of right knee  Stiffness of right knee, not elsewhere classified  Muscle weakness (generalized)  Rationale for Evaluation and Treatment: Rehabilitation  ONSET DATE: 03/31/2023  SUBJECTIVE:   SUBJECTIVE STATEMENT: I don't think I'm going to be able to wear that brace. It's too complex.  My son will help me set up the glucose monitor.    PERTINENT HISTORY: Knee scope approx 15 years ago (right knee) PAIN:   Are you having pain? Yes: NPRS scale: 3/10 Pain location: bil knees right > left Pain description: aching, sharp Aggravating factors: walking, standing, bending,stooping, squatting Relieving factors: rest, meds  PRECAUTIONS: Fall  RED FLAGS: None   WEIGHT BEARING RESTRICTIONS: No  FALLS:  Has patient fallen in last 6 months? No  LIVING ENVIRONMENT: Lives with: lives with their  spouse Lives in: House/apartment Stairs: Yes: Internal: 12 steps; on right going up and External: 5 steps; on right going up Has following equipment at home: None  OCCUPATION: retired  PLOF: Independent, Independent with basic ADLs, Independent with household mobility without device, Independent with community mobility without device, Independent with homemaking with ambulation, Independent with gait, and Independent with transfers  PATIENT GOALS: to be able to walk and get up and down from a chair and do her routine daily activities without pain and without her knee giving way  NEXT MD VISIT: prn  OBJECTIVE:  Note: Objective measures were completed at Evaluation unless otherwise noted.  DIAGNOSTIC FINDINGS:  05/30/22: RIGHT  KNEE 3 VIEWS  COMPARISON:  Bilateral knee radiographs May 19th, 2015  FINDINGS: No acute fracture or dislocation. No joint effusion. Mild lateral compartment subchondral sclerosis and cystic changes with mild osteophytosis. No soft tissue abnormalities.  IMPRESSION: Mild lateral compartment osteoarthritis.  07/28/22 Left knee: Resulted by: Claudene Arthea HERO, DO Performed: 07/28/22 1411 - 07/28/22 1411  Accession number: [id number] Resulting lab: [facility]  Narrative: Limited muscular skeletal ultrasound was performed and interpreted by CLAUDENE ARTHEA, M Limited ultrasound continues to show the patient does have some hypoechoic changes in the patellofemoral joint noted.  Some degenerative changes of the meniscus noted. Impression: Improvement in inflammation but still continue effusion    PATIENT SURVEYS:  Eval: FOTO 38, predicted 55 05/09/2023:  FOTO 49 12/18:  54 1/10: 55 taken out of FOTO  COGNITION: Overall cognitive status: Within functional limits for tasks assessed     SENSATION: WFL   POSTURE:  slight knee valgus bilaterally  PALPATION: Mod crepitus noted on seated open chain flexion/extension  LOWER EXTREMITY ROM:  WNL LOWER EXTREMITY MMT:  Generally 4/5 with exception of bilateral knee flexion 4-/5 and bilateral hip abd 4-/5 and bilateral hip ER 3+/5  LOWER EXTREMITY SPECIAL TESTS:  Knee special tests: Patellafemoral grind test: positive   FUNCTIONAL TESTS:  Eval: 5 times sit to stand: 24 sec Timed up and go (TUG): 14.5 sec  05/03/2023: 5 times sit to stand:  18.36 sec without UE use Timed up and go (TUG): 8.00 sec 3 minute walk test: 612 ft without increased pain with RPE of 3/10  12/18 5x 20.16 (painful secondary to recent fall and prolonged standing yesterday) 6 MWT 1140 3/10 knee pain, back pain  3 MWT 550 feet  06/24/23: 5x STS painful: 18.84 no hands TUG 11 sec   GAIT: Distance walked: 50 feet Assistive device utilized:  None Level of assistance: Complete Independence Comments: antalgic, slow, right LE externally rotated, guarded, short step length   TODAY'S TREATMENT:    DATE: 06/28/2023 Bike 10 min while discussing status  Leg Press (seat at 7 upright seat) 80# 2x10 bil; single leg; 40# on right/left 20x Standing modified dead lifts to knee level holding pair of 7 pound weights 10x with verbal cues for forward gaze for neutral spine while hip hinging    Bil calf raises 10x2 holding 7# weight Resisted backward walk with belt:  backward 10# 5x;  forwards 5x (very challenging) Step ups onto 3 inch cushion 7x right/left  Pt attempted 6 inch right step up but too painful 2x Nu-Step L 5 (green machine) 7 min (I didn't like this machine at first but I like it now) DATE: 06/24/2023 Bike 10 min while discussing status  FOTO 5x STS TUG Leg Press (seat at 7) 80# 2x10 bil; single leg; 40# on right/left 15x Staggered stance  Bil calf raises 16x Dead lifting pair of 5# weights 12x  Resisted backward walk backward 10# 5x (cue to keep handles close to the body)  Nu-Step L 5 (green machine) 5 min (pt has pain initially, if I drive through my hip there's no pain)  DATE: 06/22/2023 Bike 8 min while discussing status  4 inch lateral step up and overs 10x (2 hand support) moderate discomfort Bil calf raises 10x 4 inch step ups 5 reps right/left Dead lifting 10# KB to tall cone 10x Leg Press (seat at 7) 75# 2x10 bil; single leg 35# 15x left; increased 40# on right/left 15x Staggered stance deadlift 10# 10x right/left Nu-Step L 5 (green machine) 8 min (increased resistance from last time no pain)  DATE: 06/16/2023 Nu-Step L 2 (green machine) 8 min with PT present to discuss status; pt initially reports her knee feels cranky on the Nu-Step but after 1-2 minutes started to feel good 2 inch step ups single arm support 10x right/left  (single arm support) Blue band above knees side stepping 8 laps at the railing 4 inch  forward step up  with railing support on right 5x on right, 10x on left 4 inch lateral step up with railing support 8x on right  Dead lifting 10# KB to tall cone 10x Leg Press (seat at 7) 75# 2x10 bil Standing bil calf raises 10x Seated HS green band high on leg for comfort (avoid letting band roll) and slider (therapist holding) 15x right/left  DATE: 06/09/2023 Recumbent bike level 1 10 min with PT present to discuss status Leg Press (seat at 7) 75# 2x10 bil Single Leg Press 35# 15x right/left Standing bil calf raises 10x Seated HS green band high on leg for comfort and slider 15x right/left Side stepping with green band above thighs 5 laps Attempted Spanish squat style with purple heavy band behind both knees with isometric hold 3x 10 sec hold (painful on side of knee) Isometric holds in partial squat (worked better, more comfortable) 3x 10 sec holds    PATIENT EDUCATION:  Education details: Initiated HEP Person educated: Patient Education method: Programmer, Multimedia, Facilities Manager, Verbal cues, and Handouts Education comprehension: verbalized understanding, returned demonstration, and verbal cues required  HOME EXERCISE PROGRAM: Access Code: [email or web address] URL: [email or web address] Date: 04/12/2023 Prepared by: Glade Pesa  Exercises - Long Sitting Quad Set  - 1 x daily - 7 x weekly - 3 sets - 10 reps - Supine Knee Extension Strengthening  - 1 x daily - 7 x weekly - 3 sets - 10 reps - Seated Long Arc Quad  - 1 x daily - 7 x weekly - 3 sets - 10 reps - Straight Leg Raise  - 1 x daily - 7 x weekly - 1 sets - 10 reps - Clamshell  - 1 x daily - 7 x weekly - 1 sets - 10 reps - Seated Hamstring Curl with Anchored Resistance  - 1 x daily - 7 x weekly - 1 sets - 10 reps - Standing Heel Raise with Support  - 1 x daily - 7 x weekly - 1 sets - 10 reps  ASSESSMENT:  CLINICAL IMPRESSION: Added resisted forward walking which is challenging to generate the power but pt denies pain.   Able to progress dead lifting resistance for posterior chain strengthening (gluteals and hamstrings). Verbal cues for forward gaze to keep neutral spine alignment.   She is able to perform ex's with minimal to no pain except for 6 inch step ups (pt requested  to attempt).  At the end of session she states,  I feel good.   OBJECTIVE IMPAIRMENTS: Abnormal gait, decreased balance, decreased knowledge of use of DME, decreased mobility, difficulty walking, decreased strength, increased edema, increased fascial restrictions, increased muscle spasms, impaired flexibility, postural dysfunction, obesity, and pain.   ACTIVITY LIMITATIONS: lifting, bending, sitting, standing, squatting, sleeping, stairs, transfers, bed mobility, bathing, toileting, dressing, and caring for others  PARTICIPATION LIMITATIONS: meal prep, cleaning, laundry, driving, shopping, community activity, yard work, and church  PERSONAL FACTORS: Fitness, Time since onset of injury/illness/exacerbation, and 1-2 comorbidities: anxiety and obesity  are also affecting patient's functional outcome.   REHAB POTENTIAL: Fair due to possible mechanical meniscus injury or joint deterioration  CLINICAL DECISION MAKING: Evolving/moderate complexity  EVALUATION COMPLEXITY: Moderate   GOALS: Goals reviewed with patient? Yes  SHORT TERM GOALS: Target date: 05/04/2023   Patient will be independent with initial HEP  Baseline: Goal status: MET on 05/03/2023  2.  Pain report to be no greater than 4/10  Baseline:  Goal status: Ongoing   LONG TERM GOALS: Target date: 07/27/2023   Patient to be independent with advanced HEP  Baseline:  Goal status: Ongoing  2.  Patient to report pain no greater than 2/10  Baseline:  Goal status: INITIAL  3.  Patient to be able to bend, stoop and squat with pain no greater than 2/10  Baseline:  Goal status: INITIAL  4.  Patient to be able to ascend and descend steps without pain or no greater than  2/10  Baseline:  Goal status: Ongoing  5.  Patient to be able to sleep through the night  Baseline:  Goal status: Ongoing  6.  Patient to report 85% improvement in overall symptoms  Baseline:  Goal status: Ongoing 7.  Six minute walk test 1200 feet indicating improved gait speed and strength.  new  8. Able to rise sit to stand from a standard chair with minimal knee pain (5x STS <17 sec) new   PLAN:  PT FREQUENCY: 1-2x/week  PT DURATION: 8 weeks  PLANNED INTERVENTIONS: 97110-Therapeutic exercises, 97530- Therapeutic activity, [id number]- Neuromuscular re-education, 97535- Self Care, [id number]- Manual therapy, [id number]- Gait training, [id number]- Aquatic Therapy, [phone]- Splinting, 97014- Electrical stimulation (unattended), [id number]- Electrical stimulation (manual), 97016- Vasopneumatic device, [id number]- Ultrasound, [id number]- Ionotophoresis 4mg /ml Dexamethasone, Patient/Family education, Balance training, Stair training, Taping, Dry Needling, Joint mobilization, Scar mobilization, Compression bandaging, Vestibular training, Visual/preceptual remediation/compensation, Cryotherapy, and Moist heat  PLAN FOR NEXT SESSION:  2 or 4 inch step ups;  dead lifts; leg press; quad, HS, glute strengthening in unloaded positions and partial load   Glade Pesa, PT 06/28/23 1:54 PM Phone: [phone] Fax: [phone]

## 2023-06-30 ENCOUNTER — Encounter: Payer: Self-pay | Admitting: Rehabilitative and Restorative Service Providers"

## 2023-06-30 ENCOUNTER — Ambulatory Visit: Payer: PPO | Admitting: Rehabilitative and Restorative Service Providers"

## 2023-06-30 DIAGNOSIS — M25661 Stiffness of right knee, not elsewhere classified: Secondary | ICD-10-CM

## 2023-06-30 DIAGNOSIS — M25561 Pain in right knee: Secondary | ICD-10-CM | POA: Diagnosis not present

## 2023-06-30 DIAGNOSIS — M6281 Muscle weakness (generalized): Secondary | ICD-10-CM

## 2023-06-30 DIAGNOSIS — G8929 Other chronic pain: Secondary | ICD-10-CM

## 2023-06-30 NOTE — Therapy (Signed)
OUTPATIENT PHYSICAL THERAPY TREATMENT NOTE  Patient Name: Jean Smith MRN: [id number] DOB:August 29, 1950, 73 y.o., female Today's Date: 06/30/2023     END OF SESSION:  PT End of Session - 06/30/23 1152     Visit Number 22    Date for PT Re-Evaluation 07/27/23    Authorization Type [employer]    Progress Note Due on Visit 30    PT Start Time 1150    PT Stop Time 1230    PT Time Calculation (min) 40 min    Activity Tolerance Patient tolerated treatment well    Behavior During Therapy [facility] for tasks assessed/performed             Past Medical History:  Diagnosis Date   Allergy    Anxiety    Aortic regurgitation    moderate by echo 10/2020   Arthritis    Cancer (HCC)    skin   Cataract    bilateral - MD is just watching    Diverticulosis    Fuchs' endothelial dystrophy    follows with optho regularly    Gestational diabetes    Heart murmur    MVP    History of depression    HSV-2 infection    Hyperlipidemia    Hyperplastic colon polyp    Mitral valve prolapse    mild to moderate MR by echo 10/2020   PVC's (premature ventricular contractions)    intol of BB, sxc palpitations r/t stress   RVOT ventricular tachycardia/PVCs    EP eval 01/2013 for freq PVCs   Past Surgical History:  Procedure Laterality Date   CESAREAN SECTION     x 3   COLONOSCOPY     KNEE SURGERY Right    MANDIBLE SURGERY     right side in front of ear   MOHS SURGERY  2021   POLYPECTOMY     WISDOM TOOTH EXTRACTION     Patient Active Problem List   Diagnosis Date Noted   AC (acromioclavicular) arthritis 05/28/2022   Mitral valve prolapse    Aortic regurgitation    NSVT (nonsustained ventricular tachycardia) (HCC) 09/25/2020   Diabetes mellitus type 2 with complications (HCC) 08/27/2020   Chronic pain of both shoulders 05/05/2018   Other fatigue 01/02/2018   Allergic rhinitis 01/02/2018   Routine general medical examination at a health care facility 09/01/2015    Hyperlipidemia associated with type 2 diabetes mellitus (HCC) 09/01/2015   Varicose vein 08/27/2014   Primary localized osteoarthrosis, lower leg 10/30/2013   RVOT ventricular tachycardia/PVCs 01/31/2013   Abnormal stress echo 12/11/2012    PCP: Myrlene Broker, MD  REFERRING PROVIDER: Judi Saa, DO  REFERRING DIAG: [phone] (ICD-10-CM) - Chronic pain of right knee  THERAPY DIAG:  Chronic pain of right knee  Stiffness of right knee, not elsewhere classified  Muscle weakness (generalized)  Rationale for Evaluation and Treatment: Rehabilitation  ONSET DATE: 03/31/2023  SUBJECTIVE:   SUBJECTIVE STATEMENT: Patient reports that she started having some pain with her hernia, she is unsure if it is due to the Nustep or the Dead Lifts.  Continues to reports pain of 3/10.   PERTINENT HISTORY: Knee scope approx 15 years ago (right knee) PAIN:   Are you having pain? Yes: NPRS scale: 3/10 Pain location: bil knees right > left Pain description: aching, sharp Aggravating factors: walking, standing, bending,stooping, squatting Relieving factors: rest, meds  PRECAUTIONS: Fall  RED FLAGS: None   WEIGHT BEARING RESTRICTIONS: No  FALLS:  Has  patient fallen in last 6 months? No  LIVING ENVIRONMENT: Lives with: lives with their spouse Lives in: House/apartment Stairs: Yes: Internal: 12 steps; on right going up and External: 5 steps; on right going up Has following equipment at home: None  OCCUPATION: retired  PLOF: Independent, Independent with basic ADLs, Independent with household mobility without device, Independent with community mobility without device, Independent with homemaking with ambulation, Independent with gait, and Independent with transfers  PATIENT GOALS: to be able to walk and get up and down from a chair and do her routine daily activities without pain and without her knee giving way  NEXT MD VISIT: prn  OBJECTIVE:  Note: Objective  measures were completed at Evaluation unless otherwise noted.  DIAGNOSTIC FINDINGS:  05/30/22: RIGHT KNEE 3 VIEWS  COMPARISON:  Bilateral knee radiographs May 19th, 2015  FINDINGS: No acute fracture or dislocation. No joint effusion. Mild lateral compartment subchondral sclerosis and cystic changes with mild osteophytosis. No soft tissue abnormalities.  IMPRESSION: Mild lateral compartment osteoarthritis.  07/28/22 Left knee: Resulted by: Judi Saa, DO Performed: 07/28/22 1411 - 07/28/22 1411  Accession number: [id number] Resulting lab: [facility]  Narrative: Limited muscular skeletal ultrasound was performed and interpreted by Antoine Primas, M Limited ultrasound continues to show the patient does have some hypoechoic changes in the patellofemoral joint noted.  Some degenerative changes of the meniscus noted. Impression: Improvement in inflammation but still continue effusion    PATIENT SURVEYS:  Eval: FOTO 38, predicted 55 05/09/2023:  FOTO 49 12/18:  54 1/10: 55 taken out of FOTO  COGNITION: Overall cognitive status: Within functional limits for tasks assessed     SENSATION: WFL   POSTURE:  slight knee valgus bilaterally  PALPATION: Mod crepitus noted on seated open chain flexion/extension  LOWER EXTREMITY ROM:  WNL LOWER EXTREMITY MMT:  Generally 4/5 with exception of bilateral knee flexion 4-/5 and bilateral hip abd 4-/5 and bilateral hip ER 3+/5  LOWER EXTREMITY SPECIAL TESTS:  Knee special tests: Patellafemoral grind test: positive   FUNCTIONAL TESTS:  Eval: 5 times sit to stand: 24 sec Timed up and go (TUG): 14.5 sec  05/03/2023: 5 times sit to stand:  18.36 sec without UE use Timed up and go (TUG): 8.00 sec 3 minute walk test: 612 ft without increased pain with RPE of 3/10  12/18 5x 20.16 (painful secondary to recent fall and prolonged standing yesterday) 6 MWT 1140 3/10 knee pain, back pain  3 MWT 550 feet  06/24/23: 5x STS  painful: 18.84 no hands TUG 11 sec   GAIT: Distance walked: 50 feet Assistive device utilized: None Level of assistance: Complete Independence Comments: antalgic, slow, right LE externally rotated, guarded, short step length   TODAY'S TREATMENT:     DATE: 06/30/2023 Bike level 2 x 5 minutes with PT present to discuss status Seated hamstring stretch 2x20 sec bilat Hip Matrix 25#:  hip extension and hip abduction.  X5 each bilat Leg Press (seat at 7) 85# 3x10 Single Leg Press 40# 2x10 bilat FWD step ups with 4" step and UE assist x5 bilat 4 way resisted gait with 5# cable pulley x5 each direction Alt LE toe touch to 6" step without UE support x10 bilat   DATE: 06/28/2023 Bike 10 min while discussing status  Leg Press (seat at 7 upright seat) 80# 2x10 bil; single leg; 40# on right/left 20x Standing modified dead lifts to knee level holding pair of 7 pound weights 10x with verbal cues for forward gaze  for neutral spine while hip hinging    Bil calf raises 10x2 holding 7# weight Resisted backward walk with belt:  backward 10# 5x;  forwards 5x (very challenging) Step ups onto 3 inch cushion 7x right/left  Pt attempted 6 inch right step up but too painful 2x Nu-Step L 5 (green machine) 7 min (I didn't like this machine at first but I like it now) DATE: 06/24/2023 Bike 10 min while discussing status  FOTO 5x STS TUG Leg Press (seat at 7) 80# 2x10 bil; single leg; 40# on right/left 15x Staggered stance Bil calf raises 16x Dead lifting pair of 5# weights 12x  Resisted backward walk backward 10# 5x (cue to keep handles close to the body)  Nu-Step L 5 (green machine) 5 min (pt has pain initially, if I drive through my hip there's no pain)    PATIENT EDUCATION:  Education details: Initiated HEP Person educated: Patient Education method: Programmer, multimedia, Facilities manager, Verbal cues, and Handouts Education comprehension: verbalized understanding, returned demonstration, and verbal cues  required  HOME EXERCISE PROGRAM: Access Code: [email or web address] URL: [email or web address] Date: 04/12/2023 Prepared by: Lavinia Sharps  Exercises - Long Sitting Quad Set  - 1 x daily - 7 x weekly - 3 sets - 10 reps - Supine Knee Extension Strengthening  - 1 x daily - 7 x weekly - 3 sets - 10 reps - Seated Long Arc Quad  - 1 x daily - 7 x weekly - 3 sets - 10 reps - Straight Leg Raise  - 1 x daily - 7 x weekly - 1 sets - 10 reps - Clamshell  - 1 x daily - 7 x weekly - 1 sets - 10 reps - Seated Hamstring Curl with Anchored Resistance  - 1 x daily - 7 x weekly - 1 sets - 10 reps - Standing Heel Raise with Support  - 1 x daily - 7 x weekly - 1 sets - 10 reps  ASSESSMENT:  CLINICAL IMPRESSION: Ms Midyette presents to skilled PT reporting that she was having some increased pain with her hernia and is uncertain if it might be due to Nustep or modified dead lift, so did not perform those exercises today to assess if that would help her pain.  Patient was able to add in hip strengthening with Hip Matrix machine, but limited to 5 reps due to patient having some difficulty with task.  Patient did report that hip extension was easier than hip abduction.  Decreased resistance on resisted gait to facilitate improved posture with stepping with cuing for slower pace and patient reported that she could feel her LE muscles working.  Patient able to progress with standing balance with toe tapping exercise and was able to initiate step ups with 4" step with UE support today.  Patient continues to require skilled PT to progress towards goal related activities.  OBJECTIVE IMPAIRMENTS: Abnormal gait, decreased balance, decreased knowledge of use of DME, decreased mobility, difficulty walking, decreased strength, increased edema, increased fascial restrictions, increased muscle spasms, impaired flexibility, postural dysfunction, obesity, and pain.   ACTIVITY LIMITATIONS: lifting, bending, sitting, standing,  squatting, sleeping, stairs, transfers, bed mobility, bathing, toileting, dressing, and caring for others  PARTICIPATION LIMITATIONS: meal prep, cleaning, laundry, driving, shopping, community activity, yard work, and church  PERSONAL FACTORS: Fitness, Time since onset of injury/illness/exacerbation, and 1-2 comorbidities: anxiety and obesity  are also affecting patient's functional outcome.   REHAB POTENTIAL: Fair due to possible mechanical meniscus injury or joint deterioration  CLINICAL DECISION MAKING:  Evolving/moderate complexity  EVALUATION COMPLEXITY: Moderate   GOALS: Goals reviewed with patient? Yes  SHORT TERM GOALS: Target date: 05/04/2023   Patient will be independent with initial HEP  Baseline: Goal status: MET on 05/03/2023  2.  Pain report to be no greater than 4/10  Baseline:  Goal status: Met on 06/30/2023   LONG TERM GOALS: Target date: 07/27/2023   Patient to be independent with advanced HEP  Baseline:  Goal status: Ongoing  2.  Patient to report pain no greater than 2/10  Baseline:  Goal status: INITIAL  3.  Patient to be able to bend, stoop and squat with pain no greater than 2/10  Baseline:  Goal status: INITIAL  4.  Patient to be able to ascend and descend steps without pain or no greater than 2/10  Baseline:  Goal status: Ongoing  5.  Patient to be able to sleep through the night  Baseline:  Goal status: Ongoing  6.  Patient to report 85% improvement in overall symptoms  Baseline:  Goal status: Ongoing 7.  Six minute walk test 1200 feet indicating improved gait speed and strength.  new  8. Able to rise sit to stand from a standard chair with minimal knee pain (5x STS <17 sec) new   PLAN:  PT FREQUENCY: 1-2x/week  PT DURATION: 8 weeks  PLANNED INTERVENTIONS: 97110-Therapeutic exercises, 97530- Therapeutic activity, [id number]- Neuromuscular re-education, 97535- Self Care, [id number]- Manual therapy, [id number]- Gait training, [phone]- Aquatic  Therapy, [phone]- Splinting, 97014- Electrical stimulation (unattended), [phone]- Electrical stimulation (manual), 97016- Vasopneumatic device, [id number]- Ultrasound, 69629- Ionotophoresis 4mg /ml Dexamethasone, Patient/Family education, Balance training, Stair training, Taping, Dry Needling, Joint mobilization, Scar mobilization, Compression bandaging, Vestibular training, Visual/preceptual remediation/compensation, Cryotherapy, and Moist heat  PLAN FOR NEXT SESSION:  2 or 4 inch step ups;  dead lifts; leg press; quad, HS, glute strengthening in unloaded positions and partial load   Reather Laurence, PT, DPT 06/30/23, 1:10 PM  [facility] [address], [address] [place], [place] [address] Phone # [phone] Fax [phone]

## 2023-07-04 ENCOUNTER — Encounter: Payer: PPO | Admitting: Rehabilitative and Restorative Service Providers"

## 2023-07-05 ENCOUNTER — Ambulatory Visit: Payer: PPO | Admitting: Physical Therapy

## 2023-07-05 DIAGNOSIS — M6281 Muscle weakness (generalized): Secondary | ICD-10-CM

## 2023-07-05 DIAGNOSIS — G8929 Other chronic pain: Secondary | ICD-10-CM

## 2023-07-05 DIAGNOSIS — M25561 Pain in right knee: Secondary | ICD-10-CM | POA: Diagnosis not present

## 2023-07-05 DIAGNOSIS — M25661 Stiffness of right knee, not elsewhere classified: Secondary | ICD-10-CM

## 2023-07-05 NOTE — Therapy (Signed)
OUTPATIENT PHYSICAL THERAPY TREATMENT NOTE  Patient Name: Jean Smith MRN: [id number] DOB:August 19, 1950, 73 y.o., female Today's Date: 07/05/2023     END OF SESSION:  PT End of Session - 07/05/23 1115     Visit Number 23    Date for PT Re-Evaluation 07/27/23    Authorization Type [employer]    Progress Note Due on Visit 30    PT Start Time 1113   late   PT Stop Time 1148    PT Time Calculation (min) 35 min    Activity Tolerance Patient tolerated treatment well             Past Medical History:  Diagnosis Date   Allergy    Anxiety    Aortic regurgitation    moderate by echo 10/2020   Arthritis    Cancer (HCC)    skin   Cataract    bilateral - MD is just watching    Diverticulosis    Fuchs' endothelial dystrophy    follows with optho regularly    Gestational diabetes    Heart murmur    MVP    History of depression    HSV-2 infection    Hyperlipidemia    Hyperplastic colon polyp    Mitral valve prolapse    mild to moderate MR by echo 10/2020   PVC's (premature ventricular contractions)    intol of BB, sxc palpitations r/t stress   RVOT ventricular tachycardia/PVCs    EP eval 01/2013 for freq PVCs   Past Surgical History:  Procedure Laterality Date   CESAREAN SECTION     x 3   COLONOSCOPY     KNEE SURGERY Right    MANDIBLE SURGERY     right side in front of ear   MOHS SURGERY  2021   POLYPECTOMY     WISDOM TOOTH EXTRACTION     Patient Active Problem List   Diagnosis Date Noted   AC (acromioclavicular) arthritis 05/28/2022   Mitral valve prolapse    Aortic regurgitation    NSVT (nonsustained ventricular tachycardia) (HCC) 09/25/2020   Diabetes mellitus type 2 with complications (HCC) 08/27/2020   Chronic pain of both shoulders 05/05/2018   Other fatigue 01/02/2018   Allergic rhinitis 01/02/2018   Routine general medical examination at a health care facility 09/01/2015   Hyperlipidemia associated with type 2 diabetes mellitus (HCC)  09/01/2015   Varicose vein 08/27/2014   Primary localized osteoarthrosis, lower leg 10/30/2013   RVOT ventricular tachycardia/PVCs 01/31/2013   Abnormal stress echo 12/11/2012    PCP: Myrlene Broker, MD  REFERRING PROVIDER: Judi Saa, DO  REFERRING DIAG: [phone] (ICD-10-CM) - Chronic pain of right knee  THERAPY DIAG:  Chronic pain of right knee  Stiffness of right knee, not elsewhere classified  Muscle weakness (generalized)  Rationale for Evaluation and Treatment: Rehabilitation  ONSET DATE: 03/31/2023  SUBJECTIVE:   SUBJECTIVE STATEMENT: I feel ok.  Didn't hurt Friday or Saturday.  Yesterday some aches and pains where my kidneys are.  My hernia didn't hurt after last time but I don't know if it's the Nu-Step or other stuff I'm doing.   PERTINENT HISTORY: Knee scope approx 15 years ago (right knee) PAIN:   Are you having pain? Yes: NPRS scale: 3/10 Pain location: bil knees right > left Pain description: aching, sharp Aggravating factors: walking, standing, bending,stooping, squatting Relieving factors: rest, meds  PRECAUTIONS: Fall  RED FLAGS: None   WEIGHT BEARING RESTRICTIONS: No  FALLS:  Has  patient fallen in last 6 months? No  LIVING ENVIRONMENT: Lives with: lives with their spouse Lives in: House/apartment Stairs: Yes: Internal: 12 steps; on right going up and External: 5 steps; on right going up Has following equipment at home: None  OCCUPATION: retired  PLOF: Independent, Independent with basic ADLs, Independent with household mobility without device, Independent with community mobility without device, Independent with homemaking with ambulation, Independent with gait, and Independent with transfers  PATIENT GOALS: to be able to walk and get up and down from a chair and do her routine daily activities without pain and without her knee giving way  NEXT MD VISIT: prn  OBJECTIVE:  Note: Objective measures were completed at  Evaluation unless otherwise noted.  DIAGNOSTIC FINDINGS:  05/30/22: RIGHT KNEE 3 VIEWS  COMPARISON:  Bilateral knee radiographs May 19th, 2015  FINDINGS: No acute fracture or dislocation. No joint effusion. Mild lateral compartment subchondral sclerosis and cystic changes with mild osteophytosis. No soft tissue abnormalities.  IMPRESSION: Mild lateral compartment osteoarthritis.  07/28/22 Left knee: Resulted by: Judi Saa, DO Performed: 07/28/22 1411 - 07/28/22 1411  Accession number: [id number] Resulting lab:  RADIOLOGY  Narrative: Limited muscular skeletal ultrasound was performed and interpreted by Antoine Primas, M Limited ultrasound continues to show the patient does have some hypoechoic changes in the patellofemoral joint noted.  Some degenerative changes of the meniscus noted. Impression: Improvement in inflammation but still continue effusion    PATIENT SURVEYS:  Eval: FOTO 38, predicted 55 05/09/2023:  FOTO 49 12/18:  54 1/10: 55 taken out of FOTO  COGNITION: Overall cognitive status: Within functional limits for tasks assessed     SENSATION: WFL   POSTURE:  slight knee valgus bilaterally  PALPATION: Mod crepitus noted on seated open chain flexion/extension  LOWER EXTREMITY ROM:  WNL LOWER EXTREMITY MMT:  Generally 4/5 with exception of bilateral knee flexion 4-/5 and bilateral hip abd 4-/5 and bilateral hip ER 3+/5  LOWER EXTREMITY SPECIAL TESTS:  Knee special tests: Patellafemoral grind test: positive   FUNCTIONAL TESTS:  Eval: 5 times sit to stand: 24 sec Timed up and go (TUG): 14.5 sec  05/03/2023: 5 times sit to stand:  18.36 sec without UE use Timed up and go (TUG): 8.00 sec 3 minute walk test: 612 ft without increased pain with RPE of 3/10  12/18 5x 20.16 (painful secondary to recent fall and prolonged standing yesterday) 6 MWT 1140 3/10 knee pain, back pain  3 MWT 550 feet  06/24/23: 5x STS painful: 18.84 no hands TUG  11 sec   GAIT: Distance walked: 50 feet Assistive device utilized: None Level of assistance: Complete Independence Comments: antalgic, slow, right LE externally rotated, guarded, short step length   TODAY'S TREATMENT:    DATE: 07/05/2023 Bike level 2 x 7 minutes with PT present to discuss status Hip Matrix 25#:  hip extension and hip abduction.  X5 each bilat Leg Press (seat at 7) 85# 2x10 Single Leg Press 45# left 15x; 40# right 15x FWD step ups with 4" step and UE assist x5 bilat 4 way resisted gait with 5# cable pulley x5 each direction Alt LE toe touch to 6" step without UE support x10 bilat Nu-Step L 2 (green machine) 4 min   DATE: 06/30/2023 Bike level 2 x 5 minutes with PT present to discuss status Seated hamstring stretch 2x20 sec bilat Hip Matrix 25#:  hip extension and hip abduction.  X5 each bilat Leg Press (seat at 7) 85# 3x10 Single Leg  Press 40# 2x10 bilat FWD step ups with 4" step and UE assist x5 bilat 4 way resisted gait with 5# cable pulley x5 each direction Alt LE toe touch to 6" step without UE support x10 bilat   DATE: 06/28/2023 Bike 10 min while discussing status  Leg Press (seat at 7 upright seat) 80# 2x10 bil; single leg; 40# on right/left 20x Standing modified dead lifts to knee level holding pair of 7 pound weights 10x with verbal cues for forward gaze for neutral spine while hip hinging    Bil calf raises 10x2 holding 7# weight Resisted backward walk with belt:  backward 10# 5x;  forwards 5x (very challenging) Step ups onto 3 inch cushion 7x right/left  Pt attempted 6 inch right step up but too painful 2x Nu-Step L 5 (green machine) 7 min (I didn't like this machine at first but I like it now) DATE: 06/24/2023 Bike 10 min while discussing status  FOTO 5x STS TUG Leg Press (seat at 7) 80# 2x10 bil; single leg; 40# on right/left 15x Staggered stance Bil calf raises 16x Dead lifting pair of 5# weights 12x  Resisted backward walk backward 10# 5x  (cue to keep handles close to the body)  Nu-Step L 5 (green machine) 5 min (pt has pain initially, if I drive through my hip there's no pain)    PATIENT EDUCATION:  Education details: Initiated HEP Person educated: Patient Education method: Programmer, multimedia, Facilities manager, Verbal cues, and Handouts Education comprehension: verbalized understanding, returned demonstration, and verbal cues required  HOME EXERCISE PROGRAM: Access Code: [email or web address] URL: [email or web address] Date: 04/12/2023 Prepared by: Lavinia Sharps  Exercises - Long Sitting Quad Set  - 1 x daily - 7 x weekly - 3 sets - 10 reps - Supine Knee Extension Strengthening  - 1 x daily - 7 x weekly - 3 sets - 10 reps - Seated Long Arc Quad  - 1 x daily - 7 x weekly - 3 sets - 10 reps - Straight Leg Raise  - 1 x daily - 7 x weekly - 1 sets - 10 reps - Clamshell  - 1 x daily - 7 x weekly - 1 sets - 10 reps - Seated Hamstring Curl with Anchored Resistance  - 1 x daily - 7 x weekly - 1 sets - 10 reps - Standing Heel Raise with Support  - 1 x daily - 7 x weekly - 1 sets - 10 reps  ASSESSMENT:  CLINICAL IMPRESSION: Verbal cues to avoid holding breath which could be the impetus for hernia discomfort.  Able to increase resistance on left single leg press.  Therapist continually monitoring response and modifying treatment accordingly.  End of session she reports she feels good.     OBJECTIVE IMPAIRMENTS: Abnormal gait, decreased balance, decreased knowledge of use of DME, decreased mobility, difficulty walking, decreased strength, increased edema, increased fascial restrictions, increased muscle spasms, impaired flexibility, postural dysfunction, obesity, and pain.   ACTIVITY LIMITATIONS: lifting, bending, sitting, standing, squatting, sleeping, stairs, transfers, bed mobility, bathing, toileting, dressing, and caring for others  PARTICIPATION LIMITATIONS: meal prep, cleaning, laundry, driving, shopping, community activity,  yard work, and church  PERSONAL FACTORS: Fitness, Time since onset of injury/illness/exacerbation, and 1-2 comorbidities: anxiety and obesity  are also affecting patient's functional outcome.   REHAB POTENTIAL: Fair due to possible mechanical meniscus injury or joint deterioration  CLINICAL DECISION MAKING: Evolving/moderate complexity  EVALUATION COMPLEXITY: Moderate   GOALS: Goals reviewed with patient? Yes  SHORT TERM GOALS: Target date: 05/04/2023  Patient will be independent with initial HEP  Baseline: Goal status: MET on 05/03/2023  2.  Pain report to be no greater than 4/10  Baseline:  Goal status: Met on 06/30/2023   LONG TERM GOALS: Target date: 07/27/2023   Patient to be independent with advanced HEP  Baseline:  Goal status: Ongoing  2.  Patient to report pain no greater than 2/10  Baseline:  Goal status: Ongoing  3.  Patient to be able to bend, stoop and squat with pain no greater than 2/10  Baseline:  Goal status: Ongoing  4.  Patient to be able to ascend and descend steps without pain or no greater than 2/10  Baseline:  Goal status: Ongoing  5.  Patient to be able to sleep through the night  Baseline:  Goal status: met 1/21  6.  Patient to report 85% improvement in overall symptoms  Baseline:  Goal status: Ongoing 7.  Six minute walk test 1200 feet indicating improved gait speed and strength.  new  8. Able to rise sit to stand from a standard chair with minimal knee pain (5x STS <17 sec) new   PLAN:  PT FREQUENCY: 1-2x/week  PT DURATION: 8 weeks  PLANNED INTERVENTIONS: 97110-Therapeutic exercises, 97530- Therapeutic activity, [id number]- Neuromuscular re-education, 97535- Self Care, [id number]- Manual therapy, [id number]- Gait training, [phone]- Aquatic Therapy, [id number]- Splinting, 97014- Electrical stimulation (unattended), [id number]- Electrical stimulation (manual), 97016- Vasopneumatic device, [id number]- Ultrasound, [id number]- Ionotophoresis 4mg /ml Dexamethasone,  Patient/Family education, Balance training, Stair training, Taping, Dry Needling, Joint mobilization, Scar mobilization, Compression bandaging, Vestibular training, Visual/preceptual remediation/compensation, Cryotherapy, and Moist heat  PLAN FOR NEXT SESSION:  2 or 4 inch step ups; increase resistance with backward cable walking; leg press; quad, HS, glute strengthening in unloaded positions and partial load  Lavinia Sharps, PT 07/05/23 12:17 PM Phone: [phone] Fax: [phone]

## 2023-07-07 ENCOUNTER — Ambulatory Visit: Payer: PPO | Admitting: Physical Therapy

## 2023-07-07 DIAGNOSIS — M25661 Stiffness of right knee, not elsewhere classified: Secondary | ICD-10-CM

## 2023-07-07 DIAGNOSIS — M25561 Pain in right knee: Secondary | ICD-10-CM | POA: Diagnosis not present

## 2023-07-07 DIAGNOSIS — M6281 Muscle weakness (generalized): Secondary | ICD-10-CM

## 2023-07-07 DIAGNOSIS — G8929 Other chronic pain: Secondary | ICD-10-CM

## 2023-07-07 NOTE — Therapy (Signed)
OUTPATIENT PHYSICAL THERAPY TREATMENT NOTE  Patient Name: Jean Smith MRN: [id number] DOB:Jun 19, 1950, 73 y.o., female Today's Date: 07/07/2023     END OF SESSION:  PT End of Session - 07/07/23 1020     Visit Number 24    Date for PT Re-Evaluation 07/27/23    Authorization Type [employer]    Progress Note Due on Visit 30    PT Start Time 1018    PT Stop Time 1058    PT Time Calculation (min) 40 min    Activity Tolerance Patient tolerated treatment well             Past Medical History:  Diagnosis Date   Allergy    Anxiety    Aortic regurgitation    moderate by echo 10/2020   Arthritis    Cancer (HCC)    skin   Cataract    bilateral - MD is just watching    Diverticulosis    Fuchs' endothelial dystrophy    follows with optho regularly    Gestational diabetes    Heart murmur    MVP    History of depression    HSV-2 infection    Hyperlipidemia    Hyperplastic colon polyp    Mitral valve prolapse    mild to moderate MR by echo 10/2020   PVC's (premature ventricular contractions)    intol of BB, sxc palpitations r/t stress   RVOT ventricular tachycardia/PVCs    EP eval 01/2013 for freq PVCs   Past Surgical History:  Procedure Laterality Date   CESAREAN SECTION     x 3   COLONOSCOPY     KNEE SURGERY Right    MANDIBLE SURGERY     right side in front of ear   MOHS SURGERY  2021   POLYPECTOMY     WISDOM TOOTH EXTRACTION     Patient Active Problem List   Diagnosis Date Noted   AC (acromioclavicular) arthritis 05/28/2022   Mitral valve prolapse    Aortic regurgitation    NSVT (nonsustained ventricular tachycardia) (HCC) 09/25/2020   Diabetes mellitus type 2 with complications (HCC) 08/27/2020   Chronic pain of both shoulders 05/05/2018   Other fatigue 01/02/2018   Allergic rhinitis 01/02/2018   Routine general medical examination at a health care facility 09/01/2015   Hyperlipidemia associated with type 2 diabetes mellitus (HCC)  09/01/2015   Varicose vein 08/27/2014   Primary localized osteoarthrosis, lower leg 10/30/2013   RVOT ventricular tachycardia/PVCs 01/31/2013   Abnormal stress echo 12/11/2012    PCP: Myrlene Broker, MD  REFERRING PROVIDER: Judi Saa, DO  REFERRING DIAG: [id number] (ICD-10-CM) - Chronic pain of right knee  THERAPY DIAG:  Chronic pain of right knee  Stiffness of right knee, not elsewhere classified  Muscle weakness (generalized)  Rationale for Evaluation and Treatment: Rehabilitation  ONSET DATE: 03/31/2023  SUBJECTIVE:   SUBJECTIVE STATEMENT: The Nu-step was fine on my hernia. My legs are swollen today, I don't know why.  I was thinking of getting a recliner instead of sitting with my legs straight on an ottoman. It's like my knees are too straight.  I did do the bike in the garage the other day.   PERTINENT HISTORY: Knee scope approx 15 years ago (right knee) PAIN:   Are you having pain? Yes: NPRS scale: 2/10 Pain location: bil knees right > left Pain description: aching, sharp Aggravating factors: walking, standing, bending,stooping, squatting Relieving factors: rest, meds  PRECAUTIONS: Fall  RED FLAGS:  None   WEIGHT BEARING RESTRICTIONS: No  FALLS:  Has patient fallen in last 6 months? No  LIVING ENVIRONMENT: Lives with: lives with their spouse Lives in: House/apartment Stairs: Yes: Internal: 12 steps; on right going up and External: 5 steps; on right going up Has following equipment at home: None  OCCUPATION: retired  PLOF: Independent, Independent with basic ADLs, Independent with household mobility without device, Independent with community mobility without device, Independent with homemaking with ambulation, Independent with gait, and Independent with transfers  PATIENT GOALS: to be able to walk and get up and down from a chair and do her routine daily activities without pain and without her knee giving way  NEXT MD VISIT:  prn  OBJECTIVE:  Note: Objective measures were completed at Evaluation unless otherwise noted.  DIAGNOSTIC FINDINGS:  05/30/22: RIGHT KNEE 3 VIEWS  COMPARISON:  Bilateral knee radiographs May 19th, 2015  FINDINGS: No acute fracture or dislocation. No joint effusion. Mild lateral compartment subchondral sclerosis and cystic changes with mild osteophytosis. No soft tissue abnormalities.  IMPRESSION: Mild lateral compartment osteoarthritis.  07/28/22 Left knee: Resulted by: Judi Saa, DO Performed: 07/28/22 1411 - 07/28/22 1411  Accession number: [id number] Resulting lab: [facility]  Narrative: Limited muscular skeletal ultrasound was performed and interpreted by Antoine Primas, M Limited ultrasound continues to show the patient does have some hypoechoic changes in the patellofemoral joint noted.  Some degenerative changes of the meniscus noted. Impression: Improvement in inflammation but still continue effusion    PATIENT SURVEYS:  Eval: FOTO 38, predicted 55 05/09/2023:  FOTO 49 12/18:  54 1/10: 55 taken out of FOTO  COGNITION: Overall cognitive status: Within functional limits for tasks assessed     SENSATION: WFL   POSTURE:  slight knee valgus bilaterally  PALPATION: Mod crepitus noted on seated open chain flexion/extension  LOWER EXTREMITY ROM:  WNL LOWER EXTREMITY MMT:  Generally 4/5 with exception of bilateral knee flexion 4-/5 and bilateral hip abd 4-/5 and bilateral hip ER 3+/5  LOWER EXTREMITY SPECIAL TESTS:  Knee special tests: Patellafemoral grind test: positive   FUNCTIONAL TESTS:  Eval: 5 times sit to stand: 24 sec Timed up and go (TUG): 14.5 sec  05/03/2023: 5 times sit to stand:  18.36 sec without UE use Timed up and go (TUG): 8.00 sec 3 minute walk test: 612 ft without increased pain with RPE of 3/10  12/18 5x 20.16 (painful secondary to recent fall and prolonged standing yesterday) 6 MWT 1140 3/10 knee pain, back pain   3 MWT 550 feet  06/24/23: 5x STS painful: 18.84 no hands TUG 11 sec   GAIT: Distance walked: 50 feet Assistive device utilized: None Level of assistance: Complete Independence Comments: antalgic, slow, right LE externally rotated, guarded, short step length   TODAY'S TREATMENT:    DATE: 07/07/2023 Bike level 2 x 7 minutes with PT present to discuss status Leg Press upright (seat at 7) 85# 2x10 Single Leg Press 45# left 15x; 45# right 10x (increased weight, fewer reps) FWD step ups with 4" step and UE assist x7 bilat Lateral 4 inch step up but needs railing assist 5x right 4 way resisted gait with 10#  backwards cable pulley x5; other directions 5# each direction 5x (increase to 10# on forward direction next time) Alt LE toe touch to 6" step without UE support x 30 sec no UE use Nu-Step seat 7, arms 8 (change these settings next time) L 2 (green machine) 6 min   DATE:  07/05/2023 Bike level 2 x 7 minutes with PT present to discuss status Hip Matrix 25#:  hip extension and hip abduction.  X5 each bilat Leg Press (seat at 7) 85# 2x10 Single Leg Press 45# left 15x; 40# right 15x FWD step ups with 4" step and UE assist x5 bilat 4 way resisted gait with 5# cable pulley x5 each direction Alt LE toe touch to 6" step without UE support x10 bilat Nu-Step L 2 (green machine) 4 min   DATE: 06/30/2023 Bike level 2 x 5 minutes with PT present to discuss status Seated hamstring stretch 2x20 sec bilat Hip Matrix 25#:  hip extension and hip abduction.  X5 each bilat Leg Press (seat at 7) 85# 3x10 Single Leg Press 40# 2x10 bilat FWD step ups with 4" step and UE assist x5 bilat 4 way resisted gait with 5# cable pulley x5 each direction Alt LE toe touch to 6" step without UE support x10 bilat   DATE: 06/28/2023 Bike 10 min while discussing status  Leg Press (seat at 7 upright seat) 80# 2x10 bil; single leg; 40# on right/left 20x Standing modified dead lifts to knee level holding pair of 7  pound weights 10x with verbal cues for forward gaze for neutral spine while hip hinging    Bil calf raises 10x2 holding 7# weight Resisted backward walk with belt:  backward 10# 5x;  forwards 5x (very challenging) Step ups onto 3 inch cushion 7x right/left  Pt attempted 6 inch right step up but too painful 2x Nu-Step L 5 (green machine) 7 min (I didn't like this machine at first but I like it now)    PATIENT EDUCATION:  Education details: Initiated HEP Person educated: Patient Education method: Programmer, multimedia, Facilities manager, Verbal cues, and Handouts Education comprehension: verbalized understanding, returned demonstration, and verbal cues required  HOME EXERCISE PROGRAM: Access Code: [email or web address] URL: [email or web address] Date: 04/12/2023 Prepared by: Lavinia Sharps  Exercises - Long Sitting Quad Set  - 1 x daily - 7 x weekly - 3 sets - 10 reps - Supine Knee Extension Strengthening  - 1 x daily - 7 x weekly - 3 sets - 10 reps - Seated Long Arc Quad  - 1 x daily - 7 x weekly - 3 sets - 10 reps - Straight Leg Raise  - 1 x daily - 7 x weekly - 1 sets - 10 reps - Clamshell  - 1 x daily - 7 x weekly - 1 sets - 10 reps - Seated Hamstring Curl with Anchored Resistance  - 1 x daily - 7 x weekly - 1 sets - 10 reps - Standing Heel Raise with Support  - 1 x daily - 7 x weekly - 1 sets - 10 reps  ASSESSMENT:  CLINICAL IMPRESSION:  Taetum denies any adverse response (with her hernia) with adding in the Nu-Step the last 2 visits. Her right knee is more painful than the left but able to do limited amounts of 4 inch step ups and an increase in right leg press weight.  Much improved postural and patellofemoral alignment.  Therapist closely monitoring repetition count to avoid "overdoing it".     OBJECTIVE IMPAIRMENTS: Abnormal gait, decreased balance, decreased knowledge of use of DME, decreased mobility, difficulty walking, decreased strength, increased edema, increased fascial  restrictions, increased muscle spasms, impaired flexibility, postural dysfunction, obesity, and pain.   ACTIVITY LIMITATIONS: lifting, bending, sitting, standing, squatting, sleeping, stairs, transfers, bed mobility, bathing, toileting, dressing, and caring for others  PARTICIPATION LIMITATIONS: meal  prep, cleaning, laundry, driving, shopping, community activity, yard work, and church  PERSONAL FACTORS: Fitness, Time since onset of injury/illness/exacerbation, and 1-2 comorbidities: anxiety and obesity  are also affecting patient's functional outcome.   REHAB POTENTIAL: Fair due to possible mechanical meniscus injury or joint deterioration  CLINICAL DECISION MAKING: Evolving/moderate complexity  EVALUATION COMPLEXITY: Moderate   GOALS: Goals reviewed with patient? Yes  SHORT TERM GOALS: Target date: 05/04/2023   Patient will be independent with initial HEP  Baseline: Goal status: MET on 05/03/2023  2.  Pain report to be no greater than 4/10  Baseline:  Goal status: Met on 06/30/2023   LONG TERM GOALS: Target date: 07/27/2023   Patient to be independent with advanced HEP  Baseline:  Goal status: Ongoing  2.  Patient to report pain no greater than 2/10  Baseline:  Goal status: Ongoing  3.  Patient to be able to bend, stoop and squat with pain no greater than 2/10  Baseline:  Goal status: Ongoing  4.  Patient to be able to ascend and descend steps without pain or no greater than 2/10  Baseline:  Goal status: Ongoing  5.  Patient to be able to sleep through the night  Baseline:  Goal status: met 1/21  6.  Patient to report 85% improvement in overall symptoms  Baseline:  Goal status: Ongoing 7.  Six minute walk test 1200 feet indicating improved gait speed and strength.  new  8. Able to rise sit to stand from a standard chair with minimal knee pain (5x STS <17 sec) new   PLAN:  PT FREQUENCY: 1-2x/week  PT DURATION: 8 weeks  PLANNED INTERVENTIONS:  97110-Therapeutic exercises, 97530- Therapeutic activity, [id number]- Neuromuscular re-education, 97535- Self Care, 16109- Manual therapy, [id number]- Gait training, [phone]- Aquatic Therapy, [phone]- Splinting, 97014- Electrical stimulation (unattended), [id number]- Electrical stimulation (manual), 97016- Vasopneumatic device, [id number]- Ultrasound, [id number]- Ionotophoresis 4mg /ml Dexamethasone, Patient/Family education, Balance training, Stair training, Taping, Dry Needling, Joint mobilization, Scar mobilization, Compression bandaging, Vestibular training, Visual/preceptual remediation/compensation, Cryotherapy, and Moist heat  PLAN FOR NEXT SESSION:  2 or 4 inch step ups; increase resistance with forward cable walking; leg press; quad, HS, glute strengthening in unloaded positions and partial load Lavinia Sharps, PT 07/07/23 11:43 AM Phone: [phone] Fax: [phone]

## 2023-07-09 ENCOUNTER — Other Ambulatory Visit: Payer: Self-pay | Admitting: Internal Medicine

## 2023-07-11 ENCOUNTER — Encounter: Payer: Self-pay | Admitting: Rehabilitative and Restorative Service Providers"

## 2023-07-11 ENCOUNTER — Ambulatory Visit: Payer: PPO | Admitting: Rehabilitative and Restorative Service Providers"

## 2023-07-11 DIAGNOSIS — M25661 Stiffness of right knee, not elsewhere classified: Secondary | ICD-10-CM

## 2023-07-11 DIAGNOSIS — G8929 Other chronic pain: Secondary | ICD-10-CM

## 2023-07-11 DIAGNOSIS — M25561 Pain in right knee: Secondary | ICD-10-CM | POA: Diagnosis not present

## 2023-07-11 DIAGNOSIS — M6281 Muscle weakness (generalized): Secondary | ICD-10-CM

## 2023-07-11 NOTE — Therapy (Signed)
OUTPATIENT PHYSICAL THERAPY TREATMENT NOTE  Patient Name: NEVIA HENKIN MRN: [id number] DOB:03-23-1951, 73 y.o., female Today's Date: 07/11/2023     END OF SESSION:  PT End of Session - 07/11/23 1151     Visit Number 25    Date for PT Re-Evaluation 07/27/23    Authorization Type [employer]    Progress Note Due on Visit 30    PT Start Time 1150    PT Stop Time 1228    PT Time Calculation (min) 38 min    Activity Tolerance Patient tolerated treatment well    Behavior During Therapy [facility] for tasks assessed/performed             Past Medical History:  Diagnosis Date   Allergy    Anxiety    Aortic regurgitation    moderate by echo 10/2020   Arthritis    Cancer (HCC)    skin   Cataract    bilateral - MD is just watching    Diverticulosis    Fuchs' endothelial dystrophy    follows with optho regularly    Gestational diabetes    Heart murmur    MVP    History of depression    HSV-2 infection    Hyperlipidemia    Hyperplastic colon polyp    Mitral valve prolapse    mild to moderate MR by echo 10/2020   PVC's (premature ventricular contractions)    intol of BB, sxc palpitations r/t stress   RVOT ventricular tachycardia/PVCs    EP eval 01/2013 for freq PVCs   Past Surgical History:  Procedure Laterality Date   CESAREAN SECTION     x 3   COLONOSCOPY     KNEE SURGERY Right    MANDIBLE SURGERY     right side in front of ear   MOHS SURGERY  2021   POLYPECTOMY     WISDOM TOOTH EXTRACTION     Patient Active Problem List   Diagnosis Date Noted   AC (acromioclavicular) arthritis 05/28/2022   Mitral valve prolapse    Aortic regurgitation    NSVT (nonsustained ventricular tachycardia) (HCC) 09/25/2020   Diabetes mellitus type 2 with complications (HCC) 08/27/2020   Chronic pain of both shoulders 05/05/2018   Other fatigue 01/02/2018   Allergic rhinitis 01/02/2018   Routine general medical examination at a health care facility 09/01/2015    Hyperlipidemia associated with type 2 diabetes mellitus (HCC) 09/01/2015   Varicose vein 08/27/2014   Primary localized osteoarthrosis, lower leg 10/30/2013   RVOT ventricular tachycardia/PVCs 01/31/2013   Abnormal stress echo 12/11/2012    PCP: Myrlene Broker, MD  REFERRING PROVIDER: Judi Saa, DO  REFERRING DIAG: [phone] (ICD-10-CM) - Chronic pain of right knee  THERAPY DIAG:  Chronic pain of right knee  Stiffness of right knee, not elsewhere classified  Muscle weakness (generalized)  Rationale for Evaluation and Treatment: Rehabilitation  ONSET DATE: 03/31/2023  SUBJECTIVE:   SUBJECTIVE STATEMENT: Pt states that she did do her bike once over the weekend, but states that she does her HEP every morning in her bed before she gets out of bed.  Patient states that she felt good after last PT session, but did have some increased pain of 5/10 yesterday, but states that she is feeling better today.  PERTINENT HISTORY: Knee scope approx 15 years ago (right knee) PAIN:   Are you having pain? Yes: NPRS scale: 3/10 Pain location: bil knees right > left Pain description: aching, sharp Aggravating factors:  walking, standing, bending,stooping, squatting Relieving factors: rest, meds  PRECAUTIONS: Fall  RED FLAGS: None   WEIGHT BEARING RESTRICTIONS: No  FALLS:  Has patient fallen in last 6 months? No  LIVING ENVIRONMENT: Lives with: lives with their spouse Lives in: House/apartment Stairs: Yes: Internal: 12 steps; on right going up and External: 5 steps; on right going up Has following equipment at home: None  OCCUPATION: retired  PLOF: Independent, Independent with basic ADLs, Independent with household mobility without device, Independent with community mobility without device, Independent with homemaking with ambulation, Independent with gait, and Independent with transfers  PATIENT GOALS: to be able to walk and get up and down from a chair and do  her routine daily activities without pain and without her knee giving way  NEXT MD VISIT: prn  OBJECTIVE:  Note: Objective measures were completed at Evaluation unless otherwise noted.  DIAGNOSTIC FINDINGS:  05/30/22: RIGHT KNEE 3 VIEWS  COMPARISON:  Bilateral knee radiographs May 19th, 2015  FINDINGS: No acute fracture or dislocation. No joint effusion. Mild lateral compartment subchondral sclerosis and cystic changes with mild osteophytosis. No soft tissue abnormalities.  IMPRESSION: Mild lateral compartment osteoarthritis.  07/28/22 Left knee: Resulted by: Judi Saa, DO Performed: 07/28/22 1411 - 07/28/22 1411  Accession number: [id number] Resulting lab: [facility]  Narrative: Limited muscular skeletal ultrasound was performed and interpreted by Antoine Primas, M Limited ultrasound continues to show the patient does have some hypoechoic changes in the patellofemoral joint noted.  Some degenerative changes of the meniscus noted. Impression: Improvement in inflammation but still continue effusion    PATIENT SURVEYS:  Eval: FOTO 38, predicted 55 05/09/2023:  FOTO 49 12/18:  54 1/10: 55 taken out of FOTO  COGNITION: Overall cognitive status: Within functional limits for tasks assessed     SENSATION: WFL   POSTURE:  slight knee valgus bilaterally  PALPATION: Mod crepitus noted on seated open chain flexion/extension  LOWER EXTREMITY ROM:  WNL LOWER EXTREMITY MMT:  Generally 4/5 with exception of bilateral knee flexion 4-/5 and bilateral hip abd 4-/5 and bilateral hip ER 3+/5  LOWER EXTREMITY SPECIAL TESTS:  Knee special tests: Patellafemoral grind test: positive   FUNCTIONAL TESTS:  Eval: 5 times sit to stand: 24 sec Timed up and go (TUG): 14.5 sec  05/03/2023: 5 times sit to stand:  18.36 sec without UE use Timed up and go (TUG): 8.00 sec 3 minute walk test: 612 ft without increased pain with RPE of 3/10  12/18 5x 20.16 (painful  secondary to recent fall and prolonged standing yesterday) 6 MWT 1140 3/10 knee pain, back pain  3 MWT 550 feet  06/24/23: 5x STS painful: 18.84 no hands TUG 11 sec  07/11/2023: 5 times sit to stand: 13.80 sec with 4/10 knee right pain and dyspnea 6 minute walk test:  1192 ft with RPE of 5/10 with 3/10 right knee pain and back pain   GAIT: Distance walked: 50 feet Assistive device utilized: None Level of assistance: Complete Independence Comments: antalgic, slow, right LE externally rotated, guarded, short step length   TODAY'S TREATMENT:     DATE: 07/11/2023 Bike level 2 x 7 minutes with PT present to discuss status 5 times sit to stand: 13.80 sec with 4/10 knee right pain and dyspnea 6 minute walk test:  1192 ft with RPE of 5/10 with 3/10 right knee pain and back pain FWD step ups with 4" step and UE assist x7 bilat Lateral step ups with 4" step and UE assist  x7 each bilat Leg Press (seat at 7) 90# 2x10 Single leg press 45# x10 bilat 4 way resisted gait with 10# cable pulley x5 each direction Alt LE toe touch to 6" step without UE support x 30 sec no UE use   DATE: 07/07/2023 Bike level 2 x 7 minutes with PT present to discuss status Leg Press upright (seat at 7) 85# 2x10 Single Leg Press 45# left 15x; 45# right 10x (increased weight, fewer reps) FWD step ups with 4" step and UE assist x7 bilat Lateral 4 inch step up but needs railing assist 5x right 4 way resisted gait with 10#  backwards cable pulley x5; other directions 5# each direction 5x (increase to 10# on forward direction next time) Alt LE toe touch to 6" step without UE support x 30 sec no UE use Nu-Step seat 7, arms 8 (change these settings next time) L 2 (green machine) 6 min   DATE: 07/05/2023 Bike level 2 x 7 minutes with PT present to discuss status Hip Matrix 25#:  hip extension and hip abduction.  X5 each bilat Leg Press (seat at 7) 85# 2x10 Single Leg Press 45# left 15x; 40# right 15x FWD step ups with  4" step and UE assist x5 bilat 4 way resisted gait with 5# cable pulley x5 each direction Alt LE toe touch to 6" step without UE support x10 bilat Nu-Step L 2 (green machine) 4 min      PATIENT EDUCATION:  Education details: Initiated HEP Person educated: Patient Education method: Programmer, multimedia, Facilities manager, Verbal cues, and Handouts Education comprehension: verbalized understanding, returned demonstration, and verbal cues required  HOME EXERCISE PROGRAM: Access Code: [email or web address] URL: [email or web address] Date: 04/12/2023 Prepared by: Lavinia Sharps  Exercises - Long Sitting Quad Set  - 1 x daily - 7 x weekly - 3 sets - 10 reps - Supine Knee Extension Strengthening  - 1 x daily - 7 x weekly - 3 sets - 10 reps - Seated Long Arc Quad  - 1 x daily - 7 x weekly - 3 sets - 10 reps - Straight Leg Raise  - 1 x daily - 7 x weekly - 1 sets - 10 reps - Clamshell  - 1 x daily - 7 x weekly - 1 sets - 10 reps - Seated Hamstring Curl with Anchored Resistance  - 1 x daily - 7 x weekly - 1 sets - 10 reps - Standing Heel Raise with Support  - 1 x daily - 7 x weekly - 1 sets - 10 reps  ASSESSMENT:  CLINICAL IMPRESSION: Ms Stumpo presents to skilled PT reporting that she felt good after last PT session.  She states that she did use her exercise bike once this weekend.  Patient able to progress to 10# on all 4 directions of the resisted walking.  Patient with slight increase on distance for 6 minute walk test, but continues to have dyspnea and knee pain.  Patient continues to require skilled PT to progress towards goal related activities.   OBJECTIVE IMPAIRMENTS: Abnormal gait, decreased balance, decreased knowledge of use of DME, decreased mobility, difficulty walking, decreased strength, increased edema, increased fascial restrictions, increased muscle spasms, impaired flexibility, postural dysfunction, obesity, and pain.   ACTIVITY LIMITATIONS: lifting, bending, sitting, standing,  squatting, sleeping, stairs, transfers, bed mobility, bathing, toileting, dressing, and caring for others  PARTICIPATION LIMITATIONS: meal prep, cleaning, laundry, driving, shopping, community activity, yard work, and church  PERSONAL FACTORS: Fitness, Time since onset of injury/illness/exacerbation, and 1-2 comorbidities:  anxiety and obesity  are also affecting patient's functional outcome.   REHAB POTENTIAL: Fair due to possible mechanical meniscus injury or joint deterioration  CLINICAL DECISION MAKING: Evolving/moderate complexity  EVALUATION COMPLEXITY: Moderate   GOALS: Goals reviewed with patient? Yes  SHORT TERM GOALS: Target date: 05/04/2023   Patient will be independent with initial HEP  Baseline: Goal status: MET on 05/03/2023  2.  Pain report to be no greater than 4/10  Baseline:  Goal status: Met on 06/30/2023   LONG TERM GOALS: Target date: 07/27/2023   Patient to be independent with advanced HEP  Baseline:  Goal status: Ongoing  2.  Patient to report pain no greater than 2/10  Baseline:  Goal status: Ongoing  3.  Patient to be able to bend, stoop and squat with pain no greater than 2/10  Baseline:  Goal status: Ongoing  4.  Patient to be able to ascend and descend steps without pain or no greater than 2/10  Baseline:  Goal status: Ongoing  5.  Patient to be able to sleep through the night  Baseline:  Goal status: met 1/21  6.  Patient to report 85% improvement in overall symptoms  Baseline:  Goal status: Ongoing 7.  Six minute walk test 1200 feet indicating improved gait speed and strength.  Ongoing  8. Able to rise sit to stand from a standard chair with minimal knee pain (5x STS <17 sec) Partially met (met for time, not for pain)   PLAN:  PT FREQUENCY: 1-2x/week  PT DURATION: 8 weeks  PLANNED INTERVENTIONS: 97110-Therapeutic exercises, 97530- Therapeutic activity, [id number]- Neuromuscular re-education, 97535- Self Care, 16109- Manual  therapy, [id number]- Gait training, [phone]- Aquatic Therapy, [phone]- Splinting, 97014- Electrical stimulation (unattended), [phone]- Electrical stimulation (manual), 97016- Vasopneumatic device, [id number]- Ultrasound, [id number]- Ionotophoresis 4mg /ml Dexamethasone, Patient/Family education, Balance training, Stair training, Taping, Dry Needling, Joint mobilization, Scar mobilization, Compression bandaging, Vestibular training, Visual/preceptual remediation/compensation, Cryotherapy, and Moist heat  PLAN FOR NEXT SESSION:  2 or 4 inch step ups; increase resistance with forward cable walking; leg press; quad, HS, glute strengthening in unloaded positions and partial load   Reather Laurence, PT, DPT 07/11/23, 12:40 PM  [facility] [address], [address] [place], [place] [address] Phone # [phone] Fax [phone]

## 2023-07-14 ENCOUNTER — Ambulatory Visit: Payer: PPO | Admitting: Physical Therapy

## 2023-07-14 DIAGNOSIS — G8929 Other chronic pain: Secondary | ICD-10-CM

## 2023-07-14 DIAGNOSIS — M25561 Pain in right knee: Secondary | ICD-10-CM | POA: Diagnosis not present

## 2023-07-14 DIAGNOSIS — M25661 Stiffness of right knee, not elsewhere classified: Secondary | ICD-10-CM

## 2023-07-14 DIAGNOSIS — M6281 Muscle weakness (generalized): Secondary | ICD-10-CM

## 2023-07-14 NOTE — Therapy (Signed)
OUTPATIENT PHYSICAL THERAPY TREATMENT NOTE  Patient Name: Jean Smith MRN: [id number] DOB:[birth year], 73 y.o., female Today's Date: 07/14/2023     END OF SESSION:  PT End of Session - 07/14/23 1157     Visit Number 26    Date for PT Re-Evaluation 07/27/23    Authorization Type [employer]    Progress Note Due on Visit 30    PT Start Time 1156   late   PT Stop Time 1229    PT Time Calculation (min) 33 min    Activity Tolerance Patient tolerated treatment well             Past Medical History:  Diagnosis Date   Allergy    Anxiety    Aortic regurgitation    moderate by echo 10/2020   Arthritis    Cancer (HCC)    skin   Cataract    bilateral - MD is just watching    Diverticulosis    Fuchs' endothelial dystrophy    follows with optho regularly    Gestational diabetes    Heart murmur    MVP    History of depression    HSV-2 infection    Hyperlipidemia    Hyperplastic colon polyp    Mitral valve prolapse    mild to moderate MR by echo 10/2020   PVC's (premature ventricular contractions)    intol of BB, sxc palpitations r/t stress   RVOT ventricular tachycardia/PVCs    EP eval 01/2013 for freq PVCs   Past Surgical History:  Procedure Laterality Date   CESAREAN SECTION     x 3   COLONOSCOPY     KNEE SURGERY Right    MANDIBLE SURGERY     right side in front of ear   MOHS SURGERY  2021   POLYPECTOMY     WISDOM TOOTH EXTRACTION     Patient Active Problem List   Diagnosis Date Noted   AC (acromioclavicular) arthritis 05/28/2022   Mitral valve prolapse    Aortic regurgitation    NSVT (nonsustained ventricular tachycardia) (HCC) 09/25/2020   Diabetes mellitus type 2 with complications (HCC) 08/27/2020   Chronic pain of both shoulders 05/05/2018   Other fatigue 01/02/2018   Allergic rhinitis 01/02/2018   Routine general medical examination at a health care facility 09/01/2015   Hyperlipidemia associated with type 2 diabetes mellitus (HCC)  09/01/2015   Varicose vein 08/27/2014   Primary localized osteoarthrosis, lower leg 10/30/2013   RVOT ventricular tachycardia/PVCs 01/31/2013   Abnormal stress echo 12/11/2012    PCP: Myrlene Broker, MD  REFERRING PROVIDER: Judi Saa, DO  REFERRING DIAG: [phone] (ICD-10-CM) - Chronic pain of right knee  THERAPY DIAG:  Chronic pain of right knee  Stiffness of right knee, not elsewhere classified  Muscle weakness (generalized)  Rationale for Evaluation and Treatment: Rehabilitation  ONSET DATE: 03/31/2023  SUBJECTIVE:   SUBJECTIVE STATEMENT: I really hurt yesterday but I don't know why.  It woke me up every hour.  When I leave here I feel good but the next day I'm slow to get out of bed.  It's not muscle soreness.  PERTINENT HISTORY: Knee scope approx 15 years ago (right knee) PAIN:   Are you having pain? Yes: NPRS scale: 3/10 Pain location: bil knees right > left Pain description: aching, sharp Aggravating factors: walking, standing, bending,stooping, squatting Relieving factors: rest, meds  PRECAUTIONS: Fall  RED FLAGS: None   WEIGHT BEARING RESTRICTIONS: No  FALLS:  Has patient  fallen in last 6 months? No  LIVING ENVIRONMENT: Lives with: lives with their spouse Lives in: House/apartment Stairs: Yes: Internal: 12 steps; on right going up and External: 5 steps; on right going up Has following equipment at home: None  OCCUPATION: retired  PLOF: Independent, Independent with basic ADLs, Independent with household mobility without device, Independent with community mobility without device, Independent with homemaking with ambulation, Independent with gait, and Independent with transfers  PATIENT GOALS: to be able to walk and get up and down from a chair and do her routine daily activities without pain and without her knee giving way  NEXT MD VISIT: prn  OBJECTIVE:  Note: Objective measures were completed at Evaluation unless otherwise  noted.  DIAGNOSTIC FINDINGS:  05/30/22: RIGHT KNEE 3 VIEWS  COMPARISON:  Bilateral knee radiographs May 19th, 2015  FINDINGS: No acute fracture or dislocation. No joint effusion. Mild lateral compartment subchondral sclerosis and cystic changes with mild osteophytosis. No soft tissue abnormalities.  IMPRESSION: Mild lateral compartment osteoarthritis.  07/28/22 Left knee: Resulted by: Judi Saa, DO Performed: 07/28/22 1411 - 07/28/22 1411  Accession number: [id number] Resulting lab: [facility]  Narrative: Limited muscular skeletal ultrasound was performed and interpreted by Antoine Primas, M Limited ultrasound continues to show the patient does have some hypoechoic changes in the patellofemoral joint noted.  Some degenerative changes of the meniscus noted. Impression: Improvement in inflammation but still continue effusion    PATIENT SURVEYS:  Eval: FOTO 38, predicted 55 05/09/2023:  FOTO 49 12/18:  54 1/10: 55 taken out of FOTO  COGNITION: Overall cognitive status: Within functional limits for tasks assessed     SENSATION: WFL   POSTURE:  slight knee valgus bilaterally  PALPATION: Mod crepitus noted on seated open chain flexion/extension  LOWER EXTREMITY ROM:  WNL LOWER EXTREMITY MMT:  Generally 4/5 with exception of bilateral knee flexion 4-/5 and bilateral hip abd 4-/5 and bilateral hip ER 3+/5  LOWER EXTREMITY SPECIAL TESTS:  Knee special tests: Patellafemoral grind test: positive   FUNCTIONAL TESTS:  Eval: 5 times sit to stand: 24 sec Timed up and go (TUG): 14.5 sec  05/03/2023: 5 times sit to stand:  18.36 sec without UE use Timed up and go (TUG): 8.00 sec 3 minute walk test: 612 ft without increased pain with RPE of 3/10  12/18 5x 20.16 (painful secondary to recent fall and prolonged standing yesterday) 6 MWT 1140 3/10 knee pain, back pain  3 MWT 550 feet  06/24/23: 5x STS painful: 18.84 no hands TUG 11 sec  07/11/2023: 5  times sit to stand: 13.80 sec with 4/10 knee right pain and dyspnea 6 minute walk test:  1192 ft with RPE of 5/10 with 3/10 right knee pain and back pain   GAIT: Distance walked: 50 feet Assistive device utilized: None Level of assistance: Complete Independence Comments: antalgic, slow, right LE externally rotated, guarded, short step length   TODAY'S TREATMENT:    DATE: 07/14/2023 Bike level 2 x 5 minutes with PT present to discuss status FWD step ups with 4" step and UE assist x10 single arm support Hip hinge to knee level with pair of 5# weights 8x Leg Press (seat at 7) 90# 10x Single leg press 45# x10 bilat Resisted backward walk with aqua power cord 10x Alt LE toe touch to 4" step without UE support x 10  Nu-Step seat 8, arms 8 (seems to be the best settings)  L 3 (green machine) 10 min   DATE: 07/11/2023  Bike level 2 x 7 minutes with PT present to discuss status 5 times sit to stand: 13.80 sec with 4/10 knee right pain and dyspnea 6 minute walk test:  1192 ft with RPE of 5/10 with 3/10 right knee pain and back pain FWD step ups with 4" step and UE assist x7 bilat Lateral step ups with 4" step and UE assist x7 each bilat Leg Press (seat at 7) 90# 2x10 Single leg press 45# x10 bilat 4 way resisted gait with 10# cable pulley x5 each direction Alt LE toe touch to 6" step without UE support x 30 sec no UE use   DATE: 07/07/2023 Bike level 2 x 7 minutes with PT present to discuss status Leg Press upright (seat at 7) 85# 2x10 Single Leg Press 45# left 15x; 45# right 10x (increased weight, fewer reps) FWD step ups with 4" step and UE assist x7 bilat Lateral 4 inch step up but needs railing assist 5x right 4 way resisted gait with 10#  backwards cable pulley x5; other directions 5# each direction 5x (increase to 10# on forward direction next time) Alt LE toe touch to 6" step without UE support x 30 sec no UE use Nu-Step seat 7, arms 8 (change these settings next time) L 2 (green  machine) 6 min   DATE: 07/05/2023 Bike level 2 x 7 minutes with PT present to discuss status Hip Matrix 25#:  hip extension and hip abduction.  X5 each bilat Leg Press (seat at 7) 85# 2x10 Single Leg Press 45# left 15x; 40# right 15x FWD step ups with 4" step and UE assist x5 bilat 4 way resisted gait with 5# cable pulley x5 each direction Alt LE toe touch to 6" step without UE support x10 bilat Nu-Step L 2 (green machine) 4 min      PATIENT EDUCATION:  Education details: Initiated HEP Person educated: Patient Education method: Programmer, multimedia, Facilities manager, Verbal cues, and Handouts Education comprehension: verbalized understanding, returned demonstration, and verbal cues required  HOME EXERCISE PROGRAM: Access Code: [email or web address] URL: [email or web address] Date: 04/12/2023 Prepared by: Lavinia Sharps  Exercises - Long Sitting Quad Set  - 1 x daily - 7 x weekly - 3 sets - 10 reps - Supine Knee Extension Strengthening  - 1 x daily - 7 x weekly - 3 sets - 10 reps - Seated Long Arc Quad  - 1 x daily - 7 x weekly - 3 sets - 10 reps - Straight Leg Raise  - 1 x daily - 7 x weekly - 1 sets - 10 reps - Clamshell  - 1 x daily - 7 x weekly - 1 sets - 10 reps - Seated Hamstring Curl with Anchored Resistance  - 1 x daily - 7 x weekly - 1 sets - 10 reps - Standing Heel Raise with Support  - 1 x daily - 7 x weekly - 1 sets - 10 reps  ASSESSMENT:  CLINICAL IMPRESSION: Shortened session secondary to late arrival.  Knee pain level is low on arrival although she has had some periods of increased pain yesterday and night.  She is able to perform lower quarter strengthening targeting quads, glutes and HS with low pain or without pain. Therapist monitoring response with patient reporting she feels good at the conclusion of session.  Will continue to promote independence with equipment set up and with home ex program.   OBJECTIVE IMPAIRMENTS: Abnormal gait, decreased balance, decreased  knowledge of use of DME, decreased mobility, difficulty walking, decreased  strength, increased edema, increased fascial restrictions, increased muscle spasms, impaired flexibility, postural dysfunction, obesity, and pain.   ACTIVITY LIMITATIONS: lifting, bending, sitting, standing, squatting, sleeping, stairs, transfers, bed mobility, bathing, toileting, dressing, and caring for others  PARTICIPATION LIMITATIONS: meal prep, cleaning, laundry, driving, shopping, community activity, yard work, and church  PERSONAL FACTORS: Fitness, Time since onset of injury/illness/exacerbation, and 1-2 comorbidities: anxiety and obesity  are also affecting patient's functional outcome.   REHAB POTENTIAL: Fair due to possible mechanical meniscus injury or joint deterioration  CLINICAL DECISION MAKING: Evolving/moderate complexity  EVALUATION COMPLEXITY: Moderate   GOALS: Goals reviewed with patient? Yes  SHORT TERM GOALS: Target date: 05/04/2023   Patient will be independent with initial HEP  Baseline: Goal status: MET on 05/03/2023  2.  Pain report to be no greater than 4/10  Baseline:  Goal status: Met on 06/30/2023   LONG TERM GOALS: Target date: 07/27/2023   Patient to be independent with advanced HEP  Baseline:  Goal status: Ongoing  2.  Patient to report pain no greater than 2/10  Baseline:  Goal status: Ongoing  3.  Patient to be able to bend, stoop and squat with pain no greater than 2/10  Baseline:  Goal status: Ongoing  4.  Patient to be able to ascend and descend steps without pain or no greater than 2/10  Baseline:  Goal status: Ongoing  5.  Patient to be able to sleep through the night  Baseline:  Goal status: met 1/21  6.  Patient to report 85% improvement in overall symptoms  Baseline:  Goal status: Ongoing 7.  Six minute walk test 1200 feet indicating improved gait speed and strength.  Ongoing  8. Able to rise sit to stand from a standard chair with minimal knee  pain (5x STS <17 sec) Partially met (met for time, not for pain)   PLAN:  PT FREQUENCY: 1-2x/week  PT DURATION: 8 weeks  PLANNED INTERVENTIONS: 97110-Therapeutic exercises, 97530- Therapeutic activity, [id number]- Neuromuscular re-education, 97535- Self Care, 16109- Manual therapy, [id number]- Gait training, [id number]- Aquatic Therapy, [phone]- Splinting, 97014- Electrical stimulation (unattended), [id number]- Electrical stimulation (manual), 97016- Vasopneumatic device, 97035- Ultrasound, 29562- Ionotophoresis 4mg /ml Dexamethasone, Patient/Family education, Balance training, Stair training, Taping, Dry Needling, Joint mobilization, Scar mobilization, Compression bandaging, Vestibular training, Visual/preceptual remediation/compensation, Cryotherapy, and Moist heat  PLAN FOR NEXT SESSION:  2 or 4 inch step ups; increase resistance with forward cable walking; leg press; quad, HS, glute strengthening in unloaded positions and partial load  Lavinia Sharps, PT 07/14/23 12:42 PM Phone: [phone] Fax: [phone]  [facility] [address], [address] [place], [place] [address] Phone # [phone] Fax [phone]

## 2023-07-18 ENCOUNTER — Encounter: Payer: Self-pay | Admitting: Rehabilitative and Restorative Service Providers"

## 2023-07-18 ENCOUNTER — Ambulatory Visit: Payer: PPO | Attending: Family Medicine | Admitting: Rehabilitative and Restorative Service Providers"

## 2023-07-18 DIAGNOSIS — M25561 Pain in right knee: Secondary | ICD-10-CM | POA: Insufficient documentation

## 2023-07-18 DIAGNOSIS — M25661 Stiffness of right knee, not elsewhere classified: Secondary | ICD-10-CM | POA: Diagnosis not present

## 2023-07-18 DIAGNOSIS — R262 Difficulty in walking, not elsewhere classified: Secondary | ICD-10-CM | POA: Diagnosis not present

## 2023-07-18 DIAGNOSIS — M6281 Muscle weakness (generalized): Secondary | ICD-10-CM | POA: Diagnosis not present

## 2023-07-18 DIAGNOSIS — G8929 Other chronic pain: Secondary | ICD-10-CM | POA: Insufficient documentation

## 2023-07-18 NOTE — Therapy (Signed)
OUTPATIENT PHYSICAL THERAPY TREATMENT NOTE  Patient Name: SYDNEY HASTEN MRN: [id number] DOB:03-25-1951, 73 y.o., female Today's Date: 07/18/2023     END OF SESSION:  PT End of Session - 07/18/23 1151     Visit Number 27    Date for PT Re-Evaluation 07/27/23    Authorization Type [employer]    Progress Note Due on Visit 30    PT Start Time 1148    PT Stop Time 1226    PT Time Calculation (min) 38 min    Activity Tolerance Patient tolerated treatment well    Behavior During Therapy WFL for tasks assessed/performed             Past Medical History:  Diagnosis Date   Allergy    Anxiety    Aortic regurgitation    moderate by echo 10/2020   Arthritis    Cancer (HCC)    skin   Cataract    bilateral - MD is just watching    Diverticulosis    Fuchs' endothelial dystrophy    follows with optho regularly    Gestational diabetes    Heart murmur    MVP    History of depression    HSV-2 infection    Hyperlipidemia    Hyperplastic colon polyp    Mitral valve prolapse    mild to moderate MR by echo 10/2020   PVC's (premature ventricular contractions)    intol of BB, sxc palpitations r/t stress   RVOT ventricular tachycardia/PVCs    EP eval 01/2013 for freq PVCs   Past Surgical History:  Procedure Laterality Date   CESAREAN SECTION     x 3   COLONOSCOPY     KNEE SURGERY Right    MANDIBLE SURGERY     right side in front of ear   MOHS SURGERY  2021   POLYPECTOMY     WISDOM TOOTH EXTRACTION     Patient Active Problem List   Diagnosis Date Noted   AC (acromioclavicular) arthritis 05/28/2022   Mitral valve prolapse    Aortic regurgitation    NSVT (nonsustained ventricular tachycardia) (HCC) 09/25/2020   Diabetes mellitus type 2 with complications (HCC) 08/27/2020   Chronic pain of both shoulders 05/05/2018   Other fatigue 01/02/2018   Allergic rhinitis 01/02/2018   Routine general medical examination at a health care facility 09/01/2015    Hyperlipidemia associated with type 2 diabetes mellitus (HCC) 09/01/2015   Varicose vein 08/27/2014   Primary localized osteoarthrosis, lower leg 10/30/2013   RVOT ventricular tachycardia/PVCs 01/31/2013   Abnormal stress echo 12/11/2012    PCP: Myrlene Broker, MD  REFERRING PROVIDER: Judi Saa, DO  REFERRING DIAG: [id number] (ICD-10-CM) - Chronic pain of right knee  THERAPY DIAG:  Chronic pain of right knee  Stiffness of right knee, not elsewhere classified  Muscle weakness (generalized)  Rationale for Evaluation and Treatment: Rehabilitation  ONSET DATE: 03/31/2023  SUBJECTIVE:   SUBJECTIVE STATEMENT: Patient reports increased pain this morning and last night, but states that she had to stand for prolonged periods yesterday when cooking.  PERTINENT HISTORY: Knee scope approx 15 years ago (right knee) PAIN:   Are you having pain? Yes: NPRS scale: 5-9/10 Pain location: bil knees right > left Pain description: aching, sharp Aggravating factors: walking, standing, bending,stooping, squatting Relieving factors: rest, meds  PRECAUTIONS: Fall  RED FLAGS: None   WEIGHT BEARING RESTRICTIONS: No  FALLS:  Has patient fallen in last 6 months? No  LIVING ENVIRONMENT: Lives  with: lives with their spouse Lives in: House/apartment Stairs: Yes: Internal: 12 steps; on right going up and External: 5 steps; on right going up Has following equipment at home: None  OCCUPATION: retired  PLOF: Independent, Independent with basic ADLs, Independent with household mobility without device, Independent with community mobility without device, Independent with homemaking with ambulation, Independent with gait, and Independent with transfers  PATIENT GOALS: to be able to walk and get up and down from a chair and do her routine daily activities without pain and without her knee giving way  NEXT MD VISIT: prn  OBJECTIVE:  Note: Objective measures were completed at  Evaluation unless otherwise noted.  DIAGNOSTIC FINDINGS:  05/30/22: RIGHT KNEE 3 VIEWS  COMPARISON:  Bilateral knee radiographs May 19th, 2015  FINDINGS: No acute fracture or dislocation. No joint effusion. Mild lateral compartment subchondral sclerosis and cystic changes with mild osteophytosis. No soft tissue abnormalities.  IMPRESSION: Mild lateral compartment osteoarthritis.  07/28/22 Left knee: Resulted by: Judi Saa, DO Performed: 07/28/22 1411 - 07/28/22 1411  Accession number: [id number] Resulting lab: [facility]  Narrative: Limited muscular skeletal ultrasound was performed and interpreted by Antoine Primas, M Limited ultrasound continues to show the patient does have some hypoechoic changes in the patellofemoral joint noted.  Some degenerative changes of the meniscus noted. Impression: Improvement in inflammation but still continue effusion    PATIENT SURVEYS:  Eval: FOTO 38, predicted 55 05/09/2023:  FOTO 49 12/18:  54 1/10: 55 taken out of FOTO  COGNITION: Overall cognitive status: Within functional limits for tasks assessed     SENSATION: WFL   POSTURE:  slight knee valgus bilaterally  PALPATION: Mod crepitus noted on seated open chain flexion/extension  LOWER EXTREMITY ROM:  WNL LOWER EXTREMITY MMT:  Generally 4/5 with exception of bilateral knee flexion 4-/5 and bilateral hip abd 4-/5 and bilateral hip ER 3+/5  LOWER EXTREMITY SPECIAL TESTS:  Knee special tests: Patellafemoral grind test: positive   FUNCTIONAL TESTS:  Eval: 5 times sit to stand: 24 sec Timed up and go (TUG): 14.5 sec  05/03/2023: 5 times sit to stand:  18.36 sec without UE use Timed up and go (TUG): 8.00 sec 3 minute walk test: 612 ft without increased pain with RPE of 3/10  12/18 5x 20.16 (painful secondary to recent fall and prolonged standing yesterday) 6 MWT 1140 3/10 knee pain, back pain  3 MWT 550 feet  06/24/23: 5x STS painful: 18.84 no hands TUG  11 sec  07/11/2023: 5 times sit to stand: 13.80 sec with 4/10 knee right pain and dyspnea 6 minute walk test:  1192 ft with RPE of 5/10 with 3/10 right knee pain and back pain   GAIT: Distance walked: 50 feet Assistive device utilized: None Level of assistance: Complete Independence Comments: antalgic, slow, right LE externally rotated, guarded, short step length   TODAY'S TREATMENT:     DATE: 07/18/2023 Bike level 2 x 7 minutes with PT present to discuss status Standing hamstring stretch at stairs 2x20 sec bilat Alt LE toe touch to 6" step without UE support 2x30 sec no UE use Hip hinge to knee level with pair of 5# weights 2x10 Side stepping on AirEx beam in parallel bars down and back x3 (with UE use as needed fpr balance) Tandem gait on AirEx beam in parallel bars down and back x3 (with UE use as needed for balance) Standing marching on foam pad with UE support for balance x1 min Leg Press (seat at 7)  90# 2x10 Single leg press 45# x10 bilat   DATE: 07/14/2023 Bike level 2 x 5 minutes with PT present to discuss status FWD step ups with 4" step and UE assist x10 single arm support Hip hinge to knee level with pair of 5# weights 8x Leg Press (seat at 7) 90# 10x Single leg press 45# x10 bilat Resisted backward walk with aqua power cord 10x Alt LE toe touch to 4" step without UE support x 10  Nu-Step seat 8, arms 8 (seems to be the best settings)  L 3 (green machine) 10 min   DATE: 07/11/2023 Bike level 2 x 7 minutes with PT present to discuss status 5 times sit to stand: 13.80 sec with 4/10 knee right pain and dyspnea 6 minute walk test:  1192 ft with RPE of 5/10 with 3/10 right knee pain and back pain FWD step ups with 4" step and UE assist x7 bilat Lateral step ups with 4" step and UE assist x7 each bilat Leg Press (seat at 7) 90# 2x10 Single leg press 45# x10 bilat 4 way resisted gait with 10# cable pulley x5 each direction Alt LE toe touch to 6" step without UE support x  30 sec no UE use   PATIENT EDUCATION:  Education details: Initiated HEP Person educated: Patient Education method: Programmer, multimedia, Facilities manager, Verbal cues, and Handouts Education comprehension: verbalized understanding, returned demonstration, and verbal cues required  HOME EXERCISE PROGRAM: Access Code: [email or web address] URL: [email or web address] Date: 04/12/2023 Prepared by: Lavinia Sharps  Exercises - Long Sitting Quad Set  - 1 x daily - 7 x weekly - 3 sets - 10 reps - Supine Knee Extension Strengthening  - 1 x daily - 7 x weekly - 3 sets - 10 reps - Seated Long Arc Quad  - 1 x daily - 7 x weekly - 3 sets - 10 reps - Straight Leg Raise  - 1 x daily - 7 x weekly - 1 sets - 10 reps - Clamshell  - 1 x daily - 7 x weekly - 1 sets - 10 reps - Seated Hamstring Curl with Anchored Resistance  - 1 x daily - 7 x weekly - 1 sets - 10 reps - Standing Heel Raise with Support  - 1 x daily - 7 x weekly - 1 sets - 10 reps  ASSESSMENT:  CLINICAL IMPRESSION: Ms Noblett presents to PT reporting increased pain today, but believes that it could be secondary to increased time standing yesterday to cook.  Patient able to progress with session and added some standing balance exercises today.  Patient reported that she enjoyed the new balance exercises and felt it working some muscles that she hadn't felt as much before.  Patient does require UE support for balance tasks with AirEx beam.  Patient with great participation and reports pain decreases to 3/10 by end of session.  OBJECTIVE IMPAIRMENTS: Abnormal gait, decreased balance, decreased knowledge of use of DME, decreased mobility, difficulty walking, decreased strength, increased edema, increased fascial restrictions, increased muscle spasms, impaired flexibility, postural dysfunction, obesity, and pain.   ACTIVITY LIMITATIONS: lifting, bending, sitting, standing, squatting, sleeping, stairs, transfers, bed mobility, bathing, toileting, dressing,  and caring for others  PARTICIPATION LIMITATIONS: meal prep, cleaning, laundry, driving, shopping, community activity, yard work, and church  PERSONAL FACTORS: Fitness, Time since onset of injury/illness/exacerbation, and 1-2 comorbidities: anxiety and obesity  are also affecting patient's functional outcome.   REHAB POTENTIAL: Fair due to possible mechanical meniscus injury or joint deterioration  CLINICAL  DECISION MAKING: Evolving/moderate complexity  EVALUATION COMPLEXITY: Moderate   GOALS: Goals reviewed with patient? Yes  SHORT TERM GOALS: Target date: 05/04/2023   Patient will be independent with initial HEP  Baseline: Goal status: MET on 05/03/2023  2.  Pain report to be no greater than 4/10  Baseline:  Goal status: Met on 06/30/2023   LONG TERM GOALS: Target date: 07/27/2023   Patient to be independent with advanced HEP  Baseline:  Goal status: Ongoing  2.  Patient to report pain no greater than 2/10  Baseline:  Goal status: Ongoing  3.  Patient to be able to bend, stoop and squat with pain no greater than 2/10  Baseline:  Goal status: Ongoing  4.  Patient to be able to ascend and descend steps without pain or no greater than 2/10  Baseline:  Goal status: Ongoing  5.  Patient to be able to sleep through the night  Baseline:  Goal status: met 1/21  6.  Patient to report 85% improvement in overall symptoms  Baseline:  Goal status: Ongoing 7.  Six minute walk test 1200 feet indicating improved gait speed and strength.  Ongoing  8. Able to rise sit to stand from a standard chair with minimal knee pain (5x STS <17 sec) Partially met (met for time, not for pain)   PLAN:  PT FREQUENCY: 1-2x/week  PT DURATION: 8 weeks  PLANNED INTERVENTIONS: 97110-Therapeutic exercises, 97530- Therapeutic activity, [id number]- Neuromuscular re-education, 97535- Self Care, 95621- Manual therapy, [id number]- Gait training, [id number]- Aquatic Therapy, [phone]- Splinting, 97014-  Electrical stimulation (unattended), [id number]- Electrical stimulation (manual), 97016- Vasopneumatic device, [id number]- Ultrasound, [id number]- Ionotophoresis 4mg /ml Dexamethasone, Patient/Family education, Balance training, Stair training, Taping, Dry Needling, Joint mobilization, Scar mobilization, Compression bandaging, Vestibular training, Visual/preceptual remediation/compensation, Cryotherapy, and Moist heat  PLAN FOR NEXT SESSION:  Upcoming reassessment/ERO; increase resistance with forward cable walking; leg press; quad, HS, glute strengthening in unloaded positions and partial load   Reather Laurence, PT, DPT 07/18/23, 12:36 PM  [facility] [address], [address] [place], [place] [address] Phone # [phone] Fax [phone]

## 2023-07-21 ENCOUNTER — Ambulatory Visit: Payer: PPO | Admitting: Physical Therapy

## 2023-07-21 DIAGNOSIS — M25561 Pain in right knee: Secondary | ICD-10-CM | POA: Diagnosis not present

## 2023-07-21 DIAGNOSIS — R262 Difficulty in walking, not elsewhere classified: Secondary | ICD-10-CM

## 2023-07-21 DIAGNOSIS — M6281 Muscle weakness (generalized): Secondary | ICD-10-CM

## 2023-07-21 DIAGNOSIS — G8929 Other chronic pain: Secondary | ICD-10-CM

## 2023-07-21 DIAGNOSIS — M25661 Stiffness of right knee, not elsewhere classified: Secondary | ICD-10-CM

## 2023-07-21 NOTE — Therapy (Signed)
 OUTPATIENT PHYSICAL THERAPY TREATMENT NOTE  Patient Name: Jean Smith MRN: [id number] DOB:05/06/1951, 73 y.o., female Today's Date: 07/21/2023     END OF SESSION:  PT End of Session - 07/21/23 1152     Visit Number 28    Date for PT Re-Evaluation 07/27/23    Authorization Type [employer]    Progress Note Due on Visit 30    PT Start Time 1150    PT Stop Time 1230    PT Time Calculation (min) 40 min    Activity Tolerance Patient tolerated treatment well             Past Medical History:  Diagnosis Date   Allergy    Anxiety    Aortic regurgitation    moderate by echo 10/2020   Arthritis    Cancer (HCC)    skin   Cataract    bilateral - MD is just watching    Diverticulosis    Fuchs' endothelial dystrophy    follows with optho regularly    Gestational diabetes    Heart murmur    MVP    History of depression    HSV-2 infection    Hyperlipidemia    Hyperplastic colon polyp    Mitral valve prolapse    mild to moderate MR by echo 10/2020   PVC's (premature ventricular contractions)    intol of BB, sxc palpitations r/t stress   RVOT ventricular tachycardia/PVCs    EP eval 01/2013 for freq PVCs   Past Surgical History:  Procedure Laterality Date   CESAREAN SECTION     x 3   COLONOSCOPY     KNEE SURGERY Right    MANDIBLE SURGERY     right side in front of ear   MOHS SURGERY  2021   POLYPECTOMY     WISDOM TOOTH EXTRACTION     Patient Active Problem List   Diagnosis Date Noted   AC (acromioclavicular) arthritis 05/28/2022   Mitral valve prolapse    Aortic regurgitation    NSVT (nonsustained ventricular tachycardia) (HCC) 09/25/2020   Diabetes mellitus type 2 with complications (HCC) 08/27/2020   Chronic pain of both shoulders 05/05/2018   Other fatigue 01/02/2018   Allergic rhinitis 01/02/2018   Routine general medical examination at a health care facility 09/01/2015   Hyperlipidemia associated with type 2 diabetes mellitus (HCC)  09/01/2015   Varicose vein 08/27/2014   Primary localized osteoarthrosis, lower leg 10/30/2013   RVOT ventricular tachycardia/PVCs 01/31/2013   Abnormal stress echo 12/11/2012    PCP: Rollene Almarie LABOR, MD  REFERRING PROVIDER: Claudene Arthea HERO, DO  REFERRING DIAG: [phone] (ICD-10-CM) - Chronic pain of right knee  THERAPY DIAG:  Chronic pain of right knee  Stiffness of right knee, not elsewhere classified  Muscle weakness (generalized)  Difficulty in walking, not elsewhere classified  Rationale for Evaluation and Treatment: Rehabilitation  ONSET DATE: 03/31/2023  SUBJECTIVE:   SUBJECTIVE STATEMENT: After last time I felt better. I really liked the foam pad balance exercises. Yesterday my shins were really sore so I used Voltaren.  Pain was 5/10 when I got up but now it's about a 3/10  PERTINENT HISTORY: Knee scope approx 15 years ago (right knee) PAIN:   Are you having pain? Yes: NPRS scale: 3/10 Pain location: bil knees right > left Pain description: aching, sharp Aggravating factors: walking, standing, bending,stooping, squatting Relieving factors: rest, meds  PRECAUTIONS: Fall  RED FLAGS: None   WEIGHT BEARING RESTRICTIONS: No  FALLS:  Has patient fallen in last 6 months? No  LIVING ENVIRONMENT: Lives with: lives with their spouse Lives in: House/apartment Stairs: Yes: Internal: 12 steps; on right going up and External: 5 steps; on right going up Has following equipment at home: None  OCCUPATION: retired  PLOF: Independent, Independent with basic ADLs, Independent with household mobility without device, Independent with community mobility without device, Independent with homemaking with ambulation, Independent with gait, and Independent with transfers  PATIENT GOALS: to be able to walk and get up and down from a chair and do her routine daily activities without pain and without her knee giving way  NEXT MD VISIT: prn  OBJECTIVE:  Note:  Objective measures were completed at Evaluation unless otherwise noted.  DIAGNOSTIC FINDINGS:  05/30/22: RIGHT KNEE 3 VIEWS  COMPARISON:  Bilateral knee radiographs May 19th, 2015  FINDINGS: No acute fracture or dislocation. No joint effusion. Mild lateral compartment subchondral sclerosis and cystic changes with mild osteophytosis. No soft tissue abnormalities.  IMPRESSION: Mild lateral compartment osteoarthritis.  07/28/22 Left knee: Resulted by: Claudene Arthea HERO, DO Performed: 07/28/22 1411 - 07/28/22 1411  Accession number: [id number] Resulting lab: [facility]  Narrative: Limited muscular skeletal ultrasound was performed and interpreted by CLAUDENE ARTHEA, M Limited ultrasound continues to show the patient does have some hypoechoic changes in the patellofemoral joint noted.  Some degenerative changes of the meniscus noted. Impression: Improvement in inflammation but still continue effusion    PATIENT SURVEYS:  Eval: FOTO 38, predicted 55 05/09/2023:  FOTO 49 12/18:  54 1/10: 55 taken out of FOTO  COGNITION: Overall cognitive status: Within functional limits for tasks assessed     SENSATION: WFL   POSTURE:  slight knee valgus bilaterally  PALPATION: Mod crepitus noted on seated open chain flexion/extension  LOWER EXTREMITY ROM:  WNL LOWER EXTREMITY MMT:  Generally 4/5 with exception of bilateral knee flexion 4-/5 and bilateral hip abd 4-/5 and bilateral hip ER 3+/5  LOWER EXTREMITY SPECIAL TESTS:  Knee special tests: Patellafemoral grind test: positive   FUNCTIONAL TESTS:  Eval: 5 times sit to stand: 24 sec Timed up and go (TUG): 14.5 sec  05/03/2023: 5 times sit to stand:  18.36 sec without UE use Timed up and go (TUG): 8.00 sec 3 minute walk test: 612 ft without increased pain with RPE of 3/10  12/18 5x 20.16 (painful secondary to recent fall and prolonged standing yesterday) 6 MWT 1140 3/10 knee pain, back pain  3 MWT 550  feet  06/24/23: 5x STS painful: 18.84 no hands TUG 11 sec  07/11/2023: 5 times sit to stand: 13.80 sec with 4/10 knee right pain and dyspnea 6 minute walk test:  1192 ft with RPE of 5/10 with 3/10 right knee pain and back pain   GAIT: Distance walked: 50 feet Assistive device utilized: None Level of assistance: Complete Independence Comments: antalgic, slow, right LE externally rotated, guarded, short step length   TODAY'S TREATMENT:    DATE: 07/21/2023 Bike level 2 x 8 minutes with PT present to discuss status Standing hamstring stretch at stairs 2x20 sec bilat Therapeutic activity: standing, walking, lifting ascending curbs and steps Alt LE toe touch to 6 step without UE support 30 reps no UE use; 5# weight hold with step taps 5x right/left  Hip hinge to knee level with pair of 5# weights x10 Neuromuscular re-ed for balance: Side stepping on AirEx beam in parallel bars down and back x3  Tandem gait on AirEx beam in parallel bars down  and back x3  Standing marching on foam pad with UE support for balance x1 min Standing on foam pad side to side weight shift 1 min Standing on foam pad staggered stance weight shift 1 min Leg Press (seat at 7) 90# 2x10 Single leg press 45# x10 bilat Therapeutic activity for ascending/descending steps and curbs, walking, standing DATE: 07/18/2023 Bike level 2 x 7 minutes with PT present to discuss status Standing hamstring stretch at stairs 2x20 sec bilat Alt LE toe touch to 6 step without UE support 2x30 sec no UE use Hip hinge to knee level with pair of 5# weights 2x10 Side stepping on AirEx beam in parallel bars down and back x3 (with UE use as needed fpr balance) Tandem gait on AirEx beam in parallel bars down and back x3 (with UE use as needed for balance) Standing marching on foam pad with UE support for balance x1 min Leg Press (seat at 7) 90# 2x10 Single leg press 45# x10 bilat   DATE: 07/14/2023 Bike level 2 x 5 minutes with PT present  to discuss status FWD step ups with 4 step and UE assist x10 single arm support Hip hinge to knee level with pair of 5# weights 8x Leg Press (seat at 7) 90# 10x Single leg press 45# x10 bilat Resisted backward walk with aqua power cord 10x Alt LE toe touch to 4 step without UE support x 10  Nu-Step seat 8, arms 8 (seems to be the best settings)  L 3 (green machine) 10 min   DATE: 07/11/2023 Bike level 2 x 7 minutes with PT present to discuss status 5 times sit to stand: 13.80 sec with 4/10 knee right pain and dyspnea 6 minute walk test:  1192 ft with RPE of 5/10 with 3/10 right knee pain and back pain FWD step ups with 4 step and UE assist x7 bilat Lateral step ups with 4 step and UE assist x7 each bilat Leg Press (seat at 7) 90# 2x10 Single leg press 45# x10 bilat 4 way resisted gait with 10# cable pulley x5 each direction Alt LE toe touch to 6 step without UE support x 30 sec no UE use   PATIENT EDUCATION:  Education details: Initiated HEP Person educated: Patient Education method: Programmer, Multimedia, Facilities Manager, Verbal cues, and Handouts Education comprehension: verbalized understanding, returned demonstration, and verbal cues required  HOME EXERCISE PROGRAM: Access Code: [email or web address] URL: [email or web address] Date: 04/12/2023 Prepared by: Glade Pesa  Exercises - Long Sitting Quad Set  - 1 x daily - 7 x weekly - 3 sets - 10 reps - Supine Knee Extension Strengthening  - 1 x daily - 7 x weekly - 3 sets - 10 reps - Seated Long Arc Quad  - 1 x daily - 7 x weekly - 3 sets - 10 reps - Straight Leg Raise  - 1 x daily - 7 x weekly - 1 sets - 10 reps - Clamshell  - 1 x daily - 7 x weekly - 1 sets - 10 reps - Seated Hamstring Curl with Anchored Resistance  - 1 x daily - 7 x weekly - 1 sets - 10 reps - Standing Heel Raise with Support  - 1 x daily - 7 x weekly - 1 sets - 10 reps  ASSESSMENT:  CLINICAL IMPRESSION: I like this balance pad it really works my legs and  works me (aerobically). She is interested in getting a foam pad for home use.  Therapist monitoring pain level and providing verbal  cues for best exercise technique.  Will continue to finalize HEP.   OBJECTIVE IMPAIRMENTS: Abnormal gait, decreased balance, decreased knowledge of use of DME, decreased mobility, difficulty walking, decreased strength, increased edema, increased fascial restrictions, increased muscle spasms, impaired flexibility, postural dysfunction, obesity, and pain.   ACTIVITY LIMITATIONS: lifting, bending, sitting, standing, squatting, sleeping, stairs, transfers, bed mobility, bathing, toileting, dressing, and caring for others  PARTICIPATION LIMITATIONS: meal prep, cleaning, laundry, driving, shopping, community activity, yard work, and church  PERSONAL FACTORS: Fitness, Time since onset of injury/illness/exacerbation, and 1-2 comorbidities: anxiety and obesity  are also affecting patient's functional outcome.   REHAB POTENTIAL: Fair due to possible mechanical meniscus injury or joint deterioration  CLINICAL DECISION MAKING: Evolving/moderate complexity  EVALUATION COMPLEXITY: Moderate   GOALS: Goals reviewed with patient? Yes  SHORT TERM GOALS: Target date: 05/04/2023   Patient will be independent with initial HEP  Baseline: Goal status: MET on 05/03/2023  2.  Pain report to be no greater than 4/10  Baseline:  Goal status: Met on 06/30/2023   LONG TERM GOALS: Target date: 07/27/2023   Patient to be independent with advanced HEP  Baseline:  Goal status: Ongoing  2.  Patient to report pain no greater than 2/10  Baseline:  Goal status: Ongoing  3.  Patient to be able to bend, stoop and squat with pain no greater than 2/10  Baseline:  Goal status: Ongoing  4.  Patient to be able to ascend and descend steps without pain or no greater than 2/10  Baseline:  Goal status: Ongoing  5.  Patient to be able to sleep through the night  Baseline:  Goal  status: met 1/21  6.  Patient to report 85% improvement in overall symptoms  Baseline:  Goal status: Ongoing 7.  Six minute walk test 1200 feet indicating improved gait speed and strength.  Ongoing  8. Able to rise sit to stand from a standard chair with minimal knee pain (5x STS <17 sec) Partially met (met for time, not for pain)   PLAN:  PT FREQUENCY: 1-2x/week  PT DURATION: 8 weeks  PLANNED INTERVENTIONS: 97110-Therapeutic exercises, 97530- Therapeutic activity, 97112- Neuromuscular re-education, 97535- Self Care, 02859- Manual therapy, [phone]- Gait training, [phone]- Aquatic Therapy, [phone]- Splinting, 97014- Electrical stimulation (unattended), [id number]- Electrical stimulation (manual), 97016- Vasopneumatic device, [id number]- Ultrasound, [id number]- Ionotophoresis 4mg /ml Dexamethasone, Patient/Family education, Balance training, Stair training, Taping, Dry Needling, Joint mobilization, Scar mobilization, Compression bandaging, Vestibular training, Visual/preceptual remediation/compensation, Cryotherapy, and Moist heat  PLAN FOR NEXT SESSION:Airex foam balance;  resisted walking; leg press; quad, HS, glute strengthening; finalize HEP  Glade Pesa, PT 07/21/23 6:42 PM Phone: [phone] Fax: [phone]  [facility] [address] [place], [place] [address] Phone # [phone] Fax [phone]

## 2023-07-26 ENCOUNTER — Ambulatory Visit: Payer: PPO | Admitting: Physical Therapy

## 2023-07-26 DIAGNOSIS — M6281 Muscle weakness (generalized): Secondary | ICD-10-CM

## 2023-07-26 DIAGNOSIS — M25561 Pain in right knee: Secondary | ICD-10-CM | POA: Diagnosis not present

## 2023-07-26 DIAGNOSIS — R262 Difficulty in walking, not elsewhere classified: Secondary | ICD-10-CM

## 2023-07-26 DIAGNOSIS — M25661 Stiffness of right knee, not elsewhere classified: Secondary | ICD-10-CM

## 2023-07-26 DIAGNOSIS — G8929 Other chronic pain: Secondary | ICD-10-CM

## 2023-07-26 NOTE — Therapy (Signed)
 OUTPATIENT PHYSICAL THERAPY TREATMENT NOTE  Patient Name: Jean Smith MRN: [id number] DOB:1951/06/06, 73 y.o., female Today's Date: 07/26/2023     END OF SESSION:  PT End of Session - 07/26/23 1112     Visit Number 29    Date for PT Re-Evaluation 07/27/23    Authorization Type [employer]    Progress Note Due on Visit 30    PT Start Time 1103    PT Stop Time 1144    PT Time Calculation (min) 41 min    Activity Tolerance Patient tolerated treatment well             Past Medical History:  Diagnosis Date   Allergy    Anxiety    Aortic regurgitation    moderate by echo 10/2020   Arthritis    Cancer (HCC)    skin   Cataract    bilateral - MD is just watching    Diverticulosis    Fuchs' endothelial dystrophy    follows with optho regularly    Gestational diabetes    Heart murmur    MVP    History of depression    HSV-2 infection    Hyperlipidemia    Hyperplastic colon polyp    Mitral valve prolapse    mild to moderate MR by echo 10/2020   PVC's (premature ventricular contractions)    intol of BB, sxc palpitations r/t stress   RVOT ventricular tachycardia/PVCs    EP eval 01/2013 for freq PVCs   Past Surgical History:  Procedure Laterality Date   CESAREAN SECTION     x 3   COLONOSCOPY     KNEE SURGERY Right    MANDIBLE SURGERY     right side in front of ear   MOHS SURGERY  2021   POLYPECTOMY     WISDOM TOOTH EXTRACTION     Patient Active Problem List   Diagnosis Date Noted   AC (acromioclavicular) arthritis 05/28/2022   Mitral valve prolapse    Aortic regurgitation    NSVT (nonsustained ventricular tachycardia) (HCC) 09/25/2020   Diabetes mellitus type 2 with complications (HCC) 08/27/2020   Chronic pain of both shoulders 05/05/2018   Other fatigue 01/02/2018   Allergic rhinitis 01/02/2018   Routine general medical examination at a health care facility 09/01/2015   Hyperlipidemia associated with type 2 diabetes mellitus (HCC)  09/01/2015   Varicose vein 08/27/2014   Primary localized osteoarthrosis, lower leg 10/30/2013   RVOT ventricular tachycardia/PVCs 01/31/2013   Abnormal stress echo 12/11/2012    PCP: Myrlene Broker, MD  REFERRING PROVIDER: Judi Saa, DO  REFERRING DIAG: [id number] (ICD-10-CM) - Chronic pain of right knee  THERAPY DIAG:  Chronic pain of right knee  Stiffness of right knee, not elsewhere classified  Muscle weakness (generalized)  Difficulty in walking, not elsewhere classified  Rationale for Evaluation and Treatment: Rehabilitation  ONSET DATE: 03/31/2023  SUBJECTIVE:   SUBJECTIVE STATEMENT: It was a bad weekend. My mother in law passed away.  I got my foam pad but I haven't opened it.    PERTINENT HISTORY: Knee scope approx 15 years ago (right knee) PAIN:   Are you having pain? Yes: NPRS scale: 3/10 Pain location: bil knees right > left Pain description: aching, sharp Aggravating factors: walking, standing, bending,stooping, squatting Relieving factors: rest, meds  PRECAUTIONS: Fall  RED FLAGS: None   WEIGHT BEARING RESTRICTIONS: No  FALLS:  Has patient fallen in last 6 months? No  LIVING ENVIRONMENT: Lives with:  lives with their spouse Lives in: House/apartment Stairs: Yes: Internal: 12 steps; on right going up and External: 5 steps; on right going up Has following equipment at home: None  OCCUPATION: retired  PLOF: Independent, Independent with basic ADLs, Independent with household mobility without device, Independent with community mobility without device, Independent with homemaking with ambulation, Independent with gait, and Independent with transfers  PATIENT GOALS: to be able to walk and get up and down from a chair and do her routine daily activities without pain and without her knee giving way  NEXT MD VISIT: prn  OBJECTIVE:  Note: Objective measures were completed at Evaluation unless otherwise noted.  DIAGNOSTIC  FINDINGS:  05/30/22: RIGHT KNEE 3 VIEWS  COMPARISON:  Bilateral knee radiographs May 19th, 2015  FINDINGS: No acute fracture or dislocation. No joint effusion. Mild lateral compartment subchondral sclerosis and cystic changes with mild osteophytosis. No soft tissue abnormalities.  IMPRESSION: Mild lateral compartment osteoarthritis.  07/28/22 Left knee: Resulted by: Judi Saa, DO Performed: 07/28/22 1411 - 07/28/22 1411  Accession number: [id number] Resulting lab: [facility]  Narrative: Limited muscular skeletal ultrasound was performed and interpreted by Antoine Primas, M Limited ultrasound continues to show the patient does have some hypoechoic changes in the patellofemoral joint noted.  Some degenerative changes of the meniscus noted. Impression: Improvement in inflammation but still continue effusion    PATIENT SURVEYS:  Eval: FOTO 38, predicted 55 05/09/2023:  FOTO 49 12/18:  54 1/10: 55 taken out of FOTO  COGNITION: Overall cognitive status: Within functional limits for tasks assessed     SENSATION: WFL   POSTURE:  slight knee valgus bilaterally  PALPATION: Mod crepitus noted on seated open chain flexion/extension  LOWER EXTREMITY ROM:  WNL LOWER EXTREMITY MMT:  Generally 4/5 with exception of bilateral knee flexion 4-/5 and bilateral hip abd 4-/5 and bilateral hip ER 3+/5  LOWER EXTREMITY SPECIAL TESTS:  Knee special tests: Patellafemoral grind test: positive   FUNCTIONAL TESTS:  Eval: 5 times sit to stand: 24 sec Timed up and go (TUG): 14.5 sec  05/03/2023: 5 times sit to stand:  18.36 sec without UE use Timed up and go (TUG): 8.00 sec 3 minute walk test: 612 ft without increased pain with RPE of 3/10  12/18 5x 20.16 (painful secondary to recent fall and prolonged standing yesterday) 6 MWT 1140 3/10 knee pain, back pain  3 MWT 550 feet  06/24/23: 5x STS painful: 18.84 no hands TUG 11 sec  07/11/2023: 5 times sit to stand:  13.80 sec with 4/10 knee right pain and dyspnea 6 minute walk test:  1192 ft with RPE of 5/10 with 3/10 right knee pain and back pain   GAIT: Distance walked: 50 feet Assistive device utilized: None Level of assistance: Complete Independence Comments: antalgic, slow, right LE externally rotated, guarded, short step length   TODAY'S TREATMENT:    DATE: 07/26/2023 Bike level 2 x 8 minutes with PT present to discuss status Therapeutic activity: standing, walking, lifting ascending curbs and steps Leg Press (seat at 7) 90# 2x10 Single leg press 45# x10 bilat 5# weight single side hold with step taps 5x right/left  Hip hinge to knee level with pair of 5# weights x10 Neuromuscular re-ed for balance: Side stepping on AirEx beam in parallel bars down and back x5 Tandem gait on AirEx beam in parallel bars down and back x5  Standing marching on foam pad with UE support for balance x1 min Standing on foam pad side to  side weight shift 1 min Standing on foam pad staggered stance weight shift 1 min Therapeutic activity for ascending/descending steps and curbs, walking, standing  DATE: 07/21/2023 Bike level 2 x 8 minutes with PT present to discuss status Standing hamstring stretch at stairs 2x20 sec bilat Therapeutic activity: standing, walking, lifting ascending curbs and steps Alt LE toe touch to 6" step without UE support 30 reps no UE use; 5# weight hold with step taps 5x right/left  Hip hinge to knee level with pair of 5# weights x10 Neuromuscular re-ed for balance: Side stepping on AirEx beam in parallel bars down and back x3  Tandem gait on AirEx beam in parallel bars down and back x3  Standing marching on foam pad with UE support for balance x1 min Standing on foam pad side to side weight shift 1 min Standing on foam pad staggered stance weight shift 1 min Leg Press (seat at 7) 90# 2x10 Single leg press 45# x10 bilat Therapeutic activity for ascending/descending steps and curbs,  walking, standing DATE: 07/18/2023 Bike level 2 x 7 minutes with PT present to discuss status Standing hamstring stretch at stairs 2x20 sec bilat Alt LE toe touch to 6" step without UE support 2x30 sec no UE use Hip hinge to knee level with pair of 5# weights 2x10 Side stepping on AirEx beam in parallel bars down and back x3 (with UE use as needed fpr balance) Tandem gait on AirEx beam in parallel bars down and back x3 (with UE use as needed for balance) Standing marching on foam pad with UE support for balance x1 min Leg Press (seat at 7) 90# 2x10 Single leg press 45# x10 bilat   DATE: 07/14/2023 Bike level 2 x 5 minutes with PT present to discuss status FWD step ups with 4" step and UE assist x10 single arm support Hip hinge to knee level with pair of 5# weights 8x Leg Press (seat at 7) 90# 10x Single leg press 45# x10 bilat Resisted backward walk with aqua power cord 10x Alt LE toe touch to 4" step without UE support x 10  Nu-Step seat 8, arms 8 (seems to be the best settings)  L 3 (green machine) 10 min     PATIENT EDUCATION:  Education details: Initiated HEP Person educated: Patient Education method: Programmer, multimedia, Facilities manager, Verbal cues, and Handouts Education comprehension: verbalized understanding, returned demonstration, and verbal cues required  HOME EXERCISE PROGRAM: Access Code: [email or web address] URL: [email or web address] Date: 04/12/2023 Prepared by: Lavinia Sharps  Exercises - Long Sitting Quad Set  - 1 x daily - 7 x weekly - 3 sets - 10 reps - Supine Knee Extension Strengthening  - 1 x daily - 7 x weekly - 3 sets - 10 reps - Seated Long Arc Quad  - 1 x daily - 7 x weekly - 3 sets - 10 reps - Straight Leg Raise  - 1 x daily - 7 x weekly - 1 sets - 10 reps - Clamshell  - 1 x daily - 7 x weekly - 1 sets - 10 reps - Seated Hamstring Curl with Anchored Resistance  - 1 x daily - 7 x weekly - 1 sets - 10 reps - Standing Heel Raise with Support  - 1 x daily - 7  x weekly - 1 sets - 10 reps  ASSESSMENT:  CLINICAL IMPRESSION: Fewer cues needed for exercise technique.  She is excited to have an Airex foam at home now and likes these ex's since they are not  painful.  Will check progress toward goals and objective re tests next visit.  Anticipate readiness for discharge to HEP and gym program (pt is familiar with [employer]).   OBJECTIVE IMPAIRMENTS: Abnormal gait, decreased balance, decreased knowledge of use of DME, decreased mobility, difficulty walking, decreased strength, increased edema, increased fascial restrictions, increased muscle spasms, impaired flexibility, postural dysfunction, obesity, and pain.   ACTIVITY LIMITATIONS: lifting, bending, sitting, standing, squatting, sleeping, stairs, transfers, bed mobility, bathing, toileting, dressing, and caring for others  PARTICIPATION LIMITATIONS: meal prep, cleaning, laundry, driving, shopping, community activity, yard work, and church  PERSONAL FACTORS: Fitness, Time since onset of injury/illness/exacerbation, and 1-2 comorbidities: anxiety and obesity  are also affecting patient's functional outcome.   REHAB POTENTIAL: Fair due to possible mechanical meniscus injury or joint deterioration  CLINICAL DECISION MAKING: Evolving/moderate complexity  EVALUATION COMPLEXITY: Moderate   GOALS: Goals reviewed with patient? Yes  SHORT TERM GOALS: Target date: 05/04/2023   Patient will be independent with initial HEP  Baseline: Goal status: MET on 05/03/2023  2.  Pain report to be no greater than 4/10  Baseline:  Goal status: Met on 06/30/2023   LONG TERM GOALS: Target date: 07/27/2023   Patient to be independent with advanced HEP  Baseline:  Goal status: Ongoing  2.  Patient to report pain no greater than 2/10  Baseline:  Goal status: Ongoing  3.  Patient to be able to bend, stoop and squat with pain no greater than 2/10  Baseline:  Goal status: Ongoing  4.  Patient to be able to  ascend and descend steps without pain or no greater than 2/10  Baseline:  Goal status: Ongoing  5.  Patient to be able to sleep through the night  Baseline:  Goal status: met 1/21  6.  Patient to report 85% improvement in overall symptoms  Baseline:  Goal status: Ongoing 7.  Six minute walk test 1200 feet indicating improved gait speed and strength.  Ongoing  8. Able to rise sit to stand from a standard chair with minimal knee pain (5x STS <17 sec) Partially met (met for time, not for pain)   PLAN:  PT FREQUENCY: 1-2x/week  PT DURATION: 8 weeks  PLANNED INTERVENTIONS: 97110-Therapeutic exercises, 97530- Therapeutic activity, [id number]- Neuromuscular re-education, 97535- Self Care, 16109- Manual therapy, [id number]- Gait training, [phone]- Aquatic Therapy, [phone]- Splinting, 97014- Electrical stimulation (unattended), [id number]- Electrical stimulation (manual), 97016- Vasopneumatic device, [id number]- Ultrasound, [id number]- Ionotophoresis 4mg /ml Dexamethasone, Patient/Family education, Balance training, Stair training, Taping, Dry Needling, Joint mobilization, Scar mobilization, Compression bandaging, Vestibular training, Visual/preceptual remediation/compensation, Cryotherapy, and Moist heat  PLAN FOR NEXT SESSION:check progress toward goals; Airex foam balance;  resisted walking; leg press; quad, HS, glute strengthening; finalize HEP  Lavinia Sharps, PT 07/26/23 11:41 AM Phone: [phone] Fax: [phone]  [facility] [address] [place], [place] [address] Phone # [phone] Fax [phone]

## 2023-07-27 ENCOUNTER — Ambulatory Visit: Payer: PPO | Admitting: Physical Therapy

## 2023-07-27 DIAGNOSIS — M6281 Muscle weakness (generalized): Secondary | ICD-10-CM

## 2023-07-27 DIAGNOSIS — M25561 Pain in right knee: Secondary | ICD-10-CM | POA: Diagnosis not present

## 2023-07-27 DIAGNOSIS — G8929 Other chronic pain: Secondary | ICD-10-CM

## 2023-07-27 DIAGNOSIS — R262 Difficulty in walking, not elsewhere classified: Secondary | ICD-10-CM

## 2023-07-27 DIAGNOSIS — M25661 Stiffness of right knee, not elsewhere classified: Secondary | ICD-10-CM

## 2023-07-27 NOTE — Therapy (Signed)
OUTPATIENT PHYSICAL THERAPY TREATMENT NOTE/DISCHARGE SUMMARY  Patient Name: Jean Smith MRN: [id number] DOB:07-31-1950, 73 y.o., female Today's Date: 07/27/2023   Progress Note Reporting Period 1/10 to 07/27/23  See note below for Objective Data and Assessment of Progress/Goals.      END OF SESSION:  PT End of Session - 07/27/23 1154     Visit Number 30    Date for PT Re-Evaluation 07/27/23    Authorization Type [employer]    Progress Note Due on Visit 30    PT Start Time 1150    PT Stop Time 1230    PT Time Calculation (min) 40 min    Activity Tolerance Patient tolerated treatment well             Past Medical History:  Diagnosis Date   Allergy    Anxiety    Aortic regurgitation    moderate by echo 10/2020   Arthritis    Cancer (HCC)    skin   Cataract    bilateral - MD is just watching    Diverticulosis    Fuchs' endothelial dystrophy    follows with optho regularly    Gestational diabetes    Heart murmur    MVP    History of depression    HSV-2 infection    Hyperlipidemia    Hyperplastic colon polyp    Mitral valve prolapse    mild to moderate MR by echo 10/2020   PVC's (premature ventricular contractions)    intol of BB, sxc palpitations r/t stress   RVOT ventricular tachycardia/PVCs    EP eval 01/2013 for freq PVCs   Past Surgical History:  Procedure Laterality Date   CESAREAN SECTION     x 3   COLONOSCOPY     KNEE SURGERY Right    MANDIBLE SURGERY     right side in front of ear   MOHS SURGERY  2021   POLYPECTOMY     WISDOM TOOTH EXTRACTION     Patient Active Problem List   Diagnosis Date Noted   AC (acromioclavicular) arthritis 05/28/2022   Mitral valve prolapse    Aortic regurgitation    NSVT (nonsustained ventricular tachycardia) (HCC) 09/25/2020   Diabetes mellitus type 2 with complications (HCC) 08/27/2020   Chronic pain of both shoulders 05/05/2018   Other fatigue 01/02/2018   Allergic rhinitis 01/02/2018    Routine general medical examination at a health care facility 09/01/2015   Hyperlipidemia associated with type 2 diabetes mellitus (HCC) 09/01/2015   Varicose vein 08/27/2014   Primary localized osteoarthrosis, lower leg 10/30/2013   RVOT ventricular tachycardia/PVCs 01/31/2013   Abnormal stress echo 12/11/2012    PCP: Myrlene Broker, MD  REFERRING PROVIDER: Judi Saa, DO  REFERRING DIAG: [id number] (ICD-10-CM) - Chronic pain of right knee  THERAPY DIAG:  Chronic pain of right knee  Stiffness of right knee, not elsewhere classified  Muscle weakness (generalized)  Difficulty in walking, not elsewhere classified  Rationale for Evaluation and Treatment: Rehabilitation  ONSET DATE: 03/31/2023  SUBJECTIVE:   SUBJECTIVE STATEMENT: I like the foam pad. The pain is still there in my knee but I feel stronger.   I'm going to keep working on my balance and strength.   PERTINENT HISTORY: Knee scope approx 15 years ago (right knee) PAIN:   Are you having pain? Yes: NPRS scale: 2-3/10 Pain location: bil knees right > left Pain description: aching, sharp Aggravating factors: walking, standing, bending,stooping, squatting Relieving factors: rest, meds  PRECAUTIONS: Fall  RED FLAGS: None   WEIGHT BEARING RESTRICTIONS: No  FALLS:  Has patient fallen in last 6 months? No  LIVING ENVIRONMENT: Lives with: lives with their spouse Lives in: House/apartment Stairs: Yes: Internal: 12 steps; on right going up and External: 5 steps; on right going up Has following equipment at home: None  OCCUPATION: retired  PLOF: Independent, Independent with basic ADLs, Independent with household mobility without device, Independent with community mobility without device, Independent with homemaking with ambulation, Independent with gait, and Independent with transfers  PATIENT GOALS: to be able to walk and get up and down from a chair and do her routine daily activities  without pain and without her knee giving way  NEXT MD VISIT: prn  OBJECTIVE:  Note: Objective measures were completed at Evaluation unless otherwise noted.  DIAGNOSTIC FINDINGS:  05/30/22: RIGHT KNEE 3 VIEWS  COMPARISON:  Bilateral knee radiographs May 19th, 2015  FINDINGS: No acute fracture or dislocation. No joint effusion. Mild lateral compartment subchondral sclerosis and cystic changes with mild osteophytosis. No soft tissue abnormalities.  IMPRESSION: Mild lateral compartment osteoarthritis.  07/28/22 Left knee: Resulted by: Judi Saa, DO Performed: 07/28/22 1411 - 07/28/22 1411  Accession number: [id number] Resulting lab: [facility]  Narrative: Limited muscular skeletal ultrasound was performed and interpreted by Antoine Primas, M Limited ultrasound continues to show the patient does have some hypoechoic changes in the patellofemoral joint noted.  Some degenerative changes of the meniscus noted. Impression: Improvement in inflammation but still continue effusion    PATIENT SURVEYS:  Eval: FOTO 38, predicted 55 05/09/2023:  FOTO 49 12/18:  54 1/10: 55 taken out of FOTO  COGNITION: Overall cognitive status: Within functional limits for tasks assessed     SENSATION: WFL   POSTURE:  slight knee valgus bilaterally  PALPATION: Mod crepitus noted on seated open chain flexion/extension  LOWER EXTREMITY ROM:  WNL LOWER EXTREMITY MMT:  Generally 4/5 with exception of bilateral knee flexion 4-/5 and bilateral hip abd 4-/5 and bilateral hip ER 3+/5  LOWER EXTREMITY SPECIAL TESTS:  Knee special tests: Patellafemoral grind test: positive   FUNCTIONAL TESTS:  Eval: 5 times sit to stand: 24 sec Timed up and go (TUG): 14.5 sec  05/03/2023: 5 times sit to stand:  18.36 sec without UE use Timed up and go (TUG): 8.00 sec 3 minute walk test: 612 ft without increased pain with RPE of 3/10  12/18 5x 20.16 (painful secondary to recent fall and  prolonged standing yesterday) 6 MWT 1140 3/10 knee pain, back pain  3 MWT 550 feet  06/24/23: 5x STS painful: 18.84 no hands TUG 11 sec  07/11/2023: 5 times sit to stand: 13.80 sec with 4/10 knee right pain and dyspnea 6 minute walk test:  1192 ft with RPE of 5/10 with 3/10 right knee pain and back pain  2/12: 5x STS  13.14  no hands knee pain 5/10 6 MWT: 1218  right hip pain and hip pain RPE 5/10   GAIT: Distance walked: 50 feet Assistive device utilized: None Level of assistance: Complete Independence Comments: antalgic, slow, right LE externally rotated, guarded, short step length   TODAY'S TREATMENT:    ATE: 07/27/2023 Bike level 2 x 8 minutes with PT present to discuss status Therapeutic activity: standing, walking, lifting ascending curbs and steps 6 MWT 5x STS Leg Press (seat at 7) 90# 2x10 Single leg press 45# x10 bilat Discussion of HEP continuation and gym program ([employer])  DATE: 07/26/2023 Bike  level 2 x 8 minutes with PT present to discuss status Therapeutic activity: standing, walking, lifting ascending curbs and steps Leg Press (seat at 7) 90# 2x10 Single leg press 45# x10 bilat 5# weight single side hold with step taps 5x right/left  Hip hinge to knee level with pair of 5# weights x10 Neuromuscular re-ed for balance: Side stepping on AirEx beam in parallel bars down and back x5 Tandem gait on AirEx beam in parallel bars down and back x5  Standing marching on foam pad with UE support for balance x1 min Standing on foam pad side to side weight shift 1 min Standing on foam pad staggered stance weight shift 1 min Therapeutic activity for ascending/descending steps and curbs, walking, standing  DATE: 07/21/2023 Bike level 2 x 8 minutes with PT present to discuss status Standing hamstring stretch at stairs 2x20 sec bilat Therapeutic activity: standing, walking, lifting ascending curbs and steps Alt LE toe touch to 6" step without UE support 30 reps no UE  use; 5# weight hold with step taps 5x right/left  Hip hinge to knee level with pair of 5# weights x10 Neuromuscular re-ed for balance: Side stepping on AirEx beam in parallel bars down and back x3  Tandem gait on AirEx beam in parallel bars down and back x3  Standing marching on foam pad with UE support for balance x1 min Standing on foam pad side to side weight shift 1 min Standing on foam pad staggered stance weight shift 1 min Leg Press (seat at 7) 90# 2x10 Single leg press 45# x10 bilat Therapeutic activity for ascending/descending steps and curbs, walking, standing   PATIENT EDUCATION:  Education details: Initiated HEP Person educated: Patient Education method: Programmer, multimedia, Facilities manager, Verbal cues, and Handouts Education comprehension: verbalized understanding, returned demonstration, and verbal cues required  HOME EXERCISE PROGRAM: Access Code: [email or web address] URL: [email or web address] Date: 04/12/2023 Prepared by: Lavinia Sharps  Exercises - Long Sitting Quad Set  - 1 x daily - 7 x weekly - 3 sets - 10 reps - Supine Knee Extension Strengthening  - 1 x daily - 7 x weekly - 3 sets - 10 reps - Seated Long Arc Quad  - 1 x daily - 7 x weekly - 3 sets - 10 reps - Straight Leg Raise  - 1 x daily - 7 x weekly - 1 sets - 10 reps - Clamshell  - 1 x daily - 7 x weekly - 1 sets - 10 reps - Seated Hamstring Curl with Anchored Resistance  - 1 x daily - 7 x weekly - 1 sets - 10 reps - Standing Heel Raise with Support  - 1 x daily - 7 x weekly - 1 sets - 10 reps  ASSESSMENT:  CLINICAL IMPRESSION: " 50% better than I was." The patient has met the majority of rehab goals, with noted improvements in pain reduction, outcome score, ROM, strength and functional mobility.  A comprehensive HEP has been established and anticipate further improvements over time with regular performance of the program.  Recommend discharge from PT at this time.   OBJECTIVE IMPAIRMENTS: Abnormal gait,  decreased balance, decreased knowledge of use of DME, decreased mobility, difficulty walking, decreased strength, increased edema, increased fascial restrictions, increased muscle spasms, impaired flexibility, postural dysfunction, obesity, and pain.   ACTIVITY LIMITATIONS: lifting, bending, sitting, standing, squatting, sleeping, stairs, transfers, bed mobility, bathing, toileting, dressing, and caring for others  PARTICIPATION LIMITATIONS: meal prep, cleaning, laundry, driving, shopping, community activity, yard work, and church  PERSONAL FACTORS:  Fitness, Time since onset of injury/illness/exacerbation, and 1-2 comorbidities: anxiety and obesity  are also affecting patient's functional outcome.   REHAB POTENTIAL: Fair due to possible mechanical meniscus injury or joint deterioration  CLINICAL DECISION MAKING: Evolving/moderate complexity  EVALUATION COMPLEXITY: Moderate   GOALS: Goals reviewed with patient? Yes  SHORT TERM GOALS: Target date: 05/04/2023   Patient will be independent with initial HEP  Baseline: Goal status: MET on 05/03/2023  2.  Pain report to be no greater than 4/10  Baseline:  Goal status: Met on 06/30/2023   LONG TERM GOALS: Target date: 07/27/2023   Patient to be independent with advanced HEP  Baseline:  Goal status: met 2/12  2.  Patient to report pain no greater than 2/10  Baseline:  Goal status: not met  3.  Patient to be able to bend, stoop and squat with pain no greater than 2/10  Baseline:  Goal status:  Not met 4.  Patient to be able to ascend and descend steps without pain or no greater than 2/10  Baseline:  Goal status: not met  5.  Patient to be able to sleep through the night  Baseline:  Goal status: met 1/21  6.  Patient to report 85% improvement in overall symptoms  Baseline:  Goal status: partially met  7.  Six minute walk test 1200 feet indicating improved gait speed and strength.  Met 2/12  8. Able to rise sit to stand  from a standard chair with minimal knee pain (5x STS <17 sec) Met 2/12 Met 2/12  PLAN: PHYSICAL THERAPY DISCHARGE SUMMARY  Visits from Start of Care: 30  Current functional level related to goals / functional outcomes: See clinical impressions above   Remaining deficits: As above   Education / Equipment: HEP   Patient agrees to discharge. Patient goals were partially met. Patient is being discharged due to maximized rehab potential.    Lavinia Sharps, PT 07/27/23 1:54 PM Phone: [phone] Fax: [phone]   [facility] [address] [place], [place] [address] Phone # [phone] Fax [phone]

## 2023-08-08 NOTE — Progress Notes (Unsigned)
 [facility] [address] [place] [address] Phone: [phone] Subjective:   [name], am serving as a scribe for Dr. Antoine Primas.  I'm seeing this patient by the request  of:  Myrlene Broker, MD  CC: right knee pain   ION:[email or web address]  06/02/2023 Does have degenerative arthritic changes noted of this right knee.  Patient did bring in her new custom OA stability brace but has not been sized yet by vendor.  Discussed that she does need to contact them at the moment.  Will redo physical therapy referral to allow her daughter to continue with patient having fall recently and the bruising.  Patient should do well with this for another 6 to 12 weeks. Follow-up with me at the end of that to further evaluate     Updated 08/10/2023 Jean Smith is a 72 y.o. female coming in with complaint of R knee pain. Patient states that she is doing better. Finished PT but feels like she was just getting started. Has more muscular endurance but still feels weak when ascending stairs. Pain radiates from knee up into the quad.         Past Medical History:  Diagnosis Date   Allergy    Anxiety    Aortic regurgitation    moderate by echo 10/2020   Arthritis    Cancer (HCC)    skin   Cataract    bilateral - MD is just watching    Diverticulosis    Fuchs' endothelial dystrophy    follows with optho regularly    Gestational diabetes    Heart murmur    MVP    History of depression    HSV-2 infection    Hyperlipidemia    Hyperplastic colon polyp    Mitral valve prolapse    mild to moderate MR by echo 10/2020   PVC's (premature ventricular contractions)    intol of BB, sxc palpitations r/t stress   RVOT ventricular tachycardia/PVCs    EP eval 01/2013 for freq PVCs   Past Surgical History:  Procedure Laterality Date   CESAREAN SECTION     x 3   COLONOSCOPY     KNEE SURGERY Right    MANDIBLE SURGERY     right side in front of ear   MOHS  SURGERY  2021   POLYPECTOMY     WISDOM TOOTH EXTRACTION     Social History   Socioeconomic History   Marital status: Married    Spouse name: Not on file   Number of children: 3   Years of education: Not on file   Highest education level: Some college, no degree  Occupational History   Not on file  Tobacco Use   Smoking status: Former    Current packs/day: 0.00    Types: Cigarettes    Quit date: 06/15/1983    Years since quitting: 40.1   Smokeless tobacco: Never  Vaping Use   Vaping status: Never Used  Substance and Sexual Activity   Alcohol use: Not Currently    Comment: occasional   Drug use: No   Sexual activity: Not Currently    Birth control/protection: Post-menopausal  Other Topics Concern   Not on file  Social History Narrative   Regular exercise: yes   Caffeine use: none   Social Drivers of Corporate investment banker Strain: Low Risk  (03/23/2023)   Overall Financial Resource Strain (CARDIA)    Difficulty of Paying Living Expenses:  Not hard at all  Food Insecurity: No Food Insecurity (03/23/2023)   Hunger Vital Sign    Worried About Running Out of Food in the Last Year: Never true    Ran Out of Food in the Last Year: Never true  Transportation Needs: No Transportation Needs (03/23/2023)   PRAPARE - Administrator, Civil Service (Medical): No    Lack of Transportation (Non-Medical): No  Physical Activity: Unknown (03/23/2023)   Exercise Vital Sign    Days of Exercise per Week: Patient declined    Minutes of Exercise per Session: Not on file  Stress: Stress Concern Present (03/23/2023)   [employer] of Occupational Health - Occupational Stress Questionnaire    Feeling of Stress : To some extent  Social Connections: Socially Integrated (03/23/2023)   Social Connection and Isolation Panel [NHANES]    Frequency of Communication with Friends and Family: Three times a week    Frequency of Social Gatherings with Friends and Family: Patient declined     Attends Religious Services: 1 to 4 times per year    Active Member of [employer] or Organizations: Yes    Attends Banker Meetings: 1 to 4 times per year    Marital Status: Married   Allergies  Allergen Reactions   Latex Swelling   Other     Grass, local trees, cats and dogs   Pollen Extract    Family History  Problem Relation Age of Onset   Colon cancer Mother        dx'd in her 27's-- stage 2    Hypertension Father    Heart disease Father    Hyperlipidemia Father    Thyroid disease Sister    Colon polyps Sister    Colon polyps Sister    Colon cancer Maternal Grandmother    Colon polyps Daughter    Alcohol abuse Other    Rectal cancer Neg Hx    Stomach cancer Neg Hx    Esophageal cancer Neg Hx      Current Outpatient Medications (Cardiovascular):    propranolol (INDERAL) 10 MG tablet, Take 10 mg by mouth as needed (increased HR and skipped beats).   rosuvastatin (CRESTOR) 5 MG tablet, Take 1 tablet (5 mg total) by mouth daily.  Current Outpatient Medications (Respiratory):    loratadine (CLARITIN) 10 MG tablet, Take 10 mg by mouth daily as needed for allergies.    Current Outpatient Medications (Other):    ALPRAZolam (XANAX) 0.25 MG tablet, TAKE 1 TABLET BY MOUTH EVERY DAY AS NEEDED FOR ANXIETY   ascorbic acid (VITAMIN C) 500 MG tablet, Take 500 mg by mouth every 3 (three) days. [phone] MG   b complex vitamins capsule, Take 1 capsule by mouth every 3 (three) days.   Cholecalciferol (VITAMIN D-3) 125 MCG (5000 UT) TABS, Take 1 tablet by mouth every 3 (three) days.   Continuous Glucose Sensor (FREESTYLE LIBRE 3 PLUS SENSOR) MISC, 1 each by Does not apply route every 14 (fourteen) days.   MAGNESIUM GLUCONATE PO, Take 400 mg by mouth daily.   Turmeric (QC TUMERIC COMPLEX PO), Take by mouth every 3 (three) days.   Reviewed prior external information including notes and imaging from  primary care provider As well as notes that were available from care  everywhere and other healthcare systems.  Past medical history, social, surgical and family history all reviewed in electronic medical record.  No pertanent information unless stated regarding to the chief complaint.   Review of Systems:  No headache, visual changes, nausea, vomiting, diarrhea, constipation, dizziness, abdominal pain, skin rash, fevers, chills, night sweats, weight loss, swollen lymph nodes, body aches, joint swelling, chest pain, shortness of breath, mood changes. POSITIVE muscle aches  Objective  Blood pressure 128/82, pulse 85, height 5\' 4"  (1.626 m), weight 175 lb (79.4 kg), SpO2 96%.   General: No apparent distress alert and oriented x3 mood and affect normal, dressed appropriately.  HEENT: Pupils equal, extraocular movements intact  Respiratory: Patient's speak in full sentences and does not appear short of breath  Cardiovascular: No lower extremity edema, non tender, no erythema  Right knee exam shows crepitus noted.  Trace effusion noted of the patella space.  After informed written and verbal consent, patient was seated on exam table. Right knee was prepped with alcohol swab and utilizing anterolateral approach, patient's right knee space was injected with 4:1  marcaine 0.5%: Kenalog 40mg /dL. Patient tolerated the procedure well without immediate complications.      Impression and Recommendations:    The above documentation has been reviewed and is accurate and complete Judi Saa, DO

## 2023-08-10 ENCOUNTER — Encounter: Payer: Self-pay | Admitting: Family Medicine

## 2023-08-10 ENCOUNTER — Ambulatory Visit: Payer: PPO | Admitting: Family Medicine

## 2023-08-10 VITALS — BP 128/82 | HR 85 | Ht 64.0 in | Wt 175.0 lb

## 2023-08-10 DIAGNOSIS — M1711 Unilateral primary osteoarthritis, right knee: Secondary | ICD-10-CM | POA: Diagnosis not present

## 2023-08-10 DIAGNOSIS — G8929 Other chronic pain: Secondary | ICD-10-CM | POA: Diagnosis not present

## 2023-08-10 DIAGNOSIS — M25512 Pain in left shoulder: Secondary | ICD-10-CM

## 2023-08-10 DIAGNOSIS — M25511 Pain in right shoulder: Secondary | ICD-10-CM

## 2023-08-10 NOTE — Assessment & Plan Note (Signed)
 Pt for shoulder  Pt for balance and coordination

## 2023-08-10 NOTE — Assessment & Plan Note (Signed)
 Injection given. Discussed icing regimen  Discussed bracing. Increase activity noted. RTC in 6-8 weeks

## 2023-08-10 NOTE — Patient Instructions (Signed)
 Injected knee today Re uped the PT See me in 2 months

## 2023-08-12 ENCOUNTER — Encounter (HOSPITAL_COMMUNITY): Payer: Self-pay

## 2023-08-12 ENCOUNTER — Ambulatory Visit: Payer: Self-pay | Admitting: Internal Medicine

## 2023-08-12 ENCOUNTER — Other Ambulatory Visit: Payer: Self-pay

## 2023-08-12 ENCOUNTER — Emergency Department (HOSPITAL_COMMUNITY)
Admission: EM | Admit: 2023-08-12 | Discharge: 2023-08-13 | Disposition: A | Discharge status: Home / Self Care | Payer: PPO | Attending: Emergency Medicine | Admitting: Emergency Medicine

## 2023-08-12 ENCOUNTER — Emergency Department (HOSPITAL_COMMUNITY): Payer: PPO

## 2023-08-12 DIAGNOSIS — Z91040 Latex allergy status: Secondary | ICD-10-CM | POA: Insufficient documentation

## 2023-08-12 DIAGNOSIS — Z85828 Personal history of other malignant neoplasm of skin: Secondary | ICD-10-CM | POA: Diagnosis not present

## 2023-08-12 DIAGNOSIS — R55 Syncope and collapse: Secondary | ICD-10-CM | POA: Diagnosis not present

## 2023-08-12 DIAGNOSIS — R42 Dizziness and giddiness: Secondary | ICD-10-CM | POA: Diagnosis not present

## 2023-08-12 DIAGNOSIS — H9319 Tinnitus, unspecified ear: Secondary | ICD-10-CM | POA: Insufficient documentation

## 2023-08-12 DIAGNOSIS — I672 Cerebral atherosclerosis: Secondary | ICD-10-CM | POA: Diagnosis not present

## 2023-08-12 DIAGNOSIS — R079 Chest pain, unspecified: Secondary | ICD-10-CM | POA: Diagnosis not present

## 2023-08-12 LAB — BASIC METABOLIC PANEL
Anion gap: 9 (ref 5–15)
BUN: 23 mg/dL (ref 8–23)
CO2: 24 mmol/L (ref 22–32)
Calcium: 9.6 mg/dL (ref 8.9–10.3)
Chloride: 104 mmol/L (ref 98–111)
Creatinine, Ser: 0.78 mg/dL (ref 0.44–1.00)
GFR, Estimated: >60 mL/min (ref >60)
Glucose, Bld: 97 mg/dL (ref 70–99)
Potassium: 4.1 mmol/L (ref 3.5–5.1)
Sodium: 137 mmol/L (ref 135–145)

## 2023-08-12 LAB — TROPONIN I (HIGH SENSITIVITY)
Troponin I (High Sensitivity): 7 ng/L (ref <18)
Troponin I (High Sensitivity): 7 ng/L (ref <18)

## 2023-08-12 LAB — CBC
HCT: 44.1 % (ref 36.0–46.0)
Hemoglobin: 14.3 g/dL (ref 12.0–15.0)
MCH: 28.7 pg (ref 26.0–34.0)
MCHC: 32.4 g/dL (ref 30.0–36.0)
MCV: 88.4 fL (ref 80.0–100.0)
Platelets: 260 10*3/uL (ref 150–400)
RBC: 4.99 MIL/uL (ref 3.87–5.11)
RDW: 13.9 % (ref 11.5–15.5)
WBC: 7 10*3/uL (ref 4.0–10.5)
nRBC: 0 % (ref 0.0–0.2)

## 2023-08-12 LAB — URINALYSIS, ROUTINE W REFLEX MICROSCOPIC
Bacteria, UA: NONE SEEN
Bilirubin Urine: NEGATIVE
Glucose, UA: NEGATIVE mg/dL
Ketones, ur: NEGATIVE mg/dL
Leukocytes,Ua: NEGATIVE
Nitrite: NEGATIVE
Protein, ur: NEGATIVE mg/dL
Specific Gravity, Urine: 1.009 (ref 1.005–1.030)
pH: 5 (ref 5.0–8.0)

## 2023-08-12 LAB — CBG MONITORING, ED: Glucose-Capillary: 103 mg/dL — ABNORMAL HIGH (ref 70–99)

## 2023-08-12 MED ORDER — MECLIZINE HCL 25 MG PO TABS
25.0000 mg | ORAL_TABLET | Freq: Once | ORAL | Status: AC
  Administered 2023-08-12: 25 mg via ORAL
  Filled 2023-08-12: qty 1

## 2023-08-12 NOTE — Telephone Encounter (Signed)
 Chief Complaint: dizziness Symptoms: dizziness Frequency: intermittent since Sunday Pertinent Negatives: Patient denies CP, SOB Disposition: [x] ED /[] Urgent Care (no appt availability in office) / [] Appointment(In office/virtual)/ []  [facility]/ [] Home Care/ [] Refused Recommended Disposition /[] [facility]/ []  Follow-up with PCP Additional Notes: Pt reports 5/10 dizziness. States her first episode was last Sunday. Had a cortisone injection performed two days and and has been dizzy since. Pt reports feeling "off balance" and like "the room is spinning." States leaning forward makes dizziness worse. Deep breathing makes it better. Hx of Vtach and PVS. HR is in the 80s on pulse ox at this time. Endorses heart palpitations "sometimes." No CP, no SOB, no N/VD, no bleeding. Per protocol RN advised pt to go to the ED. Pt agreeable. Pt is currently with her son but states she ask her husband to take her. RN advised pt she needs to call 911 if she develops CP, SOB, or any worsening, to which pt verbalized understanding.    Copied from CRM [id number]. Topic: Clinical - Red Word Triage >> Aug 12, 2023  3:36 PM Suzette B wrote: [facility] that prompted transfer to Nurse Triage: patient received a cortisone injection and since then been dizziness and equilibrium is completely off not sure if this is an allergic reaction Reason for Disposition  Extra heartbeats, irregular heart beating, or heart is beating very fast  (i.e., "palpitations")  Answer Assessment - Initial Assessment Questions 1. DESCRIPTION: "Describe your dizziness."     "Off balance"  2. LIGHTHEADED: "Do you feel lightheaded?" (e.g., somewhat faint, woozy, weak upon standing)     Does not feel lightheaded  3. VERTIGO: "Do you feel like either you or the room is spinning or tilting?" (i.e. vertigo)     "Room is spinning" 4. SEVERITY: "How bad is it?"  "Do you feel like you are going to faint?" "Can you stand and walk?"   -  MILD: Feels slightly dizzy, but walking normally.   - MODERATE: Feels unsteady when walking, but not falling; interferes with normal activities (e.g., school, work).   - SEVERE: Unable to walk without falling, or requires assistance to walk without falling; feels like passing out now.      5/10 - "being very careful", "when I stand up I am very careful, I don't want to fall" 5. ONSET:  "When did the dizziness begin?"     After cortisone injection 2 days ago (2/26), states she was also dizzy Sunday AM. Pt states she had a funeral over the weekend and was very stressed.  6. AGGRAVATING FACTORS: "Does anything make it worse?" (e.g., standing, change in head position)     Leaning forward makes it worse, dizziness improves with deep breathing  7. HEART RATE: "Can you tell me your heart rate?" "How many beats in 15 seconds?"  (Note: not all patients can do this)       BP 130/73. 117/58 last night. HR 84 today. "Usually have a high HR" 8. CAUSE: "What do you think is causing the dizziness?"     Not sure. 9. RECURRENT SYMPTOM: "Have you had dizziness before?" If Yes, ask: "When was the last time?" "What happened that time?"     Dizziness one other time. Room was spinning when she was in the kitchen, " I think it's because I hold my breath. That was about 3-4 wks ago." 10. OTHER SYMPTOMS: "Do you have any other symptoms?" (e.g., fever, chest pain, vomiting, diarrhea, bleeding)  Has heart palpitations "sometimes" ("I don't really have that now"). Hx of Vtach/PVCs, states that felt like a pounding, pt states sometimes at night she feels her heart "loudly and strong" and that she takes her vital signs. No CP. Endorses headache. Denies N/V/D/bloody stools. Urinating frequently. No dysuria. Dizzy right now - "I feel a little off." Also states she is off balance because she got used to walking a different way d/t pain. No difficulty breathing. Took a propranolol today - "it calms my heart down and has helped in  the past."  Protocols used: Dizziness - Lightheadedness-A-AH

## 2023-08-12 NOTE — ED Triage Notes (Signed)
 Pt presents with intermittent dizziness described as the room spinning x 5 days. Symptoms are precipitated by leaning over. Episodes last approx a couple of minutes. Pt states she took some propranolol which makes her calmer, but is unsure if it helps with her dizziness. Pt also endorses intermittent chest tightness and palpitations. Pt denies ShOB. Pt reports a stressful couple of weeks with a death in the family.

## 2023-08-12 NOTE — ED Provider Triage Note (Signed)
 Emergency Medicine Provider Triage Evaluation Note  Jean Smith , a 73 y.o. female  was evaluated in triage.  Pt complains of dizzy. Report episodic bouts of dizziness with room spinning sensation with positional changes for the past 1-2 weeks.  Today having similar sxs but with mild chest pressure and heart palpitation.  No exertional chest pain, sob, diaphoresis, or nausea.  Report increasing stress.  No headache  Review of Systems  Positive: As above Negative: As above  Physical Exam  BP (!) 157/95 (BP Location: Left Arm)   Pulse 76   Temp 98.7 F (37.1 C) (Oral)   Resp 20   Ht 5\' 4"  (1.626 m)   Wt 77.1 kg   SpO2 99%   BMI 29.18 kg/m  Gen:   Awake, no distress   Resp:  Normal effort  MSK:   Moves extremities without difficulty  Other:    Medical Decision Making  Medically screening exam initiated at 5:29 PM.  Appropriate orders placed.  ETHELLE OLA was informed that the remainder of the evaluation will be completed by another provider, this initial triage assessment does not replace that evaluation, and the importance of remaining in the ED until their evaluation is complete.     Fayrene Helper, PA-C 08/12/23 1731

## 2023-08-13 ENCOUNTER — Emergency Department (HOSPITAL_COMMUNITY)

## 2023-08-13 DIAGNOSIS — I672 Cerebral atherosclerosis: Secondary | ICD-10-CM | POA: Diagnosis not present

## 2023-08-13 DIAGNOSIS — R55 Syncope and collapse: Secondary | ICD-10-CM | POA: Diagnosis not present

## 2023-08-13 LAB — RESP PANEL BY RT-PCR (RSV, FLU A&B, COVID)  RVPGX2
Influenza A by PCR: NEGATIVE
Influenza B by PCR: NEGATIVE
Resp Syncytial Virus by PCR: NEGATIVE
SARS Coronavirus 2 by RT PCR: NEGATIVE

## 2023-08-13 MED ORDER — MECLIZINE HCL 12.5 MG PO TABS: 12.5000 mg | ORAL_TABLET | Freq: Three times a day (TID) | ORAL | 0 refills | Status: DC | PRN

## 2023-08-13 MED ORDER — IOHEXOL 350 MG/ML SOLN
80.0000 mL | Freq: Once | INTRAVENOUS | Status: AC | PRN
  Administered 2023-08-13: 80 mL via INTRAVENOUS

## 2023-08-13 MED ORDER — SODIUM CHLORIDE (PF) 0.9 % IJ SOLN
INTRAMUSCULAR | Status: AC
  Filled 2023-08-13: qty 50

## 2023-08-13 NOTE — ED Provider Notes (Signed)
 [place] EMERGENCY DEPARTMENT AT [facility] Provider Note   CSN: [id number] Arrival date & time: 08/12/23  1658     History  Chief Complaint  Patient presents with   Dizziness    Jean Smith is a 73 y.o. female.  The history is provided by the patient.  Dizziness Quality:  Head spinning Severity:  Severe Duration: weeks. Timing:  Intermittent Progression:  Waxing and waning Context: bending over   Relieved by:  Nothing Worsened by:  Nothing Ineffective treatments:  None tried Associated symptoms: tinnitus   Associated symptoms: no chest pain   Risk factors: no hx of vertigo   Patient with allergy and anxiety with spinning and tinnitus.  Had some chest pressure.      Past Medical History:  Diagnosis Date   Allergy    Anxiety    Aortic regurgitation    moderate by echo 10/2020   Arthritis    Cancer [facility])    skin   Cataract    bilateral - MD is just watching    Diverticulosis    Fuchs' endothelial dystrophy    follows with optho regularly    Gestational diabetes    Heart murmur    MVP    History of depression    HSV-2 infection    Hyperlipidemia    Hyperplastic colon polyp    Mitral valve prolapse    mild to moderate MR by echo 10/2020   PVC's (premature ventricular contractions)    intol of BB, sxc palpitations r/t stress   RVOT ventricular tachycardia/PVCs    EP eval 01/2013 for freq PVCs     Home Medications Prior to Admission medications   Medication Sig Start Date End Date Taking? Authorizing Provider  meclizine (ANTIVERT) 12.5 MG tablet Take 1 tablet (12.5 mg total) by mouth 3 (three) times daily as needed for dizziness. 08/13/23  Yes Jolee Critcher, MD  ALPRAZolam Prudy Feeler) 0.25 MG tablet TAKE 1 TABLET BY MOUTH EVERY DAY AS NEEDED FOR ANXIETY 07/11/23   Myrlene Broker, MD  ascorbic acid (VITAMIN C) 500 MG tablet Take 500 mg by mouth every 3 (three) days. [phone] MG    [provider]  b complex vitamins capsule Take  1 capsule by mouth every 3 (three) days.    [provider]  Cholecalciferol (VITAMIN D-3) 125 MCG (5000 UT) TABS Take 1 tablet by mouth every 3 (three) days.    [provider]  Continuous Glucose Sensor (FREESTYLE LIBRE 3 PLUS SENSOR) MISC 1 each by Does not apply route every 14 (fourteen) days. 06/09/23   Carlus Pavlov, MD  loratadine (CLARITIN) 10 MG tablet Take 10 mg by mouth daily as needed for allergies.    [provider]  MAGNESIUM GLUCONATE PO Take 400 mg by mouth daily.    [provider]  propranolol (INDERAL) 10 MG tablet Take 10 mg by mouth as needed (increased HR and skipped beats).    [provider]  rosuvastatin (CRESTOR) 5 MG tablet Take 1 tablet (5 mg total) by mouth daily. 11/09/22   Myrlene Broker, MD  Turmeric (QC TUMERIC COMPLEX PO) Take by mouth every 3 (three) days.    [provider]      Allergies    Latex, Other, and Pollen extract    Review of Systems   Review of Systems  Constitutional:  Negative for fever.  HENT:  Positive for tinnitus.   Eyes:  Negative for photophobia.  Respiratory:  Negative for  wheezing and stridor.   Cardiovascular:  Negative for chest pain.  Neurological:  Positive for dizziness.  All other systems reviewed and are negative.   Physical Exam Updated Vital Signs BP (!) 142/84 (BP Location: Left Arm)   Pulse 93   Temp 97.9 F (36.6 C) (Oral)   Resp 17   Ht 5\' 4"  (1.626 m)   Wt 77.1 kg   SpO2 98%   BMI 29.18 kg/m  Physical Exam Vitals and nursing note reviewed.  Constitutional:      General: She is not in acute distress.    Appearance: Normal appearance. She is well-developed.  HENT:     Head: Normocephalic and atraumatic.     Nose: Nose normal.  Eyes:     Extraocular Movements: Extraocular movements intact.  Cardiovascular:     Rate and Rhythm: Normal rate and regular rhythm.     Pulses: Normal pulses.     Heart sounds: Normal heart sounds.  Pulmonary:      Effort: Pulmonary effort is normal. No respiratory distress.     Breath sounds: Normal breath sounds.  Abdominal:     General: Bowel sounds are normal. There is no distension.     Palpations: Abdomen is soft.     Tenderness: There is no abdominal tenderness. There is no guarding or rebound.  Musculoskeletal:        General: Normal range of motion.     Cervical back: Neck supple.  Skin:    General: Skin is warm and dry.     Capillary Refill: Capillary refill takes less than 2 seconds.     Findings: No erythema or rash.  Neurological:     General: No focal deficit present.     Mental Status: She is alert and oriented to person, place, and time.     Deep Tendon Reflexes: Reflexes normal.  Psychiatric:        Mood and Affect: Mood normal.     ED Results / Procedures / Treatments   Labs (all labs ordered are listed, but only abnormal results are displayed) Results for orders placed or performed during the hospital encounter of 08/12/23  CBG monitoring, ED   Collection Time: 08/12/23  5:33 PM  Result Value Ref Range   Glucose-Capillary 103 (H) 70 - 99 mg/dL  Basic metabolic panel   Collection Time: 08/12/23  5:34 PM  Result Value Ref Range   Sodium 137 135 - 145 mmol/L   Potassium 4.1 3.5 - 5.1 mmol/L   Chloride 104 98 - 111 mmol/L   CO2 24 22 - 32 mmol/L   Glucose, Bld 97 70 - 99 mg/dL   BUN 23 8 - 23 mg/dL   Creatinine, Ser 1.61 0.44 - 1.00 mg/dL   Calcium 9.6 8.9 - 09.6 mg/dL   GFR, Estimated >04 >54 mL/min   Anion gap 9 5 - 15  CBC   Collection Time: 08/12/23  5:34 PM  Result Value Ref Range   WBC 7.0 4.0 - 10.5 K/uL   RBC 4.99 3.87 - 5.11 MIL/uL   Hemoglobin 14.3 12.0 - 15.0 g/dL   HCT 09.8 11.9 - 14.7 %   MCV 88.4 80.0 - 100.0 fL   MCH 28.7 26.0 - 34.0 pg   MCHC 32.4 30.0 - 36.0 g/dL   RDW 82.9 56.2 - 13.0 %   Platelets 260 150 - 400 K/uL   nRBC 0.0 0.0 - 0.2 %  Troponin I (High Sensitivity)   Collection Time: 08/12/23  5:34  PM  Result Value Ref Range    Troponin I (High Sensitivity) 7 <18 ng/L  Urinalysis, Routine w reflex microscopic -Urine, Clean Catch   Collection Time: 08/12/23  8:42 PM  Result Value Ref Range   Color, Urine STRAW (A) YELLOW   APPearance CLEAR CLEAR   Specific Gravity, Urine 1.009 1.005 - 1.030   pH 5.0 5.0 - 8.0   Glucose, UA NEGATIVE NEGATIVE mg/dL   Hgb urine dipstick SMALL (A) NEGATIVE   Bilirubin Urine NEGATIVE NEGATIVE   Ketones, ur NEGATIVE NEGATIVE mg/dL   Protein, ur NEGATIVE NEGATIVE mg/dL   Nitrite NEGATIVE NEGATIVE   Leukocytes,Ua NEGATIVE NEGATIVE   RBC / HPF 0-5 0 - 5 RBC/hpf   WBC, UA 0-5 0 - 5 WBC/hpf   Bacteria, UA NONE SEEN NONE SEEN   Squamous Epithelial / HPF 0-5 0 - 5 /HPF   Mucus PRESENT   Troponin I (High Sensitivity)   Collection Time: 08/12/23  8:43 PM  Result Value Ref Range   Troponin I (High Sensitivity) 7 <18 ng/L  Resp panel by RT-PCR (RSV, Flu A&B, Covid) Anterior Nasal Swab   Collection Time: 08/13/23  3:04 AM   Specimen: Anterior Nasal Swab  Result Value Ref Range   SARS Coronavirus 2 by RT PCR NEGATIVE NEGATIVE   Influenza A by PCR NEGATIVE NEGATIVE   Influenza B by PCR NEGATIVE NEGATIVE   Resp Syncytial Virus by PCR NEGATIVE NEGATIVE   DG Chest 2 View Result Date: 08/12/2023 CLINICAL DATA:  Chest pain.  Intermittent dizziness. EXAM: CHEST - 2 VIEW COMPARISON:  07/05/2019 FINDINGS: Normal heart size and pulmonary vascularity. No focal airspace disease or consolidation in the lungs. No blunting of costophrenic angles. No pneumothorax. Mediastinal contours appear intact. Degenerative changes in the spine. IMPRESSION: No active cardiopulmonary disease. Electronically Signed   By: Burman Nieves M.D.   On: 08/12/2023 18:10    EKG  EKG Interpretation Date/Time:  Friday August 12 2023 17:30:41 EST Ventricular Rate:  81 PR Interval:  207 QRS Duration:  91 QT Interval:  380 QTC Calculation: 442 R Axis:   44  Text Interpretation: Sinus rhythm Confirmed by Donley Harland  ([id number]) on 08/13/2023 6:32:12 AM         Radiology DG Chest 2 View Result Date: 08/12/2023 CLINICAL DATA:  Chest pain.  Intermittent dizziness. EXAM: CHEST - 2 VIEW COMPARISON:  07/05/2019 FINDINGS: Normal heart size and pulmonary vascularity. No focal airspace disease or consolidation in the lungs. No blunting of costophrenic angles. No pneumothorax. Mediastinal contours appear intact. Degenerative changes in the spine. IMPRESSION: No active cardiopulmonary disease. Electronically Signed   By: Burman Nieves M.D.   On: 08/12/2023 18:10    Procedures Procedures    Medications Ordered in ED Medications  meclizine (ANTIVERT) tablet 25 mg (25 mg Oral Given 08/12/23 1747)  iohexol (OMNIPAQUE) 350 MG/ML injection 80 mL (80 mLs Intravenous Contrast Given 08/13/23 0608)    ED Course/ Medical Decision Making/ A&P                                 Medical Decision Making Patient with vertigo and tinnitus   Amount and/or Complexity of Data Reviewed External Data Reviewed: notes.    Details: Previous notes reviewed  Labs: ordered.    Details: Normal sodium 137, normal potassium 4.1, normal creatinine. Normal white count 7, normal hemoglobin 14, normal platelets normal troponin 7  Radiology: ordered and independent  interpretation performed.    Details: Normal CXR by me  ECG/medicine tests: ordered and independent interpretation performed. Decision-making details documented in ED Course.  Risk Prescription drug management. Risk Details: Awaiting CTA, if this is negative with positional symptoms and tinnitus this is a peripheral issue    Final Clinical Impression(s) / ED Diagnoses Final diagnoses:  Vertigo   Signed out to Dr. Rubin Payor pending CTA    Nicanor Alcon, Ayvion Kavanagh, MD 08/13/23 [phone]

## 2023-08-18 ENCOUNTER — Ambulatory Visit: Payer: PPO | Admitting: Physical Therapy

## 2023-08-22 ENCOUNTER — Other Ambulatory Visit: Payer: Self-pay

## 2023-08-22 DIAGNOSIS — R42 Dizziness and giddiness: Secondary | ICD-10-CM

## 2023-08-22 DIAGNOSIS — R2689 Other abnormalities of gait and mobility: Secondary | ICD-10-CM

## 2023-08-22 DIAGNOSIS — G8929 Other chronic pain: Secondary | ICD-10-CM

## 2023-08-24 DIAGNOSIS — H9313 Tinnitus, bilateral: Secondary | ICD-10-CM | POA: Diagnosis not present

## 2023-08-24 DIAGNOSIS — H6123 Impacted cerumen, bilateral: Secondary | ICD-10-CM | POA: Diagnosis not present

## 2023-08-24 DIAGNOSIS — R42 Dizziness and giddiness: Secondary | ICD-10-CM | POA: Diagnosis not present

## 2023-08-31 ENCOUNTER — Ambulatory Visit: Attending: Family Medicine | Admitting: Physical Therapy

## 2023-08-31 ENCOUNTER — Other Ambulatory Visit: Payer: Self-pay

## 2023-08-31 ENCOUNTER — Encounter: Payer: Self-pay | Admitting: Physical Therapy

## 2023-08-31 DIAGNOSIS — H8113 Benign paroxysmal vertigo, bilateral: Secondary | ICD-10-CM | POA: Diagnosis not present

## 2023-08-31 DIAGNOSIS — R42 Dizziness and giddiness: Secondary | ICD-10-CM | POA: Insufficient documentation

## 2023-08-31 DIAGNOSIS — M25511 Pain in right shoulder: Secondary | ICD-10-CM | POA: Insufficient documentation

## 2023-08-31 DIAGNOSIS — M25512 Pain in left shoulder: Secondary | ICD-10-CM | POA: Insufficient documentation

## 2023-08-31 DIAGNOSIS — G8929 Other chronic pain: Secondary | ICD-10-CM | POA: Diagnosis not present

## 2023-08-31 DIAGNOSIS — M25561 Pain in right knee: Secondary | ICD-10-CM | POA: Insufficient documentation

## 2023-08-31 DIAGNOSIS — M6281 Muscle weakness (generalized): Secondary | ICD-10-CM | POA: Insufficient documentation

## 2023-08-31 DIAGNOSIS — R2689 Other abnormalities of gait and mobility: Secondary | ICD-10-CM | POA: Diagnosis not present

## 2023-08-31 NOTE — Therapy (Signed)
 OUTPATIENT PHYSICAL THERAPY EVALUATION   Patient Name: Jean Smith MRN: [id number] DOB:December 01, 1950, 73 y.o., female Today's Date: 08/31/2023   PCP: Hillard Danker DN REFERRING PROVIDER: Antoine Primas DO  END OF SESSION:   Past Medical History:  Diagnosis Date   Allergy    Anxiety    Aortic regurgitation    moderate by echo 10/2020   Arthritis    Cancer [facility])    skin   Cataract    bilateral - MD is just watching    Diverticulosis    Fuchs' endothelial dystrophy    follows with optho regularly    Gestational diabetes    Heart murmur    MVP    History of depression    HSV-2 infection    Hyperlipidemia    Hyperplastic colon polyp    Mitral valve prolapse    mild to moderate MR by echo 10/2020   PVC's (premature ventricular contractions)    intol of BB, sxc palpitations r/t stress   RVOT ventricular tachycardia/PVCs    EP eval 01/2013 for freq PVCs   Past Surgical History:  Procedure Laterality Date   CESAREAN SECTION     x 3   COLONOSCOPY     KNEE SURGERY Right    MANDIBLE SURGERY     right side in front of ear   MOHS SURGERY  2021   POLYPECTOMY     WISDOM TOOTH EXTRACTION     Patient Active Problem List   Diagnosis Date Noted   AC (acromioclavicular) arthritis 05/28/2022   Mitral valve prolapse    Aortic regurgitation    NSVT (nonsustained ventricular tachycardia) (HCC) 09/25/2020   Diabetes mellitus type 2 with complications (HCC) 08/27/2020   Chronic pain of both shoulders 05/05/2018   Other fatigue 01/02/2018   Allergic rhinitis 01/02/2018   Routine general medical examination at a health care facility 09/01/2015   Hyperlipidemia associated with type 2 diabetes mellitus (HCC) 09/01/2015   Varicose vein 08/27/2014   Primary localized osteoarthrosis, lower leg 10/30/2013   RVOT ventricular tachycardia/PVCs 01/31/2013   Abnormal stress echo 12/11/2012    ONSET DATE: February   REFERRING DIAG: vertigo, balance disorder, chronic right knee  pain; chronic pain of both shoulders  THERAPY DIAG:  Vertigo; dizziness; balance disorder; generalized weakness; right knee pain  Rationale for Evaluation and Treatment: Rehabilitation  SUBJECTIVE:                                                                                                                                                                                             SUBJECTIVE STATEMENT: I've got vertigo. The kitchen started spinning,  another episode in [employer] and [employer].  Went to ER and they checked out my heart.  They gave me medication 3x/day and it made me fall asleep in my chair (like non-drowsy dramamine). I've stopped taking it. Saw an ENT. They say it's BPPV. I thought it was from the cortisone shot in my knee.  I've had it before.  Doing chair ex's for knees.  I'm afraid to use the foam ex's bc of my balance.  Turning head to the right brings it on. Left ear feels different.  The really bad episodes about once a week.   If I moved my head quickly it would bring it on know.  I've been doing my bike and seated leg flutter kicks.  The knee doesn't feel like it used to, my hip would hurt too.  This has set me back.   PERTINENT HISTORY:  DM Chronic right knee pain with prior PT ending in Feb (cortisone injection in knee in Feb)  PAIN:   Are you having pain? Yes NPRS scale: 4/10 Pain location: right > left knee pain; ongoing shoulder pain with overhead  Pain orientation: Bilateral  PAIN TYPE: aching Pain description: intermittent  Aggravating factors: turning head to the right;  a little with looking up at the sky (careful in the shower); careful with bending over (getting stuff out of the fridge low shelf); arms overhead; turning head and looking up at the waitress at the restaurant; leaning over the sink; pushing the grocery cart and reaching for items; can't walk the steps (I have to crawl up them) Relieving factors: keeping head still   PRECAUTIONS: risk of  falls    WEIGHT BEARING RESTRICTIONS: No  FALLS: Has patient fallen in last 6 months? Yes. Number of falls 1  LIVING ENVIRONMENT: Lives with: lives with their spouse Lives in: House/apartment Stairs: Yes: Internal: 12 steps; on right going up and External: 5 steps; on right going up Has following equipment at home: None PLOF: Independent  PATIENT GOALS: stop dizziness with head movements; improve balance, make my arms and legs stronger  OBJECTIVE:  Note: Objective measures were completed at Evaluation unless otherwise noted. 05/30/2022 DIAGNOSTIC FINDINGS: LEFT SHOULDER - 3 VIEW   COMPARISON:  None Available.   FINDINGS: There is no evidence of fracture or dislocation. Mild glenohumeral osteophytosis. Moderate acromioclavicular joint space loss and osteophytosis. No soft tissue abnormalities. The visualized left lung is clear.   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     COGNITION: Overall cognitive status: Within functional limits for tasks assessed  Cervical ROM: 25% limited with rotation and extension (slow movement) UPPER EXTREMITY ROM: WFLs UPPER EXTREMITY MMT: shoulders grossly 4/5  LOWER EXTREMITY ROM:    right knee 3-115, left knee 0-120 degrees LOWER EXTREMITY MMT:  right knee extension 4-/5; left knee extension 4/5; left hip abduction 4/5;  right hip abduction 4-/5; SLS on left 9 sec, right 3 sec with pelvic drop indicating glute weakness on right    PATIENT SURVEYS:  Dizziness handicap Inventory 62% Activities specific Balance Confidence Scale: 45.62% (low confidence in balance)  BERG balance test:    44  /56  5x STS 14.30 sec no UE assist needed  TREATMENT DATE: 3/19  Evaluation Discussion on vestibular system impact on balance and canalith repositioning maneuvers to address  dizziness with head movements  PATIENT EDUCATION: Education details: Educated patient on anatomy and physiology of current symptoms, prognosis, plan of care as well as initial self care strategies to promote recovery Person educated: Patient Education method: Explanation Education comprehension: verbalized understanding  HOME EXERCISE PROGRAM: To be started  GOALS: Goals reviewed with patient? Yes  SHORT TERM GOALS: Target date: 09/28/2023  The patient will demonstrate knowledge of basic self care strategies and exercises to promote healing  Baseline: Goal status: INITIAL  2.  Improvement in dizziness with head movements by 50% Baseline:  Goal status: INITIAL  3.  Patient will be able to lean over the sink and dishwasher with minimal dizziness Baseline:  Goal status: INITIAL  4.  The patient will have an improved BERG balance score to  47  /56 indicating reduced risk of falls  Baseline:  Goal status: INITIAL  5.  The patient will have improved knee and hip strength to at least 4/5 needed for standing, walking longer distances  Baseline:  Goal status: INITIAL   LONG TERM GOALS: Target date: 10/26/2023    The patient will be independent in a safe self progression of a home exercise program to promote further recovery of function  Baseline:  Goal status: INITIAL  2.  The patient will have improved LE strength of at least 4+/5 needed to ascend and descend steps reciprocally with UE support  Baseline:  Goal status: INITIAL  3.  The patient will have an improved BERG balance score to  49/56 indicating reduced risk of falls  Baseline:  Goal status: INITIAL  4.  ABC scale improved to 60% indicating improved confidence in balance Baseline:  Goal status: INITIAL  5.  Dizziness Handicap Inventory improved to 50% Baseline:  Goal status: INITIAL    ASSESSMENT:  CLINICAL IMPRESSION: Patient is a 73 y.o. female who was seen today for physical therapy evaluation and  treatment for balance disorder, vertigo/dizziness, chronic right knee pain and generalized weakness of upper and lower extremities.  Patient recently completed 30 visits of PT for knee pain however due to the new onset of vertigo/dizziness she has been unable to do her knee HEP. Dizziness and imbalance is present with turning her head, pushing the grocery cart and reaching for items at the grocery store, leaning over the sink and turning her head while looking up when ordering at a restaurant.  LE weakness and pain makes it very difficult to ascend stairs and she typically crawls up the steps at home.  UE weakness affects ability to reach for items in the fridge.   She would benefit from PT to perform canalith repositioning, balance ex's and strengthening of extremities.   OBJECTIVE IMPAIRMENTS: decreased activity tolerance, decreased balance, difficulty walking, decreased strength, dizziness, impaired perceived functional ability, impaired UE functional use, and pain.   ACTIVITY LIMITATIONS: carrying, lifting, bending, squatting, stairs, and reach over head  PARTICIPATION LIMITATIONS: meal prep, cleaning, laundry, driving, shopping, and community activity  PERSONAL FACTORS: Time since onset of injury/illness/exacerbation and 1-2 comorbidities: chronic knee pain, diabetes, prior history of dizziness  are also affecting patient's functional outcome.   REHAB POTENTIAL: Good  CLINICAL DECISION MAKING: Stable/uncomplicated  EVALUATION COMPLEXITY: Low  PLAN:  PT FREQUENCY: 2x/week  PT DURATION: 8 weeks  PLANNED INTERVENTIONS: 97164- PT Re-evaluation, 97110-Therapeutic exercises, 97530- Therapeutic activity, [id number]- Neuromuscular re-education, 97535- Self Care, 96045- Manual therapy, 40981- Canalith  repositioning, 08657- Aquatic Therapy, [phone]- Electrical stimulation (unattended), [phone]- Electrical stimulation (manual), [id number]- Ultrasound, [id number]- Ionotophoresis 4mg /ml Dexamethasone, Patient/Family  education, Taping, Dry Needling, Joint mobilization, Vestibular training, Visual/preceptual remediation/compensation, Cryotherapy, and Moist heat  PLAN FOR NEXT SESSION: further vestibular assessment and canalith repositioning; balance ex's; UE/LE functional strengthening  Lavinia Sharps, PT 08/31/23 9:41 PM Phone: [phone] Fax: [phone]

## 2023-09-06 ENCOUNTER — Encounter: Payer: Self-pay | Admitting: Rehabilitative and Restorative Service Providers"

## 2023-09-06 ENCOUNTER — Ambulatory Visit: Payer: Self-pay | Admitting: Rehabilitative and Restorative Service Providers"

## 2023-09-06 DIAGNOSIS — R42 Dizziness and giddiness: Secondary | ICD-10-CM

## 2023-09-06 DIAGNOSIS — R2689 Other abnormalities of gait and mobility: Secondary | ICD-10-CM

## 2023-09-06 DIAGNOSIS — H8113 Benign paroxysmal vertigo, bilateral: Secondary | ICD-10-CM

## 2023-09-06 DIAGNOSIS — M6281 Muscle weakness (generalized): Secondary | ICD-10-CM

## 2023-09-06 DIAGNOSIS — G8929 Other chronic pain: Secondary | ICD-10-CM

## 2023-09-06 DIAGNOSIS — M25511 Pain in right shoulder: Secondary | ICD-10-CM | POA: Diagnosis not present

## 2023-09-06 NOTE — Therapy (Signed)
 OUTPATIENT PHYSICAL THERAPY VESTIBULAR NOTE   Patient Name: Jean Smith MRN: [id number] DOB:16-Sep-1950, 73 y.o., female Today's Date: 09/06/2023   PCP: Hillard Danker DN REFERRING PROVIDER: Antoine Primas DO  END OF SESSION:  PT End of Session - 09/06/23 1238     Visit Number 2    Date for PT Re-Evaluation 10/26/23    Authorization Type [employer]    Progress Note Due on Visit 10    PT Start Time 1235    PT Stop Time 1315    PT Time Calculation (min) 40 min    Activity Tolerance Patient tolerated treatment well    Behavior During Therapy [facility] for tasks assessed/performed             Past Medical History:  Diagnosis Date   Allergy    Anxiety    Aortic regurgitation    moderate by echo 10/2020   Arthritis    Cancer (HCC)    skin   Cataract    bilateral - MD is just watching    Diverticulosis    Fuchs' endothelial dystrophy    follows with optho regularly    Gestational diabetes    Heart murmur    MVP    History of depression    HSV-2 infection    Hyperlipidemia    Hyperplastic colon polyp    Mitral valve prolapse    mild to moderate MR by echo 10/2020   PVC's (premature ventricular contractions)    intol of BB, sxc palpitations r/t stress   RVOT ventricular tachycardia/PVCs    EP eval 01/2013 for freq PVCs   Past Surgical History:  Procedure Laterality Date   CESAREAN SECTION     x 3   COLONOSCOPY     KNEE SURGERY Right    MANDIBLE SURGERY     right side in front of ear   MOHS SURGERY  2021   POLYPECTOMY     WISDOM TOOTH EXTRACTION     Patient Active Problem List   Diagnosis Date Noted   AC (acromioclavicular) arthritis 05/28/2022   Mitral valve prolapse    Aortic regurgitation    NSVT (nonsustained ventricular tachycardia) (HCC) 09/25/2020   Diabetes mellitus type 2 with complications (HCC) 08/27/2020   Chronic pain of both shoulders 05/05/2018   Other fatigue 01/02/2018   Allergic rhinitis 01/02/2018   Routine general  medical examination at a health care facility 09/01/2015   Hyperlipidemia associated with type 2 diabetes mellitus (HCC) 09/01/2015   Varicose vein 08/27/2014   Primary localized osteoarthrosis, lower leg 10/30/2013   RVOT ventricular tachycardia/PVCs 01/31/2013   Abnormal stress echo 12/11/2012    ONSET DATE: February   REFERRING DIAG: vertigo, balance disorder, chronic right knee pain; chronic pain of both shoulders  THERAPY DIAG:  Vertigo; dizziness; balance disorder; generalized weakness; right knee pain  Rationale for Evaluation and Treatment: Rehabilitation  SUBJECTIVE:  SUBJECTIVE STATEMENT: Patient reports that she has still been having dizziness and is doing a lot of guarding movements to prevent dizziness with head turns.  States that her right knee is still hurting, reports that it "locked up" last night.  PERTINENT HISTORY:  DM Chronic right knee pain with prior PT ending in Feb (cortisone injection in knee in Feb)  PAIN:   Are you having pain? Yes NPRS scale: 5/10 Pain location: right > left knee pain; ongoing shoulder pain with overhead  Pain orientation: Bilateral  PAIN TYPE: aching Pain description: intermittent  Aggravating factors: turning head to the right;  a little with looking up at the sky (careful in the shower); careful with bending over (getting stuff out of the fridge low shelf); arms overhead; turning head and looking up at the waitress at the restaurant; leaning over the sink; pushing the grocery cart and reaching for items; can't walk the steps (I have to crawl up them) Relieving factors: keeping head still   PRECAUTIONS: risk of falls    WEIGHT BEARING RESTRICTIONS: No  FALLS: Has patient fallen in last 6 months? Yes. Number of falls 1  LIVING  ENVIRONMENT: Lives with: lives with their spouse Lives in: House/apartment Stairs: Yes: Internal: 12 steps; on right going up and External: 5 steps; on right going up Has following equipment at home: None PLOF: Independent  PATIENT GOALS: stop dizziness with head movements; improve balance, make my arms and legs stronger  OBJECTIVE:  Note: Objective measures were completed at Evaluation unless otherwise noted. 05/30/2022 DIAGNOSTIC FINDINGS: LEFT SHOULDER - 3 VIEW   COMPARISON:  None Available.   FINDINGS: There is no evidence of fracture or dislocation. Mild glenohumeral osteophytosis. Moderate acromioclavicular joint space loss and osteophytosis. No soft tissue abnormalities. The visualized left lung is clear.   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     COGNITION: Overall cognitive status: Within functional limits for tasks assessed  Cervical ROM: 25% limited with rotation and extension (slow movement) UPPER EXTREMITY ROM: WFLs UPPER EXTREMITY MMT: shoulders grossly 4/5  LOWER EXTREMITY ROM:    right knee 3-115, left knee 0-120 degrees LOWER EXTREMITY MMT:  right knee extension 4-/5; left knee extension 4/5; left hip abduction 4/5;  right hip abduction 4-/5; SLS on left 9 sec, right 3 sec with pelvic drop indicating glute weakness on right    PATIENT SURVEYS:  Dizziness handicap Inventory 62% Activities specific Balance Confidence Scale: 45.62% (low confidence in balance)  FUNCTIONAL ASSESSMENT:  Eval: BERG balance test:    44  /56 5x STS 14.30 sec no UE assist needed   VESTIBULAR ASSESSMENT: 09/06/2023: Smooth Pursuit:  WFL Gaze Stabilization:  WFL Saccades:  WFL Head Thrust:  positive for nystagmus Dix Hallpike: Negative bilat Supine Head Roll Test:  positive on the right, negative on the left Canalith Repositioning:  Barbecue Roll to treat the right  TODAY'S TREATMENT  DATE: 09/06/2023 Nustep level 3 x6 min with PT present to discuss status Vestibular Reassessment (see above) Dix Hallpike: Negative bilat Supine Head Roll Test:  positive on the right, negative on the left Canalith Repositioning:  Barbecue Roll to treat the right   DATE: 3/19  Evaluation Discussion on vestibular system impact on balance and canalith repositioning maneuvers to address dizziness with head movements  PATIENT EDUCATION: Education details: Educated patient on anatomy and physiology of current symptoms, prognosis, plan of care as well as initial self care strategies to promote recovery Person educated: Patient Education method: Explanation Education comprehension: verbalized understanding  HOME EXERCISE PROGRAM: Access Code: [email or web address] URL: [email or web address] Date: 09/06/2023 Prepared by: Reather Laurence  Exercises - Brandt-Daroff Vestibular Exercise  - 1 x daily - 7 x weekly - 3-5 reps - Seated Gaze Stabilization with Head Rotation  - 1 x daily - 7 x weekly - 2 sets - 30 reps - Seated Gaze Stabilization with Head Nod  - 1 x daily - 7 x weekly - 2 sets - 30 reps  GOALS: Goals reviewed with patient? Yes  SHORT TERM GOALS: Target date: 09/28/2023  The patient will demonstrate knowledge of basic self care strategies and exercises to promote healing  Baseline: Goal status: Ongoing  2.  Improvement in dizziness with head movements by 50% Baseline:  Goal status: INITIAL  3.  Patient will be able to lean over the sink and dishwasher with minimal dizziness Baseline:  Goal status: INITIAL  4.  The patient will have an improved BERG balance score to  47  /56 indicating reduced risk of falls  Baseline:  Goal status: INITIAL  5.  The patient will have improved knee and hip strength to at least 4/5 needed for standing, walking longer distances  Baseline:  Goal status:  INITIAL   LONG TERM GOALS: Target date: 10/26/2023    The patient will be independent in a safe self progression of a home exercise program to promote further recovery of function  Baseline:  Goal status: INITIAL  2.  The patient will have improved LE strength of at least 4+/5 needed to ascend and descend steps reciprocally with UE support  Baseline:  Goal status: INITIAL  3.  The patient will have an improved BERG balance score to  49/56 indicating reduced risk of falls  Baseline:  Goal status: INITIAL  4.  ABC scale improved to 60% indicating improved confidence in balance Baseline:  Goal status: INITIAL  5.  Dizziness Handicap Inventory improved to 50% Baseline:  Goal status: INITIAL    ASSESSMENT:  CLINICAL IMPRESSION: Jean Smith presents to skilled PT reporting that she is having intermittent dizziness.  Patient with negative Gilberto Better today, but did have slight positive with Supine Head Roll test and proceeded with barbecue roll.  Patient did report no dizziness by last position on roll test.  Patient provided with updated HEP today for vestibular rehab.  Patient with positive Head Impulse Test, so provided VOR exercises.  Patient continues to require skilled PT to progress towards goal related activities.  OBJECTIVE IMPAIRMENTS: decreased activity tolerance, decreased balance, difficulty walking, decreased strength, dizziness, impaired perceived functional ability, impaired UE functional use, and pain.   ACTIVITY LIMITATIONS: carrying, lifting, bending, squatting, stairs, and reach over head  PARTICIPATION LIMITATIONS: meal prep, cleaning, laundry, driving, shopping, and community activity  PERSONAL FACTORS: Time since onset of injury/illness/exacerbation and 1-2 comorbidities: chronic knee pain, diabetes, prior history of dizziness  are also affecting patient's  functional outcome.   REHAB POTENTIAL: Good  CLINICAL DECISION MAKING:  Stable/uncomplicated  EVALUATION COMPLEXITY: Low  PLAN:  PT FREQUENCY: 2x/week  PT DURATION: 8 weeks  PLANNED INTERVENTIONS: 97164- PT Re-evaluation, 97110-Therapeutic exercises, 97530- Therapeutic activity, 97112- Neuromuscular re-education, 97535- Self Care, 78469- Manual therapy, [phone]- Canalith repositioning, [id number]- Aquatic Therapy, [id number]- Electrical stimulation (unattended), [phone]- Electrical stimulation (manual), [id number]- Ultrasound, [id number]- Ionotophoresis 4mg /ml Dexamethasone, Patient/Family education, Taping, Dry Needling, Joint mobilization, Vestibular training, Visual/preceptual remediation/compensation, Cryotherapy, and Moist heat  PLAN FOR NEXT SESSION: Vestibular/canalith reposition as indicated; balance ex's; UE/LE functional strengthening    Reather Laurence, PT, DPT 09/06/23, 1:26 PM  [facility] [address] [place], [place] [address] Phone # [phone] Fax [phone]

## 2023-09-13 ENCOUNTER — Ambulatory Visit: Attending: Family Medicine | Admitting: Physical Therapy

## 2023-09-13 DIAGNOSIS — R252 Cramp and spasm: Secondary | ICD-10-CM | POA: Diagnosis not present

## 2023-09-13 DIAGNOSIS — R2689 Other abnormalities of gait and mobility: Secondary | ICD-10-CM | POA: Insufficient documentation

## 2023-09-13 DIAGNOSIS — M6281 Muscle weakness (generalized): Secondary | ICD-10-CM | POA: Diagnosis not present

## 2023-09-13 DIAGNOSIS — M25661 Stiffness of right knee, not elsewhere classified: Secondary | ICD-10-CM | POA: Diagnosis not present

## 2023-09-13 DIAGNOSIS — R262 Difficulty in walking, not elsewhere classified: Secondary | ICD-10-CM | POA: Diagnosis not present

## 2023-09-13 DIAGNOSIS — M25511 Pain in right shoulder: Secondary | ICD-10-CM | POA: Insufficient documentation

## 2023-09-13 DIAGNOSIS — M25561 Pain in right knee: Secondary | ICD-10-CM | POA: Insufficient documentation

## 2023-09-13 DIAGNOSIS — M25512 Pain in left shoulder: Secondary | ICD-10-CM | POA: Diagnosis not present

## 2023-09-13 DIAGNOSIS — R42 Dizziness and giddiness: Secondary | ICD-10-CM | POA: Diagnosis not present

## 2023-09-13 DIAGNOSIS — G8929 Other chronic pain: Secondary | ICD-10-CM | POA: Diagnosis not present

## 2023-09-13 DIAGNOSIS — H8113 Benign paroxysmal vertigo, bilateral: Secondary | ICD-10-CM | POA: Insufficient documentation

## 2023-09-13 NOTE — Therapy (Signed)
 OUTPATIENT PHYSICAL THERAPY VESTIBULAR NOTE   Patient Name: Jean Smith MRN: [id number] DOB:[birth year], 73 y.o., female Today's Date: 09/13/2023   PCP: Hillard Danker DN REFERRING PROVIDER: Antoine Primas DO  END OF SESSION:  PT End of Session - 09/13/23 1018     Visit Number 3    Date for PT Re-Evaluation 10/26/23    Authorization Type [employer]    Progress Note Due on Visit 10    PT Start Time 1018    PT Stop Time 1055    PT Time Calculation (min) 37 min    Activity Tolerance Patient tolerated treatment well    Behavior During Therapy [facility] for tasks assessed/performed              Past Medical History:  Diagnosis Date   Allergy    Anxiety    Aortic regurgitation    moderate by echo 10/2020   Arthritis    Cancer (HCC)    skin   Cataract    bilateral - MD is just watching    Diverticulosis    Fuchs' endothelial dystrophy    follows with optho regularly    Gestational diabetes    Heart murmur    MVP    History of depression    HSV-2 infection    Hyperlipidemia    Hyperplastic colon polyp    Mitral valve prolapse    mild to moderate MR by echo 10/2020   PVC's (premature ventricular contractions)    intol of BB, sxc palpitations r/t stress   RVOT ventricular tachycardia/PVCs    EP eval 01/2013 for freq PVCs   Past Surgical History:  Procedure Laterality Date   CESAREAN SECTION     x 3   COLONOSCOPY     KNEE SURGERY Right    MANDIBLE SURGERY     right side in front of ear   MOHS SURGERY  2021   POLYPECTOMY     WISDOM TOOTH EXTRACTION     Patient Active Problem List   Diagnosis Date Noted   AC (acromioclavicular) arthritis 05/28/2022   Mitral valve prolapse    Aortic regurgitation    NSVT (nonsustained ventricular tachycardia) (HCC) 09/25/2020   Diabetes mellitus type 2 with complications (HCC) 08/27/2020   Chronic pain of both shoulders 05/05/2018   Other fatigue 01/02/2018   Allergic rhinitis 01/02/2018   Routine general  medical examination at a health care facility 09/01/2015   Hyperlipidemia associated with type 2 diabetes mellitus (HCC) 09/01/2015   Varicose vein 08/27/2014   Primary localized osteoarthrosis, lower leg 10/30/2013   RVOT ventricular tachycardia/PVCs 01/31/2013   Abnormal stress echo 12/11/2012    ONSET DATE: February   REFERRING DIAG: vertigo, balance disorder, chronic right knee pain; chronic pain of both shoulders  THERAPY DIAG:  Vertigo; dizziness; balance disorder; generalized weakness; right knee pain  Rationale for Evaluation and Treatment: Rehabilitation  SUBJECTIVE:  SUBJECTIVE STATEMENT: Pt reports she is not having the spinning sensation but feels wobbly.   PERTINENT HISTORY:  DM Chronic right knee pain with prior PT ending in Feb (cortisone injection in knee in Feb)  PAIN:   Are you having pain? Yes NPRS scale: 5/10 Pain location: right > left knee pain; ongoing shoulder pain with overhead  Pain orientation: Bilateral  PAIN TYPE: aching Pain description: intermittent  Aggravating factors: turning head to the right;  a little with looking up at the sky (careful in the shower); careful with bending over (getting stuff out of the fridge low shelf); arms overhead; turning head and looking up at the waitress at the restaurant; leaning over the sink; pushing the grocery cart and reaching for items; can't walk the steps (I have to crawl up them) Relieving factors: keeping head still   PRECAUTIONS: risk of falls    WEIGHT BEARING RESTRICTIONS: No  FALLS: Has patient fallen in last 6 months? Yes. Number of falls 1  LIVING ENVIRONMENT: Lives with: lives with their spouse Lives in: House/apartment Stairs: Yes: Internal: 12 steps; on right going up and External: 5 steps; on right going  up Has following equipment at home: None PLOF: Independent  PATIENT GOALS: stop dizziness with head movements; improve balance, make my arms and legs stronger  OBJECTIVE:  Note: Objective measures were completed at Evaluation unless otherwise noted. 05/30/2022 DIAGNOSTIC FINDINGS: LEFT SHOULDER - 3 VIEW   COMPARISON:  None Available.   FINDINGS: There is no evidence of fracture or dislocation. Mild glenohumeral osteophytosis. Moderate acromioclavicular joint space loss and osteophytosis. No soft tissue abnormalities. The visualized left lung is clear.   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     COGNITION: Overall cognitive status: Within functional limits for tasks assessed  Cervical ROM: 25% limited with rotation and extension (slow movement) UPPER EXTREMITY ROM: WFLs UPPER EXTREMITY MMT: shoulders grossly 4/5  LOWER EXTREMITY ROM:    right knee 3-115, left knee 0-120 degrees LOWER EXTREMITY MMT:  right knee extension 4-/5; left knee extension 4/5; left hip abduction 4/5;  right hip abduction 4-/5; SLS on left 9 sec, right 3 sec with pelvic drop indicating glute weakness on right    PATIENT SURVEYS:  Dizziness handicap Inventory 62% Activities specific Balance Confidence Scale: 45.62% (low confidence in balance)  FUNCTIONAL ASSESSMENT:  Eval: BERG balance test:    44  /56 5x STS 14.30 sec no UE assist needed   VESTIBULAR ASSESSMENT: 09/06/2023: Smooth Pursuit:  WFL Gaze Stabilization:  WFL Saccades:  WFL Head Thrust:  positive for nystagmus Dix Hallpike: Negative bilat Supine Head Roll Test:  positive on the right, negative on the left Canalith Repositioning:  Barbecue Roll to treat the right                                                                                                                             TODAY'S TREATMENT DATE: 09/13/2023 Nustep level 5  x6 min with PT present to discuss status Dix-Hallpike:  (-) bilat Sidelying: (-) bilat Roll test: (-) bilat Standing feet as close together as possible on firm surface VOR x1; horizontal and vertical 2x30" each Saccades horizontal and vertical 2x30" each Smooth pursuit horizontal and vertical 2x30"  Self care: checking for BPPV and steps to take at home if it occurs, HEP updates   DATE: 09/06/2023 Nustep level 3 x6 min with PT present to discuss status Vestibular Reassessment (see above) Dix Hallpike: Negative bilat Supine Head Roll Test:  positive on the right, negative on the left Canalith Repositioning:  Barbecue Roll to treat the right   DATE: 3/19  Evaluation Discussion on vestibular system impact on balance and canalith repositioning maneuvers to address dizziness with head movements  PATIENT EDUCATION: Education details: Educated patient on anatomy and physiology of current symptoms, prognosis, plan of care as well as initial self care strategies to promote recovery Person educated: Patient Education method: Explanation Education comprehension: verbalized understanding  HOME EXERCISE PROGRAM: Access Code: [email or web address] URL: [email or web address] Date: 09/13/2023 Prepared by: Vernon Prey April Kirstie Peri  Exercises - Brandt-Daroff Vestibular Exercise  - 1 x daily - 7 x weekly - 3-5 reps - Seated Gaze Stabilization with Head Rotation  - 1 x daily - 7 x weekly - 2 sets - 30 reps - Seated Gaze Stabilization with Head Nod  - 1 x daily - 7 x weekly - 2 sets - 30 reps - Standing Feet Together Smooth Pursuit  - 1 x daily - 7 x weekly - 2 sets - 30 sec hold - Standing Horizontal Saccades  - 1 x daily - 7 x weekly - 2 sets - 30 sec hold - Standing Vertical Saccades  - 1 x daily - 7 x weekly - 2 sets - 30 sec hold  GOALS: Goals reviewed with patient? Yes  SHORT TERM GOALS: Target date: 09/28/2023  The patient will demonstrate knowledge of basic self care strategies and exercises to promote healing  Baseline: Goal status:  Ongoing  2.  Improvement in dizziness with head movements by 50% Baseline:  Goal status: INITIAL  3.  Patient will be able to lean over the sink and dishwasher with minimal dizziness Baseline:  Goal status: INITIAL  4.  The patient will have an improved BERG balance score to  47  /56 indicating reduced risk of falls  Baseline:  Goal status: INITIAL  5.  The patient will have improved knee and hip strength to at least 4/5 needed for standing, walking longer distances  Baseline:  Goal status: INITIAL   LONG TERM GOALS: Target date: 10/26/2023    The patient will be independent in a safe self progression of a home exercise program to promote further recovery of function  Baseline:  Goal status: INITIAL  2.  The patient will have improved LE strength of at least 4+/5 needed to ascend and descend steps reciprocally with UE support  Baseline:  Goal status: INITIAL  3.  The patient will have an improved BERG balance score to  49/56 indicating reduced risk of falls  Baseline:  Goal status: INITIAL  4.  ABC scale improved to 60% indicating improved confidence in balance Baseline:  Goal status: INITIAL  5.  Dizziness Handicap Inventory improved to 50% Baseline:  Goal status: INITIAL    ASSESSMENT:  CLINICAL IMPRESSION: Pt did not have any s/s of BPPV with canalith testing this session. Has some reported feelings of unsteadiness and wobbliness. Continued to work on  habituation and gaze stabilization exercises. Pt highly challenged with smooth pursuit especially vertically.   OBJECTIVE IMPAIRMENTS: decreased activity tolerance, decreased balance, difficulty walking, decreased strength, dizziness, impaired perceived functional ability, impaired UE functional use, and pain.   ACTIVITY LIMITATIONS: carrying, lifting, bending, squatting, stairs, and reach over head  PARTICIPATION LIMITATIONS: meal prep, cleaning, laundry, driving, shopping, and community activity  PERSONAL  FACTORS: Time since onset of injury/illness/exacerbation and 1-2 comorbidities: chronic knee pain, diabetes, prior history of dizziness  are also affecting patient's functional outcome.   REHAB POTENTIAL: Good  CLINICAL DECISION MAKING: Stable/uncomplicated  EVALUATION COMPLEXITY: Low  PLAN:  PT FREQUENCY: 2x/week  PT DURATION: 8 weeks  PLANNED INTERVENTIONS: 97164- PT Re-evaluation, 97110-Therapeutic exercises, 97530- Therapeutic activity, 97112- Neuromuscular re-education, 97535- Self Care, 16109- Manual therapy, [phone]- Canalith repositioning, [id number]- Aquatic Therapy, U9811- Electrical stimulation (unattended), [phone]- Electrical stimulation (manual), [id number]- Ultrasound, 29562- Ionotophoresis 4mg /ml Dexamethasone, Patient/Family education, Taping, Dry Needling, Joint mobilization, Vestibular training, Visual/preceptual remediation/compensation, Cryotherapy, and Moist heat  PLAN FOR NEXT SESSION: Vestibular/canalith reposition as indicated; balance ex's; UE/LE functional strengthening    Takeila Thayne April Dell Ponto, PT, DPT 09/13/23, 10:18 AM  [facility] [address] [place], [place] [address] Phone # [phone] Fax [phone]

## 2023-09-15 ENCOUNTER — Ambulatory Visit: Admitting: Physical Therapy

## 2023-09-15 DIAGNOSIS — R42 Dizziness and giddiness: Secondary | ICD-10-CM

## 2023-09-15 DIAGNOSIS — M6281 Muscle weakness (generalized): Secondary | ICD-10-CM

## 2023-09-15 DIAGNOSIS — R2689 Other abnormalities of gait and mobility: Secondary | ICD-10-CM

## 2023-09-15 DIAGNOSIS — M25511 Pain in right shoulder: Secondary | ICD-10-CM | POA: Diagnosis not present

## 2023-09-15 DIAGNOSIS — G8929 Other chronic pain: Secondary | ICD-10-CM

## 2023-09-15 NOTE — Therapy (Signed)
 OUTPATIENT PHYSICAL THERAPY VESTIBULAR NOTE   Patient Name: Jean Smith MRN: [id number] DOB:[birth year], 73 y.o., female Today's Date: 09/15/2023   PCP: Hillard Danker DN REFERRING PROVIDER: Antoine Primas DO  END OF SESSION:  PT End of Session - 09/15/23 1153     Visit Number 4    Date for PT Re-Evaluation 10/26/23    Authorization Type [employer]    Progress Note Due on Visit 10    PT Start Time 1153   late arrival   PT Stop Time 1225    PT Time Calculation (min) 32 min    Activity Tolerance Patient tolerated treatment well    Behavior During Therapy [facility] for tasks assessed/performed             Past Medical History:  Diagnosis Date   Allergy    Anxiety    Aortic regurgitation    moderate by echo 10/2020   Arthritis    Cancer (HCC)    skin   Cataract    bilateral - MD is just watching    Diverticulosis    Fuchs' endothelial dystrophy    follows with optho regularly    Gestational diabetes    Heart murmur    MVP    History of depression    HSV-2 infection    Hyperlipidemia    Hyperplastic colon polyp    Mitral valve prolapse    mild to moderate MR by echo 10/2020   PVC's (premature ventricular contractions)    intol of BB, sxc palpitations r/t stress   RVOT ventricular tachycardia/PVCs    EP eval 01/2013 for freq PVCs   Past Surgical History:  Procedure Laterality Date   CESAREAN SECTION     x 3   COLONOSCOPY     KNEE SURGERY Right    MANDIBLE SURGERY     right side in front of ear   MOHS SURGERY  2021   POLYPECTOMY     WISDOM TOOTH EXTRACTION     Patient Active Problem List   Diagnosis Date Noted   AC (acromioclavicular) arthritis 05/28/2022   Mitral valve prolapse    Aortic regurgitation    NSVT (nonsustained ventricular tachycardia) (HCC) 09/25/2020   Diabetes mellitus type 2 with complications (HCC) 08/27/2020   Chronic pain of both shoulders 05/05/2018   Other fatigue 01/02/2018   Allergic rhinitis 01/02/2018    Routine general medical examination at a health care facility 09/01/2015   Hyperlipidemia associated with type 2 diabetes mellitus (HCC) 09/01/2015   Varicose vein 08/27/2014   Primary localized osteoarthrosis, lower leg 10/30/2013   RVOT ventricular tachycardia/PVCs 01/31/2013   Abnormal stress echo 12/11/2012    ONSET DATE: February   REFERRING DIAG: vertigo, balance disorder, chronic right knee pain; chronic pain of both shoulders  THERAPY DIAG:  Vertigo; dizziness; balance disorder; generalized weakness; right knee pain  Rationale for Evaluation and Treatment: Rehabilitation  SUBJECTIVE:  SUBJECTIVE STATEMENT: Pt states she has a headache. States no spinning but still wobbly. Got a glucose monitor so has been trying to figure it out.   PERTINENT HISTORY:  DM Chronic right knee pain with prior PT ending in Feb (cortisone injection in knee in Feb)  PAIN:   Are you having pain? Yes NPRS scale: 5/10 Pain location: right > left knee pain; ongoing shoulder pain with overhead  Pain orientation: Bilateral  PAIN TYPE: aching Pain description: intermittent  Aggravating factors: turning head to the right;  a little with looking up at the sky (careful in the shower); careful with bending over (getting stuff out of the fridge low shelf); arms overhead; turning head and looking up at the waitress at the restaurant; leaning over the sink; pushing the grocery cart and reaching for items; can't walk the steps (I have to crawl up them) Relieving factors: keeping head still   PRECAUTIONS: risk of falls    WEIGHT BEARING RESTRICTIONS: No  FALLS: Has patient fallen in last 6 months? Yes. Number of falls 1  LIVING ENVIRONMENT: Lives with: lives with their spouse Lives in: House/apartment Stairs: Yes:  Internal: 12 steps; on right going up and External: 5 steps; on right going up Has following equipment at home: None PLOF: Independent  PATIENT GOALS: stop dizziness with head movements; improve balance, make my arms and legs stronger  OBJECTIVE:  Note: Objective measures were completed at Evaluation unless otherwise noted. 05/30/2022 DIAGNOSTIC FINDINGS: LEFT SHOULDER - 3 VIEW   COMPARISON:  None Available.   FINDINGS: There is no evidence of fracture or dislocation. Mild glenohumeral osteophytosis. Moderate acromioclavicular joint space loss and osteophytosis. No soft tissue abnormalities. The visualized left lung is clear.   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     COGNITION: Overall cognitive status: Within functional limits for tasks assessed  Cervical ROM: 25% limited with rotation and extension (slow movement) UPPER EXTREMITY ROM: WFLs UPPER EXTREMITY MMT: shoulders grossly 4/5  LOWER EXTREMITY ROM:    right knee 3-115, left knee 0-120 degrees LOWER EXTREMITY MMT:  right knee extension 4-/5; left knee extension 4/5; left hip abduction 4/5;  right hip abduction 4-/5; SLS on left 9 sec, right 3 sec with pelvic drop indicating glute weakness on right    PATIENT SURVEYS:  Dizziness handicap Inventory 62% Activities specific Balance Confidence Scale: 45.62% (low confidence in balance)  FUNCTIONAL ASSESSMENT:  Eval: BERG balance test:    44  /56 5x STS 14.30 sec no UE assist needed   VESTIBULAR ASSESSMENT: 09/06/2023: Smooth Pursuit:  WFL Gaze Stabilization:  WFL Saccades:  WFL Head Thrust:  positive for nystagmus Dix Hallpike: Negative bilat Supine Head Roll Test:  positive on the right, negative on the left Canalith Repositioning:  Barbecue Roll to treat the right  TODAY'S TREATMENT DATE:  09/15/2023 Nustep L2 x 6 min Standing feet as close together as possible on compliant surface Static balance x30" VOR x1; horizontal and vertical 2x30" each Saccades horizontal and vertical 2x30" each Smooth pursuit horizontal and vertical 2x30" each Slow alternating knee flex/ext x10 Single leg weight shift toe tap fwd/bwd 2x10  DATE: 09/13/2023 Nustep level 5 x6 min with PT present to discuss status Dix-Hallpike: (-) bilat Sidelying: (-) bilat Roll test: (-) bilat Standing feet as close together as possible on firm surface VOR x1; horizontal and vertical 2x30" each Saccades horizontal and vertical 2x30" each Smooth pursuit horizontal and vertical 2x30"  Self care: checking for BPPV and steps to take at home if it occurs, HEP updates   DATE: 09/06/2023 Nustep level 3 x6 min with PT present to discuss status Vestibular Reassessment (see above) Dix Hallpike: Negative bilat Supine Head Roll Test:  positive on the right, negative on the left Canalith Repositioning:  Barbecue Roll to treat the right   DATE: 3/19  Evaluation Discussion on vestibular system impact on balance and canalith repositioning maneuvers to address dizziness with head movements  PATIENT EDUCATION: Education details: Educated patient on anatomy and physiology of current symptoms, prognosis, plan of care as well as initial self care strategies to promote recovery Person educated: Patient Education method: Explanation Education comprehension: verbalized understanding  HOME EXERCISE PROGRAM: Access Code: [email or web address] URL: [email or web address] Date: 09/13/2023 Prepared by: Vernon Prey April Kirstie Peri  Exercises - Brandt-Daroff Vestibular Exercise  - 1 x daily - 7 x weekly - 3-5 reps - Seated Gaze Stabilization with Head Rotation  - 1 x daily - 7 x weekly - 2 sets - 30 reps - Seated Gaze Stabilization with Head Nod  - 1 x daily - 7 x weekly - 2 sets - 30 reps - Standing Feet Together Smooth Pursuit  -  1 x daily - 7 x weekly - 2 sets - 30 sec hold - Standing Horizontal Saccades  - 1 x daily - 7 x weekly - 2 sets - 30 sec hold - Standing Vertical Saccades  - 1 x daily - 7 x weekly - 2 sets - 30 sec hold  GOALS: Goals reviewed with patient? Yes  SHORT TERM GOALS: Target date: 09/28/2023  The patient will demonstrate knowledge of basic self care strategies and exercises to promote healing  Baseline: Goal status: Ongoing  2.  Improvement in dizziness with head movements by 50% Baseline:  Goal status: INITIAL  3.  Patient will be able to lean over the sink and dishwasher with minimal dizziness Baseline:  Goal status: INITIAL  4.  The patient will have an improved BERG balance score to  47  /56 indicating reduced risk of falls  Baseline:  Goal status: INITIAL  5.  The patient will have improved knee and hip strength to at least 4/5 needed for standing, walking longer distances  Baseline:  Goal status: INITIAL   LONG TERM GOALS: Target date: 10/26/2023    The patient will be independent in a safe self progression of a home exercise program to promote further recovery of function  Baseline:  Goal status: INITIAL  2.  The patient will have improved LE strength of at least 4+/5 needed to ascend and descend steps reciprocally with UE support  Baseline:  Goal status: INITIAL  3.  The patient will have an improved BERG balance score to  49/56 indicating reduced risk of falls  Baseline:  Goal status: INITIAL  4.  ABC scale improved to 60% indicating improved confidence in balance Baseline:  Goal status: INITIAL  5.  Dizziness Handicap Inventory improved to 50% Baseline:  Goal status: INITIAL    ASSESSMENT:  CLINICAL IMPRESSION: Continues to have no spinning dizziness. Session focused on progressing balance and vestibular exercises  OBJECTIVE IMPAIRMENTS: decreased activity tolerance, decreased balance, difficulty walking, decreased strength, dizziness, impaired  perceived functional ability, impaired UE functional use, and pain.   ACTIVITY LIMITATIONS: carrying, lifting, bending, squatting, stairs, and reach over head  PARTICIPATION LIMITATIONS: meal prep, cleaning, laundry, driving, shopping, and community activity  PERSONAL FACTORS: Time since onset of injury/illness/exacerbation and 1-2 comorbidities: chronic knee pain, diabetes, prior history of dizziness  are also affecting patient's functional outcome.   REHAB POTENTIAL: Good  CLINICAL DECISION MAKING: Stable/uncomplicated  EVALUATION COMPLEXITY: Low  PLAN:  PT FREQUENCY: 2x/week  PT DURATION: 8 weeks  PLANNED INTERVENTIONS: 97164- PT Re-evaluation, 97110-Therapeutic exercises, 97530- Therapeutic activity, 97112- Neuromuscular re-education, 97535- Self Care, 16109- Manual therapy, [phone]- Canalith repositioning, [id number]- Aquatic Therapy, U9811- Electrical stimulation (unattended), [phone]- Electrical stimulation (manual), [id number]- Ultrasound, 29562- Ionotophoresis 4mg /ml Dexamethasone, Patient/Family education, Taping, Dry Needling, Joint mobilization, Vestibular training, Visual/preceptual remediation/compensation, Cryotherapy, and Moist heat  PLAN FOR NEXT SESSION: Vestibular/canalith reposition as indicated; balance ex's; UE/LE functional strengthening    Mariaclara Spear April Dell Ponto, PT, DPT 09/15/23, 11:53 AM  [facility] [address] [place], [place] [address] Phone # [phone] Fax [phone]

## 2023-09-20 ENCOUNTER — Ambulatory Visit: Admitting: Physical Therapy

## 2023-09-20 ENCOUNTER — Encounter: Payer: Self-pay | Admitting: Physical Therapy

## 2023-09-20 DIAGNOSIS — M6281 Muscle weakness (generalized): Secondary | ICD-10-CM

## 2023-09-20 DIAGNOSIS — M25511 Pain in right shoulder: Secondary | ICD-10-CM | POA: Diagnosis not present

## 2023-09-20 DIAGNOSIS — R2689 Other abnormalities of gait and mobility: Secondary | ICD-10-CM

## 2023-09-20 DIAGNOSIS — G8929 Other chronic pain: Secondary | ICD-10-CM

## 2023-09-20 NOTE — Therapy (Signed)
 OUTPATIENT PHYSICAL THERAPY VESTIBULAR / ORTHO NOTE   Patient Name: Jean Smith MRN: [id number] DOB:[birth year], 73 y.o., female Today's Date: 09/20/2023   PCP: Hillard Danker DN REFERRING PROVIDER: Antoine Primas DO  END OF SESSION:  PT End of Session - 09/20/23 1156     Visit Number 5    Date for PT Re-Evaluation 10/26/23    Authorization Type [employer]    Progress Note Due on Visit 10    PT Start Time 1150    PT Stop Time 1230    PT Time Calculation (min) 40 min    Activity Tolerance Patient tolerated treatment well    Behavior During Therapy [facility] for tasks assessed/performed              Past Medical History:  Diagnosis Date   Allergy    Anxiety    Aortic regurgitation    moderate by echo 10/2020   Arthritis    Cancer (HCC)    skin   Cataract    bilateral - MD is just watching    Diverticulosis    Fuchs' endothelial dystrophy    follows with optho regularly    Gestational diabetes    Heart murmur    MVP    History of depression    HSV-2 infection    Hyperlipidemia    Hyperplastic colon polyp    Mitral valve prolapse    mild to moderate MR by echo 10/2020   PVC's (premature ventricular contractions)    intol of BB, sxc palpitations r/t stress   RVOT ventricular tachycardia/PVCs    EP eval 01/2013 for freq PVCs   Past Surgical History:  Procedure Laterality Date   CESAREAN SECTION     x 3   COLONOSCOPY     KNEE SURGERY Right    MANDIBLE SURGERY     right side in front of ear   MOHS SURGERY  2021   POLYPECTOMY     WISDOM TOOTH EXTRACTION     Patient Active Problem List   Diagnosis Date Noted   AC (acromioclavicular) arthritis 05/28/2022   Mitral valve prolapse    Aortic regurgitation    NSVT (nonsustained ventricular tachycardia) (HCC) 09/25/2020   Diabetes mellitus type 2 with complications (HCC) 08/27/2020   Chronic pain of both shoulders 05/05/2018   Other fatigue 01/02/2018   Allergic rhinitis 01/02/2018   Routine  general medical examination at a health care facility 09/01/2015   Hyperlipidemia associated with type 2 diabetes mellitus (HCC) 09/01/2015   Varicose vein 08/27/2014   Primary localized osteoarthrosis, lower leg 10/30/2013   RVOT ventricular tachycardia/PVCs 01/31/2013   Abnormal stress echo 12/11/2012    ONSET DATE: February   REFERRING DIAG: vertigo, balance disorder, chronic right knee pain; chronic pain of both shoulders  THERAPY DIAG:  Vertigo; dizziness; balance disorder; generalized weakness; right knee pain  Rationale for Evaluation and Treatment: Rehabilitation  SUBJECTIVE:  SUBJECTIVE STATEMENT: I got a new glucose monitor and it seems like it fluctuates but I'm still learning. I continue to crawl up the steps due to my knees.  I like the balance exercises we do here.  PERTINENT HISTORY:  DM Chronic right knee pain with prior PT ending in Feb (cortisone injection in knee in Feb)  PAIN:   Are you having pain? Yes NPRS scale: 5/10 Pain location: right > left knee pain; ongoing shoulder pain with overhead  Pain orientation: Bilateral  PAIN TYPE: aching Pain description: intermittent  Aggravating factors: turning head to the right;  a little with looking up at the sky (careful in the shower); careful with bending over (getting stuff out of the fridge low shelf); arms overhead; turning head and looking up at the waitress at the restaurant; leaning over the sink; pushing the grocery cart and reaching for items; can't walk the steps (I have to crawl up them) Relieving factors: keeping head still   PRECAUTIONS: risk of falls    WEIGHT BEARING RESTRICTIONS: No  FALLS: Has patient fallen in last 6 months? Yes. Number of falls 1  LIVING ENVIRONMENT: Lives with: lives with their  spouse Lives in: House/apartment Stairs: Yes: Internal: 12 steps; on right going up and External: 5 steps; on right going up Has following equipment at home: None PLOF: Independent  PATIENT GOALS: stop dizziness with head movements; improve balance, make my arms and legs stronger  OBJECTIVE:  Note: Objective measures were completed at Evaluation unless otherwise noted. 05/30/2022 DIAGNOSTIC FINDINGS: LEFT SHOULDER - 3 VIEW   COMPARISON:  None Available.   FINDINGS: There is no evidence of fracture or dislocation. Mild glenohumeral osteophytosis. Moderate acromioclavicular joint space loss and osteophytosis. No soft tissue abnormalities. The visualized left lung is clear.   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     COGNITION: Overall cognitive status: Within functional limits for tasks assessed  Cervical ROM: 25% limited with rotation and extension (slow movement) UPPER EXTREMITY ROM: WFLs UPPER EXTREMITY MMT: shoulders grossly 4/5  LOWER EXTREMITY ROM:    right knee 3-115, left knee 0-120 degrees LOWER EXTREMITY MMT:  right knee extension 4-/5; left knee extension 4/5; left hip abduction 4/5;  right hip abduction 4-/5; SLS on left 9 sec, right 3 sec with pelvic drop indicating glute weakness on right    PATIENT SURVEYS:  Dizziness handicap Inventory 62% Activities specific Balance Confidence Scale: 45.62% (low confidence in balance)  FUNCTIONAL ASSESSMENT:  Eval: BERG balance test:    44  /56 5x STS 14.30 sec no UE assist needed   VESTIBULAR ASSESSMENT: 09/06/2023: Smooth Pursuit:  WFL Gaze Stabilization:  WFL Saccades:  WFL Head Thrust:  positive for nystagmus Dix Hallpike: Negative bilat Supine Head Roll Test:  positive on the right, negative on the left Canalith Repositioning:  Barbecue Roll to treat the right  TODAY'S TREATMENT DATE: 09/20/2023 Recumbant bike L2 x 5' PT present to monitor and discuss status Standing on foam pad: narrow BOS 2x30" balance  Pt with sugar drop to 78, got Pt a mint and sat down for therex Seated ball squeeze 10x5" Seated march red loop at knees x20 Seated hip abd red loop x20 Seated knee flexion red loop at ankles 2x10 bil LAQ 1.5lb with ball squeeze 10x5" holds alt LE Cut and tied blue tband for HEP additions seated for LE strength Monitored glucose level which dropped as low as 66 but rose to 75 and was climbing after ate mint - advised to call MD for advice   DATE: 09/15/2023 Nustep L2 x 6 min Standing feet as close together as possible on compliant surface Static balance x30" VOR x1; horizontal and vertical 2x30" each Saccades horizontal and vertical 2x30" each Smooth pursuit horizontal and vertical 2x30" each Slow alternating knee flex/ext x10 Single leg weight shift toe tap fwd/bwd 2x10  DATE: 09/13/2023 Nustep level 5 x6 min with PT present to discuss status Dix-Hallpike: (-) bilat Sidelying: (-) bilat Roll test: (-) bilat Standing feet as close together as possible on firm surface VOR x1; horizontal and vertical 2x30" each Saccades horizontal and vertical 2x30" each Smooth pursuit horizontal and vertical 2x30"  Self care: checking for BPPV and steps to take at home if it occurs, HEP updates   DATE: 09/06/2023 Nustep level 3 x6 min with PT present to discuss status Vestibular Reassessment (see above) Dix Hallpike: Negative bilat Supine Head Roll Test:  positive on the right, negative on the left Canalith Repositioning:  Barbecue Roll to treat the right   PATIENT EDUCATION: Education details: Educated patient on anatomy and physiology of current symptoms, prognosis, plan of care as well as initial self care strategies to promote recovery Person educated: Patient Education method: Explanation Education comprehension: verbalized  understanding  HOME EXERCISE PROGRAM: Access Code: [email or web address] URL: [email or web address] Date: 09/20/2023 Prepared by: Morton Peters  Exercises - Brandt-Daroff Vestibular Exercise  - 1 x daily - 7 x weekly - 3-5 reps - Seated Gaze Stabilization with Head Rotation  - 1 x daily - 7 x weekly - 2 sets - 30 reps - Seated Gaze Stabilization with Head Nod  - 1 x daily - 7 x weekly - 2 sets - 30 reps - Standing Feet Together Smooth Pursuit  - 1 x daily - 7 x weekly - 2 sets - 30 sec hold - Standing Horizontal Saccades  - 1 x daily - 7 x weekly - 2 sets - 30 sec hold - Standing Vertical Saccades  - 1 x daily - 7 x weekly - 2 sets - 30 sec hold - Seated March with Resistance  - 1 x daily - 7 x weekly - 2 sets - 20 reps - Seated Hip Abduction with Resistance  - 1 x daily - 7 x weekly - 2 sets - 10 reps - Seated Hamstring Curls with Resistance  - 1 x daily - 7 x weekly - 2 sets - 10 reps - Seated Long Arc Quad with Hip Adduction  - 1 x daily - 7 x weekly - 2 sets - 10 reps  GOALS: Goals reviewed with patient? Yes  SHORT TERM GOALS: Target date: 09/28/2023  The patient will demonstrate knowledge of basic self care strategies and exercises to promote healing  Baseline: Goal status: Ongoing  2.  Improvement in dizziness with head movements by 50% Baseline:  Goal status: INITIAL  3.  Patient will be able to lean over the sink and dishwasher with minimal dizziness Baseline:  Goal status: INITIAL  4.  The patient will have an improved BERG balance score to  47  /56 indicating reduced risk of falls  Baseline:  Goal status: INITIAL  5.  The patient will have improved knee and hip strength to at least 4/5 needed for standing, walking longer distances  Baseline:  Goal status: INITIAL   LONG TERM GOALS: Target date: 10/26/2023    The patient will be independent in a safe self progression of a home exercise program to promote further recovery of function  Baseline:  Goal  status: INITIAL  2.  The patient will have improved LE strength of at least 4+/5 needed to ascend and descend steps reciprocally with UE support  Baseline:  Goal status: INITIAL  3.  The patient will have an improved BERG balance score to  49/56 indicating reduced risk of falls  Baseline:  Goal status: INITIAL  4.  ABC scale improved to 60% indicating improved confidence in balance Baseline:  Goal status: INITIAL  5.  Dizziness Handicap Inventory improved to 50% Baseline:  Goal status: INITIAL    ASSESSMENT:  CLINICAL IMPRESSION: Pt started using glucose monitor and had a signif drop with bike warm up.  PT got Pt a mint and spend most of visit focused on seated LE strength for knees.  Pt has been compliant with vestibular therex and has a foam pad she uses at home.  Updated HEP and gave blue tied tband for LE therex progression today.   Encouraged her to call MD who gave her the glucose monitor to get advice on blood sugar and diet.  OBJECTIVE IMPAIRMENTS: decreased activity tolerance, decreased balance, difficulty walking, decreased strength, dizziness, impaired perceived functional ability, impaired UE functional use, and pain.   ACTIVITY LIMITATIONS: carrying, lifting, bending, squatting, stairs, and reach over head  PARTICIPATION LIMITATIONS: meal prep, cleaning, laundry, driving, shopping, and community activity  PERSONAL FACTORS: Time since onset of injury/illness/exacerbation and 1-2 comorbidities: chronic knee pain, diabetes, prior history of dizziness  are also affecting patient's functional outcome.   REHAB POTENTIAL: Good  CLINICAL DECISION MAKING: Stable/uncomplicated  EVALUATION COMPLEXITY: Low  PLAN:  PT FREQUENCY: 2x/week  PT DURATION: 8 weeks  PLANNED INTERVENTIONS: 97164- PT Re-evaluation, 97110-Therapeutic exercises, 97530- Therapeutic activity, 97112- Neuromuscular re-education, 97535- Self Care, 11914- Manual therapy, [phone]- Canalith repositioning,  [id number]- Aquatic Therapy, A2130- Electrical stimulation (unattended), [phone]- Electrical stimulation (manual), [id number]- Ultrasound, 46962- Ionotophoresis 4mg /ml Dexamethasone, Patient/Family education, Taping, Dry Needling, Joint mobilization, Vestibular training, Visual/preceptual remediation/compensation, Cryotherapy, and Moist heat  PLAN FOR NEXT SESSION: Vestibular/canalith reposition as indicated; balance ex's; UE/LE functional strengthening   Morton Peters, PT 09/20/23 12:40 PM   [facility] [address], [address] [place], [place] [address] Phone # [phone] Fax [phone]

## 2023-09-21 ENCOUNTER — Ambulatory Visit

## 2023-09-23 ENCOUNTER — Encounter: Payer: Self-pay | Admitting: Internal Medicine

## 2023-09-23 ENCOUNTER — Ambulatory Visit: Admitting: Rehabilitative and Restorative Service Providers"

## 2023-09-23 ENCOUNTER — Ambulatory Visit: Admitting: Internal Medicine

## 2023-09-23 VITALS — BP 124/70 | HR 103 | Ht 64.0 in | Wt 176.0 lb

## 2023-09-23 DIAGNOSIS — E1169 Type 2 diabetes mellitus with other specified complication: Secondary | ICD-10-CM

## 2023-09-23 DIAGNOSIS — E1165 Type 2 diabetes mellitus with hyperglycemia: Secondary | ICD-10-CM

## 2023-09-23 DIAGNOSIS — E785 Hyperlipidemia, unspecified: Secondary | ICD-10-CM

## 2023-09-23 LAB — POCT GLYCOSYLATED HEMOGLOBIN (HGB A1C): Hemoglobin A1C: 6.7 % — AB (ref 4.0–5.6)

## 2023-09-23 NOTE — Addendum Note (Signed)
 Addended by: Pollie Meyer on: 09/23/2023 01:11 PM   Modules accepted: Orders

## 2023-09-23 NOTE — Progress Notes (Signed)
 Patient ID: Jean Smith, female   DOB: 04-Feb-1951, 73 y.o.   MRN: [id number]  HPI: Jean Smith is a 73 y.o.-year-old female, initially referred by Dr. Terrilee Files, returning for follow-up for DM2, dx in 03/2023, non-insulin-dependent, without long-term complications.  I previously saw the patient ~10 years ago for fatigue, palpitations, and dizziness with essentially negative workup.  Our last visit was 3.5 months ago.  Interim history: No increased urination, blurry vision, nausea, chest pain.   Since last visit, she did not develop dizziness (vertigo) initiating vestibular rehab.  Reviewed history: Patient has a history of GDM, then developed prediabetes.  She was diagnosed with diabetes in 03/2023.  She mentions that this diagnosis came after several years of significant stress.  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  Reviewed HbA1c: Lab Results  Component Value Date   HGBA1C 6.4 (A) 06/09/2023   HGBA1C 6.5 03/24/2023   HGBA1C 6.5 11/09/2022   HGBA1C 6.4 11/05/2021   HGBA1C 6.4 (A) 02/25/2021   HGBA1C 6.4 08/25/2020   HGBA1C 6.3 05/20/2020   HGBA1C 6.4 01/02/2018   HGBA1C 6.3 09/01/2015   HGBA1C 5.9 01/23/2013   Pt is not on any medication for her diabetes.  She started on a freestyle libre 3 CGM in the last 2 weeks:   Lowest: 60 Highest: 250  Glucometer: none  Pt's meals are: - Breakfast: 2 eggs or oats with blueberries - Lunch: Soup or vegetables or [place] - Dinner: Spaghetti with chicken or salmon, salad - Snacks: 2-3: Apple, almond butter, chocolate She is exercising - walking, Pilates, strength training, stretching, and yoga 1-2 times a week. Has a a stationary bike at home.  - no CKD, last BUN/creatinine:  Lab Results  Component Value Date   BUN 23 08/12/2023   BUN 17 11/09/2022   CREATININE 0.78 08/12/2023   CREATININE 0.80 11/09/2022  No components  found for: "MICRALCREAT"  -+ HL; last set of lipids: Lab Results  Component Value Date   CHOL 201 (H) 11/09/2022   HDL 80.00 11/09/2022   LDLCALC 103 (H) 11/09/2022   LDLDIRECT 168.0 01/02/2018   TRIG 92.0 11/09/2022   CHOLHDL 3 11/09/2022  He is Crestor 5 mg daily.  - last eye exam was 06/2022: No DR. Elmer Picker). Going next week.  - no numbness and tingling in her feet. She sees ortho.  Last foot exam 03/24/2023.  Pt has FH of DM in uncle, aunt - alcoholism.  I reviewed my previous note from 2014 when she presented with fatigue, palpitations, and dizziness: Patient mentioned that she had anxiety (GAD) but felt well up until Spring of 2014 when she developed dizziness that started during her daily walks in 11/2012 >> saw Dr Elease Hashimoto >> NSVT on Holter monitor >> started beta blocker (Propranolol q 6 h) >> nausea, SOB, numbness, dysphagia >> had to go to the ED >> thought to be 2/2 anxiety (is on benzodiazepines) >> event monitor in 01/2013 >> ED again: PVCs (despite Xanax and Propranolol). She decided to see endocrinology.  At the time of the 2014 visit, she c/o: - + weight gain > 10 lbs - + flushing >> 5HIAA urinary - normal, cathecolamines reportedly normal - gets red in the face and on neck - ? If they are Hot flushes as she never had them when she actually went through menopause 5 years prior - +  increased pulse, decreased BP - only on prn Propranolol - + anxiety - Xanax - + dizziness - especially with raising hands and washing hair, for e.g. Or when exercising with elastic bands at [employer] - + palpitations - + lightheaded before meals and ~1.5 hours post meals, but mostly after Propranolol - + spider veins on legs have increased - + bloated and occasionally RLQ pain - she feels unwell, cannot describe better, thinks she may be depressed, too.   At that time, her HbA1c was 5.9%, slightly high x2 giving her a diagnosis of prediabetes.  If her meter at that time to check blood  sugars whenever feeling dizzy to check for reactive hypoglycemia. ESR was very slightly elevated. Thyroid tests, CBC, CMP, 5 HIAA (for carcinoid), celiac blood workup, cortisol level were all normal. She saw cardiology: ruled out for POTS, and she had normal 2-D echo and stress test. She did have a 24 HR monitor that showed, PVCs and several runs of NSVT. She also has a history of MVP.  Patient also has a history of aortic regurgitation, osteopenia, GERD, skin cancer  ROS: + see HPI  Past Medical History:  Diagnosis Date   Allergy    Anxiety    Aortic regurgitation    moderate by echo 10/2020   Arthritis    Cancer (HCC)    skin   Cataract    bilateral - MD is just watching    Diverticulosis    Fuchs' endothelial dystrophy    follows with optho regularly    Gestational diabetes    Heart murmur    MVP    History of depression    HSV-2 infection    Hyperlipidemia    Hyperplastic colon polyp    Mitral valve prolapse    mild to moderate MR by echo 10/2020   PVC's (premature ventricular contractions)    intol of BB, sxc palpitations r/t stress   RVOT ventricular tachycardia/PVCs    EP eval 01/2013 for freq PVCs   Past Surgical History:  Procedure Laterality Date   CESAREAN SECTION     x 3   COLONOSCOPY     KNEE SURGERY Right    MANDIBLE SURGERY     right side in front of ear   MOHS SURGERY  2021   POLYPECTOMY     WISDOM TOOTH EXTRACTION     Social History   Socioeconomic History   Marital status: Married    Spouse name: Not on file   Number of children: 3   Years of education: Not on file   Highest education level: Some college, no degree  Occupational History   Not on file  Tobacco Use   Smoking status: Former    Current packs/day: 0.00    Types: Cigarettes    Quit date: 06/15/1983    Years since quitting: 40.3   Smokeless tobacco: Never  Vaping Use   Vaping status: Never Used  Substance and Sexual Activity   Alcohol use: Not Currently    Comment:  occasional   Drug use: No   Sexual activity: Not Currently    Birth control/protection: Post-menopausal  Other Topics Concern   Not on file  Social History Narrative   Regular exercise: yes   Caffeine use: none   Social Drivers of Corporate investment banker Strain: Low Risk  (03/23/2023)   Overall Financial Resource Strain (CARDIA)    Difficulty of Paying Living Expenses: Not hard at all  Food Insecurity: No Food  Insecurity (03/23/2023)   Hunger Vital Sign    Worried About Running Out of Food in the Last Year: Never true    Ran Out of Food in the Last Year: Never true  Transportation Needs: No Transportation Needs (03/23/2023)   PRAPARE - Administrator, Civil Service (Medical): No    Lack of Transportation (Non-Medical): No  Physical Activity: Unknown (03/23/2023)   Exercise Vital Sign    Days of Exercise per Week: Patient declined    Minutes of Exercise per Session: Not on file  Stress: Stress Concern Present (03/23/2023)   [employer] of Occupational Health - Occupational Stress Questionnaire    Feeling of Stress : To some extent  Social Connections: Socially Integrated (03/23/2023)   Social Connection and Isolation Panel [NHANES]    Frequency of Communication with Friends and Family: Three times a week    Frequency of Social Gatherings with Friends and Family: Patient declined    Attends Religious Services: 1 to 4 times per year    Active Member of [employer] or Organizations: Yes    Attends Banker Meetings: 1 to 4 times per year    Marital Status: Married  Catering manager Violence: Not At Risk (06/16/2021)   Humiliation, Afraid, Rape, and Kick questionnaire    Fear of Current or Ex-Partner: No    Emotionally Abused: No    Physically Abused: No    Sexually Abused: No   Current Outpatient Medications on File Prior to Visit  Medication Sig Dispense Refill   ALPRAZolam (XANAX) 0.25 MG tablet TAKE 1 TABLET BY MOUTH EVERY DAY AS NEEDED FOR ANXIETY 20  tablet 0   ascorbic acid (VITAMIN C) 500 MG tablet Take 500 mg by mouth every 3 (three) days. [phone] MG     b complex vitamins capsule Take 1 capsule by mouth every 3 (three) days.     Cholecalciferol (VITAMIN D-3) 125 MCG (5000 UT) TABS Take 1 tablet by mouth every 3 (three) days.     Continuous Glucose Sensor (FREESTYLE LIBRE 3 PLUS SENSOR) MISC 1 each by Does not apply route every 14 (fourteen) days. 6 each 3   loratadine (CLARITIN) 10 MG tablet Take 10 mg by mouth daily as needed for allergies.     MAGNESIUM GLUCONATE PO Take 400 mg by mouth daily.     meclizine (ANTIVERT) 12.5 MG tablet Take 1 tablet (12.5 mg total) by mouth 3 (three) times daily as needed for dizziness. 16 tablet 0   propranolol (INDERAL) 10 MG tablet Take 10 mg by mouth as needed (increased HR and skipped beats).     rosuvastatin (CRESTOR) 5 MG tablet Take 1 tablet (5 mg total) by mouth daily. 90 tablet 3   Turmeric (QC TUMERIC COMPLEX PO) Take by mouth every 3 (three) days.     No current facility-administered medications on file prior to visit.   Allergies  Allergen Reactions   Latex Swelling   Other     Grass, local trees, cats and dogs   Pollen Extract    Family History  Problem Relation Age of Onset   Colon cancer Mother        dx'd in her 22's-- stage 2    Hypertension Father    Heart disease Father    Hyperlipidemia Father    Thyroid disease Sister    Colon polyps Sister    Colon polyps Sister    Colon cancer Maternal Grandmother    Colon polyps Daughter  Alcohol abuse Other    Rectal cancer Neg Hx    Stomach cancer Neg Hx    Esophageal cancer Neg Hx    PE: BP 124/70   Pulse (!) 103   Ht 5\' 4"  (1.626 m)   Wt 176 lb (79.8 kg)   SpO2 97%   BMI 30.21 kg/m  Wt Readings from Last 10 Encounters:  09/23/23 176 lb (79.8 kg)  08/12/23 170 lb (77.1 kg)  08/10/23 175 lb (79.4 kg)  06/09/23 171 lb 12.8 oz (77.9 kg)  06/02/23 173 lb (78.5 kg)  03/31/23 175 lb (79.4 kg)  03/24/23 173 lb (78.5  kg)  11/09/22 170 lb (77.1 kg)  10/25/22 172 lb 9.6 oz (78.3 kg)  10/21/22 170 lb (77.1 kg)   Constitutional: overweight, in NAD Eyes:  EOMI, no exophthalmos ENT: no neck masses, no cervical lymphadenopathy Cardiovascular: Tachycardia, RR, No MRG Respiratory: CTA B Musculoskeletal: no deformities Skin:no rashes Neurological: no tremor with outstretched hands  ASSESSMENT: 1. DM2, diet controlled, without complications  2. HL  PLAN:  1. Patient with relatively recent diagnosis of type 2 diabetes, diet controlled.  She previously had a history of prediabetes for 10 years.  However, in the last approximately 3 years she has been stressed after her son had a fall and developed traumatic brain injury.  She was taking care of him for a long time and was not able to be very active and also relaxed her diet.  She gained weight and an HbA1c was in the diabetic range in 10/2022 and still elevated, at 6.5% in 03/2023.  At last visit, this was slightly lower, at 6.4%. - At last visit, we discussed that her HbA1c values were borderline between prediabetes and diabetes and at that point, her diabetes could still be reversible.  I recommended to pay close attention to diet and also to exercise consistently.  She was exercising at that time but I recommended to add some more cardio.  She had a stationary bike at home and she was planning to start using it.  She was also very much interested in a CGM and I agreed with her that this could be quite helpful in understanding her blood sugar patterns.  She was determined to pay for it out-of-pocket if not covered by insurance.  We did discuss that if the sugars remain elevated or increase further, we can add metformin, which is the first-line therapy for diabetes.  A GLP-1 receptor agonist would be another option especially if she was very interested in losing weight.  However, we did discuss that the long-term consequences of using these medications were not known.   At last visit we discussed about how to improve diet and also given written suggestions.  She wants to hold off on referral to nutrition in the time. CGM interpretation: -At today's visit, we reviewed her CGM downloads: It appears that 98% of values are in target range (goal >70%), while 2% are higher than 180 (goal <25%), and 0% are lower than 70 (goal <4%).  The calculated average blood sugar is 115.  The projected HbA1c for the next 3 months (GMI) is 6.1%. -Reviewing the CGM trends, sugars appear to be minimally fluctuating, mostly within the target range with higher blood sugars during the night, likely after snack/dinner.  She does describe instances in which sugars are increasing significantly after a meal and then they drop abruptly.  We discussed that this is not unusual with some of the meals and I advised  her how to avoid the significant fluctuations including starting the meal with protein, fiber, and fat, and then with carbs but also trying to eat slower, as she mentions that she is eating very fast.  As of now, she is also overreacting to lower blood sugars and we discussed that she may not need to do so.  I suspect that this may be the reason for the higher HbA1c and also the few pound weight gain. - I suggested to:  Patient Instructions  Try to eat slower.  Start the meal with protein, fiber, and fat and end with carbs.   Please come back in 4-6 months.  - we checked her HbA1c: 6.7% (higher) - advised to check sugars at different times of the day - 4x a day, rotating check times - advised for yearly eye exams >> she is due but has an appointment coming up - return to clinic in 4-6 months  2. HL - Reviewed latest lipid panel from 10/2022: Fractions at goal with the exception of LDL above target, but much improved in the previous 5 years: Lab Results  Component Value Date   CHOL 201 (H) 11/09/2022   HDL 80.00 11/09/2022   LDLCALC 103 (H) 11/09/2022   LDLDIRECT 168.0 01/02/2018    TRIG 92.0 11/09/2022   CHOLHDL 3 11/09/2022  - She is on Crestor 5 mg daily with good tolerance  Carlus Pavlov, MD PhD [facility]

## 2023-09-23 NOTE — Patient Instructions (Addendum)
 Try to eat slower.  Start the meal with protein, fiber, and fat and end with carbs.   Please come back in 4-6 months.

## 2023-09-24 ENCOUNTER — Encounter: Payer: Self-pay | Admitting: Internal Medicine

## 2023-09-24 LAB — MICROALBUMIN / CREATININE URINE RATIO
Creatinine, Urine: 18 mg/dL — ABNORMAL LOW (ref 20–275)
Microalb, Ur: <0.2 mg/dL

## 2023-09-26 ENCOUNTER — Encounter: Payer: Self-pay | Admitting: Rehabilitative and Restorative Service Providers"

## 2023-09-26 ENCOUNTER — Ambulatory Visit: Admitting: Rehabilitative and Restorative Service Providers"

## 2023-09-26 DIAGNOSIS — R2689 Other abnormalities of gait and mobility: Secondary | ICD-10-CM

## 2023-09-26 DIAGNOSIS — G8929 Other chronic pain: Secondary | ICD-10-CM

## 2023-09-26 DIAGNOSIS — M25511 Pain in right shoulder: Secondary | ICD-10-CM | POA: Diagnosis not present

## 2023-09-26 DIAGNOSIS — H8113 Benign paroxysmal vertigo, bilateral: Secondary | ICD-10-CM

## 2023-09-26 DIAGNOSIS — M6281 Muscle weakness (generalized): Secondary | ICD-10-CM

## 2023-09-26 DIAGNOSIS — R42 Dizziness and giddiness: Secondary | ICD-10-CM

## 2023-09-26 NOTE — Therapy (Signed)
 OUTPATIENT PHYSICAL THERAPY VESTIBULAR / ORTHO NOTE   Patient Name: Jean Smith MRN: [id number] DOB:[birth year], 73 y.o., female Today's Date: 09/26/2023   PCP: Bambi Lever DN REFERRING PROVIDER: Ronnell Coins DO  END OF SESSION:  PT End of Session - 09/26/23 1536     Visit Number 6    Date for PT Re-Evaluation 10/26/23    Authorization Type [employer]    Progress Note Due on Visit 10    PT Start Time 1532    PT Stop Time 1610    PT Time Calculation (min) 38 min    Activity Tolerance Patient tolerated treatment well    Behavior During Therapy [facility] for tasks assessed/performed              Past Medical History:  Diagnosis Date   Allergy    Anxiety    Aortic regurgitation    moderate by echo 10/2020   Arthritis    Cancer (HCC)    skin   Cataract    bilateral - MD is just watching    Diverticulosis    Fuchs' endothelial dystrophy    follows with optho regularly    Gestational diabetes    Heart murmur    MVP    History of depression    HSV-2 infection    Hyperlipidemia    Hyperplastic colon polyp    Mitral valve prolapse    mild to moderate MR by echo 10/2020   PVC's (premature ventricular contractions)    intol of BB, sxc palpitations r/t stress   RVOT ventricular tachycardia/PVCs    EP eval 01/2013 for freq PVCs   Past Surgical History:  Procedure Laterality Date   CESAREAN SECTION     x 3   COLONOSCOPY     KNEE SURGERY Right    MANDIBLE SURGERY     right side in front of ear   MOHS SURGERY  2021   POLYPECTOMY     WISDOM TOOTH EXTRACTION     Patient Active Problem List   Diagnosis Date Noted   AC (acromioclavicular) arthritis 05/28/2022   Mitral valve prolapse    Aortic regurgitation    NSVT (nonsustained ventricular tachycardia) (HCC) 09/25/2020   Diabetes mellitus type 2 with complications (HCC) 08/27/2020   Chronic pain of both shoulders 05/05/2018   Other fatigue 01/02/2018   Allergic rhinitis 01/02/2018    Routine general medical examination at a health care facility 09/01/2015   Hyperlipidemia associated with type 2 diabetes mellitus (HCC) 09/01/2015   Varicose vein 08/27/2014   Primary localized osteoarthrosis, lower leg 10/30/2013   RVOT ventricular tachycardia/PVCs 01/31/2013   Abnormal stress echo 12/11/2012    ONSET DATE: February   REFERRING DIAG: vertigo, balance disorder, chronic right knee pain; chronic pain of both shoulders  THERAPY DIAG:  Vertigo; dizziness; balance disorder; generalized weakness; right knee pain  Rationale for Evaluation and Treatment: Rehabilitation  SUBJECTIVE:  SUBJECTIVE STATEMENT: Patient states that she is having increased pain in her cervical region today.  States that dizziness is 50% better since starting PT.  Patient reports that she followed up with the Endocrinologist and they advised her on a better diet and choosing foods better throughout the day.  PERTINENT HISTORY:  DM Chronic right knee pain with prior PT ending in Feb (cortisone injection in knee in Feb)  PAIN:   Are you having pain? Yes NPRS scale: 8/10 Pain location: cervical and shouler Pain orientation: Bilateral  PAIN TYPE: aching Pain description: intermittent  Aggravating factors: turning head to the right;  a little with looking up at the sky (careful in the shower); careful with bending over (getting stuff out of the fridge low shelf); arms overhead; turning head and looking up at the waitress at the restaurant; leaning over the sink; pushing the grocery cart and reaching for items; can't walk the steps (I have to crawl up them) Relieving factors: keeping head still   PRECAUTIONS: risk of falls    WEIGHT BEARING RESTRICTIONS: No  FALLS: Has patient fallen in last 6 months? Yes. Number  of falls 1  LIVING ENVIRONMENT: Lives with: lives with their spouse Lives in: House/apartment Stairs: Yes: Internal: 12 steps; on right going up and External: 5 steps; on right going up Has following equipment at home: None PLOF: Independent  PATIENT GOALS: stop dizziness with head movements; improve balance, make my arms and legs stronger  OBJECTIVE:  Note: Objective measures were completed at Evaluation unless otherwise noted. 05/30/2022 DIAGNOSTIC FINDINGS: LEFT SHOULDER - 3 VIEW   COMPARISON:  None Available.   FINDINGS: There is no evidence of fracture or dislocation. Mild glenohumeral osteophytosis. Moderate acromioclavicular joint space loss and osteophytosis. No soft tissue abnormalities. The visualized left lung is clear.   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     COGNITION: Overall cognitive status: Within functional limits for tasks assessed  Cervical ROM: 25% limited with rotation and extension (slow movement) UPPER EXTREMITY ROM: WFLs UPPER EXTREMITY MMT: shoulders grossly 4/5  LOWER EXTREMITY ROM:    right knee 3-115, left knee 0-120 degrees LOWER EXTREMITY MMT:   Eval:  right knee extension 4-/5; left knee extension 4/5; left hip abduction 4/5;  right hip abduction 4-/5; SLS on left 9 sec, right 3 sec with pelvic drop indicating glute weakness on right   09/26/2023: Bilateral hip strength of grossly 4/5 Bilateral knee strength of grossly 4 to 4+/5   PATIENT SURVEYS:  Eval: Dizziness handicap Inventory 62% Activities specific Balance Confidence Scale: 45.62% (low confidence in balance)  09/26/2023: Total ABC score: 760 / 1600 = 47.5 % DHI Total Score: 56 / 100   FUNCTIONAL ASSESSMENT:  Eval: BERG balance test:    44  /56 5x STS 14.30 sec no UE assist needed   VESTIBULAR ASSESSMENT: 09/06/2023: Smooth Pursuit:  WFL Gaze Stabilization:  WFL Saccades:  WFL Head Thrust:  positive for nystagmus Dix  Hallpike: Negative bilat Supine Head Roll Test:  positive on the right, negative on the left Canalith Repositioning:  Barbecue Roll to treat the right  TODAY'S TREATMENT  DATE: 09/26/2023 Recumbant bike L2 x 5' PT present to monitor and discuss status Seated SNAGs for cervical rotation x10 bilat Seated SNAGs for cervical extension x10 ABC scale and Dizziness Handicapped Inventory Ambulation down PT gym hallway with head nods and cervical rotation x2 each Standing on foam pad in parallel bars:  performing ball toss x20 tosses Standing on foam pad in parallel bars: with eyes closed and close SBA/CGA 2x20 sec   DATE: 09/20/2023 Recumbant bike L2 x 5' PT present to monitor and discuss status Standing on foam pad: narrow BOS 2x30" balance  Pt with sugar drop to 78, got Pt a mint and sat down for therex Seated ball squeeze 10x5" Seated march red loop at knees x20 Seated hip abd red loop x20 Seated knee flexion red loop at ankles 2x10 bil LAQ 1.5lb with ball squeeze 10x5" holds alt LE Cut and tied blue tband for HEP additions seated for LE strength Monitored glucose level which dropped as low as 66 but rose to 75 and was climbing after ate mint - advised to call MD for advice   DATE: 09/15/2023 Nustep L2 x 6 min Standing feet as close together as possible on compliant surface Static balance x30" VOR x1; horizontal and vertical 2x30" each Saccades horizontal and vertical 2x30" each Smooth pursuit horizontal and vertical 2x30" each Slow alternating knee flex/ext x10 Single leg weight shift toe tap fwd/bwd 2x10    PATIENT EDUCATION: Education details: Educated patient on anatomy and physiology of current symptoms, prognosis, plan of care as well as initial self care strategies to promote recovery Person educated: Patient Education method: Explanation Education  comprehension: verbalized understanding  HOME EXERCISE PROGRAM: Access Code: [email or web address] URL: [email or web address] Date: 09/20/2023 Prepared by: Raynell Caller  Exercises - Brandt-Daroff Vestibular Exercise  - 1 x daily - 7 x weekly - 3-5 reps - Seated Gaze Stabilization with Head Rotation  - 1 x daily - 7 x weekly - 2 sets - 30 reps - Seated Gaze Stabilization with Head Nod  - 1 x daily - 7 x weekly - 2 sets - 30 reps - Standing Feet Together Smooth Pursuit  - 1 x daily - 7 x weekly - 2 sets - 30 sec hold - Standing Horizontal Saccades  - 1 x daily - 7 x weekly - 2 sets - 30 sec hold - Standing Vertical Saccades  - 1 x daily - 7 x weekly - 2 sets - 30 sec hold - Seated March with Resistance  - 1 x daily - 7 x weekly - 2 sets - 20 reps - Seated Hip Abduction with Resistance  - 1 x daily - 7 x weekly - 2 sets - 10 reps - Seated Hamstring Curls with Resistance  - 1 x daily - 7 x weekly - 2 sets - 10 reps - Seated Long Arc Quad with Hip Adduction  - 1 x daily - 7 x weekly - 2 sets - 10 reps  GOALS: Goals reviewed with patient? Yes  SHORT TERM GOALS: Target date: 09/28/2023  The patient will demonstrate knowledge of basic self care strategies and exercises to promote healing  Baseline: Goal status: Ongoing  2.  Improvement in dizziness with head movements by 50% Baseline:  Goal status: Met on 09/26/23   3.  Patient will be able to lean over the sink and dishwasher with minimal dizziness Baseline:  Goal status: Ongoing (reports mod dizziness on 09/26/23)  4.  The patient will have an improved  BERG balance score to  47  /56 indicating reduced risk of falls  Baseline:  Goal status: Ongoing  5.  The patient will have improved knee and hip strength to at least 4/5 needed for standing, walking longer distances  Baseline:  Goal status: Met on 09/26/23   LONG TERM GOALS: Target date: 10/26/2023    The patient will be independent in a safe self progression of a home  exercise program to promote further recovery of function  Baseline:  Goal status: Ongoing  2.  The patient will have improved LE strength of at least 4+/5 needed to ascend and descend steps reciprocally with UE support  Baseline:  Goal status: Ongoing  3.  The patient will have an improved BERG balance score to  49/56 indicating reduced risk of falls  Baseline:  Goal status: INITIAL  4.  ABC scale improved to 60% indicating improved confidence in balance Baseline:  Goal status: Ongoing (see above)  5.  Dizziness Handicap Inventory improved to 50% Baseline:  Goal status: Ongoing (see above)    ASSESSMENT:  CLINICAL IMPRESSION: Ms Dissinger presents to skilled PT reporting that she has noticed at least a 50% improvement in dizziness.  States that she goes to the ENT on Thursday of this week.  Patient with improved scores on both ABC and Dizziness Handicapped Inventory assessments.  Patient additionally with improved hip and knee strength noted.  Patient did report decreased pain after cervical SNAGs.  Utilized majority of session to progress with vestibular rehab to assist with improving functioning and decreased dizziness.  Patient continues to require skilled PT to progress towards goal related activities.  OBJECTIVE IMPAIRMENTS: decreased activity tolerance, decreased balance, difficulty walking, decreased strength, dizziness, impaired perceived functional ability, impaired UE functional use, and pain.   ACTIVITY LIMITATIONS: carrying, lifting, bending, squatting, stairs, and reach over head  PARTICIPATION LIMITATIONS: meal prep, cleaning, laundry, driving, shopping, and community activity  PERSONAL FACTORS: Time since onset of injury/illness/exacerbation and 1-2 comorbidities: chronic knee pain, diabetes, prior history of dizziness  are also affecting patient's functional outcome.   REHAB POTENTIAL: Good  CLINICAL DECISION MAKING: Stable/uncomplicated  EVALUATION COMPLEXITY:  Low  PLAN:  PT FREQUENCY: 2x/week  PT DURATION: 8 weeks  PLANNED INTERVENTIONS: 97164- PT Re-evaluation, 97110-Therapeutic exercises, 97530- Therapeutic activity, 97112- Neuromuscular re-education, 97535- Self Care, 96295- Manual therapy, [phone]- Canalith repositioning, [id number]- Aquatic Therapy, [id number]- Electrical stimulation (unattended), [phone]- Electrical stimulation (manual), [id number]- Ultrasound, 36644- Ionotophoresis 4mg /ml Dexamethasone, Patient/Family education, Taping, Dry Needling, Joint mobilization, Vestibular training, Visual/preceptual remediation/compensation, Cryotherapy, and Moist heat  PLAN FOR NEXT SESSION: Vestibular/canalith reposition as indicated; balance ex's; UE/LE functional strengthening   Robyne Christen, PT, DPT 09/26/23, 4:17 PM  [facility] [address] [place], [place] [address] Phone # [phone] Fax [phone]

## 2023-09-28 ENCOUNTER — Ambulatory Visit: Admitting: Rehabilitative and Restorative Service Providers"

## 2023-09-28 ENCOUNTER — Encounter: Payer: Self-pay | Admitting: Rehabilitative and Restorative Service Providers"

## 2023-09-28 DIAGNOSIS — R2689 Other abnormalities of gait and mobility: Secondary | ICD-10-CM

## 2023-09-28 DIAGNOSIS — G8929 Other chronic pain: Secondary | ICD-10-CM

## 2023-09-28 DIAGNOSIS — H8113 Benign paroxysmal vertigo, bilateral: Secondary | ICD-10-CM

## 2023-09-28 DIAGNOSIS — M25511 Pain in right shoulder: Secondary | ICD-10-CM | POA: Diagnosis not present

## 2023-09-28 DIAGNOSIS — M6281 Muscle weakness (generalized): Secondary | ICD-10-CM

## 2023-09-28 DIAGNOSIS — R42 Dizziness and giddiness: Secondary | ICD-10-CM

## 2023-09-28 NOTE — Therapy (Signed)
 OUTPATIENT PHYSICAL THERAPY TREATMENT NOTE   Patient Name: Jean Smith MRN: [id number] DOB:Jan 22, 1951, 73 y.o., female Today's Date: 09/28/2023   PCP: Bambi Lever DN REFERRING PROVIDER: Ronnell Coins DO  END OF SESSION:  PT End of Session - 09/28/23 1106     Visit Number 7    Date for PT Re-Evaluation 10/26/23    Authorization Type [employer]    Progress Note Due on Visit 10    PT Start Time 1102    PT Stop Time 1140    PT Time Calculation (min) 38 min    Activity Tolerance Patient tolerated treatment well    Behavior During Therapy [facility] for tasks assessed/performed              Past Medical History:  Diagnosis Date   Allergy    Anxiety    Aortic regurgitation    moderate by echo 10/2020   Arthritis    Cancer (HCC)    skin   Cataract    bilateral - MD is just watching    Diverticulosis    Fuchs' endothelial dystrophy    follows with optho regularly    Gestational diabetes    Heart murmur    MVP    History of depression    HSV-2 infection    Hyperlipidemia    Hyperplastic colon polyp    Mitral valve prolapse    mild to moderate MR by echo 10/2020   PVC's (premature ventricular contractions)    intol of BB, sxc palpitations r/t stress   RVOT ventricular tachycardia/PVCs    EP eval 01/2013 for freq PVCs   Past Surgical History:  Procedure Laterality Date   CESAREAN SECTION     x 3   COLONOSCOPY     KNEE SURGERY Right    MANDIBLE SURGERY     right side in front of ear   MOHS SURGERY  2021   POLYPECTOMY     WISDOM TOOTH EXTRACTION     Patient Active Problem List   Diagnosis Date Noted   AC (acromioclavicular) arthritis 05/28/2022   Mitral valve prolapse    Aortic regurgitation    NSVT (nonsustained ventricular tachycardia) (HCC) 09/25/2020   Diabetes mellitus type 2 with complications (HCC) 08/27/2020   Chronic pain of both shoulders 05/05/2018   Other fatigue 01/02/2018   Allergic rhinitis 01/02/2018   Routine general  medical examination at a health care facility 09/01/2015   Hyperlipidemia associated with type 2 diabetes mellitus (HCC) 09/01/2015   Varicose vein 08/27/2014   Primary localized osteoarthrosis, lower leg 10/30/2013   RVOT ventricular tachycardia/PVCs 01/31/2013   Abnormal stress echo 12/11/2012    ONSET DATE: February   REFERRING DIAG: vertigo, balance disorder, chronic right knee pain; chronic pain of both shoulders  THERAPY DIAG:  Vertigo; dizziness; balance disorder; generalized weakness; right knee pain  Rationale for Evaluation and Treatment: Rehabilitation  SUBJECTIVE:  SUBJECTIVE STATEMENT: Patient reports that she is having having a lot of congestion, as she is having some allergies.  Patient also reports that she is still having cervical pain of 6/10 and her knee pain is 4/10 today.  PERTINENT HISTORY:  DM Chronic right knee pain with prior PT ending in Feb (cortisone injection in knee in Feb)  PAIN:   Are you having pain? Yes NPRS scale: 6/10 Pain location: cervical and shouler Pain orientation: Bilateral  PAIN TYPE: aching Pain description: intermittent  Aggravating factors: turning head to the right;  a little with looking up at the sky (careful in the shower); careful with bending over (getting stuff out of the fridge low shelf); arms overhead; turning head and looking up at the waitress at the restaurant; leaning over the sink; pushing the grocery cart and reaching for items; can't walk the steps (I have to crawl up them) Relieving factors: keeping head still   PRECAUTIONS: risk of falls    WEIGHT BEARING RESTRICTIONS: No  FALLS: Has patient fallen in last 6 months? Yes. Number of falls 1  LIVING ENVIRONMENT: Lives with: lives with their spouse Lives in:  House/apartment Stairs: Yes: Internal: 12 steps; on right going up and External: 5 steps; on right going up Has following equipment at home: None  PLOF: Independent  PATIENT GOALS: stop dizziness with head movements; improve balance, make my arms and legs stronger  OBJECTIVE:  Note: Objective measures were completed at Evaluation unless otherwise noted. 05/30/2022 DIAGNOSTIC FINDINGS: LEFT SHOULDER - 3 VIEW   COMPARISON:  None Available.   FINDINGS: There is no evidence of fracture or dislocation. Mild glenohumeral osteophytosis. Moderate acromioclavicular joint space loss and osteophytosis. No soft tissue abnormalities. The visualized left lung is clear.   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     COGNITION: Overall cognitive status: Within functional limits for tasks assessed  Cervical ROM: 25% limited with rotation and extension (slow movement) UPPER EXTREMITY ROM: WFLs UPPER EXTREMITY MMT: shoulders grossly 4/5  LOWER EXTREMITY ROM:    right knee 3-115, left knee 0-120 degrees LOWER EXTREMITY MMT:   Eval:  right knee extension 4-/5; left knee extension 4/5; left hip abduction 4/5;  right hip abduction 4-/5; SLS on left 9 sec, right 3 sec with pelvic drop indicating glute weakness on right   09/26/2023: Bilateral hip strength of grossly 4/5 Bilateral knee strength of grossly 4 to 4+/5   PATIENT SURVEYS:  Eval: Dizziness handicap Inventory 62% Activities specific Balance Confidence Scale: 45.62% (low confidence in balance)  09/26/2023: Total ABC score: 760 / 1600 = 47.5 % DHI Total Score: 56 / 100   FUNCTIONAL ASSESSMENT:  Eval: BERG balance test:    44  /56 5x STS 14.30 sec no UE assist needed   VESTIBULAR ASSESSMENT: 09/06/2023: Smooth Pursuit:  WFL Gaze Stabilization:  WFL Saccades:  WFL Head Thrust:  positive for nystagmus Dix Hallpike: Negative bilat Supine Head Roll Test:  positive on the right,  negative on the left Canalith Repositioning:  Barbecue Roll to treat the right  TODAY'S TREATMENT  DATE: 09/28/2023 Recumbant bike L2 x 6 min with PT present to monitor and discuss status Seated SNAGs for cervical rotation x10 bilat Seated SNAGs for cervical extension x10 Seated with 2.5# ankle weights:  LAQ and marching.  2x10 each bilat Dix Hallpike negative bilateral Standing on foam pad in parallel bars:  performing ball toss x20 tosses Standing on wobble board x30 sec with UE support as needed   DATE: 09/26/2023 Recumbant bike L2 x 5' PT present to monitor and discuss status Seated SNAGs for cervical rotation x10 bilat Seated SNAGs for cervical extension x10 ABC scale and Dizziness Handicapped Inventory Ambulation down PT gym hallway with head nods and cervical rotation x2 each Standing on foam pad in parallel bars:  performing ball toss x20 tosses Standing on foam pad in parallel bars: with eyes closed and close SBA/CGA 2x20 sec   DATE: 09/20/2023 Recumbant bike L2 x 5' PT present to monitor and discuss status Standing on foam pad: narrow BOS 2x30" balance  Pt with sugar drop to 78, got Pt a mint and sat down for therex Seated ball squeeze 10x5" Seated march red loop at knees x20 Seated hip abd red loop x20 Seated knee flexion red loop at ankles 2x10 bil LAQ 1.5lb with ball squeeze 10x5" holds alt LE Cut and tied blue tband for HEP additions seated for LE strength Monitored glucose level which dropped as low as 66 but rose to 75 and was climbing after ate mint - advised to call MD for advice    PATIENT EDUCATION: Education details: Educated patient on anatomy and physiology of current symptoms, prognosis, plan of care as well as initial self care strategies to promote recovery Person educated: Patient Education method: Explanation Education  comprehension: verbalized understanding  HOME EXERCISE PROGRAM: Access Code: [email or web address] URL: [email or web address] Date: 09/20/2023 Prepared by: Morton Peters  Exercises - Brandt-Daroff Vestibular Exercise  - 1 x daily - 7 x weekly - 3-5 reps - Seated Gaze Stabilization with Head Rotation  - 1 x daily - 7 x weekly - 2 sets - 30 reps - Seated Gaze Stabilization with Head Nod  - 1 x daily - 7 x weekly - 2 sets - 30 reps - Standing Feet Together Smooth Pursuit  - 1 x daily - 7 x weekly - 2 sets - 30 sec hold - Standing Horizontal Saccades  - 1 x daily - 7 x weekly - 2 sets - 30 sec hold - Standing Vertical Saccades  - 1 x daily - 7 x weekly - 2 sets - 30 sec hold - Seated March with Resistance  - 1 x daily - 7 x weekly - 2 sets - 20 reps - Seated Hip Abduction with Resistance  - 1 x daily - 7 x weekly - 2 sets - 10 reps - Seated Hamstring Curls with Resistance  - 1 x daily - 7 x weekly - 2 sets - 10 reps - Seated Long Arc Quad with Hip Adduction  - 1 x daily - 7 x weekly - 2 sets - 10 reps  GOALS: Goals reviewed with patient? Yes  SHORT TERM GOALS: Target date: 09/28/2023  The patient will demonstrate knowledge of basic self care strategies and exercises to promote healing  Baseline: Goal status: Ongoing  2.  Improvement in dizziness with head movements by 50% Baseline:  Goal status: Met on 09/26/23   3.  Patient will be able to lean over the sink and dishwasher with minimal dizziness Baseline:  Goal status: Ongoing (reports mod dizziness on 09/26/23)  4.  The patient will have an improved BERG balance score to  47  /56 indicating reduced risk of falls  Baseline:  Goal status: Ongoing  5.  The patient will have improved knee and hip strength to at least 4/5 needed for standing, walking longer distances  Baseline:  Goal status: Met on 09/26/23   LONG TERM GOALS: Target date: 10/26/2023    The patient will be independent in a safe self progression of a home  exercise program to promote further recovery of function  Baseline:  Goal status: Ongoing  2.  The patient will have improved LE strength of at least 4+/5 needed to ascend and descend steps reciprocally with UE support  Baseline:  Goal status: Ongoing  3.  The patient will have an improved BERG balance score to  49/56 indicating reduced risk of falls  Baseline:  Goal status: INITIAL  4.  ABC scale improved to 60% indicating improved confidence in balance Baseline:  Goal status: Ongoing (see above)  5.  Dizziness Handicap Inventory improved to 50% Baseline:  Goal status: Ongoing (see above)    ASSESSMENT:  CLINICAL IMPRESSION: Ms Duda presents to skilled PT reporting that she is having some increased tension and feels that it is due to allergies.  Patient with negative Etta Heritage today on bilateral sides.  Patient with continues with difficulty with balance and continues with difficulty with tasks that require increased use of vestibular system.  Patient has an appointment scheduled with the Audiologist tomorrow for further testing, with possible testing with Frezel lenses.  Patient continues to require skilled PT to progress towards goal related activities.  OBJECTIVE IMPAIRMENTS: decreased activity tolerance, decreased balance, difficulty walking, decreased strength, dizziness, impaired perceived functional ability, impaired UE functional use, and pain.   ACTIVITY LIMITATIONS: carrying, lifting, bending, squatting, stairs, and reach over head  PARTICIPATION LIMITATIONS: meal prep, cleaning, laundry, driving, shopping, and community activity  PERSONAL FACTORS: Time since onset of injury/illness/exacerbation and 1-2 comorbidities: chronic knee pain, diabetes, prior history of dizziness  are also affecting patient's functional outcome.   REHAB POTENTIAL: Good  CLINICAL DECISION MAKING: Stable/uncomplicated  EVALUATION COMPLEXITY: Low  PLAN:  PT FREQUENCY: 2x/week  PT  DURATION: 8 weeks  PLANNED INTERVENTIONS: 97164- PT Re-evaluation, 97110-Therapeutic exercises, 97530- Therapeutic activity, 97112- Neuromuscular re-education, 97535- Self Care, 23557- Manual therapy, [phone]- Canalith repositioning, [id number]- Aquatic Therapy, [id number]- Electrical stimulation (unattended), [phone]- Electrical stimulation (manual), [id number]- Ultrasound, 28315- Ionotophoresis 4mg /ml Dexamethasone, Patient/Family education, Taping, Dry Needling, Joint mobilization, Vestibular training, Visual/preceptual remediation/compensation, Cryotherapy, and Moist heat  PLAN FOR NEXT SESSION: Vestibular/canalith reposition as indicated; balance ex's; UE/LE functional strengthening, ask about Audiologist appointment   Robyne Christen, PT, DPT 09/28/23, 11:52 AM  [facility] [address], [address] [place], [place] [address] Phone # [phone] Fax [phone]

## 2023-10-04 ENCOUNTER — Encounter: Payer: Self-pay | Admitting: Rehabilitative and Restorative Service Providers"

## 2023-10-04 ENCOUNTER — Ambulatory Visit: Admitting: Rehabilitative and Restorative Service Providers"

## 2023-10-04 DIAGNOSIS — M25511 Pain in right shoulder: Secondary | ICD-10-CM | POA: Diagnosis not present

## 2023-10-04 DIAGNOSIS — G8929 Other chronic pain: Secondary | ICD-10-CM

## 2023-10-04 DIAGNOSIS — M6281 Muscle weakness (generalized): Secondary | ICD-10-CM

## 2023-10-04 DIAGNOSIS — H8113 Benign paroxysmal vertigo, bilateral: Secondary | ICD-10-CM

## 2023-10-04 DIAGNOSIS — R2689 Other abnormalities of gait and mobility: Secondary | ICD-10-CM

## 2023-10-04 DIAGNOSIS — R42 Dizziness and giddiness: Secondary | ICD-10-CM

## 2023-10-04 NOTE — Therapy (Signed)
 OUTPATIENT PHYSICAL THERAPY TREATMENT NOTE   Patient Name: Jean Smith MRN: [id number] DOB:12/25/1950, 73 y.o., female Today's Date: 10/04/2023   PCP: Bambi Lever DN REFERRING PROVIDER: Ronnell Coins DO  END OF SESSION:  PT End of Session - 10/04/23 1030     Visit Number 8    Date for PT Re-Evaluation 10/26/23    Authorization Type [employer]    Progress Note Due on Visit 10    PT Start Time 1028   Patient arrived late   PT Stop Time 1100    PT Time Calculation (min) 32 min    Activity Tolerance Patient tolerated treatment well    Behavior During Therapy [facility] for tasks assessed/performed              Past Medical History:  Diagnosis Date   Allergy    Anxiety    Aortic regurgitation    moderate by echo 10/2020   Arthritis    Cancer (HCC)    skin   Cataract    bilateral - MD is just watching    Diverticulosis    Fuchs' endothelial dystrophy    follows with optho regularly    Gestational diabetes    Heart murmur    MVP    History of depression    HSV-2 infection    Hyperlipidemia    Hyperplastic colon polyp    Mitral valve prolapse    mild to moderate MR by echo 10/2020   PVC's (premature ventricular contractions)    intol of BB, sxc palpitations r/t stress   RVOT ventricular tachycardia/PVCs    EP eval 01/2013 for freq PVCs   Past Surgical History:  Procedure Laterality Date   CESAREAN SECTION     x 3   COLONOSCOPY     KNEE SURGERY Right    MANDIBLE SURGERY     right side in front of ear   MOHS SURGERY  2021   POLYPECTOMY     WISDOM TOOTH EXTRACTION     Patient Active Problem List   Diagnosis Date Noted   AC (acromioclavicular) arthritis 05/28/2022   Mitral valve prolapse    Aortic regurgitation    NSVT (nonsustained ventricular tachycardia) (HCC) 09/25/2020   Diabetes mellitus type 2 with complications (HCC) 08/27/2020   Chronic pain of both shoulders 05/05/2018   Other fatigue 01/02/2018   Allergic rhinitis  01/02/2018   Routine general medical examination at a health care facility 09/01/2015   Hyperlipidemia associated with type 2 diabetes mellitus (HCC) 09/01/2015   Varicose vein 08/27/2014   Primary localized osteoarthrosis, lower leg 10/30/2013   RVOT ventricular tachycardia/PVCs 01/31/2013   Abnormal stress echo 12/11/2012    ONSET DATE: February   REFERRING DIAG: vertigo, balance disorder, chronic right knee pain; chronic pain of both shoulders  THERAPY DIAG:  Vertigo; dizziness; balance disorder; generalized weakness; right knee pain  Rationale for Evaluation and Treatment: Rehabilitation  SUBJECTIVE:  SUBJECTIVE STATEMENT: Patient reports that she has been having some bad allergies since she has been doing some yard work.  States that her neck is feeling some better, but states some increased knee pain from doing some yard work.  Patient had to reschedule audiologist appointment due to not feeling well.  PERTINENT HISTORY:  DM Chronic right knee pain with prior PT ending in Feb (cortisone injection in knee in Feb)  PAIN:   Are you having pain? Yes NPRS scale: 5/10 Pain location: right knee Pain orientation: Bilateral  PAIN TYPE: aching Pain description: intermittent  Aggravating factors: turning head to the right;  a little with looking up at the sky (careful in the shower); careful with bending over (getting stuff out of the fridge low shelf); arms overhead; turning head and looking up at the waitress at the restaurant; leaning over the sink; pushing the grocery cart and reaching for items; can't walk the steps (I have to crawl up them) Relieving factors: keeping head still   PRECAUTIONS: risk of falls    WEIGHT BEARING RESTRICTIONS: No  FALLS: Has patient fallen in last 6 months? Yes.  Number of falls 1  LIVING ENVIRONMENT: Lives with: lives with their spouse Lives in: House/apartment Stairs: Yes: Internal: 12 steps; on right going up and External: 5 steps; on right going up Has following equipment at home: None  PLOF: Independent  PATIENT GOALS: stop dizziness with head movements; improve balance, make my arms and legs stronger  OBJECTIVE:  Note: Objective measures were completed at Evaluation unless otherwise noted. 05/30/2022 DIAGNOSTIC FINDINGS: LEFT SHOULDER - 3 VIEW   COMPARISON:  None Available.   FINDINGS: There is no evidence of fracture or dislocation. Mild glenohumeral osteophytosis. Moderate acromioclavicular joint space loss and osteophytosis. No soft tissue abnormalities. The visualized left lung is clear.   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     COGNITION: Overall cognitive status: Within functional limits for tasks assessed  Cervical ROM: 25% limited with rotation and extension (slow movement) UPPER EXTREMITY ROM: WFLs UPPER EXTREMITY MMT: shoulders grossly 4/5  LOWER EXTREMITY ROM:    right knee 3-115, left knee 0-120 degrees LOWER EXTREMITY MMT:   Eval:  right knee extension 4-/5; left knee extension 4/5; left hip abduction 4/5;  right hip abduction 4-/5; SLS on left 9 sec, right 3 sec with pelvic drop indicating glute weakness on right   09/26/2023: Bilateral hip strength of grossly 4/5 Bilateral knee strength of grossly 4 to 4+/5   PATIENT SURVEYS:  Eval: Dizziness handicap Inventory 62% Activities specific Balance Confidence Scale: 45.62% (low confidence in balance)  09/26/2023: Total ABC score: 760 / 1600 = 47.5 % DHI Total Score: 56 / 100   FUNCTIONAL ASSESSMENT:  Eval: BERG balance test:    44  /56 5x STS 14.30 sec no UE assist needed   VESTIBULAR ASSESSMENT: 09/06/2023: Smooth Pursuit:  WFL Gaze Stabilization:  WFL Saccades:  WFL Head Thrust:  positive for  nystagmus Dix Hallpike: Negative bilat Supine Head Roll Test:  positive on the right, negative on the left Canalith Repositioning:  Barbecue Roll to treat the right  TODAY'S TREATMENT  DATE: 10/04/2023 (visit length shortened due to late arrival) Recumbant bike L3 x 5 min with PT present to monitor and discuss status Seated with 2.5# ankle weights:  LAQ, hip ER, and marching.  x15 each bilat Standing on foam pad in parallel bars:  performing ball toss x20 tosses Standing on foam pad in parallel bars: performing head nods and head rotations 2x30 sec each Tandem stance on AirEx beam in parallel bars with UE support, as needed, down and back x3 Side stepping on AirEx beam in parallel bars with UE support, as needed, down and back x3 Standing hamstring stretch at stairs 2x20 sec bilat   DATE: 09/28/2023 Recumbant bike L2 x 6 min with PT present to monitor and discuss status Seated SNAGs for cervical rotation x10 bilat Seated SNAGs for cervical extension x10 Seated with 2.5# ankle weights:  LAQ and marching.  2x10 each bilat Dix Hallpike negative bilateral Standing on foam pad in parallel bars:  performing ball toss x20 tosses Standing on wobble board x30 sec with UE support as needed   DATE: 09/26/2023 Recumbant bike L2 x 5' PT present to monitor and discuss status Seated SNAGs for cervical rotation x10 bilat Seated SNAGs for cervical extension x10 ABC scale and Dizziness Handicapped Inventory Ambulation down PT gym hallway with head nods and cervical rotation x2 each Standing on foam pad in parallel bars:  performing ball toss x20 tosses Standing on foam pad in parallel bars: with eyes closed and close SBA/CGA 2x20 sec    PATIENT EDUCATION: Education details: Educated patient on anatomy and physiology of current symptoms, prognosis, plan of care as well as  initial self care strategies to promote recovery Person educated: Patient Education method: Explanation Education comprehension: verbalized understanding  HOME EXERCISE PROGRAM: Access Code: [email or web address] URL: [email or web address] Date: 09/20/2023 Prepared by: Raynell Caller  Exercises - Brandt-Daroff Vestibular Exercise  - 1 x daily - 7 x weekly - 3-5 reps - Seated Gaze Stabilization with Head Rotation  - 1 x daily - 7 x weekly - 2 sets - 30 reps - Seated Gaze Stabilization with Head Nod  - 1 x daily - 7 x weekly - 2 sets - 30 reps - Standing Feet Together Smooth Pursuit  - 1 x daily - 7 x weekly - 2 sets - 30 sec hold - Standing Horizontal Saccades  - 1 x daily - 7 x weekly - 2 sets - 30 sec hold - Standing Vertical Saccades  - 1 x daily - 7 x weekly - 2 sets - 30 sec hold - Seated March with Resistance  - 1 x daily - 7 x weekly - 2 sets - 20 reps - Seated Hip Abduction with Resistance  - 1 x daily - 7 x weekly - 2 sets - 10 reps - Seated Hamstring Curls with Resistance  - 1 x daily - 7 x weekly - 2 sets - 10 reps - Seated Long Arc Quad with Hip Adduction  - 1 x daily - 7 x weekly - 2 sets - 10 reps  GOALS: Goals reviewed with patient? Yes  SHORT TERM GOALS: Target date: 09/28/2023  The patient will demonstrate knowledge of basic self care strategies and exercises to promote healing  Baseline: Goal status: Ongoing  2.  Improvement in dizziness with head movements by 50% Baseline:  Goal status: Met on 09/26/23   3.  Patient will be able to lean over the sink and dishwasher with minimal dizziness Baseline:  Goal status:  Ongoing (reports mod dizziness on 09/26/23)  4.  The patient will have an improved BERG balance score to  47  /56 indicating reduced risk of falls  Baseline:  Goal status: Ongoing  5.  The patient will have improved knee and hip strength to at least 4/5 needed for standing, walking longer distances  Baseline:  Goal status: Met on  09/26/23   LONG TERM GOALS: Target date: 10/26/2023    The patient will be independent in a safe self progression of a home exercise program to promote further recovery of function  Baseline:  Goal status: Ongoing  2.  The patient will have improved LE strength of at least 4+/5 needed to ascend and descend steps reciprocally with UE support  Baseline:  Goal status: Ongoing  3.  The patient will have an improved BERG balance score to  49/56 indicating reduced risk of falls  Baseline:  Goal status: INITIAL  4.  ABC scale improved to 60% indicating improved confidence in balance Baseline:  Goal status: Ongoing (see above)  5.  Dizziness Handicap Inventory improved to 50% Baseline:  Goal status: Ongoing (see above)    ASSESSMENT:  CLINICAL IMPRESSION: Ms Prestage presents to skilled PT reporting that she has to move slowly when walking outside, as she does not want to lose her balance.  Patient does report that overall, her dizziness is better.  Patient with decreased cervical pain today, but did have some increased knee pain.  Patient continues to progress with strengthening and improved balance during session with less overall reliance on UE during balance tasks.  Patient continues to require skilled PT to progress towards goal related activities.  OBJECTIVE IMPAIRMENTS: decreased activity tolerance, decreased balance, difficulty walking, decreased strength, dizziness, impaired perceived functional ability, impaired UE functional use, and pain.   ACTIVITY LIMITATIONS: carrying, lifting, bending, squatting, stairs, and reach over head  PARTICIPATION LIMITATIONS: meal prep, cleaning, laundry, driving, shopping, and community activity  PERSONAL FACTORS: Time since onset of injury/illness/exacerbation and 1-2 comorbidities: chronic knee pain, diabetes, prior history of dizziness  are also affecting patient's functional outcome.   REHAB POTENTIAL: Good  CLINICAL DECISION MAKING:  Stable/uncomplicated  EVALUATION COMPLEXITY: Low  PLAN:  PT FREQUENCY: 2x/week  PT DURATION: 8 weeks  PLANNED INTERVENTIONS: 97164- PT Re-evaluation, 97110-Therapeutic exercises, 97530- Therapeutic activity, 97112- Neuromuscular re-education, 97535- Self Care, [id number]- Manual therapy, [phone]- Canalith repositioning, [id number]- Aquatic Therapy, [id number]- Electrical stimulation (unattended), [phone]- Electrical stimulation (manual), [id number]- Ultrasound, 65784- Ionotophoresis 4mg /ml Dexamethasone, Patient/Family education, Taping, Dry Needling, Joint mobilization, Vestibular training, Visual/preceptual remediation/compensation, Cryotherapy, and Moist heat  PLAN FOR NEXT SESSION: Vestibular/canalith reposition as indicated; balance ex's; UE/LE functional strengthening, ask about Audiologist appointment   Robyne Christen, PT, DPT 10/04/23, 11:05 AM  [facility] [address] [place], [place] [address] Phone # [phone] Fax [phone]

## 2023-10-05 DIAGNOSIS — H35371 Puckering of macula, right eye: Secondary | ICD-10-CM | POA: Diagnosis not present

## 2023-10-05 DIAGNOSIS — H25813 Combined forms of age-related cataract, bilateral: Secondary | ICD-10-CM | POA: Diagnosis not present

## 2023-10-05 DIAGNOSIS — H524 Presbyopia: Secondary | ICD-10-CM | POA: Diagnosis not present

## 2023-10-05 DIAGNOSIS — H18513 Endothelial corneal dystrophy, bilateral: Secondary | ICD-10-CM | POA: Diagnosis not present

## 2023-10-05 DIAGNOSIS — H35033 Hypertensive retinopathy, bilateral: Secondary | ICD-10-CM | POA: Diagnosis not present

## 2023-10-05 LAB — HM DIABETES EYE EXAM

## 2023-10-06 ENCOUNTER — Ambulatory Visit: Payer: PPO | Admitting: Family Medicine

## 2023-10-06 ENCOUNTER — Ambulatory Visit: Admitting: Rehabilitative and Restorative Service Providers"

## 2023-10-06 ENCOUNTER — Encounter: Payer: Self-pay | Admitting: Rehabilitative and Restorative Service Providers"

## 2023-10-06 DIAGNOSIS — M6281 Muscle weakness (generalized): Secondary | ICD-10-CM

## 2023-10-06 DIAGNOSIS — R42 Dizziness and giddiness: Secondary | ICD-10-CM

## 2023-10-06 DIAGNOSIS — M25511 Pain in right shoulder: Secondary | ICD-10-CM | POA: Diagnosis not present

## 2023-10-06 DIAGNOSIS — H8113 Benign paroxysmal vertigo, bilateral: Secondary | ICD-10-CM

## 2023-10-06 DIAGNOSIS — G8929 Other chronic pain: Secondary | ICD-10-CM

## 2023-10-06 DIAGNOSIS — R2689 Other abnormalities of gait and mobility: Secondary | ICD-10-CM

## 2023-10-06 NOTE — Therapy (Signed)
 OUTPATIENT PHYSICAL THERAPY TREATMENT NOTE   Patient Name: Jean Smith MRN: [id number] DOB:03-Sep-1950, 73 y.o., female Today's Date: 10/06/2023   PCP: Bambi Lever DN REFERRING PROVIDER: Ronnell Coins DO  END OF SESSION:  PT End of Session - 10/06/23 1025     Visit Number 9    Date for PT Re-Evaluation 10/26/23    Authorization Type [employer]    Progress Note Due on Visit 10    PT Start Time 1018    PT Stop Time 1056    PT Time Calculation (min) 38 min    Activity Tolerance Patient tolerated treatment well    Behavior During Therapy [facility] for tasks assessed/performed              Past Medical History:  Diagnosis Date   Allergy    Anxiety    Aortic regurgitation    moderate by echo 10/2020   Arthritis    Cancer (HCC)    skin   Cataract    bilateral - MD is just watching    Diverticulosis    Fuchs' endothelial dystrophy    follows with optho regularly    Gestational diabetes    Heart murmur    MVP    History of depression    HSV-2 infection    Hyperlipidemia    Hyperplastic colon polyp    Mitral valve prolapse    mild to moderate MR by echo 10/2020   PVC's (premature ventricular contractions)    intol of BB, sxc palpitations r/t stress   RVOT ventricular tachycardia/PVCs    EP eval 01/2013 for freq PVCs   Past Surgical History:  Procedure Laterality Date   CESAREAN SECTION     x 3   COLONOSCOPY     KNEE SURGERY Right    MANDIBLE SURGERY     right side in front of ear   MOHS SURGERY  2021   POLYPECTOMY     WISDOM TOOTH EXTRACTION     Patient Active Problem List   Diagnosis Date Noted   AC (acromioclavicular) arthritis 05/28/2022   Mitral valve prolapse    Aortic regurgitation    NSVT (nonsustained ventricular tachycardia) (HCC) 09/25/2020   Diabetes mellitus type 2 with complications (HCC) 08/27/2020   Chronic pain of both shoulders 05/05/2018   Other fatigue 01/02/2018   Allergic rhinitis 01/02/2018   Routine general  medical examination at a health care facility 09/01/2015   Hyperlipidemia associated with type 2 diabetes mellitus (HCC) 09/01/2015   Varicose vein 08/27/2014   Primary localized osteoarthrosis, lower leg 10/30/2013   RVOT ventricular tachycardia/PVCs 01/31/2013   Abnormal stress echo 12/11/2012    ONSET DATE: February   REFERRING DIAG: vertigo, balance disorder, chronic right knee pain; chronic pain of both shoulders  THERAPY DIAG:  Vertigo; dizziness; balance disorder; generalized weakness; right knee pain  Rationale for Evaluation and Treatment: Rehabilitation  SUBJECTIVE:  SUBJECTIVE STATEMENT: Patient reports that she has been having some bad allergies since she has been doing some yard work.  States that her neck is feeling some better, but states some increased knee pain from doing some yard work.  Patient had to reschedule audiologist appointment due to not feeling well.  PERTINENT HISTORY:  DM Chronic right knee pain with prior PT ending in Feb (cortisone injection in knee in Feb)  PAIN:   Are you having pain? Yes NPRS scale: 5/10 Pain location: right knee Pain orientation: Bilateral  PAIN TYPE: aching Pain description: intermittent  Aggravating factors: turning head to the right;  a little with looking up at the sky (careful in the shower); careful with bending over (getting stuff out of the fridge low shelf); arms overhead; turning head and looking up at the waitress at the restaurant; leaning over the sink; pushing the grocery cart and reaching for items; can't walk the steps (I have to crawl up them) Relieving factors: keeping head still   PRECAUTIONS: risk of falls    WEIGHT BEARING RESTRICTIONS: No  FALLS: Has patient fallen in last 6 months? Yes. Number of falls 1  LIVING  ENVIRONMENT: Lives with: lives with their spouse Lives in: House/apartment Stairs: Yes: Internal: 12 steps; on right going up and External: 5 steps; on right going up Has following equipment at home: None  PLOF: Independent  PATIENT GOALS: stop dizziness with head movements; improve balance, make my arms and legs stronger  OBJECTIVE:  Note: Objective measures were completed at Evaluation unless otherwise noted. 05/30/2022 DIAGNOSTIC FINDINGS: LEFT SHOULDER - 3 VIEW   COMPARISON:  None Available.   FINDINGS: There is no evidence of fracture or dislocation. Mild glenohumeral osteophytosis. Moderate acromioclavicular joint space loss and osteophytosis. No soft tissue abnormalities. The visualized left lung is clear.   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     COGNITION: Overall cognitive status: Within functional limits for tasks assessed  Cervical ROM: 25% limited with rotation and extension (slow movement) UPPER EXTREMITY ROM: WFLs UPPER EXTREMITY MMT: shoulders grossly 4/5  LOWER EXTREMITY ROM:    right knee 3-115, left knee 0-120 degrees LOWER EXTREMITY MMT:   Eval:  right knee extension 4-/5; left knee extension 4/5; left hip abduction 4/5;  right hip abduction 4-/5; SLS on left 9 sec, right 3 sec with pelvic drop indicating glute weakness on right   09/26/2023: Bilateral hip strength of grossly 4/5 Bilateral knee strength of grossly 4 to 4+/5   PATIENT SURVEYS:  Eval: Dizziness handicap Inventory 62% Activities specific Balance Confidence Scale: 45.62% (low confidence in balance)  09/26/2023: Total ABC score: 760 / 1600 = 47.5 % DHI Total Score: 56 / 100   FUNCTIONAL ASSESSMENT:  Eval: BERG balance test:    44  /56 5x STS 14.30 sec no UE assist needed   VESTIBULAR ASSESSMENT: 09/06/2023: Smooth Pursuit:  WFL Gaze Stabilization:  WFL Saccades:  WFL Head Thrust:  positive for nystagmus Dix Hallpike: Negative  bilat Supine Head Roll Test:  positive on the right, negative on the left Canalith Repositioning:  Barbecue Roll to treat the right  TODAY'S TREATMENT  DATE: 10/06/2023 Recumbant bike L3 x 6 min with PT present to monitor and discuss status Seated with 3# ankle weights:  LAQ, hip ER, and marching.  2x10 each bilat Ambulation to cancer PT gym and back with 3# ankle weights x2 with seated recovery period in between Tandem stance on AirEx beam in parallel bars with UE support, as needed, down and back x3 Side stepping on AirEx beam in parallel bars with UE support, as needed, down and back x3 Standing on foam pad in parallel bars:  performing ball toss x20 tosses Standing on foam pad in parallel bars: performing head nods and head rotations 2x30 sec each FWD step over 4 small hurdles in parallel bars down and back x3 Side step over 4 small hurdles in parallel bars down and back x2   DATE: 10/04/2023 (visit length shortened due to late arrival) Recumbant bike L3 x 5 min with PT present to monitor and discuss status Seated with 2.5# ankle weights:  LAQ, hip ER, and marching.  x15 each bilat Standing on foam pad in parallel bars:  performing ball toss x20 tosses Standing on foam pad in parallel bars: performing head nods and head rotations 2x30 sec each Tandem stance on AirEx beam in parallel bars with UE support, as needed, down and back x3 Side stepping on AirEx beam in parallel bars with UE support, as needed, down and back x3 Standing hamstring stretch at stairs 2x20 sec bilat   DATE: 09/28/2023 Recumbant bike L2 x 6 min with PT present to monitor and discuss status Seated SNAGs for cervical rotation x10 bilat Seated SNAGs for cervical extension x10 Seated with 2.5# ankle weights:  LAQ and marching.  2x10 each bilat Dix Hallpike negative bilateral Standing on foam  pad in parallel bars:  performing ball toss x20 tosses Standing on wobble board x30 sec with UE support as needed   PATIENT EDUCATION: Education details: Educated patient on anatomy and physiology of current symptoms, prognosis, plan of care as well as initial self care strategies to promote recovery Person educated: Patient Education method: Explanation Education comprehension: verbalized understanding  HOME EXERCISE PROGRAM: Access Code: [email or web address] URL: [email or web address] Date: 09/20/2023 Prepared by: Raynell Caller  Exercises - Brandt-Daroff Vestibular Exercise  - 1 x daily - 7 x weekly - 3-5 reps - Seated Gaze Stabilization with Head Rotation  - 1 x daily - 7 x weekly - 2 sets - 30 reps - Seated Gaze Stabilization with Head Nod  - 1 x daily - 7 x weekly - 2 sets - 30 reps - Standing Feet Together Smooth Pursuit  - 1 x daily - 7 x weekly - 2 sets - 30 sec hold - Standing Horizontal Saccades  - 1 x daily - 7 x weekly - 2 sets - 30 sec hold - Standing Vertical Saccades  - 1 x daily - 7 x weekly - 2 sets - 30 sec hold - Seated March with Resistance  - 1 x daily - 7 x weekly - 2 sets - 20 reps - Seated Hip Abduction with Resistance  - 1 x daily - 7 x weekly - 2 sets - 10 reps - Seated Hamstring Curls with Resistance  - 1 x daily - 7 x weekly - 2 sets - 10 reps - Seated Long Arc Quad with Hip Adduction  - 1 x daily - 7 x weekly - 2 sets - 10 reps  GOALS: Goals reviewed with patient? Yes  SHORT TERM GOALS: Target  date: 09/28/2023  The patient will demonstrate knowledge of basic self care strategies and exercises to promote healing  Baseline: Goal status: Ongoing  2.  Improvement in dizziness with head movements by 50% Baseline:  Goal status: Met on 09/26/23   3.  Patient will be able to lean over the sink and dishwasher with minimal dizziness Baseline:  Goal status: Ongoing (reports mod dizziness on 09/26/23)  4.  The patient will have an improved BERG balance  score to  47  /56 indicating reduced risk of falls  Baseline:  Goal status: Ongoing  5.  The patient will have improved knee and hip strength to at least 4/5 needed for standing, walking longer distances  Baseline:  Goal status: Met on 09/26/23   LONG TERM GOALS: Target date: 10/26/2023    The patient will be independent in a safe self progression of a home exercise program to promote further recovery of function  Baseline:  Goal status: Ongoing  2.  The patient will have improved LE strength of at least 4+/5 needed to ascend and descend steps reciprocally with UE support  Baseline:  Goal status: Ongoing  3.  The patient will have an improved BERG balance score to  49/56 indicating reduced risk of falls  Baseline:  Goal status: INITIAL  4.  ABC scale improved to 60% indicating improved confidence in balance Baseline:  Goal status: Ongoing (see above)  5.  Dizziness Handicap Inventory improved to 50% Baseline:  Goal status: Ongoing (see above)    ASSESSMENT:  CLINICAL IMPRESSION: Ms Pisani presents to skilled PT reporting that she no longer has dizziness, but does continue to have feelings of being unbalanced.  Patient able to progress throughout session and able to progress with functional balance tasks.  Patient continues to progress with dynamic balance tasks and requires SBA and minimal cuing for improved safety.  Patient continues to require skilled PT to progress towards goal related activities.  Will reassess balance next visit.  OBJECTIVE IMPAIRMENTS: decreased activity tolerance, decreased balance, difficulty walking, decreased strength, dizziness, impaired perceived functional ability, impaired UE functional use, and pain.   ACTIVITY LIMITATIONS: carrying, lifting, bending, squatting, stairs, and reach over head  PARTICIPATION LIMITATIONS: meal prep, cleaning, laundry, driving, shopping, and community activity  PERSONAL FACTORS: Time since onset of  injury/illness/exacerbation and 1-2 comorbidities: chronic knee pain, diabetes, prior history of dizziness  are also affecting patient's functional outcome.   REHAB POTENTIAL: Good  CLINICAL DECISION MAKING: Stable/uncomplicated  EVALUATION COMPLEXITY: Low  PLAN:  PT FREQUENCY: 2x/week  PT DURATION: 8 weeks  PLANNED INTERVENTIONS: 97164- PT Re-evaluation, 97110-Therapeutic exercises, 97530- Therapeutic activity, 97112- Neuromuscular re-education, 97535- Self Care, 62130- Manual therapy, [phone]- Canalith repositioning, [id number]- Aquatic Therapy, [id number]- Electrical stimulation (unattended), [phone]- Electrical stimulation (manual), [id number]- Ultrasound, 13244- Ionotophoresis 4mg /ml Dexamethasone, Patient/Family education, Taping, Dry Needling, Joint mobilization, Vestibular training, Visual/preceptual remediation/compensation, Cryotherapy, and Moist heat  PLAN FOR NEXT SESSION: Vestibular/canalith reposition as indicated; balance ex's; UE/LE functional strengthening   Robyne Christen, PT, DPT 10/06/23, 12:31 PM  [facility] [address] [place], [place] [address] Phone # [phone] Fax [phone]

## 2023-10-10 ENCOUNTER — Ambulatory Visit

## 2023-10-10 DIAGNOSIS — R2689 Other abnormalities of gait and mobility: Secondary | ICD-10-CM

## 2023-10-10 DIAGNOSIS — M25511 Pain in right shoulder: Secondary | ICD-10-CM

## 2023-10-10 DIAGNOSIS — M6281 Muscle weakness (generalized): Secondary | ICD-10-CM

## 2023-10-10 NOTE — Therapy (Signed)
 OUTPATIENT PHYSICAL THERAPY TREATMENT NOTE   Patient Name: Jean Smith MRN: [id number] DOB:11-03-1950, 73 y.o., female Today's Date: 10/10/2023   PCP: Bambi Lever DN REFERRING PROVIDER: Ronnell Coins DO Progress Note Reporting Period 08/31/23 to 10/10/23  See note below for Objective Data and Assessment of Progress/Goals.     END OF SESSION:  PT End of Session - 10/10/23 1228     Visit Number 10    Authorization Type [employer]    Progress Note Due on Visit 20    PT Start Time 1149    PT Stop Time 1238    PT Time Calculation (min) 49 min    Activity Tolerance Patient tolerated treatment well    Behavior During Therapy WFL for tasks assessed/performed               Past Medical History:  Diagnosis Date   Allergy    Anxiety    Aortic regurgitation    moderate by echo 10/2020   Arthritis    Cancer (HCC)    skin   Cataract    bilateral - MD is just watching    Diverticulosis    Fuchs' endothelial dystrophy    follows with optho regularly    Gestational diabetes    Heart murmur    MVP    History of depression    HSV-2 infection    Hyperlipidemia    Hyperplastic colon polyp    Mitral valve prolapse    mild to moderate MR by echo 10/2020   PVC's (premature ventricular contractions)    intol of BB, sxc palpitations r/t stress   RVOT ventricular tachycardia/PVCs    EP eval 01/2013 for freq PVCs   Past Surgical History:  Procedure Laterality Date   CESAREAN SECTION     x 3   COLONOSCOPY     KNEE SURGERY Right    MANDIBLE SURGERY     right side in front of ear   MOHS SURGERY  2021   POLYPECTOMY     WISDOM TOOTH EXTRACTION     Patient Active Problem List   Diagnosis Date Noted   AC (acromioclavicular) arthritis 05/28/2022   Mitral valve prolapse    Aortic regurgitation    NSVT (nonsustained ventricular tachycardia) (HCC) 09/25/2020   Diabetes mellitus type 2 with complications (HCC) 08/27/2020   Chronic pain of both  shoulders 05/05/2018   Other fatigue 01/02/2018   Allergic rhinitis 01/02/2018   Routine general medical examination at a health care facility 09/01/2015   Hyperlipidemia associated with type 2 diabetes mellitus (HCC) 09/01/2015   Varicose vein 08/27/2014   Primary localized osteoarthrosis, lower leg 10/30/2013   RVOT ventricular tachycardia/PVCs 01/31/2013   Abnormal stress echo 12/11/2012    ONSET DATE: February   REFERRING DIAG: vertigo, balance disorder, chronic right knee pain; chronic pain of both shoulders  THERAPY DIAG:  Vertigo; dizziness; balance disorder; generalized weakness; right knee pain  Rationale for Evaluation and Treatment: Rehabilitation  SUBJECTIVE:  SUBJECTIVE STATEMENT: My ankles were sore after last session.  It might have been the walking with ankle weights or because I'm doing heel raises when my blood glucose spikes.    PERTINENT HISTORY:  DM Chronic right knee pain with prior PT ending in Feb (cortisone injection in knee in Feb)  PAIN: 10/10/23  Are you having pain? Yes NPRS scale: 4/10 Pain location: right knee Pain orientation: Bilateral  PAIN TYPE: aching Pain description: intermittent  Aggravating factors: turning head to the right;  a little with looking up at the sky (careful in the shower); careful with bending over (getting stuff out of the fridge low shelf); arms overhead; turning head and looking up at the waitress at the restaurant; leaning over the sink; pushing the grocery cart and reaching for items; can't walk the steps (I have to crawl up them) Relieving factors: keeping head still   PRECAUTIONS: risk of falls    WEIGHT BEARING RESTRICTIONS: No  FALLS: Has patient fallen in last 6 months? Yes. Number of falls 1  LIVING ENVIRONMENT: Lives  with: lives with their spouse Lives in: House/apartment Stairs: Yes: Internal: 12 steps; on right going up and External: 5 steps; on right going up Has following equipment at home: None  PLOF: Independent  PATIENT GOALS: stop dizziness with head movements; improve balance, make my arms and legs stronger  OBJECTIVE:  Note: Objective measures were completed at Evaluation unless otherwise noted. 05/30/2022 DIAGNOSTIC FINDINGS: LEFT SHOULDER - 3 VIEW   COMPARISON:  None Available.   FINDINGS: There is no evidence of fracture or dislocation. Mild glenohumeral osteophytosis. Moderate acromioclavicular joint space loss and osteophytosis. No soft tissue abnormalities. The visualized left lung is clear.   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     COGNITION: Overall cognitive status: Within functional limits for tasks assessed  Cervical ROM: 25% limited with rotation and extension (slow movement) UPPER EXTREMITY ROM: WFLs UPPER EXTREMITY MMT: shoulders grossly 4/5  LOWER EXTREMITY ROM:    right knee 3-115, left knee 0-120 degrees LOWER EXTREMITY MMT:   Eval:  right knee extension 4-/5; left knee extension 4/5; left hip abduction 4/5;  right hip abduction 4-/5; SLS on left 9 sec, right 3 sec with pelvic drop indicating glute weakness on right   09/26/2023: Bilateral hip strength of grossly 4/5 Bilateral knee strength of grossly 4 to 4+/5   PATIENT SURVEYS:  Eval: Dizziness handicap Inventory 62% Activities specific Balance Confidence Scale: 45.62% (low confidence in balance)  09/26/2023: Total ABC score: 760 / 1600 = 47.5 % DHI Total Score: 56 / 100  10/10/23:  DHI total score: 34/100  FUNCTIONAL ASSESSMENT:  Eval: BERG balance test:    44  /56 5x STS 14.30 sec no UE assist needed   VESTIBULAR ASSESSMENT: 09/06/2023: Smooth Pursuit:  WFL Gaze Stabilization:  WFL Saccades:  WFL Head Thrust:  positive for nystagmus Dix  Hallpike: Negative bilat Supine Head Roll Test:  positive on the right, negative on the left Canalith Repositioning:  Barbecue Roll to treat the right  TODAY'S TREATMENT  DATE: 10/10/2023 Recumbant bike L3 x 6 min with PT present to monitor and discuss status Seated with 3# ankle weights:  LAQ, hip ER, and marching.  2x10 each bilat Alternating step taps on 6" step with min to no hand held assist by PT Ambulation to cancer PT gym and back with Farmer's carry 5#  Tandem stance on AirEx beam in parallel bars with UE support, as needed, down and back x3 Side stepping on AirEx beam in parallel bars with UE support, as needed, down and back x3 Standing on foam pad in parallel bars: performing head nods and head rotations 2x30 sec each FWD step over 4 small hurdles in parallel bars down and back x3-no UE support Side step over 4 small hurdles in parallel bars down and back x3- no UE support   DATE: 10/06/2023 Recumbant bike L3 x 6 min with PT present to monitor and discuss status Seated with 3# ankle weights:  LAQ, hip ER, and marching.  2x10 each bilat Ambulation to cancer PT gym and back with 3# ankle weights x2 with seated recovery period in between Tandem stance on AirEx beam in parallel bars with UE support, as needed, down and back x3 Side stepping on AirEx beam in parallel bars with UE support, as needed, down and back x3 Standing on foam pad in parallel bars:  performing ball toss x20 tosses Standing on foam pad in parallel bars: performing head nods and head rotations 2x30 sec each FWD step over 4 small hurdles in parallel bars down and back x3 Side step over 4 small hurdles in parallel bars down and back x2   DATE: 10/04/2023 (visit length shortened due to late arrival) Recumbant bike L3 x 5 min with PT present to monitor and discuss status Seated with 2.5# ankle  weights:  LAQ, hip ER, and marching.  x15 each bilat Standing on foam pad in parallel bars:  performing ball toss x20 tosses Standing on foam pad in parallel bars: performing head nods and head rotations 2x30 sec each Tandem stance on AirEx beam in parallel bars with UE support, as needed, down and back x3 Side stepping on AirEx beam in parallel bars with UE support, as needed, down and back x3 Standing hamstring stretch at stairs 2x20 sec bilat   PATIENT EDUCATION: Education details: Educated patient on anatomy and physiology of current symptoms, prognosis, plan of care as well as initial self care strategies to promote recovery Person educated: Patient Education method: Explanation Education comprehension: verbalized understanding  HOME EXERCISE PROGRAM: Access Code: [email or web address] URL: [email or web address] Date: 09/20/2023 Prepared by: Raynell Caller  Exercises - Brandt-Daroff Vestibular Exercise  - 1 x daily - 7 x weekly - 3-5 reps - Seated Gaze Stabilization with Head Rotation  - 1 x daily - 7 x weekly - 2 sets - 30 reps - Seated Gaze Stabilization with Head Nod  - 1 x daily - 7 x weekly - 2 sets - 30 reps - Standing Feet Together Smooth Pursuit  - 1 x daily - 7 x weekly - 2 sets - 30 sec hold - Standing Horizontal Saccades  - 1 x daily - 7 x weekly - 2 sets - 30 sec hold - Standing Vertical Saccades  - 1 x daily - 7 x weekly - 2 sets - 30 sec hold - Seated March with Resistance  - 1 x daily - 7 x weekly - 2 sets - 20 reps - Seated Hip Abduction with Resistance  -  1 x daily - 7 x weekly - 2 sets - 10 reps - Seated Hamstring Curls with Resistance  - 1 x daily - 7 x weekly - 2 sets - 10 reps - Seated Long Arc Quad with Hip Adduction  - 1 x daily - 7 x weekly - 2 sets - 10 reps  GOALS: Goals reviewed with patient? Yes  SHORT TERM GOALS: Target date: 09/28/2023  The patient will demonstrate knowledge of basic self care strategies and exercises to promote healing   Baseline: Goal status: Ongoing  2.  Improvement in dizziness with head movements by 50% Baseline:  Goal status: Met on 09/26/23   3.  Patient will be able to lean over the sink and dishwasher with minimal dizziness Baseline:  Goal status: Ongoing (reports mod dizziness on 09/26/23)  4.  The patient will have an improved BERG balance score to  47  /56 indicating reduced risk of falls  Baseline:  Goal status: Ongoing  5.  The patient will have improved knee and hip strength to at least 4/5 needed for standing, walking longer distances  Baseline:  Goal status: Met on 09/26/23   LONG TERM GOALS: Target date: 10/26/2023    The patient will be independent in a safe self progression of a home exercise program to promote further recovery of function  Baseline:  Goal status: Ongoing  2.  The patient will have improved LE strength of at least 4+/5 needed to ascend and descend steps reciprocally with UE support  Baseline:  Goal status: Ongoing  3.  The patient will have an improved BERG balance score to  49/56 indicating reduced risk of falls  Baseline:  Goal status: INITIAL  4.  ABC scale improved to 60% indicating improved confidence in balance Baseline:  Goal status: Ongoing (see above)  5.  Dizziness Handicap Inventory improved to 50% Baseline: 34/100 (10/10/23) Goal status: MET    ASSESSMENT:  CLINICAL IMPRESSION: Pt no longer feeling dizzy with only one minor episode over the past 2 weeks.  She reports 75% improved balance confidence.  Pt is challenged by balance tasks  but requires less guarding and only close supervision.  DHI is improved to 34%, meeting goal.  Patient continues to require skilled PT to progress towards goal related activities and to improve safety at home and in the community.   OBJECTIVE IMPAIRMENTS: decreased activity tolerance, decreased balance, difficulty walking, decreased strength, dizziness, impaired perceived functional ability, impaired UE  functional use, and pain.   ACTIVITY LIMITATIONS: carrying, lifting, bending, squatting, stairs, and reach over head  PARTICIPATION LIMITATIONS: meal prep, cleaning, laundry, driving, shopping, and community activity  PERSONAL FACTORS: Time since onset of injury/illness/exacerbation and 1-2 comorbidities: chronic knee pain, diabetes, prior history of dizziness  are also affecting patient's functional outcome.   REHAB POTENTIAL: Good  CLINICAL DECISION MAKING: Stable/uncomplicated  EVALUATION COMPLEXITY: Low  PLAN:  PT FREQUENCY: 2x/week  PT DURATION: 8 weeks  PLANNED INTERVENTIONS: 97164- PT Re-evaluation, 97110-Therapeutic exercises, 97530- Therapeutic activity, 97112- Neuromuscular re-education, 97535- Self Care, 11914- Manual therapy, [phone]- Canalith repositioning, [id number]- Aquatic Therapy, [id number]- Electrical stimulation (unattended), [phone]- Electrical stimulation (manual), [id number]- Ultrasound, [id number]- Ionotophoresis 4mg /ml Dexamethasone, Patient/Family education, Taping, Dry Needling, Joint mobilization, Vestibular training, Visual/preceptual remediation/compensation, Cryotherapy, and Moist heat  PLAN FOR NEXT SESSION: Vestibular/canalith reposition as indicated; balance ex's; UE/LE functional strengthening   Luella Sager, PT 10/10/23 12:44 PM   [facility] [address] [place], [place] [address] Phone # [phone] Fax [phone]

## 2023-10-14 ENCOUNTER — Encounter: Payer: Self-pay | Admitting: Rehabilitative and Restorative Service Providers"

## 2023-10-14 ENCOUNTER — Ambulatory Visit: Attending: Family Medicine | Admitting: Rehabilitative and Restorative Service Providers"

## 2023-10-14 DIAGNOSIS — R42 Dizziness and giddiness: Secondary | ICD-10-CM | POA: Diagnosis not present

## 2023-10-14 DIAGNOSIS — R2689 Other abnormalities of gait and mobility: Secondary | ICD-10-CM | POA: Insufficient documentation

## 2023-10-14 DIAGNOSIS — M25561 Pain in right knee: Secondary | ICD-10-CM | POA: Insufficient documentation

## 2023-10-14 DIAGNOSIS — M6281 Muscle weakness (generalized): Secondary | ICD-10-CM | POA: Insufficient documentation

## 2023-10-14 DIAGNOSIS — H8113 Benign paroxysmal vertigo, bilateral: Secondary | ICD-10-CM | POA: Diagnosis not present

## 2023-10-14 DIAGNOSIS — G8929 Other chronic pain: Secondary | ICD-10-CM | POA: Insufficient documentation

## 2023-10-14 DIAGNOSIS — M25512 Pain in left shoulder: Secondary | ICD-10-CM | POA: Insufficient documentation

## 2023-10-14 DIAGNOSIS — M25511 Pain in right shoulder: Secondary | ICD-10-CM | POA: Diagnosis not present

## 2023-10-14 NOTE — Therapy (Signed)
 OUTPATIENT PHYSICAL THERAPY TREATMENT NOTE   Patient Name: Jean Smith MRN: [id number] DOB:1950-10-02, 73 y.o., female Today's Date: 10/14/2023   PCP: Bambi Lever DN REFERRING PROVIDER: Ronnell Coins DO Progress Note Reporting Period 08/31/23 to 10/10/23  See note below for Objective Data and Assessment of Progress/Goals.     END OF SESSION:  PT End of Session - 10/14/23 1023     Visit Number 11    Date for PT Re-Evaluation 10/26/23    Authorization Type [employer]    Progress Note Due on Visit 20    PT Start Time 1020    PT Stop Time 1115    PT Time Calculation (min) 55 min    Activity Tolerance Patient tolerated treatment well    Behavior During Therapy WFL for tasks assessed/performed               Past Medical History:  Diagnosis Date   Allergy    Anxiety    Aortic regurgitation    moderate by echo 10/2020   Arthritis    Cancer (HCC)    skin   Cataract    bilateral - MD is just watching    Diverticulosis    Fuchs' endothelial dystrophy    follows with optho regularly    Gestational diabetes    Heart murmur    MVP    History of depression    HSV-2 infection    Hyperlipidemia    Hyperplastic colon polyp    Mitral valve prolapse    mild to moderate MR by echo 10/2020   PVC's (premature ventricular contractions)    intol of BB, sxc palpitations r/t stress   RVOT ventricular tachycardia/PVCs    EP eval 01/2013 for freq PVCs   Past Surgical History:  Procedure Laterality Date   CESAREAN SECTION     x 3   COLONOSCOPY     KNEE SURGERY Right    MANDIBLE SURGERY     right side in front of ear   MOHS SURGERY  2021   POLYPECTOMY     WISDOM TOOTH EXTRACTION     Patient Active Problem List   Diagnosis Date Noted   AC (acromioclavicular) arthritis 05/28/2022   Mitral valve prolapse    Aortic regurgitation    NSVT (nonsustained ventricular tachycardia) (HCC) 09/25/2020   Diabetes mellitus type 2 with complications (HCC)  08/27/2020   Chronic pain of both shoulders 05/05/2018   Other fatigue 01/02/2018   Allergic rhinitis 01/02/2018   Routine general medical examination at a health care facility 09/01/2015   Hyperlipidemia associated with type 2 diabetes mellitus (HCC) 09/01/2015   Varicose vein 08/27/2014   Primary localized osteoarthrosis, lower leg 10/30/2013   RVOT ventricular tachycardia/PVCs 01/31/2013   Abnormal stress echo 12/11/2012    ONSET DATE: February   REFERRING DIAG: vertigo, balance disorder, chronic right knee pain; chronic pain of both shoulders  THERAPY DIAG:  Vertigo; dizziness; balance disorder; generalized weakness; right knee pain  Rationale for Evaluation and Treatment: Rehabilitation  SUBJECTIVE:  SUBJECTIVE STATEMENT: Patient states that her neck is feeling better, but she is having increased knee pain.  PERTINENT HISTORY:  DM Chronic right knee pain with prior PT ending in Feb (cortisone injection in knee in Feb)  PAIN: 10/10/23  Are you having pain? Yes NPRS scale: 4-5/10 Pain location: right knee Pain orientation: Bilateral  PAIN TYPE: aching Pain description: intermittent  Aggravating factors: turning head to the right;  a little with looking up at the sky (careful in the shower); careful with bending over (getting stuff out of the fridge low shelf); arms overhead; turning head and looking up at the waitress at the restaurant; leaning over the sink; pushing the grocery cart and reaching for items; can't walk the steps (I have to crawl up them) Relieving factors: keeping head still   PRECAUTIONS: risk of falls    WEIGHT BEARING RESTRICTIONS: No  FALLS: Has patient fallen in last 6 months? Yes. Number of falls 1  LIVING ENVIRONMENT: Lives with: lives with their spouse Lives  in: House/apartment Stairs: Yes: Internal: 12 steps; on right going up and External: 5 steps; on right going up Has following equipment at home: None  PLOF: Independent  PATIENT GOALS: stop dizziness with head movements; improve balance, make my arms and legs stronger  OBJECTIVE:  Note: Objective measures were completed at Evaluation unless otherwise noted. 05/30/2022 DIAGNOSTIC FINDINGS: LEFT SHOULDER - 3 VIEW   COMPARISON:  None Available.   FINDINGS: There is no evidence of fracture or dislocation. Mild glenohumeral osteophytosis. Moderate acromioclavicular joint space loss and osteophytosis. No soft tissue abnormalities. The visualized left lung is clear.   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     COGNITION: Overall cognitive status: Within functional limits for tasks assessed  Cervical ROM: 25% limited with rotation and extension (slow movement) UPPER EXTREMITY ROM: WFLs UPPER EXTREMITY MMT: shoulders grossly 4/5  LOWER EXTREMITY ROM:    right knee 3-115, left knee 0-120 degrees LOWER EXTREMITY MMT:   Eval:  right knee extension 4-/5; left knee extension 4/5; left hip abduction 4/5;  right hip abduction 4-/5; SLS on left 9 sec, right 3 sec with pelvic drop indicating glute weakness on right   09/26/2023: Bilateral hip strength of grossly 4/5 Bilateral knee strength of grossly 4 to 4+/5   PATIENT SURVEYS:  Eval: Dizziness handicap Inventory 62% Activities specific Balance Confidence Scale: 45.62% (low confidence in balance)  09/26/2023: Total ABC score: 760 / 1600 = 47.5 % DHI Total Score: 56 / 100  10/10/23:  DHI total score: 34/100  FUNCTIONAL ASSESSMENT:  Eval: BERG balance test:    44  /56 5x STS 14.30 sec no UE assist needed  10/14/2023: BERG: 53/56 Single leg stance:  Left- 10 sec, Right- 9 sec   VESTIBULAR ASSESSMENT: 09/06/2023: Smooth Pursuit:  WFL Gaze Stabilization:  WFL Saccades:  WFL Head  Thrust:  positive for nystagmus Dix Hallpike: Negative bilat Supine Head Roll Test:  positive on the right, negative on the left Canalith Repositioning:  Barbecue Roll to treat the right  TODAY'S TREATMENT  DATE: 10/14/2023 Nustep level 5 x6 min with PT prsent to discuss status BERG 53/56 Alternating step taps on 6" step with min to no hand held assist by PT Tandem walking on AirEx beam in parallel bars with UE support, as needed, down and back x3 Side stepping on AirEx beam in parallel bars with UE support, as needed, down and back x3 FWD step ups on bosu x10 bilat Side step ups and over and back x5  FWD partial lunge onto bosu x10 bilat Standing on foam pad in parallel bars: performing head nods and head rotations 2x1 min each Standing on foam pad with eyes closed 2x30 sec with close SBA/CGA Step down from 2" step 2x10 bilat   DATE: 10/10/2023 Recumbant bike L3 x 6 min with PT present to monitor and discuss status Seated with 3# ankle weights:  LAQ, hip ER, and marching.  2x10 each bilat Alternating step taps on 6" step with min to no hand held assist by PT Ambulation to cancer PT gym and back with Farmer's carry 5#  Tandem walking on AirEx beam in parallel bars with UE support, as needed, down and back x3 Side stepping on AirEx beam in parallel bars with UE support, as needed, down and back x3 Standing on foam pad in parallel bars: performing head nods and head rotations 2x30 sec each FWD step over 4 small hurdles in parallel bars down and back x3-no UE support Side step over 4 small hurdles in parallel bars down and back x3- no UE support   DATE: 10/06/2023 Recumbant bike L3 x 6 min with PT present to monitor and discuss status Seated with 3# ankle weights:  LAQ, hip ER, and marching.  2x10 each bilat Ambulation to cancer PT gym and back with 3# ankle weights  x2 with seated recovery period in between Tandem stance on AirEx beam in parallel bars with UE support, as needed, down and back x3 Side stepping on AirEx beam in parallel bars with UE support, as needed, down and back x3 Standing on foam pad in parallel bars:  performing ball toss x20 tosses Standing on foam pad in parallel bars: performing head nods and head rotations 2x30 sec each FWD step over 4 small hurdles in parallel bars down and back x3 Side step over 4 small hurdles in parallel bars down and back x2    PATIENT EDUCATION: Education details: Educated patient on anatomy and physiology of current symptoms, prognosis, plan of care as well as initial self care strategies to promote recovery Person educated: Patient Education method: Explanation Education comprehension: verbalized understanding  HOME EXERCISE PROGRAM: Access Code: [email or web address] URL: [email or web address] Date: 09/20/2023 Prepared by: Raynell Caller  Exercises - Brandt-Daroff Vestibular Exercise  - 1 x daily - 7 x weekly - 3-5 reps - Seated Gaze Stabilization with Head Rotation  - 1 x daily - 7 x weekly - 2 sets - 30 reps - Seated Gaze Stabilization with Head Nod  - 1 x daily - 7 x weekly - 2 sets - 30 reps - Standing Feet Together Smooth Pursuit  - 1 x daily - 7 x weekly - 2 sets - 30 sec hold - Standing Horizontal Saccades  - 1 x daily - 7 x weekly - 2 sets - 30 sec hold - Standing Vertical Saccades  - 1 x daily - 7 x weekly - 2 sets - 30 sec hold - Seated March with Resistance  - 1 x daily - 7 x weekly -  2 sets - 20 reps - Seated Hip Abduction with Resistance  - 1 x daily - 7 x weekly - 2 sets - 10 reps - Seated Hamstring Curls with Resistance  - 1 x daily - 7 x weekly - 2 sets - 10 reps - Seated Long Arc Quad with Hip Adduction  - 1 x daily - 7 x weekly - 2 sets - 10 reps  GOALS: Goals reviewed with patient? Yes  SHORT TERM GOALS: Target date: 09/28/2023  The patient will demonstrate knowledge  of basic self care strategies and exercises to promote healing  Baseline: Goal status: Met on 10/14/23  2.  Improvement in dizziness with head movements by 50% Baseline:  Goal status: Met on 09/26/23   3.  Patient will be able to lean over the sink and dishwasher with minimal dizziness Baseline:  Goal status: Met on 10/14/23  4.  The patient will have an improved BERG balance score to  47  /56 indicating reduced risk of falls  Baseline:  Goal status: Met on 10/14/23  5.  The patient will have improved knee and hip strength to at least 4/5 needed for standing, walking longer distances  Baseline:  Goal status: Met on 09/26/23   LONG TERM GOALS: Target date: 10/26/2023    The patient will be independent in a safe self progression of a home exercise program to promote further recovery of function  Baseline:  Goal status: Ongoing  2.  The patient will have improved LE strength of at least 4+/5 needed to ascend and descend steps reciprocally with UE support  Baseline:  Goal status: Ongoing  3.  The patient will have an improved BERG balance score to  49/56 indicating reduced risk of falls  Baseline:  Goal status: Met on 10/14/23  4.  ABC scale improved to 60% indicating improved confidence in balance Baseline:  Goal status: Ongoing (see above)  5.  Dizziness Handicap Inventory improved to 50% Baseline: 34/100 (10/10/23) Goal status: MET on 10/10/23    ASSESSMENT:  CLINICAL IMPRESSION: Ms Bunn presents to skilled PT stating that her dizziness has improved a lot since initial evaluation.  Patient with largest complaint of knee pain at this time.  Patient continues to progress with balance and strengthening tasks during visit.  Patient with most difficulty with side steps up and over bosu with her right knee.  Patient with greatly improved balance and able to maintain static stance wit eyes closed on a compliance surface for 30 seconds without difficulty.  Patient states that she has  a follow up with her MD in a few weeks to address her continued knee pain.  OBJECTIVE IMPAIRMENTS: decreased activity tolerance, decreased balance, difficulty walking, decreased strength, dizziness, impaired perceived functional ability, impaired UE functional use, and pain.   ACTIVITY LIMITATIONS: carrying, lifting, bending, squatting, stairs, and reach over head  PARTICIPATION LIMITATIONS: meal prep, cleaning, laundry, driving, shopping, and community activity  PERSONAL FACTORS: Time since onset of injury/illness/exacerbation and 1-2 comorbidities: chronic knee pain, diabetes, prior history of dizziness  are also affecting patient's functional outcome.   REHAB POTENTIAL: Good  CLINICAL DECISION MAKING: Stable/uncomplicated  EVALUATION COMPLEXITY: Low  PLAN:  PT FREQUENCY: 2x/week  PT DURATION: 8 weeks  PLANNED INTERVENTIONS: 97164- PT Re-evaluation, 97110-Therapeutic exercises, 97530- Therapeutic activity, [id number]- Neuromuscular re-education, 97535- Self Care, 16109- Manual therapy, [id number]- Canalith repositioning, [id number]- Aquatic Therapy, [id number]- Electrical stimulation (unattended), [id number]- Electrical stimulation (manual), [id number]- Ultrasound, [id number]- Ionotophoresis 4mg /ml Dexamethasone, Patient/Family education, Taping, Dry Needling, Joint mobilization,  Vestibular training, Visual/preceptual remediation/compensation, Cryotherapy, and Moist heat  PLAN FOR NEXT SESSION: Vestibular/canalith reposition as indicated; balance ex's; UE/LE functional strengthening    Robyne Christen, PT, DPT 10/14/23, 11:27 AM  [facility] [address], [address] [place], [place] [address] Phone # [phone] Fax [phone]

## 2023-10-17 ENCOUNTER — Encounter: Admitting: Rehabilitative and Restorative Service Providers"

## 2023-10-18 ENCOUNTER — Encounter: Payer: Self-pay | Admitting: Rehabilitative and Restorative Service Providers"

## 2023-10-18 ENCOUNTER — Ambulatory Visit: Admitting: Rehabilitative and Restorative Service Providers"

## 2023-10-18 DIAGNOSIS — M6281 Muscle weakness (generalized): Secondary | ICD-10-CM

## 2023-10-18 DIAGNOSIS — R42 Dizziness and giddiness: Secondary | ICD-10-CM

## 2023-10-18 DIAGNOSIS — M25511 Pain in right shoulder: Secondary | ICD-10-CM | POA: Diagnosis not present

## 2023-10-18 DIAGNOSIS — H8113 Benign paroxysmal vertigo, bilateral: Secondary | ICD-10-CM

## 2023-10-18 DIAGNOSIS — G8929 Other chronic pain: Secondary | ICD-10-CM

## 2023-10-18 DIAGNOSIS — R2689 Other abnormalities of gait and mobility: Secondary | ICD-10-CM

## 2023-10-18 NOTE — Therapy (Signed)
 OUTPATIENT PHYSICAL THERAPY TREATMENT NOTE   Patient Name: Jean Smith MRN: [id number] DOB:19-May-1951, 73 y.o., female Today's Date: 10/18/2023   PCP: Bambi Lever DN REFERRING PROVIDER: Ronnell Coins DO Progress Note Reporting Period 08/31/23 to 10/10/23  See note below for Objective Data and Assessment of Progress/Goals.     END OF SESSION:  PT End of Session - 10/18/23 1030     Visit Number 12    Date for PT Re-Evaluation 10/26/23    Authorization Type [employer]    Progress Note Due on Visit 20    PT Start Time 1022    PT Stop Time 1100    PT Time Calculation (min) 38 min    Activity Tolerance Patient tolerated treatment well    Behavior During Therapy WFL for tasks assessed/performed               Past Medical History:  Diagnosis Date   Allergy    Anxiety    Aortic regurgitation    moderate by echo 10/2020   Arthritis    Cancer (HCC)    skin   Cataract    bilateral - MD is just watching    Diverticulosis    Fuchs' endothelial dystrophy    follows with optho regularly    Gestational diabetes    Heart murmur    MVP    History of depression    HSV-2 infection    Hyperlipidemia    Hyperplastic colon polyp    Mitral valve prolapse    mild to moderate MR by echo 10/2020   PVC's (premature ventricular contractions)    intol of BB, sxc palpitations r/t stress   RVOT ventricular tachycardia/PVCs    EP eval 01/2013 for freq PVCs   Past Surgical History:  Procedure Laterality Date   CESAREAN SECTION     x 3   COLONOSCOPY     KNEE SURGERY Right    MANDIBLE SURGERY     right side in front of ear   MOHS SURGERY  2021   POLYPECTOMY     WISDOM TOOTH EXTRACTION     Patient Active Problem List   Diagnosis Date Noted   AC (acromioclavicular) arthritis 05/28/2022   Mitral valve prolapse    Aortic regurgitation    NSVT (nonsustained ventricular tachycardia) (HCC) 09/25/2020   Diabetes mellitus type 2 with complications (HCC)  08/27/2020   Chronic pain of both shoulders 05/05/2018   Other fatigue 01/02/2018   Allergic rhinitis 01/02/2018   Routine general medical examination at a health care facility 09/01/2015   Hyperlipidemia associated with type 2 diabetes mellitus (HCC) 09/01/2015   Varicose vein 08/27/2014   Primary localized osteoarthrosis, lower leg 10/30/2013   RVOT ventricular tachycardia/PVCs 01/31/2013   Abnormal stress echo 12/11/2012    ONSET DATE: February   REFERRING DIAG: vertigo, balance disorder, chronic right knee pain; chronic pain of both shoulders  THERAPY DIAG:  Vertigo; dizziness; balance disorder; generalized weakness; right knee pain  Rationale for Evaluation and Treatment: Rehabilitation  SUBJECTIVE:  SUBJECTIVE STATEMENT: Patient states that her neck is feeling better, but she is having increased knee pain.  PERTINENT HISTORY:  DM Chronic right knee pain with prior PT ending in Feb (cortisone injection in knee in Feb)  PAIN:  Are you having pain? Yes NPRS scale: 5/10 Pain location: right knee Pain orientation: Bilateral  PAIN TYPE: aching Pain description: intermittent  Aggravating factors: turning head to the right;  a little with looking up at the sky (careful in the shower); careful with bending over (getting stuff out of the fridge low shelf); arms overhead; turning head and looking up at the waitress at the restaurant; leaning over the sink; pushing the grocery cart and reaching for items; can't walk the steps (I have to crawl up them) Relieving factors: keeping head still   PRECAUTIONS: risk of falls    WEIGHT BEARING RESTRICTIONS: No  FALLS: Has patient fallen in last 6 months? Yes. Number of falls 1  LIVING ENVIRONMENT: Lives with: lives with their spouse Lives in:  House/apartment Stairs: Yes: Internal: 12 steps; on right going up and External: 5 steps; on right going up Has following equipment at home: None  PLOF: Independent  PATIENT GOALS: stop dizziness with head movements; improve balance, make my arms and legs stronger  OBJECTIVE:  Note: Objective measures were completed at Evaluation unless otherwise noted. 05/30/2022 DIAGNOSTIC FINDINGS: LEFT SHOULDER - 3 VIEW   COMPARISON:  None Available.   FINDINGS: There is no evidence of fracture or dislocation. Mild glenohumeral osteophytosis. Moderate acromioclavicular joint space loss and osteophytosis. No soft tissue abnormalities. The visualized left lung is clear.   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     COGNITION: Overall cognitive status: Within functional limits for tasks assessed  Cervical ROM: 25% limited with rotation and extension (slow movement) UPPER EXTREMITY ROM: WFLs UPPER EXTREMITY MMT: shoulders grossly 4/5  LOWER EXTREMITY ROM:    right knee 3-115, left knee 0-120 degrees LOWER EXTREMITY MMT:   Eval:  right knee extension 4-/5; left knee extension 4/5; left hip abduction 4/5;  right hip abduction 4-/5; SLS on left 9 sec, right 3 sec with pelvic drop indicating glute weakness on right   09/26/2023: Bilateral hip strength of grossly 4/5 Bilateral knee strength of grossly 4 to 4+/5   PATIENT SURVEYS:  Eval: Dizziness handicap Inventory 62% Activities specific Balance Confidence Scale: 45.62% (low confidence in balance)  09/26/2023: Total ABC score: 760 / 1600 = 47.5 % DHI Total Score: 56 / 100  10/10/23:  DHI total score: 34/100  FUNCTIONAL ASSESSMENT:  Eval: BERG balance test:    44  /56 5x STS 14.30 sec no UE assist needed  10/14/2023: BERG: 53/56 Single leg stance:  Left- 10 sec, Right- 9 sec   VESTIBULAR ASSESSMENT: 09/06/2023: Smooth Pursuit:  WFL Gaze Stabilization:  WFL Saccades:  WFL Head Thrust:   positive for nystagmus Dix Hallpike: Negative bilat Supine Head Roll Test:  positive on the right, negative on the left Canalith Repositioning:  Barbecue Roll to treat the right  TODAY'S TREATMENT  DATE: 10/18/2023 Recumbant bike L3 x 6 min with PT present to monitor and discuss status Dix Hallpike negative bilat Tandem walking on AirEx beam in parallel bars with UE support, as needed, down and back x3 Side stepping on AirEx beam in parallel bars with UE support, as needed, down and back x3 Ambulation outside on sidewalk with horizontal and vertical head turns 2x20 ft each FWD step ups on curb without UE support x10 bilat Marching on trampoline 2x1 min with unilateral UE support Mini bouncing on trampoline performing head turns and head nods x30 sec each Seated hamstring stretch 2x20 sec bilat   DATE: 10/14/2023 Nustep level 5 x6 min with PT prsent to discuss status BERG 53/56 Alternating step taps on 6" step with min to no hand held assist by PT Tandem walking on AirEx beam in parallel bars with UE support, as needed, down and back x3 Side stepping on AirEx beam in parallel bars with UE support, as needed, down and back x3 FWD step ups on bosu x10 bilat Side step ups and over and back x5  FWD partial lunge onto bosu x10 bilat Standing on foam pad in parallel bars: performing head nods and head rotations 2x1 min each Standing on foam pad with eyes closed 2x30 sec with close SBA/CGA Step down from 2" step 2x10 bilat   DATE: 10/10/2023 Recumbant bike L3 x 6 min with PT present to monitor and discuss status Seated with 3# ankle weights:  LAQ, hip ER, and marching.  2x10 each bilat Alternating step taps on 6" step with min to no hand held assist by PT Ambulation to cancer PT gym and back with Farmer's carry 5#  Tandem walking on AirEx beam in parallel bars with UE  support, as needed, down and back x3 Side stepping on AirEx beam in parallel bars with UE support, as needed, down and back x3 Standing on foam pad in parallel bars: performing head nods and head rotations 2x30 sec each FWD step over 4 small hurdles in parallel bars down and back x3-no UE support Side step over 4 small hurdles in parallel bars down and back x3- no UE support    PATIENT EDUCATION: Education details: Educated patient on anatomy and physiology of current symptoms, prognosis, plan of care as well as initial self care strategies to promote recovery Person educated: Patient Education method: Explanation Education comprehension: verbalized understanding  HOME EXERCISE PROGRAM: Access Code: [email or web address] URL: [email or web address] Date: 09/20/2023 Prepared by: Raynell Caller  Exercises - Brandt-Daroff Vestibular Exercise  - 1 x daily - 7 x weekly - 3-5 reps - Seated Gaze Stabilization with Head Rotation  - 1 x daily - 7 x weekly - 2 sets - 30 reps - Seated Gaze Stabilization with Head Nod  - 1 x daily - 7 x weekly - 2 sets - 30 reps - Standing Feet Together Smooth Pursuit  - 1 x daily - 7 x weekly - 2 sets - 30 sec hold - Standing Horizontal Saccades  - 1 x daily - 7 x weekly - 2 sets - 30 sec hold - Standing Vertical Saccades  - 1 x daily - 7 x weekly - 2 sets - 30 sec hold - Seated March with Resistance  - 1 x daily - 7 x weekly - 2 sets - 20 reps - Seated Hip Abduction with Resistance  - 1 x daily - 7 x weekly - 2 sets - 10 reps - Seated Hamstring Curls with Resistance  -  1 x daily - 7 x weekly - 2 sets - 10 reps - Seated Long Arc Quad with Hip Adduction  - 1 x daily - 7 x weekly - 2 sets - 10 reps  GOALS: Goals reviewed with patient? Yes  SHORT TERM GOALS: Target date: 09/28/2023  The patient will demonstrate knowledge of basic self care strategies and exercises to promote healing  Baseline: Goal status: Met on 10/14/23  2.  Improvement in dizziness with  head movements by 50% Baseline:  Goal status: Met on 09/26/23   3.  Patient will be able to lean over the sink and dishwasher with minimal dizziness Baseline:  Goal status: Met on 10/14/23  4.  The patient will have an improved BERG balance score to  47  /56 indicating reduced risk of falls  Baseline:  Goal status: Met on 10/14/23  5.  The patient will have improved knee and hip strength to at least 4/5 needed for standing, walking longer distances  Baseline:  Goal status: Met on 09/26/23   LONG TERM GOALS: Target date: 10/26/2023    The patient will be independent in a safe self progression of a home exercise program to promote further recovery of function  Baseline:  Goal status: Ongoing  2.  The patient will have improved LE strength of at least 4+/5 needed to ascend and descend steps reciprocally with UE support  Baseline:  Goal status: Ongoing  3.  The patient will have an improved BERG balance score to  49/56 indicating reduced risk of falls  Baseline:  Goal status: Met on 10/14/23  4.  ABC scale improved to 60% indicating improved confidence in balance Baseline:  Goal status: Ongoing (see above)  5.  Dizziness Handicap Inventory improved to 50% Baseline: 34/100 (10/10/23) Goal status: MET on 10/10/23    ASSESSMENT:  CLINICAL IMPRESSION: Ms Nurnberg presents to skilled PT reporting continued increased pain and some occasional reports of dizziness.  Able to progress balance challenges with incorporating outside ambulation with head turns.  Additionally utilized mini trampoline today for a non-compliant surface for marching and also head turns with mini bouncing.  Patient with the most difficulty with dizziness with head turns while performing mini bouncing and required support of UE during tasks.  Patient continues with negative Etta Heritage today.  OBJECTIVE IMPAIRMENTS: decreased activity tolerance, decreased balance, difficulty walking, decreased strength, dizziness,  impaired perceived functional ability, impaired UE functional use, and pain.   ACTIVITY LIMITATIONS: carrying, lifting, bending, squatting, stairs, and reach over head  PARTICIPATION LIMITATIONS: meal prep, cleaning, laundry, driving, shopping, and community activity  PERSONAL FACTORS: Time since onset of injury/illness/exacerbation and 1-2 comorbidities: chronic knee pain, diabetes, prior history of dizziness  are also affecting patient's functional outcome.   REHAB POTENTIAL: Good  CLINICAL DECISION MAKING: Stable/uncomplicated  EVALUATION COMPLEXITY: Low  PLAN:  PT FREQUENCY: 2x/week  PT DURATION: 8 weeks  PLANNED INTERVENTIONS: 97164- PT Re-evaluation, 97110-Therapeutic exercises, 97530- Therapeutic activity, 97112- Neuromuscular re-education, 97535- Self Care, 96295- Manual therapy, [phone]- Canalith repositioning, [id number]- Aquatic Therapy, [id number]- Electrical stimulation (unattended), [phone]- Electrical stimulation (manual), [id number]- Ultrasound, 36644- Ionotophoresis 4mg /ml Dexamethasone, Patient/Family education, Taping, Dry Needling, Joint mobilization, Vestibular training, Visual/preceptual remediation/compensation, Cryotherapy, and Moist heat  PLAN FOR NEXT SESSION: Vestibular/canalith reposition as indicated; balance ex's; UE/LE functional strengthening    Robyne Christen, PT, DPT 10/18/23, 11:22 AM  [facility] [address] [place], [place] [address] Phone # [phone] Fax [phone]

## 2023-10-20 ENCOUNTER — Encounter: Payer: Self-pay | Admitting: Rehabilitative and Restorative Service Providers"

## 2023-10-20 ENCOUNTER — Ambulatory Visit: Admitting: Rehabilitative and Restorative Service Providers"

## 2023-10-20 DIAGNOSIS — H9313 Tinnitus, bilateral: Secondary | ICD-10-CM | POA: Diagnosis not present

## 2023-10-20 DIAGNOSIS — H9042 Sensorineural hearing loss, unilateral, left ear, with unrestricted hearing on the contralateral side: Secondary | ICD-10-CM | POA: Diagnosis not present

## 2023-10-20 DIAGNOSIS — H8113 Benign paroxysmal vertigo, bilateral: Secondary | ICD-10-CM

## 2023-10-20 DIAGNOSIS — G8929 Other chronic pain: Secondary | ICD-10-CM

## 2023-10-20 DIAGNOSIS — M25511 Pain in right shoulder: Secondary | ICD-10-CM | POA: Diagnosis not present

## 2023-10-20 DIAGNOSIS — M6281 Muscle weakness (generalized): Secondary | ICD-10-CM

## 2023-10-20 DIAGNOSIS — R42 Dizziness and giddiness: Secondary | ICD-10-CM | POA: Diagnosis not present

## 2023-10-20 DIAGNOSIS — R2689 Other abnormalities of gait and mobility: Secondary | ICD-10-CM

## 2023-10-20 NOTE — Therapy (Signed)
 OUTPATIENT PHYSICAL THERAPY TREATMENT NOTE   Patient Name: Jean Smith MRN: [id number] DOB:30-Jan-1951, 73 y.o., female Today's Date: 10/20/2023   PCP: Bambi Lever DN REFERRING PROVIDER: Ronnell Coins DO Progress Note Reporting Period 08/31/23 to 10/10/23  See note below for Objective Data and Assessment of Progress/Goals.     END OF SESSION:  PT End of Session - 10/20/23 1031     Visit Number 13    Date for PT Re-Evaluation 10/26/23    Authorization Type [employer]    Progress Note Due on Visit 20    PT Start Time 1021    PT Stop Time 1100    PT Time Calculation (min) 39 min    Activity Tolerance Patient tolerated treatment well    Behavior During Therapy WFL for tasks assessed/performed               Past Medical History:  Diagnosis Date   Allergy    Anxiety    Aortic regurgitation    moderate by echo 10/2020   Arthritis    Cancer (HCC)    skin   Cataract    bilateral - MD is just watching    Diverticulosis    Fuchs' endothelial dystrophy    follows with optho regularly    Gestational diabetes    Heart murmur    MVP    History of depression    HSV-2 infection    Hyperlipidemia    Hyperplastic colon polyp    Mitral valve prolapse    mild to moderate MR by echo 10/2020   PVC's (premature ventricular contractions)    intol of BB, sxc palpitations r/t stress   RVOT ventricular tachycardia/PVCs    EP eval 01/2013 for freq PVCs   Past Surgical History:  Procedure Laterality Date   CESAREAN SECTION     x 3   COLONOSCOPY     KNEE SURGERY Right    MANDIBLE SURGERY     right side in front of ear   MOHS SURGERY  2021   POLYPECTOMY     WISDOM TOOTH EXTRACTION     Patient Active Problem List   Diagnosis Date Noted   AC (acromioclavicular) arthritis 05/28/2022   Mitral valve prolapse    Aortic regurgitation    NSVT (nonsustained ventricular tachycardia) (HCC) 09/25/2020   Diabetes mellitus type 2 with complications (HCC)  08/27/2020   Chronic pain of both shoulders 05/05/2018   Other fatigue 01/02/2018   Allergic rhinitis 01/02/2018   Routine general medical examination at a health care facility 09/01/2015   Hyperlipidemia associated with type 2 diabetes mellitus (HCC) 09/01/2015   Varicose vein 08/27/2014   Primary localized osteoarthrosis, lower leg 10/30/2013   RVOT ventricular tachycardia/PVCs 01/31/2013   Abnormal stress echo 12/11/2012    ONSET DATE: February   REFERRING DIAG: vertigo, balance disorder, chronic right knee pain; chronic pain of both shoulders  THERAPY DIAG:  Vertigo; dizziness; balance disorder; generalized weakness; right knee pain  Rationale for Evaluation and Treatment: Rehabilitation  SUBJECTIVE:  SUBJECTIVE STATEMENT: Patient states that she did not sleep well last night.  States that she did get a little dizzy in the shower.  PERTINENT HISTORY:  DM Chronic right knee pain with prior PT ending in Feb (cortisone injection in knee in Feb)  PAIN:  Are you having pain? Yes NPRS scale: 2/10 Pain location: right knee Pain orientation: Bilateral  PAIN TYPE: aching Pain description: intermittent  Aggravating factors: turning head to the right;  a little with looking up at the sky (careful in the shower); careful with bending over (getting stuff out of the fridge low shelf); arms overhead; turning head and looking up at the waitress at the restaurant; leaning over the sink; pushing the grocery cart and reaching for items; can't walk the steps (I have to crawl up them) Relieving factors: keeping head still   PRECAUTIONS: risk of falls    WEIGHT BEARING RESTRICTIONS: No  FALLS: Has patient fallen in last 6 months? Yes. Number of falls 1  LIVING ENVIRONMENT: Lives with: lives with their  spouse Lives in: House/apartment Stairs: Yes: Internal: 12 steps; on right going up and External: 5 steps; on right going up Has following equipment at home: None  PLOF: Independent  PATIENT GOALS: stop dizziness with head movements; improve balance, make my arms and legs stronger  OBJECTIVE:  Note: Objective measures were completed at Evaluation unless otherwise noted. 05/30/2022 DIAGNOSTIC FINDINGS: LEFT SHOULDER - 3 VIEW   COMPARISON:  None Available.   FINDINGS: There is no evidence of fracture or dislocation. Mild glenohumeral osteophytosis. Moderate acromioclavicular joint space loss and osteophytosis. No soft tissue abnormalities. The visualized left lung is clear.   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     COGNITION: Overall cognitive status: Within functional limits for tasks assessed  Cervical ROM: 25% limited with rotation and extension (slow movement) UPPER EXTREMITY ROM: WFLs UPPER EXTREMITY MMT: shoulders grossly 4/5  LOWER EXTREMITY ROM:    right knee 3-115, left knee 0-120 degrees LOWER EXTREMITY MMT:   Eval:  right knee extension 4-/5; left knee extension 4/5; left hip abduction 4/5;  right hip abduction 4-/5; SLS on left 9 sec, right 3 sec with pelvic drop indicating glute weakness on right   09/26/2023: Bilateral hip strength of grossly 4/5 Bilateral knee strength of grossly 4 to 4+/5   PATIENT SURVEYS:  Eval: Dizziness handicap Inventory 62% Activities specific Balance Confidence Scale: 45.62% (low confidence in balance)  09/26/2023: Total ABC score: 760 / 1600 = 47.5 % DHI Total Score: 56 / 100  10/10/23:  DHI total score: 34/100  FUNCTIONAL ASSESSMENT:  Eval: BERG balance test:    44  /56 5x STS 14.30 sec no UE assist needed  10/14/2023: BERG: 53/56 Single leg stance:  Left- 10 sec, Right- 9 sec   VESTIBULAR ASSESSMENT: 09/06/2023: Smooth Pursuit:  WFL Gaze Stabilization:  WFL Saccades:   WFL Head Thrust:  positive for nystagmus Dix Hallpike: Negative bilat Supine Head Roll Test:  positive on the right, negative on the left Canalith Repositioning:  Barbecue Roll to treat the right  TODAY'S TREATMENT  DATE: 10/20/2023 Recumbant bike L2 x 6 min with PT present to monitor and discuss status Seated hamstring stretch 2x20 sec bilat Tandem walking on AirEx beam in parallel bars with UE support, as needed, down and back x3 Side stepping on AirEx beam in parallel bars with UE support, as needed, down and back x3 Marching on trampoline 2x1 min with unilateral UE support (cuing for core engagement) Mini bouncing on trampoline performing head turns and head nods x30 sec each FWD step over 4 small hurdles in parallel bars down and back x3-no UE support Side step over 4 small hurdles in parallel bars down and back x3- no UE support  Standing on wobble board 2x1 min with UE support, as needed for balance   DATE: 10/18/2023 Recumbant bike L3 x 6 min with PT present to monitor and discuss status Dix Hallpike negative bilat Tandem walking on AirEx beam in parallel bars with UE support, as needed, down and back x3 Side stepping on AirEx beam in parallel bars with UE support, as needed, down and back x3 Ambulation outside on sidewalk with horizontal and vertical head turns 2x20 ft each FWD step ups on curb without UE support x10 bilat Marching on trampoline 2x1 min with unilateral UE support Mini bouncing on trampoline performing head turns and head nods x30 sec each Seated hamstring stretch 2x20 sec bilat   DATE: 10/14/2023 Nustep level 5 x6 min with PT prsent to discuss status BERG 53/56 Alternating step taps on 6" step with min to no hand held assist by PT Tandem walking on AirEx beam in parallel bars with UE support, as needed, down and back x3 Side stepping on  AirEx beam in parallel bars with UE support, as needed, down and back x3 FWD step ups on bosu x10 bilat Side step ups and over and back x5  FWD partial lunge onto bosu x10 bilat Standing on foam pad in parallel bars: performing head nods and head rotations 2x1 min each Standing on foam pad with eyes closed 2x30 sec with close SBA/CGA Step down from 2" step 2x10 bilat   PATIENT EDUCATION: Education details: Educated patient on anatomy and physiology of current symptoms, prognosis, plan of care as well as initial self care strategies to promote recovery Person educated: Patient Education method: Explanation Education comprehension: verbalized understanding  HOME EXERCISE PROGRAM: Access Code: [email or web address] URL: [email or web address] Date: 09/20/2023 Prepared by: Raynell Caller  Exercises - Brandt-Daroff Vestibular Exercise  - 1 x daily - 7 x weekly - 3-5 reps - Seated Gaze Stabilization with Head Rotation  - 1 x daily - 7 x weekly - 2 sets - 30 reps - Seated Gaze Stabilization with Head Nod  - 1 x daily - 7 x weekly - 2 sets - 30 reps - Standing Feet Together Smooth Pursuit  - 1 x daily - 7 x weekly - 2 sets - 30 sec hold - Standing Horizontal Saccades  - 1 x daily - 7 x weekly - 2 sets - 30 sec hold - Standing Vertical Saccades  - 1 x daily - 7 x weekly - 2 sets - 30 sec hold - Seated March with Resistance  - 1 x daily - 7 x weekly - 2 sets - 20 reps - Seated Hip Abduction with Resistance  - 1 x daily - 7 x weekly - 2 sets - 10 reps - Seated Hamstring Curls with Resistance  - 1 x daily - 7 x weekly - 2 sets -  10 reps - Seated Long Arc Quad with Hip Adduction  - 1 x daily - 7 x weekly - 2 sets - 10 reps  GOALS: Goals reviewed with patient? Yes  SHORT TERM GOALS: Target date: 09/28/2023  The patient will demonstrate knowledge of basic self care strategies and exercises to promote healing  Baseline: Goal status: Met on 10/14/23  2.  Improvement in dizziness with head  movements by 50% Baseline:  Goal status: Met on 09/26/23   3.  Patient will be able to lean over the sink and dishwasher with minimal dizziness Baseline:  Goal status: Met on 10/14/23  4.  The patient will have an improved BERG balance score to  47  /56 indicating reduced risk of falls  Baseline:  Goal status: Met on 10/14/23  5.  The patient will have improved knee and hip strength to at least 4/5 needed for standing, walking longer distances  Baseline:  Goal status: Met on 09/26/23   LONG TERM GOALS: Target date: 10/26/2023    The patient will be independent in a safe self progression of a home exercise program to promote further recovery of function  Baseline:  Goal status: Ongoing  2.  The patient will have improved LE strength of at least 4+/5 needed to ascend and descend steps reciprocally with UE support  Baseline:  Goal status: Ongoing  3.  The patient will have an improved BERG balance score to  49/56 indicating reduced risk of falls  Baseline:  Goal status: Met on 10/14/23  4.  ABC scale improved to 60% indicating improved confidence in balance Baseline:  Goal status: Ongoing (see above)  5.  Dizziness Handicap Inventory improved to 50% Baseline: 34/100 (10/10/23) Goal status: MET on 10/10/23    ASSESSMENT:  CLINICAL IMPRESSION: Ms Croyle presents to skilled PT reporting that she did not sleep well.  Patient goes to see the audiologist later today.  Reduced tension on bike secondary to fatigue from not sleeping well last night.  Patient continues to progress with standing balance tasks.  Patient with good participation throughout session.  Patient continues to require skilled PT to progress with goal related activities.  OBJECTIVE IMPAIRMENTS: decreased activity tolerance, decreased balance, difficulty walking, decreased strength, dizziness, impaired perceived functional ability, impaired UE functional use, and pain.   ACTIVITY LIMITATIONS: carrying, lifting,  bending, squatting, stairs, and reach over head  PARTICIPATION LIMITATIONS: meal prep, cleaning, laundry, driving, shopping, and community activity  PERSONAL FACTORS: Time since onset of injury/illness/exacerbation and 1-2 comorbidities: chronic knee pain, diabetes, prior history of dizziness are also affecting patient's functional outcome.   REHAB POTENTIAL: Good  CLINICAL DECISION MAKING: Stable/uncomplicated  EVALUATION COMPLEXITY: Low  PLAN:  PT FREQUENCY: 2x/week  PT DURATION: 8 weeks  PLANNED INTERVENTIONS: 97164- PT Re-evaluation, 97110-Therapeutic exercises, 97530- Therapeutic activity, 97112- Neuromuscular re-education, 97535- Self Care, 65784- Manual therapy, [phone]- Canalith repositioning, [id number]- Aquatic Therapy, [id number]- Electrical stimulation (unattended), [phone]- Electrical stimulation (manual), [id number]- Ultrasound, 10272- Ionotophoresis 4mg /ml Dexamethasone, Patient/Family education, Taping, Dry Needling, Joint mobilization, Vestibular training, Visual/preceptual remediation/compensation, Cryotherapy, and Moist heat  PLAN FOR NEXT SESSION: Vestibular/canalith reposition as indicated; balance ex's; UE/LE functional strengthening    Robyne Christen, PT, DPT 10/20/23, 12:01 PM  [facility] [address], [address] [place], [place] [address] Phone # [phone] Fax [phone]

## 2023-10-24 ENCOUNTER — Encounter: Admitting: Rehabilitative and Restorative Service Providers"

## 2023-10-26 ENCOUNTER — Ambulatory Visit: Admitting: Rehabilitative and Restorative Service Providers"

## 2023-10-26 ENCOUNTER — Encounter: Payer: Self-pay | Admitting: Rehabilitative and Restorative Service Providers"

## 2023-10-26 DIAGNOSIS — R42 Dizziness and giddiness: Secondary | ICD-10-CM

## 2023-10-26 DIAGNOSIS — H8113 Benign paroxysmal vertigo, bilateral: Secondary | ICD-10-CM

## 2023-10-26 DIAGNOSIS — R2689 Other abnormalities of gait and mobility: Secondary | ICD-10-CM

## 2023-10-26 DIAGNOSIS — M6281 Muscle weakness (generalized): Secondary | ICD-10-CM

## 2023-10-26 DIAGNOSIS — G8929 Other chronic pain: Secondary | ICD-10-CM

## 2023-10-26 DIAGNOSIS — M25511 Pain in right shoulder: Secondary | ICD-10-CM | POA: Diagnosis not present

## 2023-10-26 NOTE — Therapy (Signed)
 OUTPATIENT PHYSICAL THERAPY TREATMENT NOTE AND DISCHARGE SUMMARY   Patient Name: Jean Smith MRN: [id number] DOB:1950-12-23, 73 y.o., female Today's Date: 10/26/2023   PCP: Bambi Lever DN REFERRING PROVIDER: Ronnell Coins DO     END OF SESSION:  PT End of Session - 10/26/23 1030     Visit Number 14    Date for PT Re-Evaluation 10/26/23    Authorization Type [employer]    Progress Note Due on Visit 20    PT Start Time 1026   Pt arrived late for her appointment   PT Stop Time 1100    PT Time Calculation (min) 34 min    Activity Tolerance Patient tolerated treatment well    Behavior During Therapy [facility] for tasks assessed/performed               Past Medical History:  Diagnosis Date   Allergy    Anxiety    Aortic regurgitation    moderate by echo 10/2020   Arthritis    Cancer (HCC)    skin   Cataract    bilateral - MD is just watching    Diverticulosis    Fuchs' endothelial dystrophy    follows with optho regularly    Gestational diabetes    Heart murmur    MVP    History of depression    HSV-2 infection    Hyperlipidemia    Hyperplastic colon polyp    Mitral valve prolapse    mild to moderate MR by echo 10/2020   PVC's (premature ventricular contractions)    intol of BB, sxc palpitations r/t stress   RVOT ventricular tachycardia/PVCs    EP eval 01/2013 for freq PVCs   Past Surgical History:  Procedure Laterality Date   CESAREAN SECTION     x 3   COLONOSCOPY     KNEE SURGERY Right    MANDIBLE SURGERY     right side in front of ear   MOHS SURGERY  2021   POLYPECTOMY     WISDOM TOOTH EXTRACTION     Patient Active Problem List   Diagnosis Date Noted   AC (acromioclavicular) arthritis 05/28/2022   Mitral valve prolapse    Aortic regurgitation    NSVT (nonsustained ventricular tachycardia) (HCC) 09/25/2020   Diabetes mellitus type 2 with complications (HCC) 08/27/2020   Chronic pain of both shoulders 05/05/2018   Other  fatigue 01/02/2018   Allergic rhinitis 01/02/2018   Routine general medical examination at a health care facility 09/01/2015   Hyperlipidemia associated with type 2 diabetes mellitus (HCC) 09/01/2015   Varicose vein 08/27/2014   Primary localized osteoarthrosis, lower leg 10/30/2013   RVOT ventricular tachycardia/PVCs 01/31/2013   Abnormal stress echo 12/11/2012    ONSET DATE: February   REFERRING DIAG: vertigo, balance disorder, chronic right knee pain; chronic pain of both shoulders  THERAPY DIAG:  Vertigo; dizziness; balance disorder; generalized weakness; right knee pain  Rationale for Evaluation and Treatment: Rehabilitation  SUBJECTIVE:  SUBJECTIVE STATEMENT: Patient states that she went to the Audiologist last week and her tinnitus was worse on her left side.  Patient had negative BPPV testing with Audiologist office.  PERTINENT HISTORY:  DM Chronic right knee pain with prior PT ending in Feb (cortisone injection in knee in Feb)  PAIN:  Are you having pain? Yes NPRS scale: 6/10 Pain location: right knee Pain orientation: Bilateral  PAIN TYPE: aching Pain description: intermittent  Aggravating factors: turning head to the right;  a little with looking up at the sky (careful in the shower); careful with bending over (getting stuff out of the fridge low shelf); arms overhead; turning head and looking up at the waitress at the restaurant; leaning over the sink; pushing the grocery cart and reaching for items; can't walk the steps (I have to crawl up them) Relieving factors: keeping head still   PRECAUTIONS: risk of falls    WEIGHT BEARING RESTRICTIONS: No  FALLS: Has patient fallen in last 6 months? Yes. Number of falls 1  LIVING ENVIRONMENT: Lives with: lives with their spouse Lives  in: House/apartment Stairs: Yes: Internal: 12 steps; on right going up and External: 5 steps; on right going up Has following equipment at home: None  PLOF: Independent  PATIENT GOALS: stop dizziness with head movements; improve balance, make my arms and legs stronger  OBJECTIVE:  Note: Objective measures were completed at Evaluation unless otherwise noted. 05/30/2022 DIAGNOSTIC FINDINGS: LEFT SHOULDER - 3 VIEW   COMPARISON:  None Available.   FINDINGS: There is no evidence of fracture or dislocation. Mild glenohumeral osteophytosis. Moderate acromioclavicular joint space loss and osteophytosis. No soft tissue abnormalities. The visualized left lung is clear.   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     COGNITION: Overall cognitive status: Within functional limits for tasks assessed  Cervical ROM: 25% limited with rotation and extension (slow movement) UPPER EXTREMITY ROM: WFLs UPPER EXTREMITY MMT: shoulders grossly 4/5  LOWER EXTREMITY ROM:    right knee 3-115, left knee 0-120 degrees LOWER EXTREMITY MMT:   Eval:  right knee extension 4-/5; left knee extension 4/5; left hip abduction 4/5;  right hip abduction 4-/5; SLS on left 9 sec, right 3 sec with pelvic drop indicating glute weakness on right   09/26/2023: Bilateral hip strength of grossly 4/5 Bilateral knee strength of grossly 4 to 4+/5  10/26/2023: Bilateral LE strength is WFL   PATIENT SURVEYS:  Eval: Dizziness handicap Inventory 62% Activities specific Balance Confidence Scale: 45.62% (low confidence in balance)  09/26/2023: Total ABC score: 760 / 1600 = 47.5 % DHI Total Score: 56 / 100  10/10/23:  DHI total score: 34/100  10/26/2023: Total ABC score: 1170 / 1600 = 73.1 %  FUNCTIONAL ASSESSMENT:  Eval: BERG balance test:    44  /56 5x STS 14.30 sec no UE assist needed  10/14/2023: BERG: 53/56 Single leg stance:  Left- 10 sec, Right- 9 sec   VESTIBULAR  ASSESSMENT: 09/06/2023: Smooth Pursuit:  WFL Gaze Stabilization:  WFL Saccades:  WFL Head Thrust:  positive for nystagmus Dix Hallpike: Negative bilat Supine Head Roll Test:  positive on the right, negative on the left Canalith Repositioning:  Barbecue Roll to treat the right  TODAY'S TREATMENT  DATE: 10/26/2023 Recumbant bike L3 x 6 min with PT present to monitor and discuss status Marching on trampoline 2x1 min with unilateral UE support (cuing for core engagement) Mini bouncing on trampoline performing head turns and head nods x30 sec each Tandem walking on AirEx beam in parallel bars with UE support, as needed, down and back x3 Side stepping on AirEx beam in parallel bars with UE support, as needed, down and back x3 FWD step over 4 small hurdles in parallel bars down and back x3-no UE support Side step over 4 small hurdles in parallel bars down and back x3- no UE support  FWD lunges onto bosu x10 bilat   DATE: 10/20/2023 Recumbant bike L2 x 6 min with PT present to monitor and discuss status Seated hamstring stretch 2x20 sec bilat Tandem walking on AirEx beam in parallel bars with UE support, as needed, down and back x3 Side stepping on AirEx beam in parallel bars with UE support, as needed, down and back x3 Marching on trampoline 2x1 min with unilateral UE support (cuing for core engagement) Mini bouncing on trampoline performing head turns and head nods x30 sec each FWD step over 4 small hurdles in parallel bars down and back x3-no UE support Side step over 4 small hurdles in parallel bars down and back x3- no UE support  Standing on wobble board 2x1 min with UE support, as needed for balance   DATE: 10/18/2023 Recumbant bike L3 x 6 min with PT present to monitor and discuss status Dix Hallpike negative bilat Tandem walking on AirEx beam in parallel bars  with UE support, as needed, down and back x3 Side stepping on AirEx beam in parallel bars with UE support, as needed, down and back x3 Ambulation outside on sidewalk with horizontal and vertical head turns 2x20 ft each FWD step ups on curb without UE support x10 bilat Marching on trampoline 2x1 min with unilateral UE support Mini bouncing on trampoline performing head turns and head nods x30 sec each Seated hamstring stretch 2x20 sec bilat    PATIENT EDUCATION: Education details: Educated patient on anatomy and physiology of current symptoms, prognosis, plan of care as well as initial self care strategies to promote recovery Person educated: Patient Education method: Explanation Education comprehension: verbalized understanding  HOME EXERCISE PROGRAM: Access Code: [email or web address] URL: [email or web address] Date: 09/20/2023 Prepared by: Raynell Caller  Exercises - Brandt-Daroff Vestibular Exercise  - 1 x daily - 7 x weekly - 3-5 reps - Seated Gaze Stabilization with Head Rotation  - 1 x daily - 7 x weekly - 2 sets - 30 reps - Seated Gaze Stabilization with Head Nod  - 1 x daily - 7 x weekly - 2 sets - 30 reps - Standing Feet Together Smooth Pursuit  - 1 x daily - 7 x weekly - 2 sets - 30 sec hold - Standing Horizontal Saccades  - 1 x daily - 7 x weekly - 2 sets - 30 sec hold - Standing Vertical Saccades  - 1 x daily - 7 x weekly - 2 sets - 30 sec hold - Seated March with Resistance  - 1 x daily - 7 x weekly - 2 sets - 20 reps - Seated Hip Abduction with Resistance  - 1 x daily - 7 x weekly - 2 sets - 10 reps - Seated Hamstring Curls with Resistance  - 1 x daily - 7 x weekly - 2 sets - 10 reps - Seated [employer]  with Hip Adduction  - 1 x daily - 7 x weekly - 2 sets - 10 reps  GOALS: Goals reviewed with patient? Yes  SHORT TERM GOALS: Target date: 09/28/2023  The patient will demonstrate knowledge of basic self care strategies and exercises to promote healing   Baseline: Goal status: Met on 10/14/23  2.  Improvement in dizziness with head movements by 50% Baseline:  Goal status: Met on 09/26/23   3.  Patient will be able to lean over the sink and dishwasher with minimal dizziness Baseline:  Goal status: Met on 10/14/23  4.  The patient will have an improved BERG balance score to  47  /56 indicating reduced risk of falls  Baseline:  Goal status: Met on 10/14/23  5.  The patient will have improved knee and hip strength to at least 4/5 needed for standing, walking longer distances  Baseline:  Goal status: Met on 09/26/23   LONG TERM GOALS: Target date: 10/26/2023    The patient will be independent in a safe self progression of a home exercise program to promote further recovery of function  Baseline:  Goal status: Met on 10/26/2023  2.  The patient will have improved LE strength of at least 4+/5 needed to ascend and descend steps reciprocally with UE support  Baseline:  Goal status: Met on 10/26/2023  3.  The patient will have an improved BERG balance score to  49/56 indicating reduced risk of falls  Baseline:  Goal status: Met on 10/14/23  4.  ABC scale improved to 60% indicating improved confidence in balance Baseline:  Goal status: Met on 10/26/2023  5.  Dizziness Handicap Inventory improved to 50% Baseline: 34/100 (10/10/23) Goal status: MET on 10/10/23    ASSESSMENT:  CLINICAL IMPRESSION: Ms Junk presents to skilled PT reporting that she has some increased knee pain today, states that she felt it lock over the weekend and she's had increased pain since then.  Patient has a follow up appointment with Dr Felipe Horton later this month and advised her to reach out with him about other treatment options, as she has done significant PT for her knee.  Patient has progressed with her dizziness and no longer has symptoms as she did before.  Patient is negative on [facility] and has vastly improved ABC score at this time.  Patient discharged from  PT at this time to continue with HEP for LE strengthening and her balance.  OBJECTIVE IMPAIRMENTS: decreased activity tolerance, decreased balance, difficulty walking, decreased strength, dizziness, impaired perceived functional ability, impaired UE functional use, and pain.   ACTIVITY LIMITATIONS: carrying, lifting, bending, squatting, stairs, and reach over head  PARTICIPATION LIMITATIONS: meal prep, cleaning, laundry, driving, shopping, and community activity  PERSONAL FACTORS: Time since onset of injury/illness/exacerbation and 1-2 comorbidities: chronic knee pain, diabetes, prior history of dizziness are also affecting patient's functional outcome.   REHAB POTENTIAL: Good  CLINICAL DECISION MAKING: Stable/uncomplicated  EVALUATION COMPLEXITY: Low  PLAN:  PT FREQUENCY: 2x/week  PT DURATION: 8 weeks  PLANNED INTERVENTIONS: 97164- PT Re-evaluation, 97110-Therapeutic exercises, 97530- Therapeutic activity, [id number]- Neuromuscular re-education, 97535- Self Care, [address]- Manual therapy, [id number]- Canalith repositioning, [id number]- Aquatic Therapy, U0454- Electrical stimulation (unattended), [id number]- Electrical stimulation (manual), [id number]- Ultrasound, 09811- Ionotophoresis 4mg /ml Dexamethasone, Patient/Family education, Taping, Dry Needling, Joint mobilization, Vestibular training, Visual/preceptual remediation/compensation, Cryotherapy, and Moist heat   PHYSICAL THERAPY DISCHARGE SUMMARY  See above for complete details of care.  Patient agrees to discharge. Patient goals were met. Patient is being discharged due  to meeting the stated rehab goals.     Robyne Christen, PT, DPT 10/26/23, 11:34 AM  [facility] [address], [address] [place], [place] [address] Phone # [phone] Fax [phone]

## 2023-10-28 ENCOUNTER — Ambulatory Visit: Admitting: Physical Therapy

## 2023-11-04 NOTE — Progress Notes (Deleted)
 [facility] [address] [place] [address] Phone: [phone] Subjective:    I'm seeing this patient by the request  of:  Adelia Homestead, MD  CC:   [id number]  08/10/2023 Pt for shoulder  Pt for balance and coordination      Injection given. Discussed icing regimen  Discussed bracing. Increase activity noted. RTC in 6-8 weeks      Updated 11/10/2023 Jean Smith is a 73 y.o. female coming in with complaint of shoulder and leg pain       Past Medical History:  Diagnosis Date   Allergy    Anxiety    Aortic regurgitation    moderate by echo 10/2020   Arthritis    Cancer (HCC)    skin   Cataract    bilateral - MD is just watching    Diverticulosis    Fuchs' endothelial dystrophy    follows with optho regularly    Gestational diabetes    Heart murmur    MVP    History of depression    HSV-2 infection    Hyperlipidemia    Hyperplastic colon polyp    Mitral valve prolapse    mild to moderate MR by echo 10/2020   PVC's (premature ventricular contractions)    intol of BB, sxc palpitations r/t stress   RVOT ventricular tachycardia/PVCs    EP eval 01/2013 for freq PVCs   Past Surgical History:  Procedure Laterality Date   CESAREAN SECTION     x 3   COLONOSCOPY     KNEE SURGERY Right    MANDIBLE SURGERY     right side in front of ear   MOHS SURGERY  2021   POLYPECTOMY     WISDOM TOOTH EXTRACTION     Social History   Socioeconomic History   Marital status: Married    Spouse name: Not on file   Number of children: 3   Years of education: Not on file   Highest education level: Some college, no degree  Occupational History   Not on file  Tobacco Use   Smoking status: Former    Current packs/day: 0.00    Types: Cigarettes    Quit date: 06/15/1983    Years since quitting: 40.4   Smokeless tobacco: Never  Vaping Use   Vaping status: Never Used  Substance and Sexual Activity   Alcohol use: Not Currently     Comment: occasional   Drug use: No   Sexual activity: Not Currently    Birth control/protection: Post-menopausal  Other Topics Concern   Not on file  Social History Narrative   Regular exercise: yes   Caffeine use: none   Social Drivers of Corporate investment banker Strain: Low Risk  (03/23/2023)   Overall Financial Resource Strain (CARDIA)    Difficulty of Paying Living Expenses: Not hard at all  Food Insecurity: No Food Insecurity (03/23/2023)   Hunger Vital Sign    Worried About Running Out of Food in the Last Year: Never true    Ran Out of Food in the Last Year: Never true  Transportation Needs: No Transportation Needs (03/23/2023)   PRAPARE - Administrator, Civil Service (Medical): No    Lack of Transportation (Non-Medical): No  Physical Activity: Unknown (03/23/2023)   Exercise Vital Sign    Days of Exercise per Week: Patient declined    Minutes of Exercise per Session: Not on file  Stress: Stress Concern  Present (03/23/2023)   [employer] of Occupational Health - Occupational Stress Questionnaire    Feeling of Stress : To some extent  Social Connections: Socially Integrated (03/23/2023)   Social Connection and Isolation Panel [NHANES]    Frequency of Communication with Friends and Family: Three times a week    Frequency of Social Gatherings with Friends and Family: Patient declined    Attends Religious Services: 1 to 4 times per year    Active Member of [employer] or Organizations: Yes    Attends Banker Meetings: 1 to 4 times per year    Marital Status: Married   Allergies  Allergen Reactions   Latex Swelling   Other     Grass, local trees, cats and dogs   Pollen Extract    Family History  Problem Relation Age of Onset   Colon cancer Mother        dx'd in her 36's-- stage 2    Hypertension Father    Heart disease Father    Hyperlipidemia Father    Thyroid  disease Sister    Colon polyps Sister    Colon polyps Sister    Colon  cancer Maternal Grandmother    Colon polyps Daughter    Alcohol abuse Other    Rectal cancer Neg Hx    Stomach cancer Neg Hx    Esophageal cancer Neg Hx      Current Outpatient Medications (Cardiovascular):    propranolol  (INDERAL ) 10 MG tablet, Take 10 mg by mouth as needed (increased HR and skipped beats).   rosuvastatin  (CRESTOR ) 5 MG tablet, Take 1 tablet (5 mg total) by mouth daily.  Current Outpatient Medications (Respiratory):    loratadine (CLARITIN) 10 MG tablet, Take 10 mg by mouth daily as needed for allergies.    Current Outpatient Medications (Other):    ALPRAZolam  (XANAX ) 0.25 MG tablet, TAKE 1 TABLET BY MOUTH EVERY DAY AS NEEDED FOR ANXIETY   ascorbic acid (VITAMIN C) 500 MG tablet, Take 500 mg by mouth every 3 (three) days. [phone] MG   b complex vitamins capsule, Take 1 capsule by mouth every 3 (three) days.   Cholecalciferol (VITAMIN D -3) 125 MCG (5000 UT) TABS, Take 1 tablet by mouth every 3 (three) days.   Continuous Glucose Sensor (FREESTYLE LIBRE 3 PLUS SENSOR) MISC, 1 each by Does not apply route every 14 (fourteen) days.   MAGNESIUM GLUCONATE PO, Take 400 mg by mouth daily.   Reviewed prior external information including notes and imaging from  primary care provider As well as notes that were available from care everywhere and other healthcare systems.  Past medical history, social, surgical and family history all reviewed in electronic medical record.  No pertanent information unless stated regarding to the chief complaint.   Review of Systems:  No headache, visual changes, nausea, vomiting, diarrhea, constipation, dizziness, abdominal pain, skin rash, fevers, chills, night sweats, weight loss, swollen lymph nodes, body aches, joint swelling, chest pain, shortness of breath, mood changes. POSITIVE muscle aches  Objective  There were no vitals taken for this visit.   General: No apparent distress alert and oriented x3 mood and affect normal, dressed  appropriately.  HEENT: Pupils equal, extraocular movements intact  Respiratory: Patient's speak in full sentences and does not appear short of breath  Cardiovascular: No lower extremity edema, non tender, no erythema      Impression and Recommendations:

## 2023-11-10 ENCOUNTER — Ambulatory Visit

## 2023-11-10 ENCOUNTER — Ambulatory Visit: Admitting: Family Medicine

## 2023-11-10 VITALS — Ht 64.0 in | Wt 176.0 lb

## 2023-11-10 DIAGNOSIS — Z0000 Encounter for general adult medical examination without abnormal findings: Secondary | ICD-10-CM | POA: Diagnosis not present

## 2023-11-10 DIAGNOSIS — Z1231 Encounter for screening mammogram for malignant neoplasm of breast: Secondary | ICD-10-CM

## 2023-11-10 NOTE — Progress Notes (Signed)
 Subjective:  Please attest and cosign this visit due to patients primary care provider not being in the office at the time the visit was completed.  (Pt of Dr Bambi Lever)   Jean Smith is a 73 y.o. who presents for a Medicare Wellness preventive visit.  As a reminder, Annual Wellness Visits don't include a physical exam, and some assessments may be limited, especially if this visit is performed virtually. We may recommend an in-person follow-up visit with your provider if needed.  Visit Complete: Virtual I connected with  Norval Been on 11/10/23 by a audio enabled telemedicine application and verified that I am speaking with the correct person using two identifiers.  Patient Location: Home  Provider Location: Office/Clinic  I discussed the limitations of evaluation and management by telemedicine. The patient expressed understanding and agreed to proceed.  Vital Signs: Because this visit was a virtual/telehealth visit, some criteria may be missing or patient reported. Any vitals not documented were not able to be obtained and vitals that have been documented are patient reported.  VideoDeclined- This patient declined Librarian, academic. Therefore the visit was completed with audio only.  Persons Participating in Visit: Patient.  AWV Questionnaire: Yes: Patient Medicare AWV questionnaire was completed by the patient on 11/09/2023; I have confirmed that all information answered by patient is correct and no changes since this date.  Cardiac Risk Factors include: advanced age (>49men, >27 women);dyslipidemia;diabetes mellitus     Objective:     Today's Vitals   11/10/23 1015  Weight: 176 lb (79.8 kg)  Height: 5\' 4"  (1.626 m)   Body mass index is 30.21 kg/m.     11/10/2023   10:13 AM 08/31/2023   12:36 PM 08/12/2023    5:20 PM 04/06/2023    4:41 PM 06/16/2021    2:17 PM 09/10/2020   10:29 AM 07/05/2019    9:24 AM  Advanced Directives   Does Patient Have a Medical Advance Directive? Yes Yes Yes No Yes Yes No  Type of Estate agent of [place];Living will Healthcare Power of [place];Living will Healthcare Power of [place];Living will  Healthcare Power of Attorney    Does patient want to make changes to medical advance directive?     No - Patient declined    Copy of Healthcare Power of Attorney in Chart? No - copy requested    No - copy requested    Would patient like information on creating a medical advance directive?    No - Patient declined       Current Medications (verified) Outpatient Encounter Medications as of 11/10/2023  Medication Sig   ALPRAZolam  (XANAX ) 0.25 MG tablet TAKE 1 TABLET BY MOUTH EVERY DAY AS NEEDED FOR ANXIETY   b complex vitamins capsule Take 1 capsule by mouth every 3 (three) days.   Cholecalciferol (VITAMIN D -3) 125 MCG (5000 UT) TABS Take 1 tablet by mouth every 3 (three) days.   Continuous Glucose Sensor (FREESTYLE LIBRE 3 PLUS SENSOR) MISC 1 each by Does not apply route every 14 (fourteen) days.   loratadine (CLARITIN) 10 MG tablet Take 10 mg by mouth daily as needed for allergies.   MAGNESIUM GLUCONATE PO Take 400 mg by mouth daily.   rosuvastatin  (CRESTOR ) 5 MG tablet Take 1 tablet (5 mg total) by mouth daily.   ascorbic acid (VITAMIN C) 500 MG tablet Take 500 mg by mouth every 3 (three) days. [phone] MG   propranolol  (INDERAL ) 10 MG tablet Take 10  mg by mouth as needed (increased HR and skipped beats). (Patient not taking: Reported on 11/10/2023)   No facility-administered encounter medications on file as of 11/10/2023.    Allergies (verified) Latex, Other, and Pollen extract   History: Past Medical History:  Diagnosis Date   Allergy    Anxiety    Aortic regurgitation    moderate by echo 10/2020   Arthritis    Cancer (HCC)    skin   Cataract    bilateral - MD is just watching    Diverticulosis    Fuchs' endothelial dystrophy    follows with optho regularly     Gestational diabetes    Heart murmur    MVP    History of depression    HSV-2 infection    Hyperlipidemia    Hyperplastic colon polyp    Mitral valve prolapse    mild to moderate MR by echo 10/2020   PVC's (premature ventricular contractions)    intol of BB, sxc palpitations r/t stress   RVOT ventricular tachycardia/PVCs    EP eval 01/2013 for freq PVCs   Past Surgical History:  Procedure Laterality Date   CESAREAN SECTION     x 3   COLONOSCOPY     KNEE SURGERY Right    MANDIBLE SURGERY     right side in front of ear   MOHS SURGERY  2021   POLYPECTOMY     WISDOM TOOTH EXTRACTION     Family History  Problem Relation Age of Onset   Colon cancer Mother        dx'd in her 77's-- stage 2    Hypertension Father    Heart disease Father    Hyperlipidemia Father    Thyroid  disease Sister    Colon polyps Sister    Colon polyps Sister    Colon cancer Maternal Grandmother    Colon polyps Daughter    Alcohol abuse Other    Rectal cancer Neg Hx    Stomach cancer Neg Hx    Esophageal cancer Neg Hx    Social History   Socioeconomic History   Marital status: Married    Spouse name: Not on file   Number of children: 3   Years of education: Not on file   Highest education level: Some college, no degree  Occupational History   Not on file  Tobacco Use   Smoking status: Former    Current packs/day: 0.00    Types: Cigarettes    Quit date: 06/15/1983    Years since quitting: 40.4    Passive exposure: Past   Smokeless tobacco: Never  Vaping Use   Vaping status: Never Used  Substance and Sexual Activity   Alcohol use: Not Currently    Comment: occasional   Drug use: No   Sexual activity: Not Currently    Birth control/protection: Post-menopausal  Other Topics Concern   Not on file  Social History Narrative   Regular exercise: yes   Caffeine use: none   Social Drivers of Corporate investment banker Strain: Low Risk  (11/10/2023)   Overall Financial Resource Strain  (CARDIA)    Difficulty of Paying Living Expenses: Not hard at all  Food Insecurity: No Food Insecurity (11/10/2023)   Hunger Vital Sign    Worried About Running Out of Food in the Last Year: Never true    Ran Out of Food in the Last Year: Never true  Transportation Needs: No Transportation Needs (11/10/2023)   PRAPARE - Transportation  Lack of Transportation (Medical): No    Lack of Transportation (Non-Medical): No  Physical Activity: Insufficiently Active (11/10/2023)   Exercise Vital Sign    Days of Exercise per Week: 2 days    Minutes of Exercise per Session: 30 min  Stress: No Stress Concern Present (11/10/2023)   [employer] of Occupational Health - Occupational Stress Questionnaire    Feeling of Stress : Only a little  Social Connections: Moderately Integrated (11/10/2023)   Social Connection and Isolation Panel [NHANES]    Frequency of Communication with Friends and Family: More than three times a week    Frequency of Social Gatherings with Friends and Family: Once a week    Attends Religious Services: Never    Database administrator or Organizations: Yes    Attends Engineer, structural: 1 to 4 times per year    Marital Status: Married    Tobacco Counseling Counseling given: No    Clinical Intake:  Pre-visit preparation completed: Yes  Pain : No/denies pain     BMI - recorded: 30.21 Nutritional Risks: None Diabetes: Yes CBG done?: No Did pt. bring in CBG monitor from home?: No  Lab Results  Component Value Date   HGBA1C 6.7 (A) 09/23/2023   HGBA1C 6.4 (A) 06/09/2023   HGBA1C 6.5 03/24/2023     How often do you need to have someone help you when you read instructions, pamphlets, or other written materials from your doctor or pharmacy?: 1 - Never  Interpreter Needed?: No  Information entered by :: Kandy Orris, CMA   Activities of Daily Living     11/10/2023   10:18 AM 11/09/2023    6:49 PM  In your present state of health, do you have  any difficulty performing the following activities:  Hearing? 0 0  Vision? 0 0  Difficulty concentrating or making decisions? 0 0  Walking or climbing stairs? 1 1  Comment due to knee pain   Dressing or bathing? 0 0  Doing errands, shopping? 0 0  Preparing Food and eating ? N N  Using the Toilet? N N  In the past six months, have you accidently leaked urine? N N  Do you have problems with loss of bowel control? N N  Managing your Medications? N N  Managing your Finances? N N  Housekeeping or managing your Housekeeping? N N    Patient Care Team: Adelia Homestead, MD as PCP - General (Internal Medicine) Jacqueline Matsu, MD as PCP - Cardiology (Cardiology) Boyce Byes, MD as PCP - Electrophysiology (Cardiology) Verona Goodwill, MD (Cardiology) Nahser, Lela Purple, MD (Cardiology) Pietro Bridegroom, MD (Inactive) (Gastroenterology) Audelia Leaks, MD (Obstetrics and Gynecology) Amedeo Jupiter, MD (Ophthalmology) Frazier, [place], OD (Optometry)  Indicate any recent Medical Services you may have received from other than [facility] providers in the past year (date may be approximate).     Assessment:    This is a routine wellness examination for [place].  Hearing/Vision screen Hearing Screening - Comments:: Denies hearing difficulties   Vision Screening - Comments:: Denies vision concerns - up-to-date with vision exam with [place] Frazier   Goals Addressed               This Visit's Progress     Patient Stated (pt-stated)        Patient stated she plans to monitor her blood sugar readings better.       Depression Screen     11/10/2023   10:21 AM  03/24/2023    9:41 AM 11/09/2022   10:33 AM 10/25/2022    3:14 PM 09/21/2022    9:16 AM 01/05/2022    2:35 PM 11/05/2021   10:09 AM  PHQ 2/9 Scores  PHQ - 2 Score 0 2 0 0 0 2 2  PHQ- 9 Score 2 5 0  0 5 4    Fall Risk     11/10/2023   10:20 AM 11/09/2023    6:49 PM 03/24/2023    9:41 AM 11/09/2022   10:33 AM 09/21/2022    9:16  AM  Fall Risk   Falls in the past year? 0 0 0 0 0  Number falls in past yr: 0  0 0 0  Injury with Fall? 0  0 0 0  Risk for fall due to : No Fall Risks  No Fall Risks    Follow up Falls evaluation completed;Falls prevention discussed  Falls evaluation completed Falls evaluation completed Falls evaluation completed    MEDICARE RISK AT HOME:  Medicare Risk at Home Any stairs in or around the home?: Yes If so, are there any without handrails?: No Home free of loose throw rugs in walkways, pet beds, electrical cords, etc?: Yes Adequate lighting in your home to reduce risk of falls?: Yes Life alert?: No Use of a cane, walker or w/c?: No Grab bars in the bathroom?: No Shower chair or bench in shower?: No Elevated toilet seat or a handicapped toilet?: No  TIMED UP AND GO:  Was the test performed?  No  Cognitive Function: 6CIT completed        11/10/2023   10:24 AM  6CIT Screen  What Year? 0 points  What month? 0 points  What time? 0 points  Count back from 20 0 points  Months in reverse 0 points  Repeat phrase 0 points  Total Score 0 points    Immunizations Immunization History  Administered Date(s) Administered   Fluad Quad(high Dose 65+) 04/05/2019, 05/20/2020, 06/16/2021, 04/08/2022   Influenza, High Dose Seasonal PF 02/27/2018   Influenza,inj,Quad PF,6+ Mos 06/29/2013, 02/25/2017   PFIZER(Purple Top)SARS-COV-2 Vaccination 08/04/2019, 09/21/2019, 05/30/2020   Pfizer Covid-19 Vaccine Bivalent Booster 18yrs & up 03/15/2021   Pneumococcal Conjugate-13 02/27/2018   Pneumococcal Polysaccharide-23 06/14/2012   Tdap 08/27/2014    Screening Tests Health Maintenance  Topic Date Due   Zoster Vaccines- Shingrix (1 of 2) Never done   COVID-19 Vaccine (5 - 2024-25 season) 02/13/2023   Pneumonia Vaccine 5+ Years old (3 of 3 - PCV20 or PCV21) 02/28/2023   MAMMOGRAM  04/22/2023   INFLUENZA VACCINE  01/13/2024   FOOT EXAM  03/23/2024   HEMOGLOBIN A1C  03/24/2024   Diabetic  kidney evaluation - eGFR measurement  08/11/2024   DTaP/Tdap/Td (2 - Td or Tdap) 08/26/2024   Diabetic kidney evaluation - Urine ACR  09/22/2024   OPHTHALMOLOGY EXAM  10/04/2024   Medicare Annual Wellness (AWV)  11/09/2024   Colonoscopy  10/20/2025   DEXA SCAN  Completed   Hepatitis C Screening  Completed   HPV VACCINES  Aged Out   Meningococcal B Vaccine  Aged Out    Health Maintenance  Health Maintenance Due  Topic Date Due   Zoster Vaccines- Shingrix (1 of 2) Never done   COVID-19 Vaccine (5 - 2024-25 season) 02/13/2023   Pneumonia Vaccine 72+ Years old (3 of 3 - PCV20 or PCV21) 02/28/2023   MAMMOGRAM  04/22/2023   Health Maintenance Items Addressed:  Mammogram ordered today  Additional  Screening:  Vision Screening: Recommended annual ophthalmology exams for early detection of glaucoma and other disorders of the eye.  Dental Screening: Recommended annual dental exams for proper oral hygiene  Community Resource Referral / Chronic Care Management: CRR required this visit?  No   CCM required this visit?  No   Plan:    I have personally reviewed and noted the following in the patient's chart:   Medical and social history Use of alcohol, tobacco or illicit drugs  Current medications and supplements including opioid prescriptions. Patient is not currently taking opioid prescriptions. Functional ability and status Nutritional status Physical activity Advanced directives List of other physicians Hospitalizations, surgeries, and ER visits in previous 12 months Vitals Screenings to include cognitive, depression, and falls Referrals and appointments  In addition, I have reviewed and discussed with patient certain preventive protocols, quality metrics, and best practice recommendations. A written personalized care plan for preventive services as well as general preventive health recommendations were provided to patient.   Patria Bookbinder, CMA   11/10/2023   After  Visit Summary: (MyChart) Due to this being a telephonic visit, the after visit summary with patients personalized plan was offered to patient via MyChart   Notes: Nothing significant to report at this time.

## 2023-11-10 NOTE — Patient Instructions (Signed)
 Ms. Omeara , Thank you for taking time out of your busy schedule to complete your Annual Wellness Visit with me. I enjoyed our conversation and look forward to speaking with you again next year. I, as well as your care team,  appreciate your ongoing commitment to your health goals. Please review the following plan we discussed and let me know if I can assist you in the future. Your Game plan/ To Do List    Referrals: If you haven't heard from the office you've been referred to, please reach out to them at the phone provided.  Ordered a Mammogram Follow up Visits: Next Medicare AWV with our clinical staff: 11/19/2024   Have you seen your provider in the last 6 months (3 months if uncontrolled diabetes)? Yes Next Office Visit with your provider: 12/12/2023  Clinician Recommendations:  Aim for 30 minutes of exercise or brisk walking, 6-8 glasses of water, and 5 servings of fruits and vegetables each day. Educated and advised on getting the COVID, Shingles, and Pneumonia vaccines in 2025.      This is a list of the screening recommended for you and due dates:  Health Maintenance  Topic Date Due   Zoster (Shingles) Vaccine (1 of 2) Never done   COVID-19 Vaccine (5 - 2024-25 season) 02/13/2023   Pneumonia Vaccine (3 of 3 - PCV20 or PCV21) 02/28/2023   Mammogram  04/22/2023   Flu Shot  01/13/2024   Complete foot exam   03/23/2024   Hemoglobin A1C  03/24/2024   Yearly kidney function blood test for diabetes  08/11/2024   DTaP/Tdap/Td vaccine (2 - Td or Tdap) 08/26/2024   Yearly kidney health urinalysis for diabetes  09/22/2024   Eye exam for diabetics  10/04/2024   Medicare Annual Wellness Visit  11/09/2024   Colon Cancer Screening  10/20/2025   DEXA scan (bone density measurement)  Completed   Hepatitis C Screening  Completed   HPV Vaccine  Aged Out   Meningitis B Vaccine  Aged Out    Advanced directives: (Copy Requested) Please bring a copy of your health care power of attorney and living  will to the office to be added to your chart at your convenience. You can mail to [facility] [address] [place], [place] [address] or email to [email or web address] Advance Care Planning is important because it:  [x]  Makes sure you receive the medical care that is consistent with your values, goals, and preferences  [x]  It provides guidance to your family and loved ones and reduces their decisional burden about whether or not they are making the right decisions based on your wishes.  Follow the link provided in your after visit summary or read over the paperwork we have mailed to you to help you started getting your Advance Directives in place. If you need assistance in completing these, please reach out to us  so that we can help you!

## 2023-11-11 ENCOUNTER — Encounter: Payer: Self-pay | Admitting: Internal Medicine

## 2023-11-11 ENCOUNTER — Ambulatory Visit: Admitting: Internal Medicine

## 2023-11-11 ENCOUNTER — Other Ambulatory Visit (HOSPITAL_COMMUNITY): Payer: Self-pay

## 2023-11-11 ENCOUNTER — Telehealth: Payer: Self-pay

## 2023-11-11 VITALS — BP 140/80 | HR 115 | Temp 97.8°F | Ht 64.0 in | Wt 175.0 lb

## 2023-11-11 DIAGNOSIS — Z0000 Encounter for general adult medical examination without abnormal findings: Secondary | ICD-10-CM

## 2023-11-11 DIAGNOSIS — E118 Type 2 diabetes mellitus with unspecified complications: Secondary | ICD-10-CM | POA: Diagnosis not present

## 2023-11-11 DIAGNOSIS — I4729 Other ventricular tachycardia: Secondary | ICD-10-CM

## 2023-11-11 DIAGNOSIS — E1169 Type 2 diabetes mellitus with other specified complication: Secondary | ICD-10-CM

## 2023-11-11 DIAGNOSIS — E785 Hyperlipidemia, unspecified: Secondary | ICD-10-CM

## 2023-11-11 LAB — COMPREHENSIVE METABOLIC PANEL WITH GFR
ALT: 16 U/L (ref 0–35)
AST: 21 U/L (ref 0–37)
Albumin: 4.6 g/dL (ref 3.5–5.2)
Alkaline Phosphatase: 53 U/L (ref 39–117)
BUN: 25 mg/dL — ABNORMAL HIGH (ref 6–23)
CO2: 30 meq/L (ref 19–32)
Calcium: 9.8 mg/dL (ref 8.4–10.5)
Chloride: 101 meq/L (ref 96–112)
Creatinine, Ser: 0.79 mg/dL (ref 0.40–1.20)
GFR: 75.45 mL/min (ref >60.00)
Glucose, Bld: 107 mg/dL — ABNORMAL HIGH (ref 70–99)
Potassium: 4.3 meq/L (ref 3.5–5.1)
Sodium: 139 meq/L (ref 135–145)
Total Bilirubin: 0.5 mg/dL (ref 0.2–1.2)
Total Protein: 7.6 g/dL (ref 6.0–8.3)

## 2023-11-11 LAB — LIPID PANEL
Cholesterol: 219 mg/dL — ABNORMAL HIGH (ref 0–200)
HDL: 82.8 mg/dL (ref >39.00)
LDL Cholesterol: 119 mg/dL — ABNORMAL HIGH (ref 0–99)
NonHDL: 136.01
Total CHOL/HDL Ratio: 3
Triglycerides: 83 mg/dL (ref 0.0–149.0)
VLDL: 16.6 mg/dL (ref 0.0–40.0)

## 2023-11-11 LAB — CBC
HCT: 43.1 % (ref 36.0–46.0)
Hemoglobin: 14.4 g/dL (ref 12.0–15.0)
MCHC: 33.3 g/dL (ref 30.0–36.0)
MCV: 86 fl (ref 78.0–100.0)
Platelets: 226 10*3/uL (ref 150.0–400.0)
RBC: 5.01 Mil/uL (ref 3.87–5.11)
RDW: 14.6 % (ref 11.5–15.5)
WBC: 5.6 10*3/uL (ref 4.0–10.5)

## 2023-11-11 MED ORDER — TIRZEPATIDE 2.5 MG/0.5ML ~~LOC~~ SOAJ: 2.5000 mg | SUBCUTANEOUS | 0 refills | Status: DC

## 2023-11-11 MED ORDER — ALPRAZOLAM 0.25 MG PO TABS: 0.2500 mg | ORAL_TABLET | Freq: Every day | ORAL | 0 refills | Status: DC | PRN

## 2023-11-11 MED ORDER — TIRZEPATIDE 7.5 MG/0.5ML ~~LOC~~ SOAJ: 7.5000 mg | SUBCUTANEOUS | 0 refills | Status: AC

## 2023-11-11 MED ORDER — TIRZEPATIDE 5 MG/0.5ML ~~LOC~~ SOAJ: 5.0000 mg | SUBCUTANEOUS | 0 refills | Status: DC

## 2023-11-11 NOTE — Assessment & Plan Note (Signed)
 Takes propranolol  only prn and no episodes recently.

## 2023-11-11 NOTE — Telephone Encounter (Signed)
 Pharmacy Patient Advocate Encounter  Received notification from [employer] that Prior Authorization for Mounjaro  5 has been APPROVED from 11/11/23 to 11/10/24. Ran test claim, Copay is $117.50. This test claim was processed through [facility]- copay amounts may vary at other pharmacies due to pharmacy/plan contracts, or as the patient moves through the different stages of their insurance plan.   PA #/Case ID/Reference #: [id number]

## 2023-11-11 NOTE — Assessment & Plan Note (Signed)
 Sugars trending up some even with dietary changes. Rx mounjaro first 3 months then needs follow up. Using CGM monitoring which has helped her change diet.

## 2023-11-11 NOTE — Assessment & Plan Note (Signed)
 Flu shot yearly. Pneumonia counseled. Shingrix counseled. Tetanus counseled. Colonoscopy up to date. Mammogram up to date, pap smear aged out and dexa up to date. Counseled about sun safety and mole surveillance. Counseled about the dangers of distracted driving. Given 10 year screening recommendations.

## 2023-11-11 NOTE — Telephone Encounter (Signed)
 Pharmacy Patient Advocate Encounter   Received notification from Pt Calls Messages that prior authorization for Mounjaro  5 is required/requested.   Insurance verification completed.   The patient is insured through [facility] [employer]/RX ADV[employer] .   Per test claim: PA required; PA submitted to above mentioned insurance via CoverMyMeds Key/confirmation #/EOC [id number] Status is pending

## 2023-11-11 NOTE — Assessment & Plan Note (Signed)
 Checking lipid panel but did stop medications for some time and then resumed daily a few weeks ago.

## 2023-11-11 NOTE — Progress Notes (Signed)
   Subjective:   Patient ID: Jean Smith, female    DOB: Nov 09, 1950, 73 y.o.   MRN: [id number]  HPI The patient is here for physical.  PMH, [facility], social history reviewed and updated  Review of Systems  Constitutional: Negative.   HENT: Negative.    Eyes: Negative.   Respiratory:  Negative for cough, chest tightness and shortness of breath.   Cardiovascular:  Negative for chest pain, palpitations and leg swelling.  Gastrointestinal:  Negative for abdominal distention, abdominal pain, constipation, diarrhea, nausea and vomiting.  Musculoskeletal:  Positive for arthralgias.  Skin: Negative.   Neurological: Negative.   Psychiatric/Behavioral: Negative.      Objective:  Physical Exam Constitutional:      Appearance: She is well-developed.  HENT:     Head: Normocephalic and atraumatic.  Cardiovascular:     Rate and Rhythm: Normal rate and regular rhythm.  Pulmonary:     Effort: Pulmonary effort is normal. No respiratory distress.     Breath sounds: Normal breath sounds. No wheezing or rales.  Abdominal:     General: Bowel sounds are normal. There is no distension.     Palpations: Abdomen is soft.     Tenderness: There is no abdominal tenderness. There is no rebound.  Musculoskeletal:     Cervical back: Normal range of motion.  Skin:    General: Skin is warm and dry.  Neurological:     Mental Status: She is alert and oriented to person, place, and time.     Coordination: Coordination normal.     Vitals:   11/11/23 1110 11/11/23 1118  BP: (!) 140/80 (!) 140/80  Pulse: (!) 115   Temp: 97.8 F (36.6 C)   TempSrc: Oral   SpO2: 95%   Weight: 175 lb (79.4 kg)   Height: 5\' 4"  (1.626 m)     Assessment & Plan:

## 2023-11-11 NOTE — Patient Instructions (Signed)
 We have sent in mounjaro to start with 2.5 mg weekly for month 1, then 5 mg weekly for month 2 then 7.5 mg weekly for month 3 then follow up with us  or the endocrinologist.

## 2023-11-14 ENCOUNTER — Ambulatory Visit: Payer: Self-pay | Admitting: Internal Medicine

## 2023-11-18 ENCOUNTER — Ambulatory Visit

## 2023-11-18 DIAGNOSIS — E118 Type 2 diabetes mellitus with unspecified complications: Secondary | ICD-10-CM

## 2023-11-18 NOTE — Progress Notes (Signed)
 Educated pt on how to inject 1st Mounjaro  injection. First injection given w/o complications

## 2023-11-22 ENCOUNTER — Ambulatory Visit (HOSPITAL_BASED_OUTPATIENT_CLINIC_OR_DEPARTMENT_OTHER): Admitting: Radiology

## 2023-11-23 DIAGNOSIS — Z85828 Personal history of other malignant neoplasm of skin: Secondary | ICD-10-CM | POA: Diagnosis not present

## 2023-11-23 DIAGNOSIS — D225 Melanocytic nevi of trunk: Secondary | ICD-10-CM | POA: Diagnosis not present

## 2023-11-23 DIAGNOSIS — D2271 Melanocytic nevi of right lower limb, including hip: Secondary | ICD-10-CM | POA: Diagnosis not present

## 2023-11-23 DIAGNOSIS — L821 Other seborrheic keratosis: Secondary | ICD-10-CM | POA: Diagnosis not present

## 2023-11-23 DIAGNOSIS — D224 Melanocytic nevi of scalp and neck: Secondary | ICD-10-CM | POA: Diagnosis not present

## 2023-11-23 DIAGNOSIS — L723 Sebaceous cyst: Secondary | ICD-10-CM | POA: Diagnosis not present

## 2023-11-25 ENCOUNTER — Ambulatory Visit (INDEPENDENT_AMBULATORY_CARE_PROVIDER_SITE_OTHER)

## 2023-11-25 DIAGNOSIS — E118 Type 2 diabetes mellitus with unspecified complications: Secondary | ICD-10-CM

## 2023-11-25 NOTE — Progress Notes (Signed)
 Patient visits today for continued patient education on administering her mounjaro . Patient expressed understanding and I informed her to reach out if needed

## 2023-11-29 ENCOUNTER — Ambulatory Visit (HOSPITAL_BASED_OUTPATIENT_CLINIC_OR_DEPARTMENT_OTHER): Admitting: Radiology

## 2023-12-07 DIAGNOSIS — Z1231 Encounter for screening mammogram for malignant neoplasm of breast: Secondary | ICD-10-CM | POA: Diagnosis not present

## 2023-12-12 ENCOUNTER — Ambulatory Visit: Payer: Self-pay

## 2023-12-12 NOTE — Telephone Encounter (Signed)
 FYI Only or Action Required?: Action required by provider: clinical question for provider.  Patient was last seen in primary care on 11/11/2023 by Rollene Almarie LABOR, MD. Called Nurse Triage reporting Medication Problem. Symptoms began several days ago. Interventions attempted: Nothing. Symptoms are: stable.  Triage Disposition: Call PCP When Office is Open  Patient/caregiver understands and will follow disposition?: Yes  **Please see note below**                Copied from CRM [id number]. Topic: Clinical - Red Word Triage >> Dec 12, 2023  1:36 PM Corin V wrote: [facility] that prompted transfer to Nurse Triage: Patient just took her 4th shot of the tirzepatide  (MOUNJARO ) 2.5 MG/0.5ML on Friday. She has some nausea and headaches the 2 days following the injection but no other side effects or issues. She is down 5 pounds. She wants to know if she can stay on the 2.5 mg dose for another month before moving to the 5mg  dose. Reason for Disposition  [1] Caller has NON-URGENT medicine question about med that PCP prescribed AND [2] triager unable to answer question  Answer Assessment - Initial Assessment Questions 1. NAME of MEDICINE: What medicine(s) are you calling about?    tirzepatide  (MOUNJARO )   2. QUESTION: What is your question? (e.g., double dose of medicine, side effect)  Patient wants to know if she can stay on the 2.5 mg dose for another month before moving to the 5mg  dose.        3. PRESCRIBER: Who prescribed the medicine? Reason: if prescribed by specialist, call should be referred to that group.    Dr. Rollene  4. SYMPTOMS: Do you have any symptoms? If Yes, ask: What symptoms are you having?  How bad are the symptoms (e.g., mild, moderate, severe)     She has taken her 4th injection Friday, she is experiencing some mild nausea, and headache. Patient advised those are common side effects of the medication, and home care advice given.  Protocols used:  Medication Question Call-A-AH

## 2023-12-12 NOTE — Progress Notes (Unsigned)
 [facility] [address] [place] [address] Phone: [phone] Subjective:   ISusannah Gully, am serving as a scribe for Dr. Arthea Claudene.  I'm seeing this patient by the request  of:  Rollene Almarie LABOR, MD  CC: Knee pain follow-up  YEP:Dlagzrupcz  08/10/2023 Pt for shoulder  Pt for balance and coordination     Injection given. Discussed icing regimen  Discussed bracing. Increase activity noted. RTC in 6-8 weeks       Update 12/13/2023 MCKENZYE CUTRIGHT is a 73 y.o. female coming in with complaint of R knee and B shoulder pain. Patient states shoulders are doing okay. PT overall did help. Knee pain is main priority. When laying on L side R knee will lock.    Past Medical History:  Diagnosis Date   Allergy    Anxiety    Aortic regurgitation    moderate by echo 10/2020   Arthritis    Cancer (HCC)    skin   Cataract    bilateral - MD is just watching    Diverticulosis    Fuchs' endothelial dystrophy    follows with optho regularly    Gestational diabetes    Heart murmur    MVP    History of depression    HSV-2 infection    Hyperlipidemia    Hyperplastic colon polyp    Mitral valve prolapse    mild to moderate MR by echo 10/2020   PVC's (premature ventricular contractions)    intol of BB, sxc palpitations r/t stress   RVOT ventricular tachycardia/PVCs    EP eval 01/2013 for freq PVCs   Past Surgical History:  Procedure Laterality Date   CESAREAN SECTION     x 3   COLONOSCOPY     KNEE SURGERY Right    MANDIBLE SURGERY     right side in front of ear   MOHS SURGERY  2021   POLYPECTOMY     WISDOM TOOTH EXTRACTION     Social History   Socioeconomic History   Marital status: Married    Spouse name: Not on file   Number of children: 3   Years of education: Not on file   Highest education level: Some college, no degree  Occupational History   Not on file  Tobacco Use   Smoking status: Former    Current packs/day: 0.00     Average packs/day: 0.3 packs/day for 5.0 years (1.3 ttl pk-yrs)    Types: Cigarettes    Quit date: 06/15/1983    Years since quitting: 40.5    Passive exposure: Past   Smokeless tobacco: Never   Tobacco comments:    quit January 1983 42 years ago  less than pk/week  Vaping Use   Vaping status: Never Used  Substance and Sexual Activity   Alcohol use: Not Currently    Comment: occasional   Drug use: No   Sexual activity: Not Currently    Birth control/protection: Post-menopausal  Other Topics Concern   Not on file  Social History Narrative   Regular exercise: yes   Caffeine use: none   Social Drivers of Corporate investment banker Strain: Low Risk  (11/10/2023)   Overall Financial Resource Strain (CARDIA)    Difficulty of Paying Living Expenses: Not hard at all  Food Insecurity: No Food Insecurity (11/10/2023)   Hunger Vital Sign    Worried About Running Out of Food in the Last Year: Never true  Ran Out of Food in the Last Year: Never true  Transportation Needs: No Transportation Needs (11/10/2023)   PRAPARE - Administrator, Civil Service (Medical): No    Lack of Transportation (Non-Medical): No  Physical Activity: Insufficiently Active (11/10/2023)   Exercise Vital Sign    Days of Exercise per Week: 2 days    Minutes of Exercise per Session: 30 min  Stress: No Stress Concern Present (11/10/2023)   [employer] of Occupational Health - Occupational Stress Questionnaire    Feeling of Stress : Only a little  Social Connections: Moderately Integrated (11/10/2023)   Social Connection and Isolation Panel    Frequency of Communication with Friends and Family: More than three times a week    Frequency of Social Gatherings with Friends and Family: Once a week    Attends Religious Services: Never    Database administrator or Organizations: Yes    Attends Engineer, structural: 1 to 4 times per year    Marital Status: Married   Allergies  Allergen  Reactions   Latex Swelling   Other     Grass, local trees, cats and dogs   Pollen Extract    Family History  Problem Relation Age of Onset   Colon cancer Mother        dx'd in her 19's-- stage 2    Cancer Mother    Miscarriages / Stillbirths Mother    Hypertension Father    Heart disease Father    Hyperlipidemia Father    COPD Father    Thyroid  disease Sister    Colon polyps Sister    Colon polyps Sister    Colon cancer Maternal Grandmother    Heart disease Maternal Grandmother    Colon polyps Daughter    Alcohol abuse Other    Rectal cancer Neg Hx    Stomach cancer Neg Hx    Esophageal cancer Neg Hx     Current Outpatient Medications (Endocrine & Metabolic):    tirzepatide  (MOUNJARO ) 2.5 MG/0.5ML Pen, Inject 2.5 mg into the skin once a week.   tirzepatide  (MOUNJARO ) 5 MG/0.5ML Pen, Inject 5 mg into the skin once a week.   tirzepatide  (MOUNJARO ) 7.5 MG/0.5ML Pen, Inject 7.5 mg into the skin once a week.  Current Outpatient Medications (Cardiovascular):    propranolol  (INDERAL ) 10 MG tablet, Take 10 mg by mouth as needed (increased HR and skipped beats). (Patient not taking: Reported on 11/11/2023)   rosuvastatin  (CRESTOR ) 5 MG tablet, Take 1 tablet (5 mg total) by mouth daily.  Current Outpatient Medications (Respiratory):    loratadine (CLARITIN) 10 MG tablet, Take 10 mg by mouth daily as needed for allergies.    Current Outpatient Medications (Other):    ALPRAZolam  (XANAX ) 0.25 MG tablet, Take 1 tablet (0.25 mg total) by mouth daily as needed for anxiety.   ascorbic acid (VITAMIN C) 500 MG tablet, Take 500 mg by mouth every 3 (three) days. [phone] MG (Patient not taking: Reported on 11/11/2023)   b complex vitamins capsule, Take 1 capsule by mouth every 3 (three) days.   Cholecalciferol (VITAMIN D -3) 125 MCG (5000 UT) TABS, Take 1 tablet by mouth every 3 (three) days.   Continuous Glucose Sensor (FREESTYLE LIBRE 3 PLUS SENSOR) MISC, 1 each by Does not apply route every  14 (fourteen) days.   MAGNESIUM GLUCONATE PO, Take 400 mg by mouth daily.   Reviewed prior external information including notes and imaging from  primary care provider As well  as notes that were available from care everywhere and other healthcare systems.  Past medical history, social, surgical and family history all reviewed in electronic medical record.  No pertanent information unless stated regarding to the chief complaint.   Review of Systems:  No headache, visual changes, nausea, vomiting, diarrhea, constipation, dizziness, abdominal pain, skin rash, fevers, chills, night sweats, weight loss, swollen lymph nodes, body aches, joint swelling, chest pain, shortness of breath, mood changes. POSITIVE muscle aches  Objective  Blood pressure 122/68, pulse (!) 105, height 5' 4 (1.626 m), weight 173 lb (78.5 kg), SpO2 95%.   General: No apparent distress alert and oriented x3 mood and affect normal, dressed appropriately.  HEENT: Pupils equal, extraocular movements intact  Respiratory: Patient's speak in full sentences and does not appear short of breath  Cardiovascular: No lower extremity edema, non tender, no erythema  Right knee does have a trace effusion noted.  Tenderness to palpation noted.  Discussed crepitus.  After informed written and verbal consent, patient was seated on exam table. Right knee was prepped with alcohol swab and utilizing anterolateral approach, patient's right knee space was injected with 4:1  marcaine 0.5%: Kenalog 40mg /dL. Patient tolerated the procedure well without immediate complications.    Impression and Recommendations:    The above documentation has been reviewed and is accurate and complete Quentavious Rittenhouse M Davine Sweney, DO

## 2023-12-13 ENCOUNTER — Ambulatory Visit (INDEPENDENT_AMBULATORY_CARE_PROVIDER_SITE_OTHER)

## 2023-12-13 ENCOUNTER — Other Ambulatory Visit: Payer: Self-pay | Admitting: Internal Medicine

## 2023-12-13 ENCOUNTER — Ambulatory Visit: Admitting: Family Medicine

## 2023-12-13 ENCOUNTER — Encounter: Payer: Self-pay | Admitting: Family Medicine

## 2023-12-13 VITALS — BP 122/68 | HR 105 | Ht 64.0 in | Wt 173.0 lb

## 2023-12-13 DIAGNOSIS — M1711 Unilateral primary osteoarthritis, right knee: Secondary | ICD-10-CM | POA: Diagnosis not present

## 2023-12-13 DIAGNOSIS — M25561 Pain in right knee: Secondary | ICD-10-CM | POA: Diagnosis not present

## 2023-12-13 DIAGNOSIS — G8929 Other chronic pain: Secondary | ICD-10-CM

## 2023-12-13 MED ORDER — TIRZEPATIDE 2.5 MG/0.5ML ~~LOC~~ SOAJ: 2.5000 mg | SUBCUTANEOUS | 1 refills | Status: AC

## 2023-12-13 NOTE — Telephone Encounter (Signed)
 Ok to stay on 2.5 mg and refill sent in

## 2023-12-13 NOTE — Assessment & Plan Note (Addendum)
 An exacerbation noted.  Discussed icing regimen and home exercises, discussed which activities to do and which ones to avoid.  Increase activity slowly.  Known lateral compartment arthritis.  X-rays ordered today and independently visualized by me showing some progression of the sclerotic changes of the lateral compartment.  Patient could be a potential candidate for viscosupplementation.  Follow-up with me again in 2 to 3 months.

## 2023-12-13 NOTE — Telephone Encounter (Signed)
 Please advise patient is having side affects

## 2023-12-13 NOTE — Patient Instructions (Signed)
 Injection in knee today Write us  in 2-3 weeks if not better we can get MRI You have 14 days to return or exchange your brace Call [phone], then return the brace to our office See you again in 3 months

## 2023-12-13 NOTE — Telephone Encounter (Signed)
 effects

## 2023-12-14 ENCOUNTER — Ambulatory Visit: Payer: Self-pay | Admitting: Family Medicine

## 2024-01-01 ENCOUNTER — Other Ambulatory Visit: Payer: Self-pay | Admitting: Internal Medicine

## 2024-01-04 ENCOUNTER — Encounter: Payer: Self-pay | Admitting: Family Medicine

## 2024-02-20 DIAGNOSIS — H0288B Meibomian gland dysfunction left eye, upper and lower eyelids: Secondary | ICD-10-CM | POA: Diagnosis not present

## 2024-02-20 DIAGNOSIS — H00022 Hordeolum internum right lower eyelid: Secondary | ICD-10-CM | POA: Diagnosis not present

## 2024-02-20 DIAGNOSIS — H0288A Meibomian gland dysfunction right eye, upper and lower eyelids: Secondary | ICD-10-CM | POA: Diagnosis not present

## 2024-02-20 LAB — HM DIABETES EYE EXAM

## 2024-02-21 ENCOUNTER — Encounter: Payer: Self-pay | Admitting: Internal Medicine

## 2024-03-13 ENCOUNTER — Encounter (HOSPITAL_BASED_OUTPATIENT_CLINIC_OR_DEPARTMENT_OTHER): Payer: Self-pay | Admitting: Obstetrics & Gynecology

## 2024-03-13 NOTE — Progress Notes (Unsigned)
 [facility] [address] [place] [address] Phone: [phone] Subjective:   Jean Smith, am serving as a scribe for Dr. Arthea Claudene.  I'm seeing this patient by the request  of:  Rollene Almarie LABOR, MD  CC: Right knee pain  YEP:Dlagzrupcz  12/13/2023 An exacerbation noted.  Discussed icing regimen and home exercises, discussed which activities to do and which ones to avoid.  Increase activity slowly.  Known lateral compartment arthritis.  X-rays ordered today and independently visualized by me showing some progression of the sclerotic changes of the lateral compartment.  Patient could be a potential candidate for viscosupplementation.  Follow-up with me again in 2 to 3 months.      Update 03/14/2024 Jean Smith is a 73 y.o. female coming in with complaint of R knee pain.  Known arthritis of the knee previously.  Last injection was in July of this year.  Patient states that her knee is doing better after the injection. Does still have pain with stair climbing. Knee will lock especially when she lies on her L side.   C/o L shoulder pain. Having hard time driving, reaching for milk putting on clothes. Pain in bicep. Seen for [facility] jt arthritis in 2023.     Past Medical History:  Diagnosis Date   Allergy    Anxiety    Aortic regurgitation    moderate by echo 10/2020   Arthritis    Cancer (HCC)    skin   Cataract    bilateral - MD is just watching    Diverticulosis    Fuchs' endothelial dystrophy    follows with optho regularly    Gestational diabetes    Heart murmur    MVP    History of depression    HSV-2 infection    Hyperlipidemia    Hyperplastic colon polyp    Mitral valve prolapse    mild to moderate MR by echo 10/2020   PVC's (premature ventricular contractions)    intol of BB, sxc palpitations r/t stress   RVOT ventricular tachycardia/PVCs    EP eval 01/2013 for freq PVCs   Past Surgical History:  Procedure Laterality  Date   CESAREAN SECTION     x 3   COLONOSCOPY     KNEE SURGERY Right    MANDIBLE SURGERY     right side in front of ear   MOHS SURGERY  2021   POLYPECTOMY     WISDOM TOOTH EXTRACTION     Social History   Socioeconomic History   Marital status: Married    Spouse name: Not on file   Number of children: 3   Years of education: Not on file   Highest education level: Some college, no degree  Occupational History   Not on file  Tobacco Use   Smoking status: Former    Current packs/day: 0.00    Average packs/day: 0.3 packs/day for 5.0 years (1.3 ttl pk-yrs)    Types: Cigarettes    Quit date: 06/15/1983    Years since quitting: 40.7    Passive exposure: Past   Smokeless tobacco: Never   Tobacco comments:    quit January 1983 42 years ago  less than pk/week  Vaping Use   Vaping status: Never Used  Substance and Sexual Activity   Alcohol use: Not Currently    Comment: occasional   Drug use: No   Sexual activity: Not Currently    Birth control/protection: Post-menopausal  Other  Topics Concern   Not on file  Social History Narrative   Regular exercise: yes   Caffeine use: none   Social Drivers of Corporate investment banker Strain: Low Risk  (11/10/2023)   Overall Financial Resource Strain (CARDIA)    Difficulty of Paying Living Expenses: Not hard at all  Food Insecurity: No Food Insecurity (11/10/2023)   Hunger Vital Sign    Worried About Running Out of Food in the Last Year: Never true    Ran Out of Food in the Last Year: Never true  Transportation Needs: No Transportation Needs (11/10/2023)   PRAPARE - Administrator, Civil Service (Medical): No    Lack of Transportation (Non-Medical): No  Physical Activity: Insufficiently Active (11/10/2023)   Exercise Vital Sign    Days of Exercise per Week: 2 days    Minutes of Exercise per Session: 30 min  Stress: No Stress Concern Present (11/10/2023)   [employer] of Occupational Health - Occupational Stress  Questionnaire    Feeling of Stress : Only a little  Social Connections: Moderately Integrated (11/10/2023)   Social Connection and Isolation Panel    Frequency of Communication with Friends and Family: More than three times a week    Frequency of Social Gatherings with Friends and Family: Once a week    Attends Religious Services: Never    Database administrator or Organizations: Yes    Attends Engineer, structural: 1 to 4 times per year    Marital Status: Married   Allergies  Allergen Reactions   Latex Swelling   Other     Grass, local trees, cats and dogs   Pollen Extract    Family History  Problem Relation Age of Onset   Colon cancer Mother        dx'd in her 27's-- stage 2    Cancer Mother    Miscarriages / [place] Mother    Hypertension Father    Heart disease Father    Hyperlipidemia Father    COPD Father    Thyroid  disease Sister    Colon polyps Sister    Colon polyps Sister    Colon cancer Maternal Grandmother    Heart disease Maternal Grandmother    Colon polyps Daughter    Alcohol abuse Other    Rectal cancer Neg Hx    Stomach cancer Neg Hx    Esophageal cancer Neg Hx     Current Outpatient Medications (Endocrine & Metabolic):    tirzepatide  (MOUNJARO ) 2.5 MG/0.5ML Pen, Inject 2.5 mg into the skin once a week.   tirzepatide  (MOUNJARO ) 5 MG/0.5ML Pen, Inject 5 mg into the skin once a week.   tirzepatide  (MOUNJARO ) 7.5 MG/0.5ML Pen, Inject 7.5 mg into the skin once a week.  Current Outpatient Medications (Cardiovascular):    propranolol  (INDERAL ) 10 MG tablet, Take 10 mg by mouth as needed (increased HR and skipped beats). (Patient not taking: Reported on 11/11/2023)   rosuvastatin  (CRESTOR ) 5 MG tablet, TAKE 1 TABLET (5 MG TOTAL) BY MOUTH DAILY.  Current Outpatient Medications (Respiratory):    loratadine (CLARITIN) 10 MG tablet, Take 10 mg by mouth daily as needed for allergies.    Current Outpatient Medications (Other):    ALPRAZolam   (XANAX ) 0.25 MG tablet, Take 1 tablet (0.25 mg total) by mouth daily as needed for anxiety.   ascorbic acid (VITAMIN C) 500 MG tablet, Take 500 mg by mouth every 3 (three) days. [id number] MG (Patient not taking: Reported on  11/11/2023)   b complex vitamins capsule, Take 1 capsule by mouth every 3 (three) days.   Cholecalciferol (VITAMIN D -3) 125 MCG (5000 UT) TABS, Take 1 tablet by mouth every 3 (three) days.   Continuous Glucose Sensor (FREESTYLE LIBRE 3 PLUS SENSOR) MISC, 1 each by Does not apply route every 14 (fourteen) days.   MAGNESIUM GLUCONATE PO, Take 400 mg by mouth daily.   Reviewed prior external information including notes and imaging from  primary care provider As well as notes that were available from care everywhere and other healthcare systems.  Past medical history, social, surgical and family history all reviewed in electronic medical record.  No pertanent information unless stated regarding to the chief complaint.   Review of Systems:  No headache, visual changes, nausea, vomiting, diarrhea, constipation, dizziness, abdominal pain, skin rash, fevers, chills, night sweats, weight loss, swollen lymph nodes, body aches, joint swelling, chest pain, shortness of breath, mood changes. POSITIVE muscle aches  Objective  There were no vitals taken for this visit.   General: No apparent distress alert and oriented x3 mood and affect normal, dressed appropriately.  HEENT: Pupils equal, extraocular movements intact  Respiratory: Patient's speak in full sentences and does not appear short of breath  Cardiovascular: No lower extremity edema, non tender, no erythema  Knee exam shows patient does have arthritic changes still noted of the joint space.  Seems to be minorly tender but improved from previous exam.  Left shoulder does have weakness noted on exam today.  Difficulty with impingement with Neer and Hawking's.  Weakness noted with 3 out of 5 strength of the rotator cuff.  Some  limited range of motion otherwise.  Limited muscular skeletal ultrasound was performed and interpreted by CLAUDENE HUSSAR, M  Limited ultrasound shows hypoechoic changes in the bicep tendon noted. Patient also has what appears to be a fairly large high-grade tear of the supraspinatus with 0.48 cm of retraction noted. 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  Procedure: Real-time Ultrasound Guided Injection of left glenohumeral joint Device: GE Logiq E  Ultrasound guided injection is preferred based studies that show increased duration, increased effect, greater accuracy, decreased procedural pain, increased response rate with ultrasound guided versus blind injection.  Verbal informed consent obtained.  Time-out conducted.  Noted no overlying erythema, induration, or other signs of local infection.  Skin prepped in a sterile fashion.  Local anesthesia: Topical Ethyl chloride.  With sterile technique and under real time ultrasound guidance:  Joint visualized.  21g 2 inch needle inserted posterior approach. Pictures taken for needle placement. Patient did have injection of 2 cc of 0.5% Marcaine, and 1cc of Kenalog 40 mg/dL. Completed without difficulty  Pain immediately resolved suggesting accurate placement of the medication.  Advised to call if fevers/chills, erythema, induration, drainage, or persistent bleeding.  Images permanently stored and available for review in the ultrasound unit.  Impression: Technically successful ultrasound guided injection.   Impression and Recommendations:     The above documentation has been reviewed and is accurate and complete Laveyah Oriol M Osei Anger, DO

## 2024-03-14 ENCOUNTER — Encounter: Payer: Self-pay | Admitting: Family Medicine

## 2024-03-14 ENCOUNTER — Ambulatory Visit: Admitting: Family Medicine

## 2024-03-14 ENCOUNTER — Other Ambulatory Visit: Payer: Self-pay

## 2024-03-14 VITALS — BP 124/84 | HR 92 | Ht 64.0 in | Wt 164.0 lb

## 2024-03-14 DIAGNOSIS — M25512 Pain in left shoulder: Secondary | ICD-10-CM

## 2024-03-14 DIAGNOSIS — M75102 Unspecified rotator cuff tear or rupture of left shoulder, not specified as traumatic: Secondary | ICD-10-CM | POA: Insufficient documentation

## 2024-03-14 DIAGNOSIS — M19012 Primary osteoarthritis, left shoulder: Secondary | ICD-10-CM | POA: Diagnosis not present

## 2024-03-14 DIAGNOSIS — M1711 Unilateral primary osteoarthritis, right knee: Secondary | ICD-10-CM | POA: Diagnosis not present

## 2024-03-14 DIAGNOSIS — M75112 Incomplete rotator cuff tear or rupture of left shoulder, not specified as traumatic: Secondary | ICD-10-CM | POA: Diagnosis not present

## 2024-03-14 NOTE — Assessment & Plan Note (Signed)
 Stable after injection and will continue to monitor no changes in management at this time

## 2024-03-14 NOTE — Patient Instructions (Addendum)
 Injected shoulder today Exercises See me again in 3 months

## 2024-03-14 NOTE — Assessment & Plan Note (Signed)
 Known arthritic changes and will continue to monitor.  Not do well with physical therapy we will consider to get out of start with home exercises initially.

## 2024-03-14 NOTE — Assessment & Plan Note (Addendum)
 Nontraumatic.  Noted, discussed with patient that there is some underlying arthritis that is likely also contributing.  Discussed with patient this regimen and home exercises, which activities to be slowly.  Discussed with patient strengthening.  Follow-up with me again in 6 to 8 weeks.  Concern of some underlying arthritic changes and likely could be contributing as well.

## 2024-03-27 ENCOUNTER — Ambulatory Visit: Admitting: Internal Medicine

## 2024-03-27 ENCOUNTER — Encounter: Payer: Self-pay | Admitting: Internal Medicine

## 2024-03-27 VITALS — BP 120/80 | HR 104 | Ht 64.0 in | Wt 161.8 lb

## 2024-03-27 DIAGNOSIS — E1165 Type 2 diabetes mellitus with hyperglycemia: Secondary | ICD-10-CM

## 2024-03-27 DIAGNOSIS — Z7985 Long-term (current) use of injectable non-insulin antidiabetic drugs: Secondary | ICD-10-CM | POA: Diagnosis not present

## 2024-03-27 DIAGNOSIS — E1169 Type 2 diabetes mellitus with other specified complication: Secondary | ICD-10-CM

## 2024-03-27 DIAGNOSIS — E785 Hyperlipidemia, unspecified: Secondary | ICD-10-CM | POA: Diagnosis not present

## 2024-03-27 LAB — POCT GLYCOSYLATED HEMOGLOBIN (HGB A1C): Hemoglobin A1C: 6.1 % — AB (ref 4.0–5.6)

## 2024-03-27 MED ORDER — FREESTYLE LIBRE 3 PLUS SENSOR MISC: 1.0000 | 3 refills | Status: AC

## 2024-03-27 NOTE — Progress Notes (Signed)
 Patient ID: Jean Smith, female   DOB: [birth year], 73 y.o.   MRN: [id number]  HPI: Jean Smith is a 73 y.o.-year-old female, initially referred by Dr. Darlyn Sharps, returning for follow-up for DM2, dx in 03/2023, non-insulin-dependent, without long-term complications.  I previously saw the patient >10 years ago for fatigue, palpitations, and dizziness with essentially negative workup.  Our last visit was 6 months ago.  Interim history: No increased urination, blurry vision, nausea, chest pain.  She does have some constipation while on Mounjaro  but fiber Gummies help. She lost 12 pounds before last visit and 15 lbs since then.  She continues on Mounjaro  - started 11/2023.  She stopped eating bread, but eats yoghurt, eggs, meat, fruit.  He is not pizza.  Drinks more water.  Reviewed history: Patient has a history of GDM, then developed prediabetes.  She was diagnosed with diabetes in 03/2023.  She mentions that this diagnosis came after several years of significant stress.  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  Reviewed HbA1c: Lab Results  Component Value Date   HGBA1C 6.7 (A) 09/23/2023   HGBA1C 6.4 (A) 06/09/2023   HGBA1C 6.5 03/24/2023   HGBA1C 6.5 11/09/2022   HGBA1C 6.4 11/05/2021   HGBA1C 6.4 (A) 02/25/2021   HGBA1C 6.4 08/25/2020   HGBA1C 6.3 05/20/2020   HGBA1C 6.4 01/02/2018   HGBA1C 6.3 09/01/2015   Pt is on: - Mounjaro  5 mg weekly  She is on the freestyle libre CGM - with receiver:  Previously:   Lowest: 60 Highest: 250  Glucometer: none  Pt's meals are: - Breakfast: 2 eggs or oats with blueberries - Lunch: Soup or vegetables or [place] - Dinner: Spaghetti with chicken or salmon, salad - Snacks: 2-3: Apple, almond butter, chocolate She is exercising - walking, Pilates, strength training, stretching, and yoga 1-2 times a week. Has a a stationary bike at  home.  - no CKD, last BUN/creatinine:  Lab Results  Component Value Date   BUN 25 (H) 11/11/2023   BUN 23 08/12/2023   CREATININE 0.79 11/11/2023   CREATININE 0.78 08/12/2023   09/23/2023: ACR undetectably low - per [facility] No components found for: MICRALCREAT  -+ HL; last set of lipids: Lab Results  Component Value Date   CHOL 219 (H) 11/11/2023   HDL 82.80 11/11/2023   LDLCALC 119 (H) 11/11/2023   LDLDIRECT 168.0 01/02/2018   TRIG 83.0 11/11/2023   CHOLHDL 3 11/11/2023  He is Crestor  5 mg daily.  - last eye exam was 02/20/2024: No DR. Marlon). Going next week.  - no numbness and tingling in her feet. She sees ortho.  Last foot exam 03/24/2023.  Pt has FH of DM in uncle, aunt - alcoholism.  I reviewed my previous note from 2014 when she presented with fatigue, palpitations, and dizziness: Patient mentioned that she had anxiety (GAD) but felt well up until Spring of 2014 when she developed dizziness that started during her daily walks in 11/2012 >> saw Dr Alveta >> NSVT on Holter monitor >> started beta blocker (Propranolol  q 6 h) >> nausea, SOB, numbness, dysphagia >> had to go to the ED >> thought to be 2/2 anxiety (is on benzodiazepines) >> event monitor in 01/2013 >> ED again: PVCs (despite Xanax  and Propranolol ). She decided to see endocrinology.  At the time of the 2014 visit, she c/o: - + weight  gain > 10 lbs - + flushing >> 5HIAA urinary - normal, cathecolamines reportedly normal - gets red in the face and on neck - ? If they are Hot flushes as she never had them when she actually went through menopause 5 years prior - + increased pulse, decreased BP - only on prn Propranolol  - + anxiety - Xanax  - + dizziness - especially with raising hands and washing hair, for e.g. Or when exercising with elastic bands at [employer] - + palpitations - + lightheaded before meals and ~1.5 hours post meals, but mostly after Propranolol  - + spider veins on legs have increased - +  bloated and occasionally RLQ pain - she feels unwell, cannot describe better, thinks she may be depressed, too.   At that time, her HbA1c was 5.9%, slightly high x2 giving her a diagnosis of prediabetes.  If her meter at that time to check blood sugars whenever feeling dizzy to check for reactive hypoglycemia. ESR was very slightly elevated. Thyroid  tests, CBC, CMP, 5 HIAA (for carcinoid), celiac blood workup, cortisol level were all normal. She saw cardiology: ruled out for POTS, and she had normal 2-D echo and stress test. She did have a 24 HR monitor that showed, PVCs and several runs of NSVT. She also has a history of MVP.  Patient also has a history of aortic regurgitation, osteopenia, GERD, skin cancer  ROS: + see HPI  Past Medical History:  Diagnosis Date   Allergy    Anxiety    Aortic regurgitation    moderate by echo 10/2020   Arthritis    Cancer (HCC)    skin   Cataract    bilateral - MD is just watching    Diverticulosis    Fuchs' endothelial dystrophy    follows with optho regularly    Gestational diabetes    Heart murmur    MVP    History of depression    HSV-2 infection    Hyperlipidemia    Hyperplastic colon polyp    Mitral valve prolapse    mild to moderate MR by echo 10/2020   PVC's (premature ventricular contractions)    intol of BB, sxc palpitations r/t stress   RVOT ventricular tachycardia/PVCs    EP eval 01/2013 for freq PVCs   Past Surgical History:  Procedure Laterality Date   CESAREAN SECTION     x 3   COLONOSCOPY     KNEE SURGERY Right    MANDIBLE SURGERY     right side in front of ear   MOHS SURGERY  2021   POLYPECTOMY     WISDOM TOOTH EXTRACTION     Social History   Socioeconomic History   Marital status: Married    Spouse name: Not on file   Number of children: 3   Years of education: Not on file   Highest education level: Some college, no degree  Occupational History   Not on file  Tobacco Use   Smoking status: Former     Current packs/day: 0.00    Average packs/day: 0.3 packs/day for 5.0 years (1.3 ttl pk-yrs)    Types: Cigarettes    Quit date: 06/15/1983    Years since quitting: 40.8    Passive exposure: Past   Smokeless tobacco: Never   Tobacco comments:    quit January 1983 42 years ago  less than pk/week  Vaping Use   Vaping status: Never Used  Substance and Sexual Activity   Alcohol use: Not Currently  Comment: occasional   Drug use: No   Sexual activity: Not Currently    Birth control/protection: Post-menopausal  Other Topics Concern   Not on file  Social History Narrative   Regular exercise: yes   Caffeine use: none   Social Drivers of Corporate investment banker Strain: Low Risk  (11/10/2023)   Overall Financial Resource Strain (CARDIA)    Difficulty of Paying Living Expenses: Not hard at all  Food Insecurity: No Food Insecurity (11/10/2023)   Hunger Vital Sign    Worried About Running Out of Food in the Last Year: Never true    Ran Out of Food in the Last Year: Never true  Transportation Needs: No Transportation Needs (11/10/2023)   PRAPARE - Administrator, Civil Service (Medical): No    Lack of Transportation (Non-Medical): No  Physical Activity: Insufficiently Active (11/10/2023)   Exercise Vital Sign    Days of Exercise per Week: 2 days    Minutes of Exercise per Session: 30 min  Stress: No Stress Concern Present (11/10/2023)   [employer] of Occupational Health - Occupational Stress Questionnaire    Feeling of Stress : Only a little  Social Connections: Moderately Integrated (11/10/2023)   Social Connection and Isolation Panel    Frequency of Communication with Friends and Family: More than three times a week    Frequency of Social Gatherings with Friends and Family: Once a week    Attends Religious Services: Never    Database administrator or Organizations: Yes    Attends Banker Meetings: 1 to 4 times per year    Marital Status: Married   Catering manager Violence: Not At Risk (11/10/2023)   Humiliation, Afraid, Rape, and Kick questionnaire    Fear of Current or Ex-Partner: No    Emotionally Abused: No    Physically Abused: No    Sexually Abused: No   Current Outpatient Medications on File Prior to Visit  Medication Sig Dispense Refill   ALPRAZolam  (XANAX ) 0.25 MG tablet Take 1 tablet (0.25 mg total) by mouth daily as needed for anxiety. 20 tablet 0   ascorbic acid (VITAMIN C) 500 MG tablet Take 500 mg by mouth every 3 (three) days. [phone] MG     b complex vitamins capsule Take 1 capsule by mouth every 3 (three) days.     Cholecalciferol (VITAMIN D -3) 125 MCG (5000 UT) TABS Take 1 tablet by mouth every 3 (three) days.     Continuous Glucose Sensor (FREESTYLE LIBRE 3 PLUS SENSOR) MISC 1 each by Does not apply route every 14 (fourteen) days. 6 each 3   loratadine (CLARITIN) 10 MG tablet Take 10 mg by mouth daily as needed for allergies.     MAGNESIUM GLUCONATE PO Take 400 mg by mouth daily.     propranolol  (INDERAL ) 10 MG tablet Take 10 mg by mouth as needed (increased HR and skipped beats).     rosuvastatin  (CRESTOR ) 5 MG tablet TAKE 1 TABLET (5 MG TOTAL) BY MOUTH DAILY. 90 tablet 3   tirzepatide  (MOUNJARO ) 2.5 MG/0.5ML Pen Inject 2.5 mg into the skin once a week. 2 mL 1   tirzepatide  (MOUNJARO ) 5 MG/0.5ML Pen Inject 5 mg into the skin once a week. 6 mL 0   tirzepatide  (MOUNJARO ) 7.5 MG/0.5ML Pen Inject 7.5 mg into the skin once a week. 2 mL 0   No current facility-administered medications on file prior to visit.   Allergies  Allergen Reactions   Latex Swelling  Other     Grass, local trees, cats and dogs   Pollen Extract    Family History  Problem Relation Age of Onset   Colon cancer Mother        dx'd in her 86's-- stage 2    Cancer Mother    Miscarriages / [place] Mother    Hypertension Father    Heart disease Father    Hyperlipidemia Father    COPD Father    Thyroid  disease Sister    Colon polyps  Sister    Colon polyps Sister    Colon cancer Maternal Grandmother    Heart disease Maternal Grandmother    Colon polyps Daughter    Alcohol abuse Other    Rectal cancer Neg Hx    Stomach cancer Neg Hx    Esophageal cancer Neg Hx    PE: BP 120/80   Pulse (!) 104   Ht 5' 4 (1.626 m)   Wt 161 lb 12.8 oz (73.4 kg)   SpO2 97%   BMI 27.77 kg/m  Wt Readings from Last 10 Encounters:  03/27/24 161 lb 12.8 oz (73.4 kg)  03/14/24 164 lb (74.4 kg)  12/13/23 173 lb (78.5 kg)  11/11/23 175 lb (79.4 kg)  11/10/23 176 lb (79.8 kg)  09/23/23 176 lb (79.8 kg)  08/12/23 170 lb (77.1 kg)  08/10/23 175 lb (79.4 kg)  06/09/23 171 lb 12.8 oz (77.9 kg)  06/02/23 173 lb (78.5 kg)   Constitutional: overweight, in NAD Eyes:  EOMI, no exophthalmos ENT: no neck masses, no cervical lymphadenopathy Cardiovascular: Tachycardia, RR, No MRG Respiratory: CTA B Musculoskeletal: no deformities Skin:no rashes Neurological: no tremor with outstretched hands Diabetic Foot Exam - Simple   Simple Foot Form Diabetic Foot exam was performed with the following findings: Yes 03/27/2024 10:37 AM  Visual Inspection No deformities, no ulcerations, no other skin breakdown bilaterally: Yes Sensation Testing Intact to touch and monofilament testing bilaterally: Yes Pulse Check Posterior Tibialis and Dorsalis pulse intact bilaterally: Yes Comments    ASSESSMENT: 1. DM2, diet controlled, without complications  2. HL  PLAN:  1. Patient with relatively recent diagnosis of type 2 diabetes, previously diet controlled, currently on Mounjaro .  Latest HbA1c is from 09/23/2023 and this was 6.7%, at goal, slightly higher than 6.4% obtained in 05/2023.  She is exercising consistently and paying attention to her diet.  She continues on the CGM.  At last visit, sugars were minimally fluctuating, mostly within the target range, with higher blood sugars during the night, likely after snack/dinner.  She did describe instances  in which the sugars were increasing significantly after a meal and then dropping abruptly.  We discussed that this was not unusual with some of the meals and I advised her to try to start the meal with protein, fiber, and fat and and with carbs.  I also recommended to eat slower.  She was also over correcting blood sugars and I strongly advised her not to do so. CGM interpretation: -At today's visit, we reviewed her CGM downloads: It appears that 99% of values are in target range (goal >70%), while 0% are higher than 180 (goal <25%), and 1% are lower than 70 (goal <4%).  The calculated average blood sugar is 101.  The projected HbA1c for the next 3 months (GMI) is 5.7%. -Reviewing the CGM trends, sugars appear to be excellent, fluctuating within the target range, with only occasional hyperglycemic peaks after a high glycemic index food or after pizza.  She continues  on Mounjaro , which was started by PCP for weight loss.  Indeed, she lost 15 pounds since she started it.  She tolerates it fairly well, with only some constipation, which is helped by fiber Gummies.  No other intervention needed for now.  We did discuss about trying to avoid store-bought sweets, like muffins, which she noticed that greatly raised her blood sugars. - I suggested to:  Patient Instructions  Please continue Mounjaro  5 mg weekly.  Please come back in 6 months.  - we checked her HbA1c: 6.1% (lower) - advised to check sugars at different times of the day - 4x a day, rotating check times - advised for yearly eye exams >> she is UTD - return to clinic in 6 months  2. HL - Reviewed latest panel from 10/2023: LDL above target, but much improved from baseline, otherwise fractions at goal: Lab Results  Component Value Date   CHOL 219 (H) 11/11/2023   HDL 82.80 11/11/2023   LDLCALC 119 (H) 11/11/2023   LDLDIRECT 168.0 01/02/2018   TRIG 83.0 11/11/2023   CHOLHDL 3 11/11/2023  - She is on Crestor  5 mg daily with good  tolerance  Lela Fendt, MD PhD [facility]

## 2024-03-27 NOTE — Addendum Note (Signed)
 Addended by: CLEOTILDE ROLIN RAMAN on: 03/27/2024 10:56 AM   Modules accepted: Orders

## 2024-03-27 NOTE — Patient Instructions (Addendum)
 Please continue Mounjaro  5 mg weekly.  Please come back in 6 months.

## 2024-04-02 ENCOUNTER — Other Ambulatory Visit: Payer: Self-pay | Admitting: Internal Medicine

## 2024-06-13 NOTE — Progress Notes (Deleted)
 " [facility] [address] [place] [address] Phone: [phone] Subjective:    I'm seeing this patient by the request  of:  Rollene Almarie LABOR, MD  CC:   YEP:Dlagzrupcz  03/14/2024 Known arthritic changes and will continue to monitor.  Not do well with physical therapy we will consider to get out of start with home exercises initially.     Nontraumatic.  Noted, discussed with patient that there is some underlying arthritis that is likely also contributing.  Discussed with patient this regimen and home exercises, which activities to be slowly.  Discussed with patient strengthening.  Follow-up with me again in 6 to 8 weeks.  Concern of some underlying arthritic changes and likely could be contributing as well.     Update 06/20/2023 Jean Smith is a 73 y.o. female coming in with complaint of L shoulder and R lower leg pain. Patient states       Past Medical History:  Diagnosis Date   Allergy    Anxiety    Aortic regurgitation    moderate by echo 10/2020   Arthritis    Cancer (HCC)    skin   Cataract    bilateral - MD is just watching    Diverticulosis    Fuchs' endothelial dystrophy    follows with optho regularly    Gestational diabetes    Heart murmur    MVP    History of depression    HSV-2 infection    Hyperlipidemia    Hyperplastic colon polyp    Mitral valve prolapse    mild to moderate MR by echo 10/2020   PVC's (premature ventricular contractions)    intol of BB, sxc palpitations r/t stress   RVOT ventricular tachycardia/PVCs    EP eval 01/2013 for freq PVCs   Past Surgical History:  Procedure Laterality Date   CESAREAN SECTION     x 3   COLONOSCOPY     KNEE SURGERY Right    MANDIBLE SURGERY     right side in front of ear   MOHS SURGERY  2021   POLYPECTOMY     WISDOM TOOTH EXTRACTION     Social History   Socioeconomic History   Marital status: Married    Spouse name: Not on file   Number of children: 3    Years of education: Not on file   Highest education level: Some college, no degree  Occupational History   Not on file  Tobacco Use   Smoking status: Former    Current packs/day: 0.00    Average packs/day: 0.3 packs/day for 5.0 years (1.3 ttl pk-yrs)    Types: Cigarettes    Quit date: 06/15/1983    Years since quitting: 41.0    Passive exposure: Past   Smokeless tobacco: Never   Tobacco comments:    quit January 1983 42 years ago  less than pk/week  Vaping Use   Vaping status: Never Used  Substance and Sexual Activity   Alcohol use: Not Currently    Comment: occasional   Drug use: No   Sexual activity: Not Currently    Birth control/protection: Post-menopausal  Other Topics Concern   Not on file  Social History Narrative   Regular exercise: yes   Caffeine use: none   Social Drivers of Health   Tobacco Use: Medium Risk (03/27/2024)   Patient History    Smoking Tobacco Use: Former    Smokeless Tobacco Use: Never  Passive Exposure: Past  Physicist, Medical Strain: Low Risk (11/10/2023)   Overall Financial Resource Strain (CARDIA)    Difficulty of Paying Living Expenses: Not hard at all  Food Insecurity: No Food Insecurity (11/10/2023)   Hunger Vital Sign    Worried About Running Out of Food in the Last Year: Never true    Ran Out of Food in the Last Year: Never true  Transportation Needs: No Transportation Needs (11/10/2023)   PRAPARE - Administrator, Civil Service (Medical): No    Lack of Transportation (Non-Medical): No  Physical Activity: Insufficiently Active (11/10/2023)   Exercise Vital Sign    Days of Exercise per Week: 2 days    Minutes of Exercise per Session: 30 min  Stress: No Stress Concern Present (11/10/2023)   [employer] of Occupational Health - Occupational Stress Questionnaire    Feeling of Stress : Only a little  Social Connections: Moderately Integrated (11/10/2023)   Social Connection and Isolation Panel    Frequency of  Communication with Friends and Family: More than three times a week    Frequency of Social Gatherings with Friends and Family: Once a week    Attends Religious Services: Never    Database Administrator or Organizations: Yes    Attends Banker Meetings: 1 to 4 times per year    Marital Status: Married  Depression (PHQ2-9): Low Risk (11/10/2023)   Depression (PHQ2-9)    PHQ-2 Score: 2  Alcohol Screen: Low Risk (11/10/2023)   Alcohol Screen    Last Alcohol Screening Score (AUDIT): 0  Housing: Low Risk (11/10/2023)   Housing Stability Vital Sign    Unable to Pay for Housing in the Last Year: No    Number of Times Moved in the Last Year: 1    Homeless in the Last Year: No  Utilities: Not At Risk (11/10/2023)   AHC Utilities    Threatened with loss of utilities: No  Health Literacy: Adequate Health Literacy (11/10/2023)   B1300 Health Literacy    Frequency of need for help with medical instructions: Never   Allergies[1] Family History  Problem Relation Age of Onset   Colon cancer Mother        dx'd in her 55's-- stage 2    Cancer Mother    Miscarriages / Stillbirths Mother    Hypertension Father    Heart disease Father    Hyperlipidemia Father    COPD Father    Thyroid  disease Sister    Colon polyps Sister    Colon polyps Sister    Colon cancer Maternal Grandmother    Heart disease Maternal Grandmother    Colon polyps Daughter    Alcohol abuse Other    Rectal cancer Neg Hx    Stomach cancer Neg Hx    Esophageal cancer Neg Hx     Current Outpatient Medications (Endocrine & Metabolic):    tirzepatide  (MOUNJARO ) 2.5 MG/0.5ML Pen, Inject 2.5 mg into the skin once a week.   tirzepatide  (MOUNJARO ) 5 MG/0.5ML Pen, INJECT 5 MG SUBCUTANEOUSLY WEEKLY   tirzepatide  (MOUNJARO ) 7.5 MG/0.5ML Pen, Inject 7.5 mg into the skin once a week.  Current Outpatient Medications (Cardiovascular):    propranolol  (INDERAL ) 10 MG tablet, Take 10 mg by mouth as needed (increased HR and  skipped beats). (Patient not taking: Reported on 03/27/2024)   rosuvastatin  (CRESTOR ) 5 MG tablet, TAKE 1 TABLET (5 MG TOTAL) BY MOUTH DAILY. (Patient not taking: Reported on 03/27/2024)  Current Outpatient Medications (  Respiratory):    loratadine (CLARITIN) 10 MG tablet, Take 10 mg by mouth daily as needed for allergies.  Current Outpatient Medications (Other):    ALPRAZolam  (XANAX ) 0.25 MG tablet, Take 1 tablet (0.25 mg total) by mouth daily as needed for anxiety.   ascorbic acid (VITAMIN C) 500 MG tablet, Take 500 mg by mouth every 3 (three) days. [phone] MG   b complex vitamins capsule, Take 1 capsule by mouth every 3 (three) days.   Cholecalciferol (VITAMIN D -3) 125 MCG (5000 UT) TABS, Take 1 tablet by mouth every 3 (three) days.   Continuous Glucose Sensor (FREESTYLE LIBRE 3 PLUS SENSOR) MISC, 1 each by Does not apply route every 14 (fourteen) days.   MAGNESIUM GLUCONATE PO, Take 400 mg by mouth daily.   Reviewed prior external information including notes and imaging from  primary care provider As well as notes that were available from care everywhere and other healthcare systems.  Past medical history, social, surgical and family history all reviewed in electronic medical record.  No pertanent information unless stated regarding to the chief complaint.   Review of Systems:  No headache, visual changes, nausea, vomiting, diarrhea, constipation, dizziness, abdominal pain, skin rash, fevers, chills, night sweats, weight loss, swollen lymph nodes, body aches, joint swelling, chest pain, shortness of breath, mood changes. POSITIVE muscle aches  Objective  There were no vitals taken for this visit.   General: No apparent distress alert and oriented x3 mood and affect normal, dressed appropriately.  HEENT: Pupils equal, extraocular movements intact  Respiratory: Patient's speak in full sentences and does not appear short of breath  Cardiovascular: No lower extremity edema, non tender,  no erythema      Impression and Recommendations:           [1]  Allergies Allergen Reactions   Latex Swelling   Other     Grass, local trees, cats and dogs   Pollen Extract    "

## 2024-06-19 ENCOUNTER — Ambulatory Visit: Admitting: Family Medicine

## 2024-07-06 ENCOUNTER — Other Ambulatory Visit: Payer: Self-pay | Admitting: Internal Medicine

## 2024-07-20 NOTE — Progress Notes (Unsigned)
 " [facility] [address] [place] [address] Phone: [phone] Subjective:    I'm seeing this patient by the request  of:  Rollene Almarie LABOR, MD  CC:   YEP:Dlagzrupcz  03/14/2024 Stable after injection and will continue to monitor no changes in management at this time     Known arthritic changes and will continue to monitor.  Not do well with physical therapy we will consider to get out of start with home exercises initially.     Nontraumatic.  Noted, discussed with patient that there is some underlying arthritis that is likely also contributing.  Discussed with patient this regimen and home exercises, which activities to be slowly.  Discussed with patient strengthening.  Follow-up with me again in 6 to 8 weeks.  Concern of some underlying arthritic changes and likely could be contributing as well     Update 07/24/2024 DESTA BUJAK is a 74 y.o. female coming in with complaint of L shoulder and R lower leg pain. Patient states       Past Medical History:  Diagnosis Date   Allergy    Anxiety    Aortic regurgitation    moderate by echo 10/2020   Arthritis    Cancer (HCC)    skin   Cataract    bilateral - MD is just watching    Diverticulosis    Fuchs' endothelial dystrophy    follows with optho regularly    Gestational diabetes    Heart murmur    MVP    History of depression    HSV-2 infection    Hyperlipidemia    Hyperplastic colon polyp    Mitral valve prolapse    mild to moderate MR by echo 10/2020   PVC's (premature ventricular contractions)    intol of BB, sxc palpitations r/t stress   RVOT ventricular tachycardia/PVCs    EP eval 01/2013 for freq PVCs   Past Surgical History:  Procedure Laterality Date   CESAREAN SECTION     x 3   COLONOSCOPY     KNEE SURGERY Right    MANDIBLE SURGERY     right side in front of ear   MOHS SURGERY  2021   POLYPECTOMY     WISDOM TOOTH EXTRACTION     Social History    Socioeconomic History   Marital status: Married    Spouse name: Not on file   Number of children: 3   Years of education: Not on file   Highest education level: Some college, no degree  Occupational History   Not on file  Tobacco Use   Smoking status: Former    Current packs/day: 0.00    Average packs/day: 0.3 packs/day for 5.0 years (1.3 ttl pk-yrs)    Types: Cigarettes    Quit date: 06/15/1983    Years since quitting: 41.1    Passive exposure: Past   Smokeless tobacco: Never   Tobacco comments:    quit January 1983 42 years ago  less than pk/week  Vaping Use   Vaping status: Never Used  Substance and Sexual Activity   Alcohol use: Not Currently    Comment: occasional   Drug use: No   Sexual activity: Not Currently    Birth control/protection: Post-menopausal  Other Topics Concern   Not on file  Social History Narrative   Regular exercise: yes   Caffeine use: none   Social Drivers of Health   Tobacco Use: Medium Risk (03/27/2024)  Patient History    Smoking Tobacco Use: Former    Smokeless Tobacco Use: Never    Passive Exposure: Past  Physicist, Medical Strain: Low Risk (11/10/2023)   Overall Financial Resource Strain (CARDIA)    Difficulty of Paying Living Expenses: Not hard at all  Food Insecurity: No Food Insecurity (11/10/2023)   Hunger Vital Sign    Worried About Running Out of Food in the Last Year: Never true    Ran Out of Food in the Last Year: Never true  Transportation Needs: No Transportation Needs (11/10/2023)   PRAPARE - Administrator, Civil Service (Medical): No    Lack of Transportation (Non-Medical): No  Physical Activity: Insufficiently Active (11/10/2023)   Exercise Vital Sign    Days of Exercise per Week: 2 days    Minutes of Exercise per Session: 30 min  Stress: No Stress Concern Present (11/10/2023)   [employer] of Occupational Health - Occupational Stress Questionnaire    Feeling of Stress : Only a little  Social  Connections: Moderately Integrated (11/10/2023)   Social Connection and Isolation Panel    Frequency of Communication with Friends and Family: More than three times a week    Frequency of Social Gatherings with Friends and Family: Once a week    Attends Religious Services: Never    Database Administrator or Organizations: Yes    Attends Banker Meetings: 1 to 4 times per year    Marital Status: Married  Depression (PHQ2-9): Low Risk (11/10/2023)   Depression (PHQ2-9)    PHQ-2 Score: 2  Alcohol Screen: Low Risk (11/10/2023)   Alcohol Screen    Last Alcohol Screening Score (AUDIT): 0  Housing: Low Risk (11/10/2023)   Housing Stability Vital Sign    Unable to Pay for Housing in the Last Year: No    Number of Times Moved in the Last Year: 1    Homeless in the Last Year: No  Utilities: Not At Risk (11/10/2023)   AHC Utilities    Threatened with loss of utilities: No  Health Literacy: Adequate Health Literacy (11/10/2023)   B1300 Health Literacy    Frequency of need for help with medical instructions: Never   Allergies[1] Family History  Problem Relation Age of Onset   Colon cancer Mother        dx'd in her 73's-- stage 2    Cancer Mother    Miscarriages / Stillbirths Mother    Hypertension Father    Heart disease Father    Hyperlipidemia Father    COPD Father    Thyroid  disease Sister    Colon polyps Sister    Colon polyps Sister    Colon cancer Maternal Grandmother    Heart disease Maternal Grandmother    Colon polyps Daughter    Alcohol abuse Other    Rectal cancer Neg Hx    Stomach cancer Neg Hx    Esophageal cancer Neg Hx     Current Outpatient Medications (Endocrine & Metabolic):    tirzepatide  (MOUNJARO ) 2.5 MG/0.5ML Pen, Inject 2.5 mg into the skin once a week.   tirzepatide  (MOUNJARO ) 5 MG/0.5ML Pen, INJECT 5 MG SUBCUTANEOUSLY WEEKLY   tirzepatide  (MOUNJARO ) 7.5 MG/0.5ML Pen, Inject 7.5 mg into the skin once a week.  Current Outpatient Medications  (Cardiovascular):    propranolol  (INDERAL ) 10 MG tablet, Take 10 mg by mouth as needed (increased HR and skipped beats). (Patient not taking: Reported on 03/27/2024)   rosuvastatin  (CRESTOR ) 5 MG tablet,  TAKE 1 TABLET (5 MG TOTAL) BY MOUTH DAILY. (Patient not taking: Reported on 03/27/2024)  Current Outpatient Medications (Respiratory):    loratadine (CLARITIN) 10 MG tablet, Take 10 mg by mouth daily as needed for allergies.  Current Outpatient Medications (Other):    ALPRAZolam  (XANAX ) 0.25 MG tablet, TAKE 1 TABLET BY MOUTH DAILY AS NEEDED FOR ANXIETY   ascorbic acid (VITAMIN C) 500 MG tablet, Take 500 mg by mouth every 3 (three) days. [id number] MG   b complex vitamins capsule, Take 1 capsule by mouth every 3 (three) days.   Cholecalciferol (VITAMIN D -3) 125 MCG (5000 UT) TABS, Take 1 tablet by mouth every 3 (three) days.   Continuous Glucose Sensor (FREESTYLE LIBRE 3 PLUS SENSOR) MISC, 1 each by Does not apply route every 14 (fourteen) days.   MAGNESIUM GLUCONATE PO, Take 400 mg by mouth daily.   Reviewed prior external information including notes and imaging from  primary care provider As well as notes that were available from care everywhere and other healthcare systems.  Past medical history, social, surgical and family history all reviewed in electronic medical record.  No pertanent information unless stated regarding to the chief complaint.   Review of Systems:  No headache, visual changes, nausea, vomiting, diarrhea, constipation, dizziness, abdominal pain, skin rash, fevers, chills, night sweats, weight loss, swollen lymph nodes, body aches, joint swelling, chest pain, shortness of breath, mood changes. POSITIVE muscle aches  Objective  There were no vitals taken for this visit.   General: No apparent distress alert and oriented x3 mood and affect normal, dressed appropriately.  HEENT: Pupils equal, extraocular movements intact  Respiratory: Patient's speak in full sentences and  does not appear short of breath  Cardiovascular: No lower extremity edema, non tender, no erythema      Impression and Recommendations:           [1]  Allergies Allergen Reactions   Latex Swelling   Other     Grass, local trees, cats and dogs   Pollen Extract    "

## 2024-07-24 ENCOUNTER — Ambulatory Visit: Admitting: Family Medicine

## 2024-09-25 ENCOUNTER — Ambulatory Visit: Admitting: Internal Medicine

## 2024-11-19 ENCOUNTER — Encounter: Admitting: Internal Medicine

## 2024-11-19 ENCOUNTER — Ambulatory Visit
